# Patient Record
Sex: Female | Born: 1960 | Race: White | Hispanic: No | Smoking: Never smoker
Health system: Southern US, Community
[De-identification: ages and names within clinical notes are randomized; demographics above are authoritative.]

## PROBLEM LIST (undated history)

## (undated) DIAGNOSIS — C50919 Malignant neoplasm of unspecified site of unspecified female breast: Secondary | ICD-10-CM

## (undated) DIAGNOSIS — N200 Calculus of kidney: Secondary | ICD-10-CM

## (undated) DIAGNOSIS — M25561 Pain in right knee: Secondary | ICD-10-CM

## (undated) DIAGNOSIS — K8020 Calculus of gallbladder without cholecystitis without obstruction: Secondary | ICD-10-CM

## (undated) DIAGNOSIS — D6851 Activated protein C resistance: Secondary | ICD-10-CM

## (undated) DIAGNOSIS — M1990 Unspecified osteoarthritis, unspecified site: Secondary | ICD-10-CM

## (undated) DIAGNOSIS — E7800 Pure hypercholesterolemia, unspecified: Secondary | ICD-10-CM

## (undated) DIAGNOSIS — K5900 Constipation, unspecified: Secondary | ICD-10-CM

## (undated) DIAGNOSIS — F99 Mental disorder, not otherwise specified: Secondary | ICD-10-CM

## (undated) DIAGNOSIS — Z8041 Family history of malignant neoplasm of ovary: Secondary | ICD-10-CM

## (undated) DIAGNOSIS — F319 Bipolar disorder, unspecified: Secondary | ICD-10-CM

## (undated) DIAGNOSIS — R011 Cardiac murmur, unspecified: Secondary | ICD-10-CM

## (undated) DIAGNOSIS — L409 Psoriasis, unspecified: Secondary | ICD-10-CM

## (undated) DIAGNOSIS — Z853 Personal history of malignant neoplasm of breast: Secondary | ICD-10-CM

## (undated) DIAGNOSIS — R87619 Unspecified abnormal cytological findings in specimens from cervix uteri: Secondary | ICD-10-CM

## (undated) DIAGNOSIS — M25562 Pain in left knee: Secondary | ICD-10-CM

## (undated) DIAGNOSIS — Z170 Estrogen receptor positive status [ER+]: Secondary | ICD-10-CM

## (undated) DIAGNOSIS — I10 Essential (primary) hypertension: Secondary | ICD-10-CM

## (undated) DIAGNOSIS — Z8742 Personal history of other diseases of the female genital tract: Secondary | ICD-10-CM

## (undated) DIAGNOSIS — IMO0002 Reserved for concepts with insufficient information to code with codable children: Secondary | ICD-10-CM

## (undated) DIAGNOSIS — N649 Disorder of breast, unspecified: Secondary | ICD-10-CM

## (undated) DIAGNOSIS — D219 Benign neoplasm of connective and other soft tissue, unspecified: Secondary | ICD-10-CM

## (undated) DIAGNOSIS — R87629 Unspecified abnormal cytological findings in specimens from vagina: Secondary | ICD-10-CM

## (undated) DIAGNOSIS — K760 Fatty (change of) liver, not elsewhere classified: Principal | ICD-10-CM

## (undated) DIAGNOSIS — K219 Gastro-esophageal reflux disease without esophagitis: Secondary | ICD-10-CM

## (undated) DIAGNOSIS — Z7901 Long term (current) use of anticoagulants: Secondary | ICD-10-CM

## (undated) DIAGNOSIS — I82629 Acute embolism and thrombosis of deep veins of unspecified upper extremity: Secondary | ICD-10-CM

## (undated) DIAGNOSIS — D689 Coagulation defect, unspecified: Principal | ICD-10-CM

## (undated) HISTORY — DX: Cardiac murmur, unspecified: R01.1

## (undated) HISTORY — DX: Acute embolism and thrombosis of deep veins of unspecified upper extremity: I82.629

## (undated) HISTORY — DX: Unspecified abnormal cytological findings in specimens from cervix uteri: R87.619

## (undated) HISTORY — DX: Gastro-esophageal reflux disease without esophagitis: K21.9

## (undated) HISTORY — DX: Mental disorder, not otherwise specified: F99

## (undated) HISTORY — DX: Calculus of kidney: N20.0

## (undated) HISTORY — DX: Personal history of other diseases of the female genital tract: Z87.42

## (undated) HISTORY — DX: Essential (primary) hypertension: I10

## (undated) HISTORY — DX: Malignant neoplasm of unspecified site of unspecified female breast: C50.919

## (undated) HISTORY — DX: Pain in right knee: M25.561

## (undated) HISTORY — DX: Coagulation defect, unspecified: D68.9

## (undated) HISTORY — DX: Calculus of gallbladder without cholecystitis without obstruction: K80.20

## (undated) HISTORY — DX: Psoriasis, unspecified: L40.9

## (undated) HISTORY — PX: MASTECTOMY: SHX3

## (undated) HISTORY — PX: BREAST LUMPECTOMY: SHX2

## (undated) HISTORY — DX: Personal history of malignant neoplasm of breast: Z85.3

## (undated) HISTORY — DX: Long term (current) use of anticoagulants: Z79.01

## (undated) HISTORY — DX: Bipolar disorder, unspecified: F31.9

## (undated) HISTORY — DX: Disorder of breast, unspecified: N64.9

## (undated) HISTORY — DX: Unspecified osteoarthritis, unspecified site: M19.90

## (undated) HISTORY — PX: TONSILLECTOMY AND ADENOIDECTOMY: SHX28

## (undated) HISTORY — DX: Constipation, unspecified: K59.00

## (undated) HISTORY — DX: Benign neoplasm of connective and other soft tissue, unspecified: D21.9

## (undated) HISTORY — DX: Pain in right knee: M25.562

## (undated) HISTORY — PX: LYMPH NODE BIOPSY: SHX201

## (undated) HISTORY — DX: Fatty (change of) liver, not elsewhere classified: K76.0

## (undated) HISTORY — DX: Estrogen receptor positive status (ER+): Z17.0

## (undated) HISTORY — DX: Activated protein C resistance: D68.51

## (undated) HISTORY — PX: OTHER SURGICAL HISTORY: SHX169

## (undated) HISTORY — DX: Pure hypercholesterolemia, unspecified: E78.00

## (undated) HISTORY — DX: Unspecified abnormal cytological findings in specimens from vagina: R87.629

## (undated) HISTORY — DX: Family history of malignant neoplasm of ovary: Z80.41

## (undated) HISTORY — DX: Reserved for concepts with insufficient information to code with codable children: IMO0002

---

## 2001-06-18 ENCOUNTER — Encounter: Payer: Self-pay | Admitting: Orthopaedic Surgery

## 2001-06-18 ENCOUNTER — Ambulatory Visit (HOSPITAL_COMMUNITY): Admission: RE | Admit: 2001-06-18 | Discharge: 2001-06-18 | Payer: Self-pay | Admitting: Orthopaedic Surgery

## 2005-04-14 ENCOUNTER — Ambulatory Visit (HOSPITAL_COMMUNITY): Admission: RE | Admit: 2005-04-14 | Discharge: 2005-04-14 | Payer: Self-pay | Admitting: Obstetrics and Gynecology

## 2006-04-27 ENCOUNTER — Encounter: Admission: RE | Admit: 2006-04-27 | Discharge: 2006-04-27 | Payer: Self-pay | Admitting: Rheumatology

## 2006-07-26 ENCOUNTER — Ambulatory Visit (HOSPITAL_COMMUNITY): Admission: RE | Admit: 2006-07-26 | Discharge: 2006-07-26 | Payer: Self-pay | Admitting: Obstetrics and Gynecology

## 2006-09-19 ENCOUNTER — Ambulatory Visit (HOSPITAL_BASED_OUTPATIENT_CLINIC_OR_DEPARTMENT_OTHER): Admission: RE | Admit: 2006-09-19 | Discharge: 2006-09-19 | Payer: Self-pay | Admitting: *Deleted

## 2006-09-19 ENCOUNTER — Encounter (INDEPENDENT_AMBULATORY_CARE_PROVIDER_SITE_OTHER): Payer: Self-pay | Admitting: Specialist

## 2007-06-25 ENCOUNTER — Encounter: Admission: RE | Admit: 2007-06-25 | Discharge: 2007-06-25 | Payer: Self-pay | Admitting: Obstetrics and Gynecology

## 2007-06-27 ENCOUNTER — Encounter: Admission: RE | Admit: 2007-06-27 | Discharge: 2007-06-27 | Payer: Self-pay | Admitting: Family Medicine

## 2007-06-27 ENCOUNTER — Encounter (INDEPENDENT_AMBULATORY_CARE_PROVIDER_SITE_OTHER): Payer: Self-pay | Admitting: Diagnostic Radiology

## 2007-07-03 ENCOUNTER — Encounter: Admission: RE | Admit: 2007-07-03 | Discharge: 2007-07-03 | Payer: Self-pay | Admitting: Obstetrics and Gynecology

## 2007-07-03 ENCOUNTER — Encounter: Admission: RE | Admit: 2007-07-03 | Discharge: 2007-07-03 | Payer: Self-pay | Admitting: Surgery

## 2007-07-09 ENCOUNTER — Ambulatory Visit (HOSPITAL_COMMUNITY): Payer: Self-pay | Admitting: Oncology

## 2007-07-11 ENCOUNTER — Ambulatory Visit (HOSPITAL_COMMUNITY): Admission: RE | Admit: 2007-07-11 | Discharge: 2007-07-11 | Payer: Self-pay | Admitting: Oncology

## 2007-07-11 ENCOUNTER — Encounter (HOSPITAL_COMMUNITY): Admission: RE | Admit: 2007-07-11 | Discharge: 2007-08-10 | Payer: Self-pay | Admitting: Oncology

## 2007-07-11 ENCOUNTER — Ambulatory Visit: Payer: Self-pay | Admitting: Internal Medicine

## 2007-07-15 ENCOUNTER — Ambulatory Visit (HOSPITAL_BASED_OUTPATIENT_CLINIC_OR_DEPARTMENT_OTHER): Admission: RE | Admit: 2007-07-15 | Discharge: 2007-07-15 | Payer: Self-pay | Admitting: Surgery

## 2007-07-29 ENCOUNTER — Emergency Department (HOSPITAL_COMMUNITY): Admission: EM | Admit: 2007-07-29 | Discharge: 2007-07-30 | Payer: Self-pay | Admitting: Emergency Medicine

## 2007-08-13 ENCOUNTER — Encounter (HOSPITAL_COMMUNITY): Admission: RE | Admit: 2007-08-13 | Discharge: 2007-09-12 | Payer: Self-pay | Admitting: Oncology

## 2007-08-27 ENCOUNTER — Ambulatory Visit: Payer: Self-pay | Admitting: Oncology

## 2007-08-28 ENCOUNTER — Ambulatory Visit (HOSPITAL_COMMUNITY): Payer: Self-pay | Admitting: Oncology

## 2007-08-29 ENCOUNTER — Ambulatory Visit (HOSPITAL_COMMUNITY): Admission: RE | Admit: 2007-08-29 | Discharge: 2007-08-29 | Payer: Self-pay | Admitting: Obstetrics and Gynecology

## 2007-09-13 ENCOUNTER — Encounter (HOSPITAL_COMMUNITY): Admission: RE | Admit: 2007-09-13 | Discharge: 2007-10-09 | Payer: Self-pay | Admitting: Oncology

## 2007-09-25 ENCOUNTER — Ambulatory Visit: Payer: Self-pay | Admitting: Cardiology

## 2007-12-02 ENCOUNTER — Ambulatory Visit (HOSPITAL_COMMUNITY): Admission: RE | Admit: 2007-12-02 | Discharge: 2007-12-02 | Payer: Self-pay | Admitting: Family Medicine

## 2007-12-23 ENCOUNTER — Ambulatory Visit: Admission: RE | Admit: 2007-12-23 | Discharge: 2008-03-22 | Payer: Self-pay | Admitting: Radiation Oncology

## 2008-05-08 ENCOUNTER — Observation Stay (HOSPITAL_COMMUNITY): Admission: EM | Admit: 2008-05-08 | Discharge: 2008-05-10 | Payer: Self-pay | Admitting: Emergency Medicine

## 2008-06-01 ENCOUNTER — Ambulatory Visit (HOSPITAL_COMMUNITY): Admission: RE | Admit: 2008-06-01 | Discharge: 2008-06-01 | Payer: Self-pay | Admitting: Family Medicine

## 2008-06-02 ENCOUNTER — Encounter (HOSPITAL_COMMUNITY): Admission: RE | Admit: 2008-06-02 | Discharge: 2008-07-02 | Payer: Self-pay | Admitting: Family Medicine

## 2008-08-18 IMAGING — RF DG FLUORO RM 1-60 MIN
15 series · 15 of 15 positions shown · non-contrast
Comparison: Chest radiographs 07/31/07 and CT of the chest 07/30/07.

CLINICAL DATA: Breast cancer.  Inability to access port-a-cath.  
 DIAGNOSTIC FLUOROSCOPY AND PORT-A-CATH INJECTION:

[Series 1: run · 1 of 1 slices shown (1 of 15)]
[im 1/1]
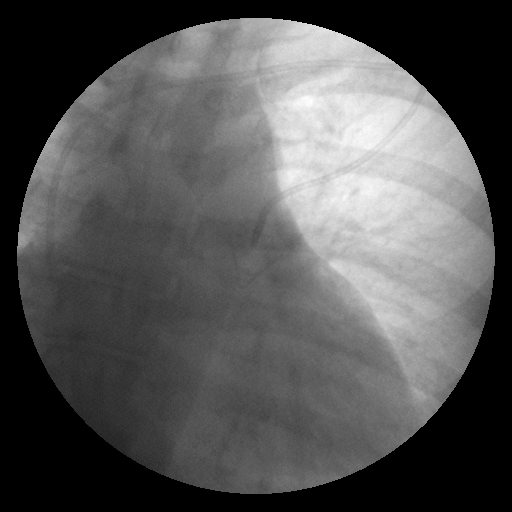

[Series 2: run · 1 of 1 slices shown (2 of 15)]
[im 1/1]
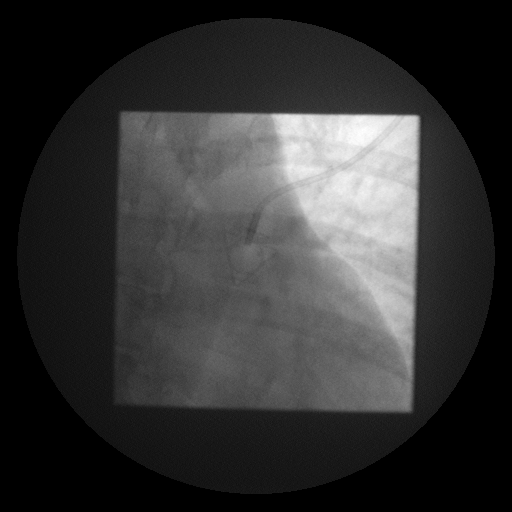

[Series 3: run · 1 of 1 slices shown (3 of 15)]
[im 1/1]
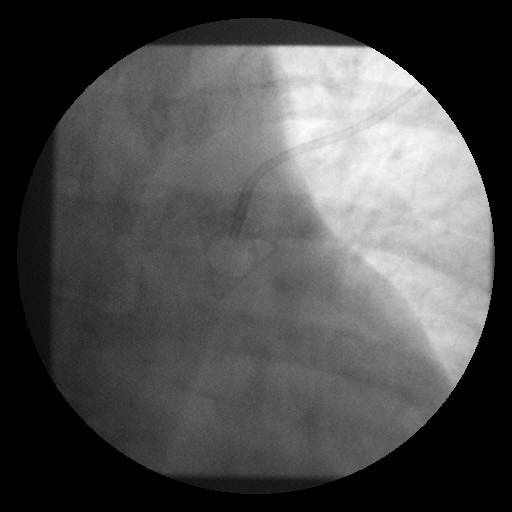

[Series 4: run · 1 of 1 slices shown (4 of 15)]
[im 1/1]
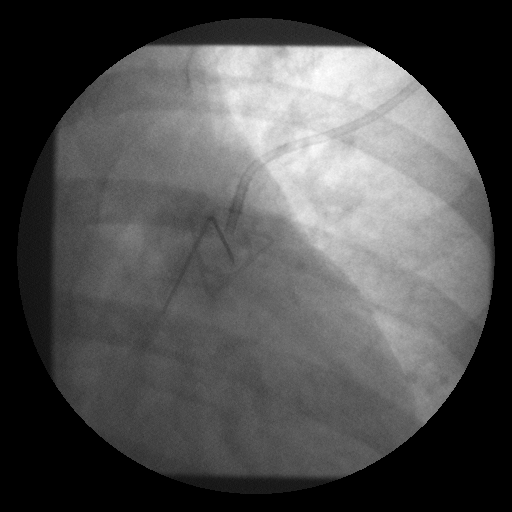

[Series 5: run · 1 of 1 slices shown (5 of 15)]
[im 1/1]
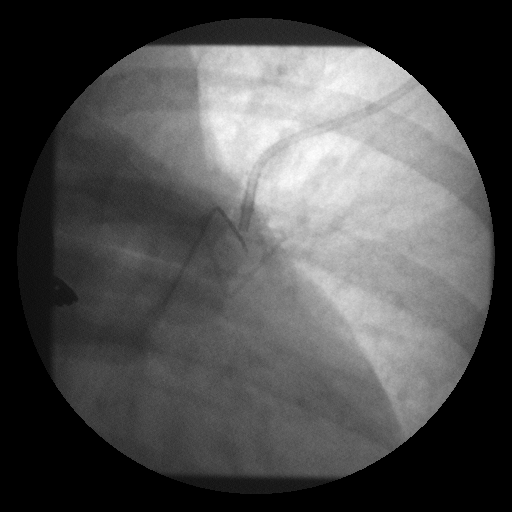

[Series 6: run · 1 of 1 slices shown (6 of 15)]
[im 1/1]
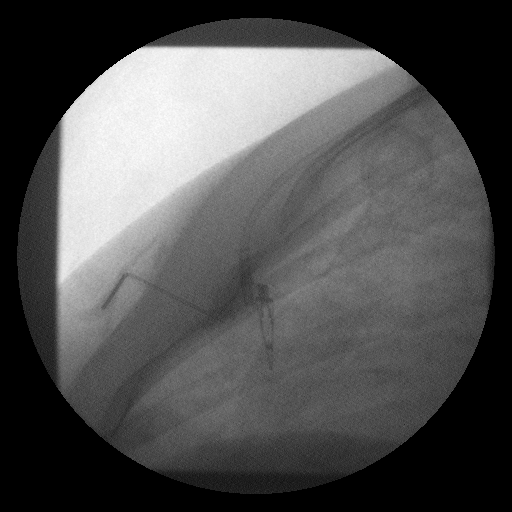

[Series 7: run · 1 of 1 slices shown (7 of 15)]
[im 1/1]
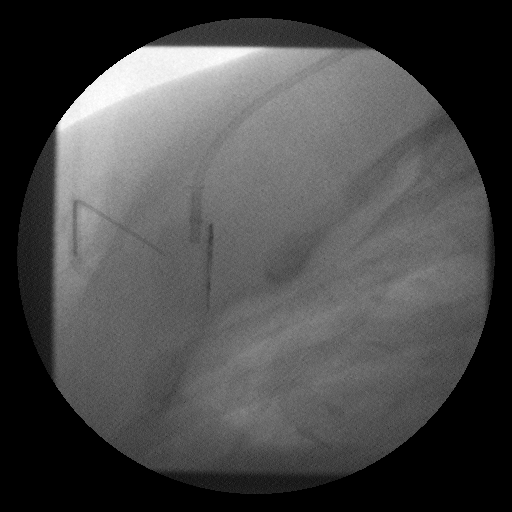

[Series 8: run · 1 of 1 slices shown (8 of 15)]
[im 1/1]
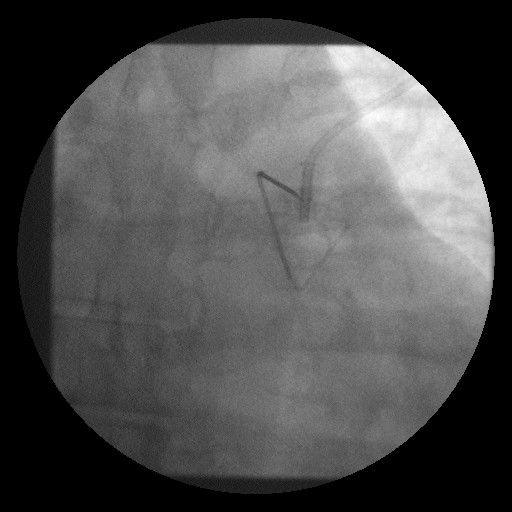

[Series 9: run · 1 of 1 slices shown (9 of 15)]
[im 1/1]
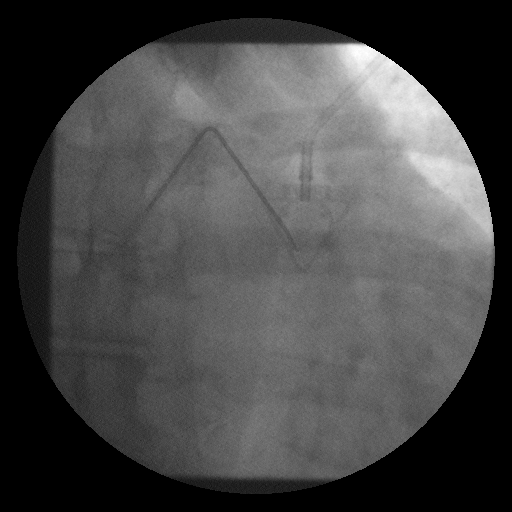

[Series 10: run · 1 of 1 slices shown (10 of 15)]
[im 1/1]
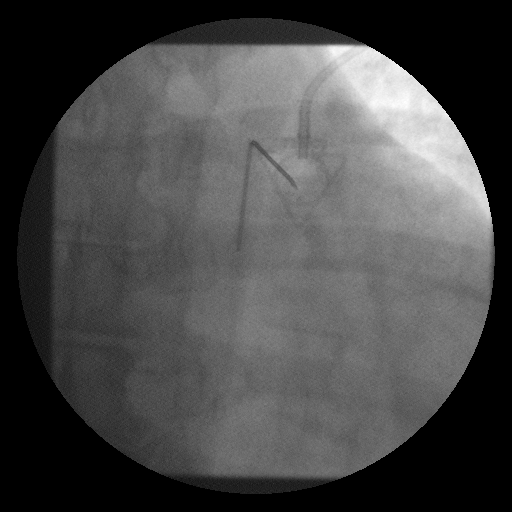

[Series 11: run · 1 of 1 slices shown (11 of 15)]
[im 1/1]
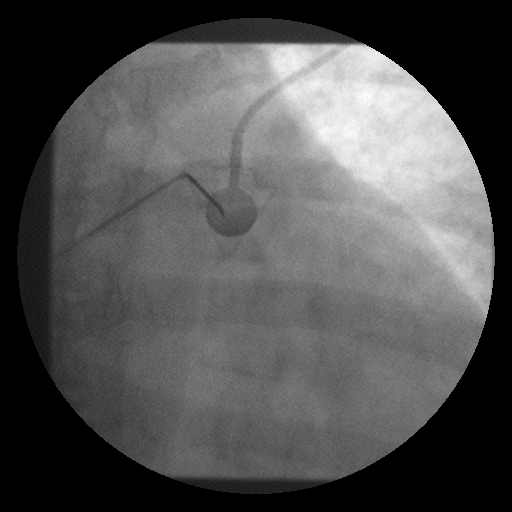

[Series 12: run · 1 of 1 slices shown (12 of 15)]
[im 1/1]
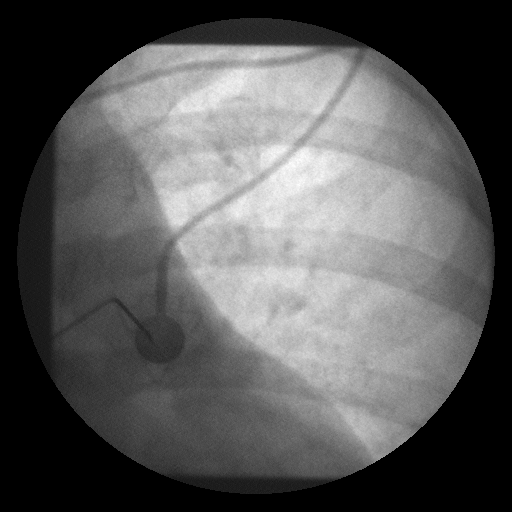

[Series 13: run · 1 of 1 slices shown (13 of 15)]
[im 1/1]
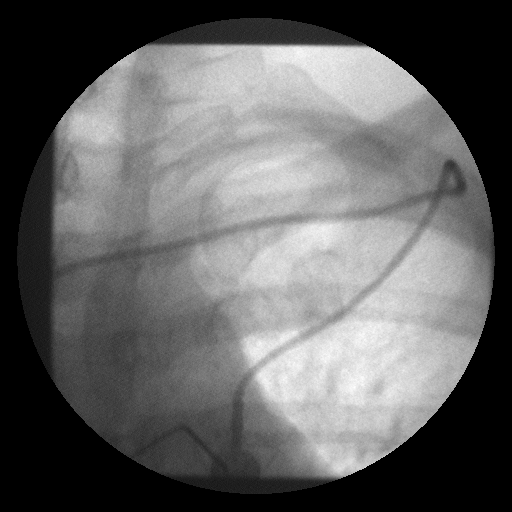

[Series 14: run · 1 of 1 slices shown (14 of 15)]
[im 1/1]
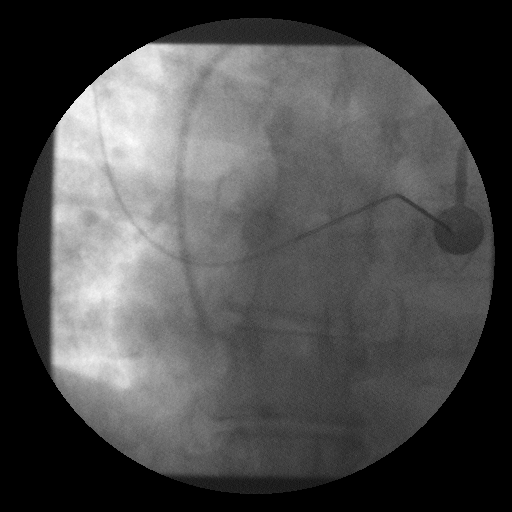

[Series 15: run · 1 of 1 slices shown (15 of 15)]
[im 1/1]
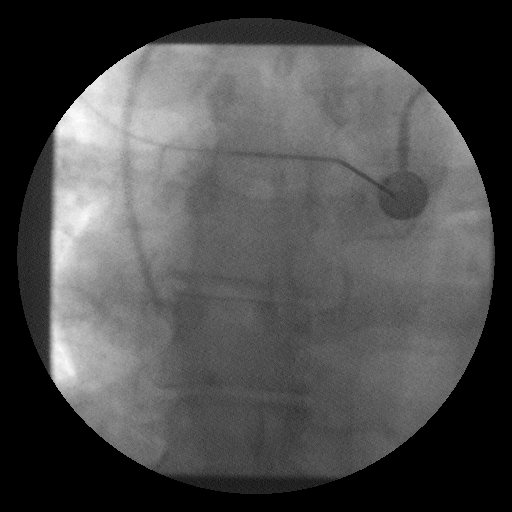

[15 of 15 positions shown; findings below may reference images not displayed]

FINDINGS: Initial fluoroscopic assessment of the port medially in the left breast demonstrates no change in its alignment compared with the recent prior examinations.  The oncology nurses subsequently attempted accessing the port with the standard needle.  Fluoroscopic guidance was provided.  Despite the needle appearing to be in good position in the AP plane, blood could not be aspirated from the port.  The patient was placed in the right lateral decubitus position.  This shows the tip of the needle along the anterior aspect of the port, incompletely penetrating the expected location of the port diaphragm.  At this point, it was determined that the needle length was insufficient to access the port.  The nurses then obtained a 1 and ? inch needle and reaccessed the port sterilely.  Access with this needle, there was blood return.  Approximately 5 cc of Renografin 60 was subsequently instilled through the port, demonstrating no leak at the injection site or from the tubing.  The tip of the catheter is in the superior right atrium with the patient supine.  No fibrin sheath is seen.
IMPRESSION: Successful access of port medially in the left breast using a 1 ? inch access needle.  Subsequent injection demonstrates no leak at the access site or along the port tubing.

## 2008-12-03 ENCOUNTER — Other Ambulatory Visit: Admission: RE | Admit: 2008-12-03 | Discharge: 2008-12-03 | Payer: Self-pay | Admitting: Obstetrics and Gynecology

## 2009-01-29 ENCOUNTER — Ambulatory Visit: Payer: Self-pay | Admitting: Gastroenterology

## 2009-02-01 DIAGNOSIS — K5900 Constipation, unspecified: Secondary | ICD-10-CM | POA: Insufficient documentation

## 2009-02-11 ENCOUNTER — Encounter: Payer: Self-pay | Admitting: Gastroenterology

## 2009-02-11 ENCOUNTER — Ambulatory Visit: Payer: Self-pay | Admitting: Orthopedic Surgery

## 2009-02-11 ENCOUNTER — Ambulatory Visit (HOSPITAL_COMMUNITY): Admission: RE | Admit: 2009-02-11 | Discharge: 2009-02-11 | Payer: Self-pay | Admitting: Orthopedic Surgery

## 2009-02-11 DIAGNOSIS — M25569 Pain in unspecified knee: Secondary | ICD-10-CM | POA: Insufficient documentation

## 2009-02-11 DIAGNOSIS — M7580 Other shoulder lesions, unspecified shoulder: Secondary | ICD-10-CM

## 2009-02-11 DIAGNOSIS — M25519 Pain in unspecified shoulder: Secondary | ICD-10-CM | POA: Insufficient documentation

## 2009-02-12 ENCOUNTER — Ambulatory Visit: Payer: Self-pay | Admitting: Orthopedic Surgery

## 2009-02-17 ENCOUNTER — Encounter: Payer: Self-pay | Admitting: Orthopedic Surgery

## 2009-02-22 ENCOUNTER — Encounter: Payer: Self-pay | Admitting: Orthopedic Surgery

## 2009-03-01 ENCOUNTER — Encounter (HOSPITAL_COMMUNITY): Admission: RE | Admit: 2009-03-01 | Discharge: 2009-03-31 | Payer: Self-pay | Admitting: Orthopedic Surgery

## 2009-03-01 ENCOUNTER — Encounter: Payer: Self-pay | Admitting: Orthopedic Surgery

## 2009-03-02 ENCOUNTER — Encounter: Payer: Self-pay | Admitting: Orthopedic Surgery

## 2009-03-30 ENCOUNTER — Encounter: Payer: Self-pay | Admitting: Orthopedic Surgery

## 2009-04-06 ENCOUNTER — Ambulatory Visit (HOSPITAL_COMMUNITY): Admission: RE | Admit: 2009-04-06 | Discharge: 2009-04-06 | Payer: Self-pay | Admitting: Obstetrics and Gynecology

## 2009-04-09 ENCOUNTER — Encounter: Payer: Self-pay | Admitting: Orthopedic Surgery

## 2009-04-16 ENCOUNTER — Encounter: Payer: Self-pay | Admitting: Gastroenterology

## 2009-05-04 ENCOUNTER — Emergency Department (HOSPITAL_COMMUNITY): Admission: EM | Admit: 2009-05-04 | Discharge: 2009-05-04 | Payer: Self-pay | Admitting: Emergency Medicine

## 2009-10-28 ENCOUNTER — Ambulatory Visit: Payer: Self-pay | Admitting: Orthopedic Surgery

## 2009-10-28 DIAGNOSIS — M79609 Pain in unspecified limb: Secondary | ICD-10-CM | POA: Insufficient documentation

## 2009-10-28 DIAGNOSIS — M1710 Unilateral primary osteoarthritis, unspecified knee: Secondary | ICD-10-CM

## 2009-10-29 ENCOUNTER — Telehealth: Payer: Self-pay | Admitting: Orthopedic Surgery

## 2009-11-02 ENCOUNTER — Ambulatory Visit (HOSPITAL_COMMUNITY): Admission: RE | Admit: 2009-11-02 | Discharge: 2009-11-02 | Payer: Self-pay | Admitting: Orthopedic Surgery

## 2009-11-03 ENCOUNTER — Telehealth: Payer: Self-pay | Admitting: Orthopedic Surgery

## 2009-11-08 ENCOUNTER — Ambulatory Visit: Payer: Self-pay | Admitting: Orthopedic Surgery

## 2009-11-08 DIAGNOSIS — IMO0002 Reserved for concepts with insufficient information to code with codable children: Secondary | ICD-10-CM | POA: Insufficient documentation

## 2009-12-11 ENCOUNTER — Inpatient Hospital Stay (HOSPITAL_COMMUNITY): Admission: EM | Admit: 2009-12-11 | Discharge: 2009-12-14 | Payer: Self-pay | Admitting: Emergency Medicine

## 2009-12-23 ENCOUNTER — Other Ambulatory Visit: Admission: RE | Admit: 2009-12-23 | Discharge: 2009-12-23 | Payer: Self-pay | Admitting: Obstetrics and Gynecology

## 2009-12-29 ENCOUNTER — Ambulatory Visit (HOSPITAL_COMMUNITY): Admission: RE | Admit: 2009-12-29 | Discharge: 2009-12-29 | Payer: Self-pay | Admitting: Obstetrics & Gynecology

## 2009-12-31 ENCOUNTER — Ambulatory Visit (HOSPITAL_COMMUNITY): Admission: RE | Admit: 2009-12-31 | Discharge: 2009-12-31 | Payer: Self-pay | Admitting: Obstetrics & Gynecology

## 2010-02-14 ENCOUNTER — Ambulatory Visit: Payer: Self-pay | Admitting: Orthopedic Surgery

## 2010-02-14 DIAGNOSIS — M654 Radial styloid tenosynovitis [de Quervain]: Secondary | ICD-10-CM | POA: Insufficient documentation

## 2010-08-17 ENCOUNTER — Emergency Department (HOSPITAL_COMMUNITY): Admission: EM | Admit: 2010-08-17 | Discharge: 2010-08-17 | Payer: Self-pay | Admitting: Emergency Medicine

## 2010-09-21 ENCOUNTER — Encounter: Payer: Self-pay | Admitting: Orthopedic Surgery

## 2010-09-22 ENCOUNTER — Ambulatory Visit: Payer: Self-pay | Admitting: Orthopedic Surgery

## 2010-09-22 DIAGNOSIS — M129 Arthropathy, unspecified: Secondary | ICD-10-CM

## 2010-10-04 ENCOUNTER — Ambulatory Visit (HOSPITAL_COMMUNITY): Payer: Self-pay | Admitting: Psychiatry

## 2010-10-07 ENCOUNTER — Ambulatory Visit (HOSPITAL_COMMUNITY): Admit: 2010-10-07 | Payer: Self-pay | Admitting: Psychology

## 2010-10-25 ENCOUNTER — Ambulatory Visit (HOSPITAL_COMMUNITY)
Admission: RE | Admit: 2010-10-25 | Discharge: 2010-10-25 | Payer: Self-pay | Source: Home / Self Care | Attending: Psychiatry | Admitting: Psychiatry

## 2010-10-31 ENCOUNTER — Ambulatory Visit (HOSPITAL_COMMUNITY)
Admission: RE | Admit: 2010-10-31 | Discharge: 2010-10-31 | Payer: Self-pay | Source: Home / Self Care | Attending: Psychiatry | Admitting: Psychiatry

## 2010-11-07 ENCOUNTER — Ambulatory Visit (HOSPITAL_COMMUNITY)
Admission: RE | Admit: 2010-11-07 | Discharge: 2010-11-07 | Payer: Self-pay | Source: Home / Self Care | Attending: Psychiatry | Admitting: Psychiatry

## 2010-11-08 NOTE — Progress Notes (Signed)
Summary: MRI appointment  Phone Note Outgoing Call   Call placed by: Waldon Reining,  October 29, 2009 10:39 AM Call placed to: Patient Action Taken: Appt scheduled Summary of Call: I called to give patient her MRI appointment at [facility] on 11-02-09 at 8:30. Patient is aware. Patient has 100% [employer] discount. Patient will follow up here for results.

## 2010-11-08 NOTE — Assessment & Plan Note (Signed)
Summary: LT KNEE,PAIN DOWNW.TO FOOT/NEEDS XRAY/100%M.C.DISC/CAF   Visit Type:  Follow-up Primary Provider:  Cyril Mourning, NP-C  CC:  left knee pain.  History of Present Illness: This is a 50 year old female who we previously saw for osteoarthritis of the knee presents back with LEFT knee pain.  In the past she has had injections and has had Synvisc injection in the LEFT knee without relief.  She continues complaining of fairly severe and worsening medial LEFT knee pain but without any major mechanical symptoms.  Her pain is unrelieved by Lidoderm patches and ibuprofen.  She notes that the knee is getting worse and she cannot negotiate the steps.  Review of systems reveals that she has pain in both feet but no numbness she has back pain.  Her foot pain increases when she is standing and it is bilateral.    Allergies: 1)  ! Ceftin 2)  ! Augmentin  Past History:  Past Medical History: Anxiety Disorder Hypertension Irritable Bowel Syndrome-c Colon Polyps Breast Cancer double mastectomy Hyperlipidemia Kidney Stones Abnl PAP TCS 2009-Dr. Karilyn Cota  Past Surgical History: Breast-Mastectomy, left 2008 bilateral mastectomy. BSO Cryoablation of the cervix  Family History: Reviewed history from 01/29/2009 and no changes required. Dad had polyps. No FH of Colon Cancer  Social History: Reviewed history from 01/29/2009 and no changes required. Occupation: unemployed, applying for disability Tobacco/Alcohol Use - no  Review of Systems       bottom of your feet  pain when standing (see neurologist)  Physical Exam  Additional Exam:  General appearance normal  Oriented x3  Mood and affect normal  Gait and station abnormal favors the LEFT leg with a limp  LEFT knee inspection medial joint line tenderness, medial bursal tenderness.  Range of motion 120  Stability: ACL PCL normal collateral ligaments normal  Strength: Motor exam LEFT knee thigh muscles 5 over  5  Skin normal.  Cardiovascular pulse and temperature normal.  Lymph nodes negative.  Sensation normal.  Reflexes normal.  Coordination bowels normal.   Impression & Recommendations:  Problem # 1:  KNEE PAIN (ICD-719.46) Assessment Unchanged  x-rays show that she has medial compartment arthritis.  His moderate.  Impression medial compartment arthritis.  I think the patient will need knee replacement surgery. , I think based on her age and symptoms an MRI should be done to rule out a meniscal problem as a source of pain because that can be done arthroscopically and can delay knee replacement surgery   The following medications were removed from the medication list:    Vicodin Es 7.5-750 Mg Tabs (Hydrocodone-acetaminophen) .Marland Kitchen... As needed Her updated medication list for this problem includes:    Ibuprofen 800 Mg Tabs (Ibuprofen) .Marland Kitchen... As needed  Orders: Est. Patient Level IV (04540)  Problem # 2:  KNEE, ARTHRITIS, DEGEN./OSTEO (ICD-715.96) Assessment: Unchanged  The following medications were removed from the medication list:    Vicodin Es 7.5-750 Mg Tabs (Hydrocodone-acetaminophen) .Marland Kitchen... As needed Her updated medication list for this problem includes:    Ibuprofen 800 Mg Tabs (Ibuprofen) .Marland Kitchen... As needed  Orders: Est. Patient Level IV (98119)  Other Orders: Neurology Referral (Neuro)  Patient Instructions: 1)  Consult Neurologist pain feet with standing  2)  MRI left knee  3)  return with MR

## 2010-11-08 NOTE — Assessment & Plan Note (Signed)
Summary: mri results bringing disc/mc discount./bsf   Visit Type:  Follow-up Primary Provider:  Cyril Mourning, NP-C  CC:  left knee pain.  History of Present Illness:   This is a 50 year old female who we previously saw for osteoarthritis of the knee presents back with LEFT knee pain.  In the past she has had injections and has had Synvisc injection in the LEFT knee without relief.  She continues complaining of fairly severe and worsening medial LEFT knee pain but without any major mechanical symptoms.  Her pain is unrelieved by Lidoderm patches and ibuprofen.  She notes that the knee is getting worse and she cannot negotiate the steps.  Review of systems reveals that she has pain in both feet but no numbness she has back pain.  Her foot pain increases when she is standing and it is bilateral.  MRI results.  IMPRESSION:   1.  Tricompartmental degenerative changes as described, most advanced medially. 2.  Multiple bone infarcts. 3.  Prominent medial meniscal extrusion with degenerative free edge tearing of the posterior horn and body.  There is associated MCL degeneration. 4.  No acute ligamentous findings.   Read By:  Gerrianne Scale,  M.D.     Allergies: 1)  ! Ceftin 2)  ! Augmentin   Impression & Recommendations:  Problem # 1:  KNEE, ARTHRITIS, DEGEN./OSTEO (ICD-715.96) Assessment Comment Only  MRI review  She has arthritis 3 compartments torn medial meniscus.  She does not want to have surgery right now she wants to go and have her cancer checkups which is fine  I gave her some patient education materials from the [facility] regarding meniscal tear and surgical arthroscopy.  She will need knee replacement 35 years down the line I think she can get some symptomatic relief from arthroscopic surgery to address the torn meniscus  Her updated medication list for this problem includes:    Ibuprofen 800 Mg Tabs (Ibuprofen) .Marland Kitchen... As needed  Problem # 2:  MEDIAL  MENISCUS TEAR (ICD-836.0) Assessment: Comment Only  Orders: Est. Patient Level II (27782)  Patient Instructions: 1)  followup per her phone call

## 2010-11-08 NOTE — Assessment & Plan Note (Signed)
Summary: left wrist pain needs xray/medicaid/mc discount/medicare.cbt   Vital Signs:  Patient profile:   50 year old female Height:      62 inches Weight:      206 pounds Pulse rate:   78 / minute Resp:     16 per minute  Vitals Entered By: Fuller Canada MD (Feb 14, 2010 8:46 AM)  Visit Type:  new problem Referring Provider:  self Primary Provider:  Cyril Mourning, NP-C  CC:  left wrist pain.  History of Present Illness: I saw Jessica Reilly in the office today for a followup visit.  She is a 50 years old woman with the complaint of:  left wrist pain.  No injury.  Xrays today in our office.  Meds Toprol XL, Nexium, Xanax, Zocor, Ibuprofen, Lidoderm patches, Stool softener, Arimidex.  She complains of pain and swelling over the LEFT thumb extensor tendons and wrist which is become fairly severe, not associated with any trauma, associated with clicking and nodular consistency of the first compartment of the thumb.    Allergies: 1)  ! Ceftin 2)  ! Augmentin  Past History:  Past Medical History: Last updated: 10/28/2009 Anxiety Disorder Hypertension Irritable Bowel Syndrome-c Colon Polyps Breast Cancer double mastectomy Hyperlipidemia Kidney Stones Abnl PAP TCS 2009-Dr. Karilyn Cota  Past Surgical History: Last updated: 10/28/2009 Breast-Mastectomy, left 2008 bilateral mastectomy. BSO Cryoablation of the cervix  Family History: Last updated: 02/14/2010 Dad had polyps. No FH of Colon Cancer FH of Cancer:  Family History Coronary Heart Disease female < 31 Hx, family, chronic respiratory condition Hx, family, asthma  Social History: Last updated: 02/14/2010 Occupation: unemployed, applying for disability divorced Tobacco/Alcohol Use - no no caffeine use  Family History: Dad had polyps. No FH of Colon Cancer FH of Cancer:  Family History Coronary Heart Disease female < 40 Hx, family, chronic respiratory condition Hx, family, asthma  Social  History: Occupation: unemployed, applying for disability divorced Tobacco/Alcohol Use - no no caffeine use  Review of Systems Gastrointestinal:  Complains of nausea and constipation; denies heartburn, vomiting, diarrhea, and blood in your stools. Musculoskeletal:  Complains of joint pain, swelling, instability, stiffness, heat, and muscle pain; denies redness. Psychiatric:  Complains of nervousness, depression, and anxiety; denies hallucinations. HEENT:  Denies blurred or double vision, eye pain, redness, and watering; headaches. Immunology:  Complains of seasonal allergies; denies sinus problems and allergic to bee stings.  The review of systems is negative for Constitutional, Cardiovascular, Respiratory, Genitourinary, Neurologic, Endocrine, Skin, and Hemoatologic.  Physical Exam  Additional Exam:  VS reviewed are stable  GENERAL: Appearance is normal   CDV: normal pulse and temperature   Skin: was normal   Neuro: sensation was normal   MSK LEFT wrist tenderness over the extensor tendons with swelling in the wrist and the extensor tendons of the thumb.  Range of motion is limited by pain especially and ulnar deviation.  Motor exam is normal joint is stable.  Finkelstein's test is positive.   Impression & Recommendations:  Problem # 1:  DEQUERVAIN'S (ICD-727.04) Assessment New  3 views LEFT wrist  There is a little perhaps spur at the waist of the scaphoid but the joint spaces are normal there's no fracture no bony lesions  Impression normal wrist joint  De Quervain's syndrome we will treat her with a normal protocol  Orders: Est. Patient Level IV (16109) Wrist x-ray complete, minimum 3 views (60454)  Patient Instructions: 1)  Tendonitis  2)  wear splint 6 weeks  3)  continue ibuprofen 3 x  aday  4)  apply ice three times a day  5)  return as needed

## 2010-11-08 NOTE — Letter (Signed)
Summary: Handicapped placard permanent  Handicapped placard permanent   Imported By: Jacklynn Ganong 10/28/2009 11:32:55  _____________________________________________________________________  External Attachment:    Type:   Image     Comment:   External Document

## 2010-11-08 NOTE — Progress Notes (Signed)
Summary: Neurology referral.  Phone Note Outgoing Call   Call placed by: Waldon Reining,  November 03, 2009 3:30 PM Call placed to: Specialist Action Taken: Information Sent Summary of Call: I faxed a referral for this patient to Dr. Gerilyn Pilgrim to be seen for foot pain.

## 2010-11-08 NOTE — Letter (Signed)
Summary: History form  History form   Imported By: Jacklynn Ganong 02/16/2010 15:25:48  _____________________________________________________________________  External Attachment:    Type:   Image     Comment:   External Document

## 2010-11-10 ENCOUNTER — Encounter (INDEPENDENT_AMBULATORY_CARE_PROVIDER_SITE_OTHER): Payer: BC Managed Care – PPO | Admitting: Psychiatry

## 2010-11-10 ENCOUNTER — Ambulatory Visit (HOSPITAL_COMMUNITY): Admit: 2010-11-10 | Payer: Self-pay | Admitting: Psychiatry

## 2010-11-10 DIAGNOSIS — F3189 Other bipolar disorder: Secondary | ICD-10-CM

## 2010-11-10 NOTE — Assessment & Plan Note (Signed)
Summary: BI KNEE PAIN NEEDS XR/MCR/BCBS/DISCOUNT/BSF   Visit Type:  Follow-up Referring [place] Toft:  self Primary [place] Alfredo:  Cyril Mourning, NP-C  CC:  bilateral knee pain.  History of Present Illness: followup visit status post treatment for bilateral knee pain status post MRI LEFT knee which shows the following MPRESSION:   1.  Tricompartmental degenerative changes as described, most advanced medially. 2.  Multiple bone infarcts. 3.  Prominent medial meniscal extrusion with degenerative free edge tearing of the posterior horn and body.  There is associated MCL degeneration. 4.  No acute ligamentous findings.  presents back today with severe knee pain she's been on our waiting list for several weeks now hurting [name] to get her seen.  Will have new xrays today.  Meds: Nexium, Toprol, Zocor, Ibuprofen, Lidoderm patch, Armidex.=, had Prednisone in the past for poison ivy and this helped her knee pain.  Complaint of bilateral knee pain medially with stiffness aching loss of motion and difficulty getting out of a chair  She reports her pain as 10 out of 10 and it is constant but worse with walking associated with locking catching and swelling.  She describes sharp dull throbbing stabbing pain.    Allergies: 1)  ! Ceftin 2)  ! Augmentin  Past History:  Past Medical History: Last updated: 10/28/2009 Anxiety Disorder Hypertension Irritable Bowel Syndrome-c Colon Polyps Breast Cancer double mastectomy Hyperlipidemia Kidney Stones Abnl PAP TCS 2009-Dr. Karilyn Cota  Past Surgical History: Last updated: 10/28/2009 Breast-Mastectomy, left 2008 bilateral mastectomy. BSO Cryoablation of the cervix  Family History: Last updated: 02/14/2010 Dad had polyps. No FH of Colon Cancer FH of Cancer:  Family History Coronary Heart Disease female < 44 Hx, family, chronic respiratory condition Hx, family, asthma  Social History: Last updated: 02/14/2010 Occupation: unemployed,  applying for disability divorced Tobacco/Alcohol Use - no no caffeine use  Family history reviewed for relevance to current acute and chronic problems. Social history (including risk factors) reviewed for relevance to current acute and chronic problems.  Family History: Reviewed history from 02/14/2010 and no changes required. Dad had polyps. No FH of Colon Cancer FH of Cancer:  Family History Coronary Heart Disease female < 15 Hx, family, chronic respiratory condition Hx, family, asthma  Social History: Reviewed history from 02/14/2010 and no changes required. Occupation: unemployed, applying for disability divorced Tobacco/Alcohol Use - no no caffeine use  Physical Exam  Additional Exam:  This is is a slightly overweight well-developed well-nourished well groomed female who has normal peripheral pulses no lymphadenopathy normal skin normal sensation.  She is awake alert oriented x3  Bilateral knee examination reveals a altered gait pattern  She has medial lateral joint line tenderness but has maintained a reasonable amount of motion of 120 with full extension.  Strength is normal in both knees are stable.   Impression & Recommendations:  Problem # 1:  KNEE, ARTHRITIS, DEGEN./OSTEO (ICD-715.96) Assessment Deteriorated  bilateral radiographs are done 3 views each knee show severe medial joint disease with severe and significant medial joint space narrowing LEFT greater than RIGHT there is surrounding osteophytes and joint effusion  Impression bilateral osteoarthritis of the knee  Bilateral injections were given  We discussed surgical options vs. medication vs. injection she opted for injection  Bilateral knee injections were done  .[email or web address]  Verbal consent was obtained. The left knee was prepped with alcohol and ethyl chloride. 1 cc of depomedrol 40mg /cc and 4 cc of lidocaine 1% was injected. there were no complications.  Her updated medication list for  this  problem includes:    Ibuprofen 800 Mg Tabs (Ibuprofen) .Marland Kitchen... As needed  Other Orders: Est. Patient Level IV (16109) Knees  x-ray bilateral (UEA-54098) Joint Aspirate / Injection, Large (20610) Depo- Medrol 40mg  (J1030)  Patient Instructions: 1)  You have received an injection of cortisone today. You may experience increased pain at the injection site. Apply ice pack to the area for 20 minutes every 2 hours and take 2 xtra strength tylenol every 8 hours. This increased pain will usually resolve in 24 hours. The injection will take effect in 3-10 days.  2)  repeat injections in 3 mos if needed    Orders Added: 1)  Est. Patient Level IV [11914] 2)  Knees  x-ray bilateral [[id number]] 3)  Joint Aspirate / Injection, Large [20610] 4)  Depo- Medrol 40mg  [J1030]

## 2010-11-10 NOTE — Letter (Signed)
Summary: History form  History form   Imported By: Jacklynn Ganong 09/26/2010 14:59:41  _____________________________________________________________________  External Attachment:    Type:   Image     Comment:   External Document

## 2010-11-17 ENCOUNTER — Encounter (INDEPENDENT_AMBULATORY_CARE_PROVIDER_SITE_OTHER): Payer: BC Managed Care – PPO | Admitting: Psychiatry

## 2010-11-17 DIAGNOSIS — F319 Bipolar disorder, unspecified: Secondary | ICD-10-CM

## 2010-11-22 ENCOUNTER — Encounter (INDEPENDENT_AMBULATORY_CARE_PROVIDER_SITE_OTHER): Payer: BC Managed Care – PPO | Admitting: Psychiatry

## 2010-11-22 DIAGNOSIS — F319 Bipolar disorder, unspecified: Secondary | ICD-10-CM

## 2010-11-30 ENCOUNTER — Encounter (INDEPENDENT_AMBULATORY_CARE_PROVIDER_SITE_OTHER): Payer: BC Managed Care – PPO | Admitting: Psychiatry

## 2010-11-30 DIAGNOSIS — F319 Bipolar disorder, unspecified: Secondary | ICD-10-CM

## 2010-12-01 ENCOUNTER — Encounter (INDEPENDENT_AMBULATORY_CARE_PROVIDER_SITE_OTHER): Payer: BC Managed Care – PPO | Admitting: Psychiatry

## 2010-12-01 DIAGNOSIS — F259 Schizoaffective disorder, unspecified: Secondary | ICD-10-CM

## 2010-12-06 ENCOUNTER — Encounter (INDEPENDENT_AMBULATORY_CARE_PROVIDER_SITE_OTHER): Payer: BC Managed Care – PPO | Admitting: Psychiatry

## 2010-12-06 DIAGNOSIS — F319 Bipolar disorder, unspecified: Secondary | ICD-10-CM

## 2010-12-19 ENCOUNTER — Encounter (INDEPENDENT_AMBULATORY_CARE_PROVIDER_SITE_OTHER): Payer: BC Managed Care – PPO | Admitting: Psychiatry

## 2010-12-19 DIAGNOSIS — F319 Bipolar disorder, unspecified: Secondary | ICD-10-CM

## 2010-12-20 ENCOUNTER — Encounter (INDEPENDENT_AMBULATORY_CARE_PROVIDER_SITE_OTHER): Payer: Medicare Other | Admitting: Psychiatry

## 2010-12-20 DIAGNOSIS — F259 Schizoaffective disorder, unspecified: Secondary | ICD-10-CM

## 2010-12-20 LAB — CBC
MCH: 31.8 pg (ref 26.0–34.0)
Platelets: 177 10*3/uL (ref 150–400)
RBC: 4.2 MIL/uL (ref 3.87–5.11)
WBC: 4.3 10*3/uL (ref 4.0–10.5)

## 2010-12-20 LAB — COMPREHENSIVE METABOLIC PANEL
AST: 21 U/L (ref 0–37)
Albumin: 4 g/dL (ref 3.5–5.2)
Chloride: 106 mEq/L (ref 96–112)
Creatinine, Ser: 0.98 mg/dL (ref 0.4–1.2)
GFR calc Af Amer: >60 mL/min (ref >60)
Total Bilirubin: 0.7 mg/dL (ref 0.3–1.2)

## 2010-12-20 LAB — DIFFERENTIAL
Basophils Absolute: 0 10*3/uL (ref 0.0–0.1)
Eosinophils Relative: 4 % (ref 0–5)
Lymphocytes Relative: 27 % (ref 12–46)
Lymphs Abs: 1.2 10*3/uL (ref 0.7–4.0)
Monocytes Absolute: 0.4 10*3/uL (ref 0.1–1.0)

## 2010-12-28 ENCOUNTER — Encounter (HOSPITAL_COMMUNITY): Payer: BC Managed Care – PPO | Admitting: Psychiatry

## 2011-01-01 LAB — BASIC METABOLIC PANEL
BUN: 6 mg/dL (ref 6–23)
CO2: 20 mEq/L (ref 19–32)
Chloride: 106 mEq/L (ref 96–112)
Creatinine, Ser: 0.99 mg/dL (ref 0.4–1.2)
Glucose, Bld: 74 mg/dL (ref 70–99)

## 2011-01-01 LAB — COMPREHENSIVE METABOLIC PANEL
ALT: 40 U/L — ABNORMAL HIGH (ref 0–35)
AST: 40 U/L — ABNORMAL HIGH (ref 0–37)
Alkaline Phosphatase: 97 U/L (ref 39–117)
CO2: 23 mEq/L (ref 19–32)
Chloride: 98 mEq/L (ref 96–112)
Creatinine, Ser: 1.07 mg/dL (ref 0.4–1.2)
GFR calc Af Amer: >60 mL/min (ref >60)
GFR calc non Af Amer: 54 mL/min — ABNORMAL LOW (ref >60)
Potassium: 3.6 mEq/L (ref 3.5–5.1)
Total Bilirubin: 0.4 mg/dL (ref 0.3–1.2)

## 2011-01-01 LAB — DIFFERENTIAL
Basophils Absolute: 0 10*3/uL (ref 0.0–0.1)
Basophils Absolute: 0 10*3/uL (ref 0.0–0.1)
Basophils Relative: 0 % (ref 0–1)
Basophils Relative: 1 % (ref 0–1)
Basophils Relative: 1 % (ref 0–1)
Eosinophils Absolute: 0 10*3/uL (ref 0.0–0.7)
Eosinophils Absolute: 0.1 10*3/uL (ref 0.0–0.7)
Eosinophils Relative: 1 % (ref 0–5)
Lymphocytes Relative: 13 % (ref 12–46)
Lymphocytes Relative: 31 % (ref 12–46)
Lymphs Abs: 0.6 10*3/uL — ABNORMAL LOW (ref 0.7–4.0)
Lymphs Abs: 1.3 10*3/uL (ref 0.7–4.0)
Monocytes Absolute: 0.2 10*3/uL (ref 0.1–1.0)
Monocytes Absolute: 0.4 10*3/uL (ref 0.1–1.0)
Monocytes Relative: 11 % (ref 3–12)
Monocytes Relative: 19 % — ABNORMAL HIGH (ref 3–12)
Monocytes Relative: 6 % (ref 3–12)
Neutro Abs: 1.1 10*3/uL — ABNORMAL LOW (ref 1.7–7.7)
Neutro Abs: 1.2 10*3/uL — ABNORMAL LOW (ref 1.7–7.7)
Neutro Abs: 1.6 10*3/uL — ABNORMAL LOW (ref 1.7–7.7)
Neutrophils Relative %: 40 % — ABNORMAL LOW (ref 43–77)
Neutrophils Relative %: 40 % — ABNORMAL LOW (ref 43–77)
Neutrophils Relative %: 57 % (ref 43–77)

## 2011-01-01 LAB — CBC
Hemoglobin: 12.7 g/dL (ref 12.0–15.0)
MCHC: 35 g/dL (ref 30.0–36.0)
MCHC: 35.2 g/dL (ref 30.0–36.0)
MCV: 92.5 fL (ref 78.0–100.0)
MCV: 92.6 fL (ref 78.0–100.0)
Platelets: 145 10*3/uL — ABNORMAL LOW (ref 150–400)
Platelets: 82 10*3/uL — ABNORMAL LOW (ref 150–400)
Platelets: 90 10*3/uL — ABNORMAL LOW (ref 150–400)
RBC: 3.89 MIL/uL (ref 3.87–5.11)
RBC: 4.23 MIL/uL (ref 3.87–5.11)
RDW: 13.8 % (ref 11.5–15.5)
WBC: 2.1 10*3/uL — ABNORMAL LOW (ref 4.0–10.5)
WBC: 2.8 10*3/uL — ABNORMAL LOW (ref 4.0–10.5)

## 2011-01-01 LAB — CK TOTAL AND CKMB (NOT AT ARMC)
CK, MB: 0.8 ng/mL (ref 0.3–4.0)
CK, MB: 1 ng/mL (ref 0.3–4.0)
Relative Index: INVALID (ref 0.0–2.5)
Relative Index: INVALID (ref 0.0–2.5)
Total CK: 82 U/L (ref 7–177)

## 2011-01-01 LAB — BASIC METABOLIC PANEL WITH GFR
BUN: 7 mg/dL (ref 6–23)
CO2: 20 meq/L (ref 19–32)
Calcium: 7.8 mg/dL — ABNORMAL LOW (ref 8.4–10.5)
Chloride: 106 meq/L (ref 96–112)
Creatinine, Ser: 1.02 mg/dL (ref 0.4–1.2)
GFR calc non Af Amer: 57 mL/min — ABNORMAL LOW
Glucose, Bld: 83 mg/dL (ref 70–99)
Potassium: 3.9 meq/L (ref 3.5–5.1)
Sodium: 134 meq/L — ABNORMAL LOW (ref 135–145)

## 2011-01-01 LAB — CULTURE, BLOOD (ROUTINE X 2): Culture: NO GROWTH

## 2011-01-01 LAB — TSH: TSH: 0.19 u[IU]/mL — ABNORMAL LOW (ref 0.350–4.500)

## 2011-01-01 LAB — BLOOD GAS, ARTERIAL
Acid-base deficit: 4.4 mmol/L — ABNORMAL HIGH (ref 0.0–2.0)
Bicarbonate: 20.2 mEq/L (ref 20.0–24.0)
O2 Saturation: 96.2 %
Patient temperature: 37
TCO2: 18.8 mmol/L (ref 0–100)
pH, Arterial: 7.352 (ref 7.350–7.400)

## 2011-01-01 LAB — URINALYSIS, ROUTINE W REFLEX MICROSCOPIC
Bilirubin Urine: NEGATIVE
Glucose, UA: NEGATIVE mg/dL
Ketones, ur: NEGATIVE mg/dL
pH: 6 (ref 5.0–8.0)

## 2011-01-01 LAB — T4, FREE: Free T4: 0.84 ng/dL (ref 0.80–1.80)

## 2011-01-01 LAB — POCT I-STAT, CHEM 8
HCT: 36 % (ref 36.0–46.0)
Hemoglobin: 12.2 g/dL (ref 12.0–15.0)
Potassium: 3.9 mEq/L (ref 3.5–5.1)
Sodium: 137 mEq/L (ref 135–145)

## 2011-01-01 LAB — URINE MICROSCOPIC-ADD ON

## 2011-01-03 ENCOUNTER — Encounter (HOSPITAL_COMMUNITY): Payer: BC Managed Care – PPO | Admitting: Psychiatry

## 2011-01-06 ENCOUNTER — Other Ambulatory Visit (HOSPITAL_COMMUNITY)
Admission: RE | Admit: 2011-01-06 | Discharge: 2011-01-06 | Disposition: A | Discharge status: Home / Self Care | Payer: Medicare Other | Source: Ambulatory Visit | Attending: Obstetrics and Gynecology | Admitting: Obstetrics and Gynecology

## 2011-01-06 ENCOUNTER — Other Ambulatory Visit: Payer: Self-pay | Admitting: Obstetrics & Gynecology

## 2011-01-06 ENCOUNTER — Other Ambulatory Visit: Payer: Self-pay | Admitting: Adult Health

## 2011-01-06 ENCOUNTER — Encounter (INDEPENDENT_AMBULATORY_CARE_PROVIDER_SITE_OTHER): Payer: Medicare Other | Admitting: Psychiatry

## 2011-01-06 DIAGNOSIS — F319 Bipolar disorder, unspecified: Secondary | ICD-10-CM

## 2011-01-06 DIAGNOSIS — N6320 Unspecified lump in the left breast, unspecified quadrant: Secondary | ICD-10-CM

## 2011-01-06 DIAGNOSIS — Z124 Encounter for screening for malignant neoplasm of cervix: Secondary | ICD-10-CM | POA: Insufficient documentation

## 2011-01-10 ENCOUNTER — Encounter (INDEPENDENT_AMBULATORY_CARE_PROVIDER_SITE_OTHER): Payer: Medicare Other | Admitting: Psychiatry

## 2011-01-10 DIAGNOSIS — F3189 Other bipolar disorder: Secondary | ICD-10-CM

## 2011-01-11 ENCOUNTER — Ambulatory Visit (HOSPITAL_COMMUNITY)
Admission: RE | Admit: 2011-01-11 | Discharge: 2011-01-11 | Disposition: A | Discharge status: Home / Self Care | Payer: Medicare Other | Source: Ambulatory Visit | Attending: Obstetrics & Gynecology | Admitting: Obstetrics & Gynecology

## 2011-01-11 ENCOUNTER — Ambulatory Visit (HOSPITAL_COMMUNITY): Payer: Medicare Other

## 2011-01-11 DIAGNOSIS — N63 Unspecified lump in unspecified breast: Secondary | ICD-10-CM | POA: Insufficient documentation

## 2011-01-11 DIAGNOSIS — Z853 Personal history of malignant neoplasm of breast: Secondary | ICD-10-CM | POA: Insufficient documentation

## 2011-01-11 DIAGNOSIS — Z9010 Acquired absence of unspecified breast and nipple: Secondary | ICD-10-CM | POA: Insufficient documentation

## 2011-01-11 DIAGNOSIS — N6320 Unspecified lump in the left breast, unspecified quadrant: Secondary | ICD-10-CM

## 2011-01-17 ENCOUNTER — Encounter (INDEPENDENT_AMBULATORY_CARE_PROVIDER_SITE_OTHER): Payer: Medicare Other | Admitting: Psychiatry

## 2011-01-17 DIAGNOSIS — F319 Bipolar disorder, unspecified: Secondary | ICD-10-CM

## 2011-01-25 ENCOUNTER — Ambulatory Visit: Payer: BC Managed Care – PPO | Admitting: Orthopedic Surgery

## 2011-01-26 ENCOUNTER — Encounter (INDEPENDENT_AMBULATORY_CARE_PROVIDER_SITE_OTHER): Payer: Medicare Other | Admitting: Psychiatry

## 2011-01-26 DIAGNOSIS — F3189 Other bipolar disorder: Secondary | ICD-10-CM

## 2011-01-30 ENCOUNTER — Encounter (HOSPITAL_COMMUNITY): Payer: Medicare Other | Admitting: Psychiatry

## 2011-02-21 NOTE — H&P (Signed)
NAME:  Jessica Reilly, MUNOS NO.:  [id number]   MEDICAL RECORD NO.:  [id number]          PATIENT TYPE:  EMS   LOCATION:  ED                            FACILITY:  [facility]   PHYSICIAN:  Osvaldo Shipper, MD     DATE OF BIRTH:  Mar 01, 1961   DATE OF ADMISSION:  05/08/2008  DATE OF DISCHARGE:  LH                              HISTORY & PHYSICAL   PMD:  Corrie Mckusick, M.D.   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   ADMISSION DIAGNOSES:  1. Severe constipation with fecal impaction.  2. Severe rectal pain.  3. History of breast cancer, status post bilateral mastectomy.  4. Hypertension.   CHIEF COMPLAINT:  Constipation and rectal pain.   HISTORY OF PRESENT ILLNESS:  Patient is a 50 year old Caucasian female  who has a history of breast cancer diagnosed last year, who has  undergone bilateral mastectomy at [facility] and underwent  chemotherapy, the last cycle of which was in December, 2008.   She apparently was in her usual state of health until a few weeks ago  when she started noticing decrease in bowel movements.  She said she  does not really recall when she had a proper BM.  It could have been a  couple of weeks ago.  Then about one week ago, she had a very small BM  mixed with blood.  Since then, she has not moved her bowels at all.  She  has extreme and severe pain in her rectal area, which she describes as  cramping pain.  Also located in the lower abdomen.  The pain is  described as 10/10 in intensity.  No radiation of the pain.  Also  complains of nausea with no emesis.  She is on Vicodin at home, although  she mentions that she has not been taking them on a consistent basis.   MEDICATIONS AT HOME:  1. Nexium 40 mg daily.  2. Toprol XL 100 mg daily.  3. Vicodin as needed.  4. Xanax, unknown dose, as needed.   ALLERGIES:  CEFTIN, which caused a rash.   PAST MEDICAL HISTORY:  Positive  for breast cancer, status post bilateral  mastectomy, done at [facility] in September, 2008.  She also had bilateral  oophorectomy because of hormone-receptor analysis with the breast  cancer.  She also has a history of GERD, dyslipidemia, hypertension,  nephrolithiasis.  For her breast cancer, she was on chemo, the last  cycle of which was in 2008.  She does follow up with [facility] for routine  care.   SOCIAL HISTORY:  Lives in [place] with her boyfriend.  No smoking,  alcohol, or illicit drug use.  She is currently unemployed.  No  significant family medical history.   REVIEW OF SYSTEMS:  Patient unable to cooperate with [name] because of  severe pain.   PHYSICAL EXAMINATION:  VITAL SIGNS:  Temperature 97.2, blood pressure  136/83, heart rate in the 80s, respiratory rate 14, saturation 99-100%  on room air.  GENERAL:  An obese white female in no distress but in a considerable  amount of discomfort.  HEENT:  There is no pallor, no icterus.  Oral mucous membranes are  moist.  No old lesions are noted.  NECK:  Soft and supple.  No thyromegaly is appreciated.  LUNGS:  Clear to auscultation bilaterally.  No wheezing, rales, or  rhonchi.  CARDIOVASCULAR:  S1 and S2 is normal.  Regular.  No murmurs appreciated.  ABDOMEN:  Soft, nontender, nondistended.  Bowel sounds are present.  No  mass or organomegaly is appreciated.  RECTAL:  The rectal area appears to be slightly sore.  She appears to be  slightly inflamed, even with a slight touch with my finger, she  experienced pain.  Digital rectal exam was limited; however, stool was  felt immediately, and upon manipulation of that, a small amount of  watery stool did squirt out.  NEUROLOGIC:  She is alert and oriented x3.  No cranial, motor, or  sensory deficits are present.   LABS:  CBC unremarkable.  Her CMET showed a glucose of 148 and an AST of  47, otherwise unremarkable.   She did have an abdominal series done which showed moderate fecal  burden  within the colon and rectum with clinical history of constipation.   ASSESSMENT:  This is a 50 year old Caucasian female who has a history of  breast cancer, who presents with a couple-week history of constipation  and now with severe rectal pain and fecal impaction.   PLAN:  1. Fecal impaction with constipation.  The etiology is possibly opioid      use.  TSH will be checked.  I have discussed the case with Dr.      Lovell Sheehan, who actually is in the ED currently trying to manually      disimpacted under conscious sedation because of her significant      pain.  Patient will have to be observed overnight in the hospital.      He will also recommend a soapsuds enema to further remove all of      the feces from her colon.  The patient will be started on MiraLax.      Patient will need colonoscopy at some point.  2. History of breast cancer, stable.  No further evaluation at this      time.  3. History of hypertension:  Continue with Toprol XL.  4. Will continue with Nexium.  Pain control with Dilaudid, as needed.  5. DVT and GI prophylaxis will be initiated.   Further management decisions will depend on results of further testing  and patient's response to treatment.      Osvaldo Shipper, MD  Electronically Signed     GK/MEDQ  D:  05/08/2008  T:  05/08/2008  Job:  [id number]   cc:   Corrie Mckusick, M.D.  Fax: [phone]

## 2011-02-21 NOTE — Op Note (Signed)
NAME:  Jessica Reilly, Jessica Reilly NO.:  [id number]   MEDICAL RECORD NO.:  [id number]          PATIENT TYPE:  AMB   LOCATION:  DSC                          FACILITY:  [facility]   PHYSICIAN:  Currie Paris, M.D.DATE OF BIRTH:  [birth year]   DATE OF PROCEDURE:  07/15/2007  DATE OF DISCHARGE:  07/11/2007                               OPERATIVE REPORT   PREOPERATIVE DIAGNOSIS:  Carcinoma right breast.   POSTOPERATIVE DIAGNOSIS:  Carcinoma right breast.   OPERATION:  Port-A-Cath placement for chemotherapy.   SURGEON:  Currie Paris, M.D.   ANESTHESIA:  General.   CLINICAL HISTORY:  This is a 46-year lady who is presented with a fairly  advanced right breast cancer who needs preoperative chemotherapy.  Port-  A-Cath placement was suggested and the patient wished to proceed.   DESCRIPTION OF PROCEDURE:  The patient was seen in the holding area and  she had no further questions.  I reviewed Port-A-Cath placement, risks  and complications.  We discussed that we would leave it accessed since  her chemotherapy is in two days.   The patient taken to the operating room and after satisfactory general  anesthesia had been obtained, the upper chest and lower neck were  prepped and draped with sterile field.  She is placed in Trendelenburg  position.  The time-out was done.   I entered the left subclavian vein on initial attempt and the guidewire  was threaded and using fluoro advanced into the right atrial area.   A transverse incision made fairly medially because the patient is quite  obese I want to make sure we had an area that we could get the port  anchored well and not have too much subcutaneous tissue between the skin  and the port.  I fashioned a pocket there and took out some of  subcutaneous tissue to make a pocket a little bit more accessible.   The Port-A-Cath tubing was then pulled from the reservoir site into the  guidewire site.  The guidewire tract was  dilated once and the dilator  guidewire removed and the catheter advanced to 25 cm.  It aspirated  irrigated easily.  Using fluoro I backed it up to 18 cm where it  appeared to be at the junction or just above the junction of the  superior vena cava and right atrium.  It aspirated and irrigated easily.   The reservoir was attached and locking mechanism engaged.  It aspirated  and flushed easily.  It was secured to the deep tissues with 2-0  Prolenes.  Final check was made with fluoro.  It was seen to have good  positioning with the tip in the distal vena cava.  The catheter was  aspirated, flushed with dilute heparin.   Incision was then closed with 3-0 Vicryl, 4-0 Monocryl subcuticular and  Steri-Strips.  The reservoir was then accessed and I got good aspiration  and it was flushed with first dilute and then concentrated heparin and  left accessed.   The patient tolerated procedure well.  There no operative complications.  All counts were correct.  Currie Paris, M.D.  Electronically Signed     CJS/MEDQ  D:  07/15/2007  T:  07/15/2007  Job:  [id number]

## 2011-02-21 NOTE — Consult Note (Signed)
NAME:  Jessica Reilly, Jessica Reilly NO.:  [id number]   MEDICAL RECORD NO.:  [id number]          PATIENT TYPE:  OBV   LOCATION:  A302                          FACILITY:  [facility]   PHYSICIAN:  Dalia Heading, M.D.  DATE OF BIRTH:  1961/03/27   DATE OF CONSULTATION:  05/08/2008  DATE OF DISCHARGE:                                 CONSULTATION   REASON FOR CONSULTATION:  Obstipation.   HISTORY OF PRESENT ILLNESS:  The patient is a 50 year old white female  who presents with a fecal impaction.  She has been unable to move her  bowels for many weeks.  She presented to the emergency room with  significant rectal pain.  She was refusing any attempts at disimpaction  without sedation.  The Surgery consultation was obtained to perform  disimpaction under conscious sedation.  Her medical history is noted on  the chart.   PROCEDURE NOTE:  The patient was placed in left lateral decubitus  position.  Conscious sedation protocol was performed.  Informed consent  and a time-out were performed.  The patient was given Versed IV and 50  mg of Demerol IV.  A significant amount of stool was noted in the rectal  vault.  This was disimpacted without difficulty.  The patient tolerated  the procedure well.  She does have some hemorrhoidal disease.   IMPRESSION:  Obstipation.   PLAN:  The patient was admitted by the hospital service for soapsuds  enemas.  She will need a colonoscopy as an outpatient.  I suspected that  she has had problems with the bowels in the past due to chemotherapy for  breast cancer and Vicodin for pain.      Dalia Heading, M.D.  Electronically Signed     MAJ/MEDQ  D:  05/08/2008  T:  05/09/2008  Job:  [id number]   cc:   Corrie Mckusick, M.D.  Fax: [phone]

## 2011-02-21 NOTE — Consult Note (Signed)
NAMEMarland Kitchen  CHAYLA, SHANDS NO.:  [id number]   MEDICAL RECORD NO.:  [id number]          PATIENT TYPE:  EMS   LOCATION:  MAJO                         FACILITY:  [facility]   PHYSICIAN:  Ollen Gross. Vernell Morgans, M.D. DATE OF BIRTH:  [birth year]   DATE OF CONSULTATION:  07/29/2007  DATE OF DISCHARGE:  07/30/2007                                 CONSULTATION   Ms. Burbage is a 50 year old white female who has a history of breast  cancer, who had a Port-A-Cath placed in her left chest by Dr. Jamey Ripa on  October 6.  She has noticed some soreness at the site.  She went to the  cancer center today and had the nurses push on the [facility] site to try to  see if they could access it, and at the time she felt some tenderness  and then later in the day felt some swelling of her left neck.  She did  not have it injected today.  She presents now for evaluation.   PAST MEDICAL HISTORY:  Significant for:  1. Breast cancer.  2. Gastroesophageal reflux disease.  3. Hypercholesterolemia.  4. Hypertension.  5. Kidney stones.   PAST SURGICAL HISTORY:  Placement of the Port.   MEDICATIONS:  Include:  1. Limbitrol.  2. Nexium.  3. Toprol.  4. Zocor.  5. Ibuprofen.  6. Lidoderm.  7. Vicodin.  8. Xanax.  9. Ativan.  10.Neulasta.   ALLERGIES:  ARE TO CEFTIN.   SOCIAL HISTORY:  She denies use of tobacco products.   FAMILY HISTORY:  Noncontributory.   PHYSICAL EXAMINATION:  VITAL SIGNS:  Her temp is 98.4, blood pressure  141/92, pulse 93, O2 sat 98.  GENERAL:  She is an obese white female in no acute distress.  SKIN:  Warm and dry.  No jaundice.  EYES:  Extraocular muscles intact.  Pupils equal, round and reactive to  light.  Sclerae nonicteric.  LUNGS:  Clear bilaterally with no use of accessory respiratory muscles.  CARDIOVASCULAR:  Heart has a regular rate and rhythm with an impulse in  left chest.  ABDOMEN:  Soft and nontender.  No palpable mass or hepatosplenomegaly.  EXTREMITIES:  No  cyanosis, clubbing or edema with good strength in her  arms and legs.  PSYCHOLOGICALLY:  She is alert and oriented x3 with no evidence today of  anxiety or depression.   ASSESSMENT/PLAN:  This is a 50 year old white female with some  tenderness around her Port site and some new swelling of her left neck.  She is able to finish her sentences without appearing short of breath or  using any accessory respiratory muscles.  She does not have any signs of  bruising or hematoma on her left neck and she has minimal swelling  around the [place] site.  Will plan to CT her neck and chest to further  evaluate this swelling that she is noticing and then proceed  accordingly.      Ollen Gross. Vernell Morgans, M.D.  Electronically Signed     PST/MEDQ  D:  07/30/2007  T:  07/30/2007  Job:  [id number]

## 2011-02-21 NOTE — Group Therapy Note (Signed)
NAME:  KIRK, SAMPLEY NO.:  [id number]   MEDICAL RECORD NO.:  [id number]          PATIENT TYPE:  OBV   LOCATION:  A302                          FACILITY:  [facility]   PHYSICIAN:  Margaretmary Dys, M.D.DATE OF BIRTH:  1961-01-29   DATE OF PROCEDURE:  05/09/2008  DATE OF DISCHARGE:                                 PROGRESS NOTE   SUBJECTIVE:  The patient feels slightly better today.  Actually she  feels less bloated and less distended.  The patient was disimpacted by  Dr. Lovell Sheehan yesterday.  Will await his input for subsequent care.  The  patient is requested for a had follow-up abdominal series which I will  defer to Dr. Lovell Sheehan to make a call on this point.   OBJECTIVE:  GENERAL:  Conscious, alert, comfortable, not in acute  distress.  Well-oriented in time, place, and person.  VITAL SIGNS:  Blood pressure is 135/66, pulse was 60, respirations 20,  temperature 98.2 degrees Fahrenheit, oxygen saturation is 96% on room  air.  HEENT:  Normocephalic, atraumatic.  Oral mucosa was moist.  NECK:  Supple.  No JVD, no lymphadenopathy.  LUNGS:  Decreased air entry bilaterally.  No crackles, no wheezing.  HEART:  S1/S2 regular.  No S3, gallops, or rubs.  ABDOMEN:  Soft, obese.  Not tender, not distended.  Obese, not tender,  not distended.  Multiple scar tissue was noted.  Bowel sounds positive.  EXTREMITIES:  No edema, no calf induration or tenderness  was noted.  CENTRAL NERVOUS SYSTEM:  Grossly intact.   LABORATORY DATA:  TSH level is 0.951.   ASSESSMENT/PLAN:  1. Severe obstipation, status postmanual disimpaction under conscious      sedation by Dr. Lovell Sheehan.  2. Severe rectal pain.  3. History of breast cancer, status post bilateral mastectomy.  4. History of hypertension.   PLAN:  1. Will continue current soap suds enemas as recommended by surgery.      TSH level is unremarkable.  Will continue all her other home      medications including her Toprol.  2. Will  continue with Nexium.  3. Will try to avoid the use of narcotic analgesia as much as      possible.  4. Will defer to Dr. Lovell Sheehan regarding follow-up abdominal x-rays to      assess degree of relief from the fecal load.   DISPOSITION:  Likely in the hospital for the next 24-48 hours.      Margaretmary Dys, M.D.  Electronically Signed     AM/MEDQ  D:  05/09/2008  T:  05/10/2008  Job:  [id number]

## 2011-02-21 NOTE — Procedures (Signed)
NAME:  Jessica Reilly, Jessica Reilly NO.:  [id number]   MEDICAL RECORD NO.:  [id number]          PATIENT TYPE:  REC   LOCATION:  RAD                           FACILITY:  [facility]   PHYSICIAN:  Gerrit Friends. Dietrich Pates, MD, FACCDATE OF BIRTH:  08/22/1961   DATE OF PROCEDURE:  09/25/2007  DATE OF DISCHARGE:                                ECHOCARDIOGRAM   REFERRING:  Dr. Mariel Sleet   CLINICAL DATA:  A 50 year old woman receiving chemotherapy for carcinoma  of the breast.  M-mode aorta 3.0, left atrium 3.7, septum 1.3, posterior  wall 1.1, LV diastole 4.1, LV systole 3.5.   1. Technically suboptimal and somewhat limited echocardiographic      study.  2. Normal left atrium, right atrium and right ventricle.  3. Normal proximal ascending aorta.  4. Normal aortic, mitral, tricuspid and pulmonic valves.  Normal      proximal ascending aorta.  5. Normal Doppler study with physiologic tricuspid regurgitation.  6. Normal left ventricular size; slight hypertrophy of the septum with      normal wall thickness in other myocardial regions; mild distal      septal hypokinesis in one echocardiographic view with preserved      overall LV systolic function.  Estimated ejection fraction is 0.55-      0.60.  7. Comparison with prior study of July 11, 2007:  Small segmental      wall motion abnormality now noted.      Gerrit Friends. Dietrich Pates, MD, [facility]  Electronically Signed     RMR/MEDQ  D:  09/26/2007  T:  09/26/2007  Job:  [id number]

## 2011-02-21 NOTE — Procedures (Signed)
NAME:  ADDY, MCMANNIS NO.:  [id number]   MEDICAL RECORD NO.:  [id number]          PATIENT TYPE:  REC   LOCATION:  RAD                           FACILITY:  [facility]   PHYSICIAN:  Pricilla Riffle, MD, FACCDATE OF BIRTH:  10-21-1960   DATE OF PROCEDURE:  07/11/2007  DATE OF DISCHARGE:                                ECHOCARDIOGRAM   TEST INDICATIONS:  50 year old being evaluated for chemotherapy, test to  evaluate LV function.   2-D echo with echo Doppler.  The left ventricle normal in size with an  end-diastolic dimension of 46 mm.  The interventricular septum is mildly  thickened at 14 mm.  Posterior wall normal at 11 mm.   The left atrium is grossly normal.  The right atrium and right ventricle  are grossly normal in size.   The aortic valve is mildly thickened, not stenotic.  There is no  insufficiency.  Mitral valve is normal with no insufficiency.  Tricuspid  valve is normal with no insufficiency.  Pulmonic valve is normal with no  insufficiency.   Overall LV systolic function is normal with an LVEF of approximately  60%.  RVEF is normal.   No pericardial effusion is seen.      Pricilla Riffle, MD, [facility]  Electronically Signed     PVR/MEDQ  D:  07/11/2007  T:  07/12/2007  Job:  [id number]   cc:   Ladona Horns. Mariel Sleet, MD  Fax: [phone]

## 2011-02-24 NOTE — Op Note (Signed)
NAME:  Jessica Reilly, Jessica Reilly NO.:  [id number]   MEDICAL RECORD NO.:  [id number]          PATIENT TYPE:  AMB   LOCATION:  DSC                          FACILITY:  [facility]   PHYSICIAN:  Tennis Must Meyerdierks, M.D.DATE OF BIRTH:  09-21-1961   DATE OF PROCEDURE:  09/19/2006  DATE OF DISCHARGE:                               OPERATIVE REPORT   PREOPERATIVE DIAGNOSIS:  Mass left thumb.   POSTOPERATIVE DIAGNOSIS:  Mass left thumb.   PROCEDURE:  Excision of mass left thumb.   SURGEON:  Lowell Bouton, M.D.   ANESTHESIA:  0.5% Marcaine local with sedation.   OPERATIVE FINDINGS:  The patient had a round vascular appearing mass  that could be consistent with a hemangioma or a pyogenic granuloma.  It  was covered over and was not bleeding.   PROCEDURE:  Under 0.5% Marcaine local anesthesia with a tourniquet on  the left arm, left hand was prepped and draped in usual fashion.  After  explaining the limb the tourniquet was inflated to 250 mmHg.  An  elliptical incision was made around the mass longitudinally and it was  completely excised.  There did not appear to be significant AV  malformation beneath it.  The wound was irrigated with saline.  The skin  was closed with 4-0 nylon sutures.  Sterile dressings were applied.  The  patient had the tourniquet released with good circulation of the hand.  She went to recovery room awake in stable condition.      Lowell Bouton, M.D.  Electronically Signed     EMM/MEDQ  D:  09/19/2006  T:  09/19/2006  Job:  [id number]   cc:   Corrie Mckusick, M.D.

## 2011-03-29 ENCOUNTER — Encounter (INDEPENDENT_AMBULATORY_CARE_PROVIDER_SITE_OTHER): Payer: No Typology Code available for payment source | Admitting: Psychiatry

## 2011-03-29 DIAGNOSIS — F319 Bipolar disorder, unspecified: Secondary | ICD-10-CM

## 2011-03-30 ENCOUNTER — Encounter (INDEPENDENT_AMBULATORY_CARE_PROVIDER_SITE_OTHER): Payer: No Typology Code available for payment source | Admitting: Psychiatry

## 2011-03-30 DIAGNOSIS — F3189 Other bipolar disorder: Secondary | ICD-10-CM

## 2011-04-06 ENCOUNTER — Encounter: Payer: Self-pay | Admitting: Orthopedic Surgery

## 2011-04-06 ENCOUNTER — Ambulatory Visit: Payer: Self-pay | Admitting: Orthopedic Surgery

## 2011-04-06 ENCOUNTER — Encounter (HOSPITAL_COMMUNITY): Payer: No Typology Code available for payment source | Admitting: Psychiatry

## 2011-04-07 ENCOUNTER — Encounter (INDEPENDENT_AMBULATORY_CARE_PROVIDER_SITE_OTHER): Payer: No Typology Code available for payment source | Admitting: Psychiatry

## 2011-04-07 DIAGNOSIS — F319 Bipolar disorder, unspecified: Secondary | ICD-10-CM

## 2011-04-13 ENCOUNTER — Encounter (HOSPITAL_COMMUNITY): Payer: No Typology Code available for payment source | Admitting: Psychiatry

## 2011-04-28 ENCOUNTER — Encounter (INDEPENDENT_AMBULATORY_CARE_PROVIDER_SITE_OTHER): Payer: No Typology Code available for payment source | Admitting: Psychiatry

## 2011-04-28 DIAGNOSIS — F319 Bipolar disorder, unspecified: Secondary | ICD-10-CM

## 2011-05-02 ENCOUNTER — Encounter (HOSPITAL_COMMUNITY): Payer: No Typology Code available for payment source | Admitting: Psychiatry

## 2011-05-04 ENCOUNTER — Encounter: Payer: Self-pay | Admitting: Orthopedic Surgery

## 2011-05-04 ENCOUNTER — Encounter (INDEPENDENT_AMBULATORY_CARE_PROVIDER_SITE_OTHER): Payer: No Typology Code available for payment source | Admitting: Psychiatry

## 2011-05-04 ENCOUNTER — Ambulatory Visit (INDEPENDENT_AMBULATORY_CARE_PROVIDER_SITE_OTHER): Payer: Medicare Other | Admitting: Orthopedic Surgery

## 2011-05-04 DIAGNOSIS — IMO0002 Reserved for concepts with insufficient information to code with codable children: Secondary | ICD-10-CM

## 2011-05-04 DIAGNOSIS — M1710 Unilateral primary osteoarthritis, unspecified knee: Secondary | ICD-10-CM

## 2011-05-04 DIAGNOSIS — F259 Schizoaffective disorder, unspecified: Secondary | ICD-10-CM

## 2011-05-04 MED ORDER — TRAMADOL-ACETAMINOPHEN 37.5-325 MG PO TABS: 1.0000 | ORAL_TABLET | ORAL | Status: DC | PRN

## 2011-05-04 MED ORDER — METHYLPREDNISOLONE ACETATE 40 MG/ML IJ SUSP: 40.0000 mg | Freq: Once | INTRAMUSCULAR | Status: DC

## 2011-05-04 MED ORDER — TRAMADOL-ACETAMINOPHEN 37.5-325 MG PO TABS: 1.0000 | ORAL_TABLET | ORAL | Status: AC | PRN

## 2011-05-04 NOTE — Progress Notes (Signed)
Chief complaint bilateral knee pain I want shots  50 year old female previously treated with cortisone injections, anti-inflammatories, Synvisc injections presents with bilateral knee pain severe.  Injected in March of this year by Dr. Eulah Pont and Thurston Hole  Review of systems recent lymph node biopsy to followup for breast cancer.  Other medical problems stable at this time  Bilateral anterior and diffuse knee pain  History of DVT.  Previous double mastectomy, previous bilateral for RIGHT to me  Overall general appearance is normal with mild obesity  She is oriented x3 her mood and affect are normal  The gait and station are unremarkable there is no major limp.  RIGHT knee examination: Inspection crepitus and medial joint line tenderness to palpation, Range of motion is 120. Stability tests are normal Strength assessment is normal Provocative tests are negative Skin is normal  Pulse and temperature are normal  Lymph nodes are normal  Sensation is normal  Pathologic reflexes are negative  Balance is normal  LEFT knee examination Inspection reveals crepitus and medial joint line tenderness Range of Motion is normal Strength assessment is normal Provocative tests are negative Skin is normal Pulse and temperature normal Lymph nodes normal Sensation normal Pathologic reflexes are negative  Impression Osteoarthritis of the knee, bilateral  Plan bilateral injections  Verbal consent was obtained   Time out completed   Under sterile conditions the right knee was injected with Depomedrol 40 mg / ml (1 ml) and lidocaine 1% (4 ml)  There were no complications  Injection LEFT knee.  Consent was obtained.  Time out was taken   LEFT knee was injected with Depo-Medrol 40 mg plus lidocaine 1% 4 cc.  Knee was prepped with alcohol and anesthetized with ethyl chloride.  The injection was tolerated without complication.

## 2011-05-04 NOTE — Patient Instructions (Addendum)
You have received a steroid shot. 15% of patients experience increased pain at the injection site with in the next 24 hours. This is best treated with ice and tylenol extra strength 2 tabs every 8 hours. If you are still having pain please call the office.   Call us when you need a repeat injection

## 2011-05-09 ENCOUNTER — Encounter (HOSPITAL_COMMUNITY): Payer: No Typology Code available for payment source | Admitting: Psychiatry

## 2011-05-12 ENCOUNTER — Encounter (HOSPITAL_COMMUNITY): Payer: No Typology Code available for payment source | Admitting: Psychiatry

## 2011-05-18 ENCOUNTER — Encounter (HOSPITAL_COMMUNITY): Payer: No Typology Code available for payment source | Admitting: Psychiatry

## 2011-05-31 ENCOUNTER — Encounter (INDEPENDENT_AMBULATORY_CARE_PROVIDER_SITE_OTHER): Payer: Medicare Other | Admitting: Psychiatry

## 2011-05-31 DIAGNOSIS — F319 Bipolar disorder, unspecified: Secondary | ICD-10-CM

## 2011-06-01 ENCOUNTER — Ambulatory Visit (INDEPENDENT_AMBULATORY_CARE_PROVIDER_SITE_OTHER): Payer: Medicare Other | Admitting: Otolaryngology

## 2011-06-01 DIAGNOSIS — J343 Hypertrophy of nasal turbinates: Secondary | ICD-10-CM

## 2011-06-01 DIAGNOSIS — J320 Chronic maxillary sinusitis: Secondary | ICD-10-CM

## 2011-06-01 DIAGNOSIS — H6120 Impacted cerumen, unspecified ear: Secondary | ICD-10-CM

## 2011-06-01 DIAGNOSIS — J310 Chronic rhinitis: Secondary | ICD-10-CM

## 2011-06-02 ENCOUNTER — Other Ambulatory Visit (INDEPENDENT_AMBULATORY_CARE_PROVIDER_SITE_OTHER): Payer: Self-pay | Admitting: Otolaryngology

## 2011-06-02 DIAGNOSIS — J320 Chronic maxillary sinusitis: Secondary | ICD-10-CM

## 2011-06-07 ENCOUNTER — Ambulatory Visit (HOSPITAL_COMMUNITY)
Admission: RE | Admit: 2011-06-07 | Discharge: 2011-06-07 | Disposition: A | Discharge status: Home / Self Care | Payer: Medicare Other | Source: Ambulatory Visit | Attending: Otolaryngology | Admitting: Otolaryngology

## 2011-06-07 DIAGNOSIS — J329 Chronic sinusitis, unspecified: Secondary | ICD-10-CM | POA: Insufficient documentation

## 2011-06-07 DIAGNOSIS — I10 Essential (primary) hypertension: Secondary | ICD-10-CM | POA: Insufficient documentation

## 2011-06-07 DIAGNOSIS — J320 Chronic maxillary sinusitis: Secondary | ICD-10-CM

## 2011-06-14 ENCOUNTER — Encounter (HOSPITAL_COMMUNITY): Payer: Medicare Other | Admitting: Psychiatry

## 2011-06-22 ENCOUNTER — Encounter (HOSPITAL_COMMUNITY): Payer: Medicare Other | Admitting: Psychiatry

## 2011-06-29 ENCOUNTER — Encounter (INDEPENDENT_AMBULATORY_CARE_PROVIDER_SITE_OTHER): Payer: Medicare Other | Admitting: Psychiatry

## 2011-06-29 ENCOUNTER — Ambulatory Visit (INDEPENDENT_AMBULATORY_CARE_PROVIDER_SITE_OTHER): Payer: Medicare Other | Admitting: Otolaryngology

## 2011-06-29 ENCOUNTER — Encounter (HOSPITAL_COMMUNITY): Payer: Medicare Other | Admitting: Psychiatry

## 2011-06-29 DIAGNOSIS — J310 Chronic rhinitis: Secondary | ICD-10-CM

## 2011-06-29 DIAGNOSIS — F259 Schizoaffective disorder, unspecified: Secondary | ICD-10-CM

## 2011-06-29 DIAGNOSIS — J343 Hypertrophy of nasal turbinates: Secondary | ICD-10-CM

## 2011-07-04 ENCOUNTER — Encounter (INDEPENDENT_AMBULATORY_CARE_PROVIDER_SITE_OTHER): Payer: Medicare Other | Admitting: Psychiatry

## 2011-07-04 DIAGNOSIS — F319 Bipolar disorder, unspecified: Secondary | ICD-10-CM

## 2011-07-07 LAB — COMPREHENSIVE METABOLIC PANEL
ALT: 35
AST: 47 — ABNORMAL HIGH
Alkaline Phosphatase: 66
CO2: 24
Calcium: 9.8
Chloride: 106
GFR calc non Af Amer: >60
Glucose, Bld: 148 — ABNORMAL HIGH
Sodium: 139
Total Bilirubin: 0.6

## 2011-07-07 LAB — DIFFERENTIAL
Basophils Relative: 0
Lymphocytes Relative: 15
Lymphs Abs: 0.6 — ABNORMAL LOW
Lymphs Abs: 0.7
Monocytes Absolute: 0.3
Monocytes Relative: 3
Monocytes Relative: 7
Neutro Abs: 7.7
Neutro Abs: 3.3
Neutrophils Relative %: 90 — ABNORMAL HIGH

## 2011-07-07 LAB — CBC
Hemoglobin: 13.7
Hemoglobin: 11.9 — ABNORMAL LOW
MCHC: 33.8
MCHC: 34.4
RBC: 4.23
RBC: 3.65 — ABNORMAL LOW
WBC: 8.5

## 2011-07-07 LAB — BASIC METABOLIC PANEL
CO2: 26
Calcium: 8.8
GFR calc Af Amer: >60
GFR calc non Af Amer: >60
Sodium: 143

## 2011-07-13 ENCOUNTER — Telehealth: Payer: Self-pay | Admitting: Orthopedic Surgery

## 2011-07-13 NOTE — Telephone Encounter (Signed)
Patient called to ask if Dr. Romeo Apple would order Prednisone for her bilateral knee pain, if Dr. Romeo Apple thinks it would help. She said it had helped her in the past.  She has an appointment scheduled 08/09/11.  I relayed that Rx requests need to be received by noon on Thursdays, otherwise Monday is the next day that requests are done.   She has given her pharmacy as [place] if medication is prescribed. Her cell ph# is [phone].

## 2011-07-14 LAB — DIFFERENTIAL
Basophils Absolute: 0
Eosinophils Absolute: 0 — ABNORMAL LOW
Eosinophils Relative: 0
Monocytes Absolute: 0.6

## 2011-07-14 LAB — CBC
HCT: 33.1 — ABNORMAL LOW
MCV: 95.8
Platelets: 176
RDW: 20.4 — ABNORMAL HIGH

## 2011-07-14 LAB — BASIC METABOLIC PANEL
BUN: 7
CO2: 21
Chloride: 107
Glucose, Bld: 115 — ABNORMAL HIGH
Potassium: 3.4 — ABNORMAL LOW

## 2011-07-17 ENCOUNTER — Encounter (HOSPITAL_COMMUNITY): Payer: Medicare Other | Admitting: Psychiatry

## 2011-07-17 ENCOUNTER — Telehealth: Payer: Self-pay | Admitting: Orthopedic Surgery

## 2011-07-17 DIAGNOSIS — M25569 Pain in unspecified knee: Secondary | ICD-10-CM

## 2011-07-17 LAB — COMPREHENSIVE METABOLIC PANEL
AST: 21
Albumin: 3.8
Chloride: 105
Creatinine, Ser: 0.9
GFR calc Af Amer: >60
Potassium: 3.6
Total Bilirubin: 0.5
Total Protein: 6.3

## 2011-07-17 LAB — CBC
MCV: 92.7
Platelets: 155
RDW: 17 — ABNORMAL HIGH
WBC: 4.4

## 2011-07-17 LAB — DIFFERENTIAL
Basophils Absolute: 0
Eosinophils Relative: 0
Lymphocytes Relative: 16
Monocytes Absolute: 0.8
Monocytes Relative: 18 — ABNORMAL HIGH
Neutro Abs: 2.9

## 2011-07-17 MED ORDER — PREDNISONE (PAK) 10 MG PO TABS: 10.0000 mg | ORAL_TABLET | Freq: Every day | ORAL | Status: AC

## 2011-07-17 NOTE — Telephone Encounter (Signed)
Faxed over dose pak

## 2011-07-17 NOTE — Telephone Encounter (Signed)
Forwarding to nurse.

## 2011-07-17 NOTE — Telephone Encounter (Signed)
Dose pack 10 mg 12 days

## 2011-07-17 NOTE — Telephone Encounter (Signed)
Jessica Reilly 07/17/2011 2:31 PM Signed  A[place] Coupe to nurse Fuller Canada, MD 07/17/2011 11:11 AM Signed  Dose pack 10 mg 12 days  Jessica Reilly 07/13/2011 3:47 PM Signed  Patient called to ask if Dr. Romeo Apple would order Prednisone for her bilateral knee pain, if Dr. Romeo Apple thinks it would help. She said it had helped her in the past. She has an appointment scheduled 08/09/11. I relayed that Rx requests need to be received by noon on Thursdays, otherwise Monday is the next day that requests are done.  She has given her pharmacy as [place] if medication is prescribed. Her cell ph# is [phone].          Routing History        From  To  Priority    07/17/2011 11:11 AM  Fuller Canada, MD  Jessica Reilly  Routine    07/13/2011 3:47 PM  Virgia Land, MD  Routine

## 2011-07-18 LAB — CBC
HCT: 32.2 — ABNORMAL LOW
Hemoglobin: 11.2 — ABNORMAL LOW
MCHC: 34.8
MCHC: 34.4
MCV: 90.8
MCV: 90.5
RBC: 3.55 — ABNORMAL LOW
RBC: 3.67 — ABNORMAL LOW
RDW: 15
RDW: 13.5

## 2011-07-18 LAB — COMPREHENSIVE METABOLIC PANEL
ALT: 27
AST: 23
CO2: 28
Calcium: 9.2
Creatinine, Ser: 1
GFR calc Af Amer: >60
GFR calc non Af Amer: 59 — ABNORMAL LOW
Sodium: 142
Total Protein: 6.4

## 2011-07-18 LAB — DIFFERENTIAL
Basophils Absolute: 0
Basophils Relative: 0
Eosinophils Absolute: 0 — ABNORMAL LOW
Eosinophils Relative: 0
Eosinophils Relative: 0
Lymphocytes Relative: 14
Lymphs Abs: 1.2
Monocytes Absolute: 0.9
Monocytes Relative: 8
Monocytes Relative: 7
Neutrophils Relative %: 79 — ABNORMAL HIGH

## 2011-07-19 LAB — DIFFERENTIAL
Basophils Relative: 0
Eosinophils Absolute: 0.1
Eosinophils Relative: 1
Lymphs Abs: 1.2
Monocytes Relative: 8

## 2011-07-19 LAB — CBC
HCT: 35.1 — ABNORMAL LOW
MCHC: 34.6
MCV: 90
RBC: 3.9
WBC: 6.9

## 2011-07-20 LAB — COMPREHENSIVE METABOLIC PANEL
ALT: 24
AST: 21
Albumin: 4.1
BUN: 8
Calcium: 8.4
Calcium: 9.3
Chloride: 106
Creatinine, Ser: 0.92
GFR calc Af Amer: >60
Sodium: 138
Total Bilirubin: 0.7
Total Protein: 6.4

## 2011-07-20 LAB — DIFFERENTIAL
Basophils Absolute: 0
Eosinophils Absolute: 0.1
Eosinophils Relative: 1
Lymphocytes Relative: 40
Lymphs Abs: 4.1 — ABNORMAL HIGH
Lymphs Abs: 4.6 — ABNORMAL HIGH
Monocytes Absolute: 0.4
Monocytes Relative: 6
Neutro Abs: 5.4

## 2011-07-20 LAB — CBC
HCT: 36.1
HCT: 40.6
Hemoglobin: 12.2
MCHC: 34.3
MCV: 89.7
Platelets: 342
RDW: 13.3
RDW: 13.7
WBC: 10.3
WBC: 9.8

## 2011-07-20 LAB — URINALYSIS, ROUTINE W REFLEX MICROSCOPIC
Bilirubin Urine: NEGATIVE
Glucose, UA: NEGATIVE
Hgb urine dipstick: NEGATIVE
Ketones, ur: NEGATIVE
Protein, ur: NEGATIVE
Urobilinogen, UA: 0.2

## 2011-08-09 ENCOUNTER — Ambulatory Visit: Payer: Medicare Other | Admitting: Orthopedic Surgery

## 2011-08-10 ENCOUNTER — Encounter (HOSPITAL_COMMUNITY): Payer: Medicare Other | Admitting: Psychiatry

## 2011-08-23 ENCOUNTER — Encounter (HOSPITAL_COMMUNITY): Payer: No Typology Code available for payment source | Admitting: Psychiatry

## 2011-08-23 ENCOUNTER — Encounter (HOSPITAL_COMMUNITY): Payer: Self-pay | Admitting: Psychiatry

## 2011-08-23 ENCOUNTER — Ambulatory Visit (INDEPENDENT_AMBULATORY_CARE_PROVIDER_SITE_OTHER): Payer: No Typology Code available for payment source | Admitting: Psychiatry

## 2011-08-23 DIAGNOSIS — F319 Bipolar disorder, unspecified: Secondary | ICD-10-CM

## 2011-08-23 NOTE — Patient Instructions (Signed)
Practice diaphragmatic breathing, practice mindfulness activity using walking, improve self-care regarding nutrition

## 2011-08-23 NOTE — Progress Notes (Addendum)
Patient:  Jessica Reilly   DOB: 10-02-1961  MR Number: [id number]  Location: [facility]:  [address] [place] [place],  [place], [address]  Start: Wednesday 08/23/2011 10:30 AM End: Wednesday 08/23/2011 11:30 AM  Provider/Observer:     Florencia Reasons, MSW, LCSW   Chief Complaint:      Chief Complaint  Patient presents with  . Other    Mood disturbances  . Anxiety    Reason For Service:     The patient was referred for services by psychiatrist Dr. Lolly Mustache to improve coping and social skills. The patient has a history of severe mood swings, anxiety, and difficulty managing anger.  Interventions Strategy:  Supportive therapy cognitive behavioral therapy  Participation Level:   Active  Participation Quality:  Monopolizing      Behavioral Observation:  Well Groomed, rapid speech, anxious   Current Psychosocial Factors: The patient's father who is in prison has a diagnosis of cancer and recently had his leg amputated. He is critically ill and is in the prison hospital. Patient reports recent arguments with her brother and sister and states they are no longer talking to each other. She is experiencing financial stress due to bills.  Content of Session:   Reviewing symptoms, identifying stressors, identifying coping and relaxation techniques  Current Status:   Patient reports increased anxiety, panic attacks, sleep difficulty, increased memory difficulty, paranoia, and self-injurious behaviors. She denies any suicidal and homicidal ideations. She reports sometimes seeing flashes of light. She also reports sometimes feeling disoriented and says that situations sometimes do not seem real.  Patient Progress:   Poor. Patient reports increased stress as her father is critically ill and recently had his leg amputated. He is in the prison hospital at [facility] in [place]. Patient reports spending several days in [place] visiting her father. She expresses anger and frustration that her  brother and sister were not as interested in seeing their father as patient was. She reports becoming angry and frustrated with other family members prior to learning that father was in the hospital. She reports that she had been experiencing increased self-injurious behaviors including slamming her hands in drawers, hitting herself in her chest, and pulling her hair. She reports eventually using a switch blade to cut her arm after carving the word "lost"  in her arm. Patient reports experiencing relief and pleasure when she saw the blood coming from her arm. She also reports taking a picture of the blood. Patient denies having any other self injurious behaviors since that incident.  Patient reports she has not been compliant with taking medication as she was out-of-town. She also reports difficulty remembering to take her medication. Therapist worked with patient to identify ways to improve medication compliance. She also reports she is out of medication. She is scheduled to see Dr. Lolly Mustache  for medication management next week.. Therapist worked with patient to identify and practice relaxation and coping techniques including diaphragmatic breathing and a walking meditation.  Target Goals:   1. Decrease/eliminate self-injurious behaviors and anger outbursts. 2. Decrease panic attacks and excessive worry.   Last Reviewed:   11/22/2010   Goals Addressed Today:    Decrease/eliminate self-injurious behaviors and anger outbursts. Decrease panic attacks excessive worry.  Impression/Diagnosis:   The patient has a history of mood swings, anger, depression, insomnia, anxiety, and panic attacks along with irritability. She reports becoming angry easily and having a short fuse. The patient also experiences paranoia, hallucinations, and disturbing intrusive thoughts. Diagnosis of bipolar  disorder NOS, rule out schizophrenia   Diagnosis:  Axis I:  1. Bipolar disorder             Axis II: Deferred

## 2011-09-02 ENCOUNTER — Other Ambulatory Visit (HOSPITAL_COMMUNITY): Payer: Self-pay | Admitting: Psychiatry

## 2011-09-06 ENCOUNTER — Encounter (HOSPITAL_COMMUNITY): Payer: Self-pay | Admitting: Psychiatry

## 2011-09-06 ENCOUNTER — Ambulatory Visit (INDEPENDENT_AMBULATORY_CARE_PROVIDER_SITE_OTHER): Payer: No Typology Code available for payment source | Admitting: Psychiatry

## 2011-09-06 DIAGNOSIS — F319 Bipolar disorder, unspecified: Secondary | ICD-10-CM

## 2011-09-06 NOTE — Patient Instructions (Signed)
Continue to use relaxation breathing, use mindfulness activity using walking meditation, keep scheduled appointment with Dr. Lolly Mustache

## 2011-09-06 NOTE — Progress Notes (Signed)
Patient:  Jessica Reilly   DOB: 04-28-1961  MR Number: [id number]  Location: [facility]:  [address] [place] [place],  [place], [address]  Start: Wednesday 09/06/2011 10:30 AM End: Wednesday 09/06/2011 11:30 AM  Provider/Observer:     Florencia Reasons, MSW, LCSW   Chief Complaint:      Chief Complaint  Patient presents with  . Other    Mood Disturbances  . Anxiety    Reason For Service:     The patient was referred for services by psychiatrist Dr. Lolly Mustache to improve coping and social skills. The patient has a history of severe mood swings, anxiety, and difficulty managing anger.  Interventions Strategy:  Supportive therapy cognitive behavioral therapy  Participation Level:   Active  Participation Quality:  Monopolizing      Behavioral Observation:  Well Groomed, rapid speech, anxious   Current Psychosocial Factors: The patient's father who is in prison has a diagnosis of cancer and recently had his leg amputated. He is critically ill and is in the prison hospital. Patient reports stress regarding assisting sister with end of lige plans for father. She is experiencing financial stress due to bills.  Content of Session:   Reviewing symptoms, identifying stressors, processing feelings, identifying coping and relaxation techniques  Current Status:   Patient reports continued anxiety, panic attacks, sleep difficulty, increased memory difficulty, and paranoia. She denies any self-injurious behaviors since last session. She denies any suicidal and homicidal ideations but does report intrusive violent thoughts. She reports she has not acted on any of the thoughts.  Patient Progress:   Poor. Patient continues to experience significant stress related to her family as well as her mental illness. She reports she has had increased intrusive thoughts about hurting herself and others. However, she has been able to refrain from acting on the thoughts. Patient reports no plan or intent of  harming herself or anyone else. She is frightened by the intrusive thoughts and fears that she may eventually hurt herself or hurt someone else. She also expresses fear of the fixation she had on seeing blood flow freely from her arm after she had cut it 3 weeks ago. Patient agrees to call this practice, call 911, or have someone take her to the emergency room should the  intrusive thoughts become overwhelming. She also agrees to avoid being around her infant cousin alone as she had an intrusive violent thought about dropping the baby on the couch yesterday. She was mistaken about the date of her last appointment with Dr. Lolly Mustache but  is scheduled to see him for medication management tomorrow. Therapist worked with patient to identify the importance of medication compliance. Therapist also worked with patient to identify and practice relaxation and coping techniques including a mindfulness activity using breath awareness.  Target Goals:   1. Decrease/eliminate self-injurious behaviors and anger outbursts. 2. Decrease panic attacks and excessive worry.   Last Reviewed:   11/22/2010   Goals Addressed Today:    Decrease/eliminate self-injurious behaviors and anger outbursts. Decrease panic attacks excessive worry.  Impression/Diagnosis:   The patient has a history of mood swings, anger, depression, insomnia, anxiety, and panic attacks along with irritability. She reports becoming angry easily and having a short fuse. The patient also experiences paranoia, hallucinations, and disturbing intrusive thoughts. Diagnosis of bipolar disorder NOS, rule out schizophrenia   Diagnosis:  Axis I:  1. Bipolar disorder             Axis II: Deferred

## 2011-09-07 ENCOUNTER — Encounter (HOSPITAL_COMMUNITY): Payer: Medicare Other | Admitting: Psychiatry

## 2011-09-07 ENCOUNTER — Ambulatory Visit (HOSPITAL_COMMUNITY): Payer: Medicare Other | Admitting: Psychiatry

## 2011-09-15 ENCOUNTER — Ambulatory Visit (HOSPITAL_COMMUNITY): Payer: No Typology Code available for payment source | Admitting: Psychiatry

## 2011-11-06 ENCOUNTER — Encounter (HOSPITAL_COMMUNITY): Payer: Self-pay | Admitting: Psychiatry

## 2011-11-06 ENCOUNTER — Ambulatory Visit (INDEPENDENT_AMBULATORY_CARE_PROVIDER_SITE_OTHER): Payer: No Typology Code available for payment source | Admitting: Psychiatry

## 2011-11-06 DIAGNOSIS — F319 Bipolar disorder, unspecified: Secondary | ICD-10-CM

## 2011-11-06 NOTE — Progress Notes (Signed)
Patient:  Jessica Reilly   DOB: 29-Mar-1961  MR Number: [id number]  Location: [facility]:  [address] [place],  [place], [address]  Start: Monday 11/06/2011 8:35 AM End: Monday 11/06/2011 9:25 AM  Provider/Observer:     Florencia Reasons, MSW, LCSW   Chief Complaint:      Chief Complaint  Patient presents with  . Anxiety  . Other    Mood Disturbances    Reason For Service:     The patient was referred for services by psychiatrist Dr. Lolly Mustache to improve coping and social skills. The patient has a history of severe mood swings, anxiety, and difficulty managing anger.  Interventions Strategy:  Supportive therapy cognitive behavioral therapy  Participation Level:   Active  Participation Quality:  Monopolizing      Behavioral Observation:  Well Groomed, rapid speech, anxious, anfry  Current Psychosocial Factors: The patient's father who is in prison has a diagnosis of cancer and recently had his leg amputated. He is critically ill and is in the prison hospital. Patient continues to struggle with her health issues. The patient also reports recent conflict with her relatives..  Content of Session:   Reviewing symptoms, identifying stressors, processing feelings, identifying coping and relaxation techniques, identifying reasonable versus realistic expectations  Current Status:   Patient reports continued anxiety, panic attacks, sleep difficulty, memory difficulty, and paranoia. She denies any suicidal and homicidal ideations as well as any intrusive violent thoughts. The patient also denies any hallucinations.  Patient Progress:   Fair. Patient continues to experience significant stress related to her family as well as her mental illness. She reports increased thoughts and fear that she may have cancer again in. She is scheduled to go to [facility] in February for a checkup. Patient continues to express anger and frustration as well as insecurity and poor self esteem related  to having a double mastectomy as well as experiencing hair loss related to her previous experience with cancer. She also continues to worry about her father who remains incarcerated and has terminal illness. Patient reports she has not been taking any psychotropic medication but reports she is scheduled to see Dr. Lolly Mustache for medication evaluation in February. Target Goals:   1. Decrease/eliminate self-injurious behaviors and anger outbursts. 2. Decrease panic attacks and excessive worry.   Last Reviewed:   11/22/2010   Goals Addressed Today:    Decrease panic attacks excessive worry.  Impression/Diagnosis:   The patient has a history of mood swings, anger, depression, insomnia, anxiety, and panic attacks along with irritability. She reports becoming angry easily and having a short fuse. The patient also experiences paranoia, hallucinations, and disturbing intrusive thoughts. Diagnosis of bipolar disorder NOS, rule out schizophrenia   Diagnosis:  Axis I:  1. Bipolar disorder             Axis II: Deferred

## 2011-11-06 NOTE — Patient Instructions (Signed)
Use relaxation techniques - relaxation breathing, walking meditation

## 2011-11-16 ENCOUNTER — Ambulatory Visit (HOSPITAL_COMMUNITY): Payer: No Typology Code available for payment source | Admitting: Psychiatry

## 2011-12-25 ENCOUNTER — Ambulatory Visit (INDEPENDENT_AMBULATORY_CARE_PROVIDER_SITE_OTHER): Payer: Medicare Other | Admitting: Internal Medicine

## 2011-12-25 ENCOUNTER — Encounter (INDEPENDENT_AMBULATORY_CARE_PROVIDER_SITE_OTHER): Payer: Self-pay | Admitting: Internal Medicine

## 2011-12-25 VITALS — BP 130/82 | HR 78 | Temp 97.6°F | Resp 18 | Ht 62.0 in | Wt 207.3 lb

## 2011-12-25 DIAGNOSIS — R1032 Left lower quadrant pain: Secondary | ICD-10-CM | POA: Insufficient documentation

## 2011-12-25 DIAGNOSIS — K589 Irritable bowel syndrome without diarrhea: Secondary | ICD-10-CM

## 2011-12-25 MED ORDER — HYOSCYAMINE SULFATE 0.125 MG SL SUBL: 0.1250 mg | SUBLINGUAL_TABLET | Freq: Three times a day (TID) | SUBLINGUAL | Status: DC | PRN

## 2011-12-25 NOTE — Progress Notes (Signed)
Presenting complaint;  Intermittent LLQ abdominal pain and constipation. History of colonic polyps. Subjective:  Jessica Reilly is 51 year old Caucasian female who is here for scheduled visit. She was last seen by me in [place] office in August 2009. Following that visit she had colonoscopy on 05/22/2008. She had 3 small polyps ablated via cold biopsy. 2 were tubular adenomas and one was hyperplastic polyp. She also had healing stroke or ulcers felt to be secondary to impaction. She complains of intermittent LLQ abdominal pain. She says since she's been on stool softener her bowels been generally moving with ease every other day. She denies melena or rectal bleeding anorexia or weight loss. She is trying to consume fiber rich foods and drinks plenty of water. Her appetite is fair and her weight has been stable. 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 She is seeing an oncologist at [facility].  Current Medications: Current Outpatient Prescriptions  Medication Sig Dispense Refill  . alprazolam (XANAX) 2 MG tablet Take 2 mg by mouth at bedtime as needed.        Marland Kitchen anastrozole (ARIMIDEX) 1 MG tablet Take 1 mg by mouth daily.        . Cholecalciferol (VITAMIN D) 1000 UNITS capsule Take 1,000 Units by mouth daily.      [name] Calcium (STOOL SOFTENER PO) Take by mouth.        . Enoxaparin Sodium (LOVENOX IJ) Inject 90 mLs as directed 2 (two) times daily.      Marland Kitchen esomeprazole (NEXIUM) 40 MG packet Take 40 mg by mouth daily before breakfast. Patient takes twice a day      . lidocaine (LIDODERM) 5 % Place 1 patch onto the skin daily. Remove & Discard patch within 12 hours or as directed by MD       . metoprolol (TOPROL-XL) 100 MG 24 hr tablet Take 100 mg by mouth daily.        . simvastatin (ZOCOR) 20 MG tablet Take 20 mg by mouth at bedtime.        Marland Kitchen warfarin (COUMADIN) 2 MG tablet Take 2 mg by mouth daily. [name]      . benztropine (COGENTIN) 0.5 MG tablet Take 0.5 mg by mouth 2 (two) times daily.         Current Facility-Administered Medications  Medication Dose Route Frequency Provider Last Rate Last Dose  . methylPREDNISolone acetate (DEPO-MEDROL) injection 40 mg  40 mg Intra-articular Once Vickki Hearing, MD      . methylPREDNISolone acetate (DEPO-MEDROL) injection 40 mg  40 mg Intra-articular Once Vickki Hearing, MD       Past medical history; Depression of several years duration. Right breast carcinoma diagnosed in 2008 treated with chemotherapy followed by a double mastectomy in January 2009. This was then followed by radiation therapy. She had prophylactic laparoscopic oophorectomy as she  was felt to be high risk based on genetic testing. Tonsillectomy as a child. Hyperlipidemia. Chronic GERD. Hypertension. Obesity. Bilateral knee osteoarthrosis. Recent diagnosis of factor V deficiency. History of clotting in both upper extremities. History of kidney stones. Asymptomatic cholelithiasis. Allergies; cefuroxime Axetil, amoxicillin and clavulanic acid. Family history significant for colorectal carcinoma and a father at age 14 and died last month at 89 of unrelated causes.   Objective: Blood pressure 130/82, pulse 78, temperature 97.6 F (36.4 C), temperature source Oral, resp. rate  18, height 5\' 2"  (1.575 m), weight 207 lb 4.8 oz (94.031 kg).  Conjunctiva is pink. Sclera is nonicteric Oropharyngeal mucosa is normal. No neck masses or thyromegaly noted. Cardiac exam with regular rhythm normal S1 and S2. No murmur or gallop noted. Lungs are clear to auscultation. Abdomen is full. Bowel sounds are normal. Palpation reveals soft abdomen without organomegaly or masses. She has few areas with ecchymosis in right lower quadrant where she has mild tenderness.  No LE edema or clubbing noted.    Assessment:  #1. Intermittent LLQ abdominal pain in the setting  of constipation appears to be secondary to IBS. She does not have a lot of symptoms. #2. History of colonic adenomas and family history of colon carcinoma as above. Last colonoscopy was in August 2009.   Plan:  Levsin as at 1 3 times a day when necessary. High-fiber diet. Continue Colace 2 tablets by mouth daily. Use glycerin or Dulcolax suppository per rectum on as-needed basis and if no BM in 2 days. Office visit in 6 months. Next colonoscopy in August 2014.

## 2011-12-25 NOTE — Patient Instructions (Addendum)
Hyoscyamine oh 0.125 mg sublingual up to 3 times a day as needed for left-sided abdominal pain. Continue Colace or stool softener 2 tablets by mouth daily at bedtime. High fiber diet. Can use glycerin or Dulcolax suppository per rectum on an as-needed basis if you do not have a bowel movement every 2 days.

## 2011-12-26 ENCOUNTER — Other Ambulatory Visit: Payer: Self-pay | Admitting: Adult Health

## 2011-12-26 ENCOUNTER — Other Ambulatory Visit (HOSPITAL_COMMUNITY)
Admission: RE | Admit: 2011-12-26 | Discharge: 2011-12-26 | Disposition: A | Discharge status: Home / Self Care | Payer: Medicare Other | Source: Ambulatory Visit | Attending: Obstetrics and Gynecology | Admitting: Obstetrics and Gynecology

## 2011-12-26 DIAGNOSIS — R599 Enlarged lymph nodes, unspecified: Secondary | ICD-10-CM

## 2011-12-26 DIAGNOSIS — Z01419 Encounter for gynecological examination (general) (routine) without abnormal findings: Secondary | ICD-10-CM | POA: Insufficient documentation

## 2011-12-29 ENCOUNTER — Emergency Department (HOSPITAL_COMMUNITY)
Admission: EM | Admit: 2011-12-29 | Discharge: 2011-12-29 | Disposition: A | Discharge status: Home / Self Care | Payer: Medicare Other | Attending: Emergency Medicine | Admitting: Emergency Medicine

## 2011-12-29 ENCOUNTER — Encounter (HOSPITAL_COMMUNITY): Payer: Self-pay | Admitting: Emergency Medicine

## 2011-12-29 DIAGNOSIS — Z853 Personal history of malignant neoplasm of breast: Secondary | ICD-10-CM | POA: Insufficient documentation

## 2011-12-29 DIAGNOSIS — E7800 Pure hypercholesterolemia, unspecified: Secondary | ICD-10-CM | POA: Insufficient documentation

## 2011-12-29 DIAGNOSIS — R109 Unspecified abdominal pain: Secondary | ICD-10-CM | POA: Insufficient documentation

## 2011-12-29 DIAGNOSIS — T148XXA Other injury of unspecified body region, initial encounter: Secondary | ICD-10-CM

## 2011-12-29 DIAGNOSIS — I10 Essential (primary) hypertension: Secondary | ICD-10-CM | POA: Insufficient documentation

## 2011-12-29 DIAGNOSIS — M1990 Unspecified osteoarthritis, unspecified site: Secondary | ICD-10-CM | POA: Insufficient documentation

## 2011-12-29 DIAGNOSIS — K219 Gastro-esophageal reflux disease without esophagitis: Secondary | ICD-10-CM | POA: Insufficient documentation

## 2011-12-29 NOTE — ED Provider Notes (Signed)
History     CSN: [id number]  Arrival date & time 12/29/11  4098   First MD Initiated Contact with Patient 12/29/11 2052      Chief Complaint  Patient presents with  . Abdominal Pain    (Consider location/radiation/quality/duration/timing/severity/associated sxs/prior treatment) Patient is a 51 y.o. female presenting with abdominal pain. The history is provided by the patient.  Abdominal Pain The primary symptoms of the illness include abdominal pain.   patient here with right lower quadrant abdominal pain at the injection site where she was given some Lovenox. Symptoms have been there for a week and she notes increased bruising. No dizziness, lightheadedness, fever. No urinary symptoms. Patient has a history of factor V deficiency as also been started on Coumadin which she had an INR today which was 2.5 which is therapeutic. No therapy done prior to arrival. Symptoms worse with movement better with remaining still  Past Medical History  Diagnosis Date  . HTN (hypertension)   . Acid reflux   . High cholesterol   . HX: breast cancer   . Gallstones   . Kidney stones   . Fibroid   . Heart murmur   . Osteoarthritis     Past Surgical History  Procedure Date  . Mastectomy bilateral  . Breast lumpectomy   . Ovaries removed   . Tonsillectomy and adenoidectomy   . Lymph node biopsy     Family History  Problem Relation Age of Onset  . Alcohol abuse Mother   . COPD Mother   . Alcohol abuse Father   . Rectal cancer Father   . Heart disease Father   . Pneumonia Father   . OCD Daughter   . Bipolar disorder Daughter   . Hypertension Daughter   . Schizophrenia Daughter   . Bipolar disorder Daughter   . OCD Daughter   . Hypertension Daughter   . COPD Daughter   . ADD / ADHD Grandchild   . Anxiety disorder Grandchild   . Depression Grandchild   . Healthy Sister     History  Substance Use Topics  . Smoking status: Never Smoker   . Smokeless tobacco: Never Used  .  Alcohol Use: No    OB History    Grav Para Term Preterm Abortions TAB SAB Ect Mult Living                  Review of Systems  Gastrointestinal: Positive for abdominal pain.  All other systems reviewed and are negative.    Allergies  Cefuroxime axetil and JXB:JYNWGNFAOZH+YQMVHQION+GEXBMWUXLK acid+aspartame  Home Medications   Current Outpatient Rx  Name Route Sig Dispense Refill  . ALPRAZOLAM 2 MG PO TABS Oral Take 2 mg by mouth at bedtime as needed. For anxiety    . ANASTROZOLE 1 MG PO TABS Oral Take 1 mg by mouth daily.      Marland Kitchen VITAMIN D 1000 UNITS PO CAPS Oral Take 1,000 Units by mouth daily.    . STOOL SOFTENER PO Oral Take 1 capsule by mouth.     Marland Kitchen LOVENOX IJ Injection Inject 90 mLs as directed 2 (two) times daily.    Marland Kitchen ESOMEPRAZOLE MAGNESIUM 40 MG PO PACK Oral Take 40 mg by mouth daily before breakfast. Patient takes twice a day    . HYOSCYAMINE SULFATE 0.125 MG SL SUBL Sublingual Place 0.125 mg under the tongue 3 (three) times daily as needed. For cramping    . LIDOCAINE 5 % EX PTCH Transdermal Place 1 patch onto  the skin daily. Remove & Discard patch within 12 hours or as directed by MD     . METOPROLOL SUCCINATE ER 100 MG PO TB24 Oral Take 100 mg by mouth daily.      Marland Kitchen SIMVASTATIN 20 MG PO TABS Oral Take 20 mg by mouth at bedtime.      . WARFARIN SODIUM 2 MG PO TABS Oral Take 2 mg by mouth daily. Patient takes 3 1/2 mg daily      BP 115/83  Pulse 77  Temp(Src) 98.6 F (37 C) (Oral)  Resp 20  SpO2 96%  Physical Exam  Nursing note and vitals reviewed. Constitutional: She is oriented to person, place, and time. She appears well-developed and well-nourished.  Non-toxic appearance. No distress.  HENT:  Head: Normocephalic and atraumatic.  Eyes: Conjunctivae, EOM and lids are normal. Pupils are equal, round, and reactive to light.  Neck: Normal range of motion. Neck supple. No tracheal deviation present. No mass present.  Cardiovascular: Normal rate, regular rhythm  and normal heart sounds.  Exam reveals no gallop.   No murmur heard. Pulmonary/Chest: Effort normal and breath sounds normal. No stridor. No respiratory distress. She has no decreased breath sounds. She has no wheezes. She has no rhonchi. She has no rales.  Abdominal: Soft. Normal appearance and bowel sounds are normal. She exhibits no distension. There is no tenderness. There is no rebound and no CVA tenderness.    Musculoskeletal: Normal range of motion. She exhibits no edema and no tenderness.  Neurological: She is alert and oriented to person, place, and time. She has normal strength. No cranial nerve deficit or sensory deficit. GCS eye subscore is 4. GCS verbal subscore is 5. GCS motor subscore is 6.  Skin: Skin is warm and dry. No abrasion and no rash noted.  Psychiatric: She has a normal mood and affect. Her speech is normal and behavior is normal.    ED Course  Procedures (including critical care time)   Labs Reviewed  CBC  DIFFERENTIAL  BASIC METABOLIC PANEL   No results found.   No diagnosis found.    MDM  Patient had INR today and were not repeated at this time. Symptoms likely from patient's Lovenox injections which she has stopped. Patient given instructions on supportive care and will return if the area increases in size        Toy Baker, MD 12/29/11 2128

## 2011-12-29 NOTE — ED Notes (Signed)
Patient giving self Lovenox injection abdomen however RLQ pain where patient injected ecchymosis light dark purple feels hard Pain currently 8/10 achy dull worsening upon movement and palpation.

## 2011-12-29 NOTE — ED Notes (Signed)
Pt is giving herself lovenox injections to tx Factor V deficiency.  4 x 2 inch hematoma to RLQ.  Pain on palpation and bruising.

## 2011-12-29 NOTE — Discharge Instructions (Signed)
Return here for increased size of the bruising, dizziness, worsening pain, or any other problemsHematoma A hematoma is a pocket of blood that collects under the skin, in an organ, in a body space, in a joint space, or in other tissue. The blood can clot to form a lump that you can see and feel. The lump is often firm, sore, and sometimes even painful and tender. Most hematomas get better in a few days to weeks. However, some hematomas may be serious and require medical care.Hematomas can range in size from very small to very large. CAUSES  A hematoma can be caused by a blunt or penetrating injury. It can also be caused by leakage from a blood vessel under the skin. Spontaneous leakage from a blood vessel is more likely to occur in elderly people, especially those taking blood thinners. Sometimes, a hematoma can develop after certain medical procedures. SYMPTOMS  Unlike a bruise, a hematoma forms a firm lump that you can feel. This lump is the collection of blood. The collection of blood can also cause your skin to turn a blue to dark blue color. If the hematoma is close to the surface of the skin, it often produces a yellowish color in the skin. DIAGNOSIS  Your caregiver can determine whether you have a hematoma based on your history and a physical exam. TREATMENT  Hematomas usually go away on their own over time. Rarely does the blood need to be drained out of the body. HOME CARE INSTRUCTIONS   Put ice on the injured area.   Put ice in a plastic bag.   Place a towel between your skin and the bag.   Leave the ice on for 15 to 20 minutes, 3 to 4 times a day for the first 1 to 2 days.   After the first 2 days, switch to using warm compresses on the hematoma.   Elevate the injured area to help decrease pain and swelling. Wrapping the area with an elastic bandage may also be helpful. Compression helps to reduce swelling and promotes shrinking of the hematoma. Make sure the bandage is not wrapped  too tight.   If your hematoma is on a lower extremity and is painful, crutches may be helpful for a couple days.   Only take over-the-counter or prescription medicines for pain, discomfort, or fever as directed by your caregiver. Most patients can take acetaminophen or ibuprofen for the pain.  SEEK IMMEDIATE MEDICAL CARE IF:   You have increasing pain, or your pain is not controlled with medicine.   You have a fever.   You have worsening swelling or discoloration.   Your skin over the hematoma breaks or starts bleeding.  MAKE SURE YOU:   Understand these instructions.   Will watch your condition.   Will get help right away if you are not doing well or get worse.  Document Released: 05/09/2004 Document Revised: 09/14/2011 Document Reviewed: 05/29/2011 [facility] Patient Information 2012 [place], [place].

## 2012-01-03 ENCOUNTER — Ambulatory Visit (HOSPITAL_COMMUNITY)
Admission: RE | Admit: 2012-01-03 | Discharge: 2012-01-03 | Disposition: A | Discharge status: Home / Self Care | Payer: Medicare Other | Source: Ambulatory Visit | Attending: Adult Health | Admitting: Adult Health

## 2012-01-03 ENCOUNTER — Other Ambulatory Visit: Payer: Self-pay | Admitting: Adult Health

## 2012-01-03 DIAGNOSIS — R599 Enlarged lymph nodes, unspecified: Secondary | ICD-10-CM | POA: Insufficient documentation

## 2012-01-03 DIAGNOSIS — Z9010 Acquired absence of unspecified breast and nipple: Secondary | ICD-10-CM | POA: Insufficient documentation

## 2012-01-10 ENCOUNTER — Emergency Department (HOSPITAL_COMMUNITY)
Admission: EM | Admit: 2012-01-10 | Discharge: 2012-01-10 | Disposition: A | Discharge status: Home / Self Care | Payer: Medicare Other | Attending: Emergency Medicine | Admitting: Emergency Medicine

## 2012-01-10 ENCOUNTER — Encounter (HOSPITAL_COMMUNITY): Payer: Self-pay | Admitting: *Deleted

## 2012-01-10 DIAGNOSIS — J069 Acute upper respiratory infection, unspecified: Secondary | ICD-10-CM | POA: Insufficient documentation

## 2012-01-10 DIAGNOSIS — I10 Essential (primary) hypertension: Secondary | ICD-10-CM | POA: Insufficient documentation

## 2012-01-10 DIAGNOSIS — R51 Headache: Secondary | ICD-10-CM | POA: Insufficient documentation

## 2012-01-10 DIAGNOSIS — K219 Gastro-esophageal reflux disease without esophagitis: Secondary | ICD-10-CM | POA: Insufficient documentation

## 2012-01-10 DIAGNOSIS — Z853 Personal history of malignant neoplasm of breast: Secondary | ICD-10-CM | POA: Insufficient documentation

## 2012-01-10 DIAGNOSIS — R0982 Postnasal drip: Secondary | ICD-10-CM | POA: Insufficient documentation

## 2012-01-10 DIAGNOSIS — R070 Pain in throat: Secondary | ICD-10-CM | POA: Insufficient documentation

## 2012-01-10 DIAGNOSIS — H9209 Otalgia, unspecified ear: Secondary | ICD-10-CM | POA: Insufficient documentation

## 2012-01-10 HISTORY — DX: Activated protein C resistance: D68.51

## 2012-01-10 MED ORDER — OXYCODONE-ACETAMINOPHEN 5-325 MG PO TABS: 1.0000 | ORAL_TABLET | Freq: Four times a day (QID) | ORAL | Status: AC | PRN

## 2012-01-10 MED ORDER — PSEUDOEPHEDRINE HCL 60 MG PO TABS
60.0000 mg | ORAL_TABLET | Freq: Once | ORAL | Status: AC
  Administered 2012-01-10: 60 mg via ORAL
  Filled 2012-01-10: qty 1

## 2012-01-10 MED ORDER — DOXYCYCLINE HYCLATE 100 MG PO TABS
100.0000 mg | ORAL_TABLET | Freq: Once | ORAL | Status: AC
  Administered 2012-01-10: 100 mg via ORAL
  Filled 2012-01-10: qty 1

## 2012-01-10 MED ORDER — OXYCODONE-ACETAMINOPHEN 5-325 MG PO TABS
1.0000 | ORAL_TABLET | Freq: Once | ORAL | Status: AC
  Administered 2012-01-10: 1 via ORAL
  Filled 2012-01-10: qty 1

## 2012-01-10 MED ORDER — AZITHROMYCIN 250 MG PO TABS: ORAL_TABLET | ORAL | Status: DC

## 2012-01-10 MED ORDER — ONDANSETRON 4 MG PO TBDP
4.0000 mg | ORAL_TABLET | Freq: Once | ORAL | Status: AC
  Administered 2012-01-10: 4 mg via ORAL
  Filled 2012-01-10: qty 1

## 2012-01-10 MED ORDER — AZITHROMYCIN 250 MG PO TABS
500.0000 mg | ORAL_TABLET | Freq: Once | ORAL | Status: AC
  Administered 2012-01-10: 500 mg via ORAL
  Filled 2012-01-10: qty 2

## 2012-01-10 NOTE — ED Notes (Signed)
Bil ear pain esp lt ear, sore throat, nasal congestion. headache

## 2012-01-10 NOTE — ED Provider Notes (Signed)
History     CSN: [id number]  Arrival date & time 01/10/12  1858   First MD Initiated Contact with Patient 01/10/12 1930      Chief Complaint  Patient presents with  . Otalgia    (Consider location/radiation/quality/duration/timing/severity/associated sxs/prior treatment) Patient is a 51 y.o. female presenting with ear pain. The history is provided by the patient.  Otalgia This is a new problem. The current episode started more than 2 days ago. There is pain in both ears. The problem occurs daily. The problem has been gradually worsening. There has been no fever. The pain is severe. Associated symptoms include headaches and sore throat. Pertinent negatives include no abdominal pain, no neck pain and no cough. Her past medical history does not include chronic ear infection or tympanostomy tube.    Past Medical History  Diagnosis Date  . HTN (hypertension)   . Acid reflux   . High cholesterol   . HX: breast cancer   . Gallstones   . Kidney stones   . Fibroid   . Heart murmur   . Osteoarthritis   . Factor V Leiden     Past Surgical History  Procedure Date  . Mastectomy bilateral  . Breast lumpectomy   . Ovaries removed   . Tonsillectomy and adenoidectomy   . Lymph node biopsy     Family History  Problem Relation Age of Onset  . Alcohol abuse Mother   . COPD Mother   . Alcohol abuse Father   . Rectal cancer Father   . Heart disease Father   . Pneumonia Father   . OCD Daughter   . Bipolar disorder Daughter   . Hypertension Daughter   . Schizophrenia Daughter   . Bipolar disorder Daughter   . OCD Daughter   . Hypertension Daughter   . COPD Daughter   . ADD / ADHD Grandchild   . Anxiety disorder Grandchild   . Depression Grandchild   . Healthy Sister     History  Substance Use Topics  . Smoking status: Never Smoker   . Smokeless tobacco: Never Used  . Alcohol Use: No    OB History    Grav Para Term Preterm Abortions TAB SAB Ect Mult Living        Review of Systems  Constitutional: Negative for activity change.       All ROS Neg except as noted in HPI  HENT: Positive for ear pain, sore throat and postnasal drip. Negative for nosebleeds and neck pain.   Eyes: Negative for photophobia and discharge.  Respiratory: Negative for cough, shortness of breath and wheezing.   Cardiovascular: Negative for chest pain and palpitations.  Gastrointestinal: Negative for abdominal pain and blood in stool.  Genitourinary: Negative for dysuria, frequency and hematuria.  Musculoskeletal: Negative for back pain and arthralgias.  Skin: Negative.   Neurological: Positive for headaches. Negative for dizziness, seizures and speech difficulty.  Psychiatric/Behavioral: Negative for hallucinations and confusion.    Allergies  Ceftin; Cefuroxime axetil; and WUJ:WJXBJYNWGNF+AOZHYQMVH+QIONGEXBMW acid+aspartame  Home Medications   Current Outpatient Rx  Name Route Sig Dispense Refill  . ALPRAZOLAM 2 MG PO TABS Oral Take 2 mg by mouth at bedtime as needed. For anxiety    . ANASTROZOLE 1 MG PO TABS Oral Take 1 mg by mouth daily.      . AZITHROMYCIN 250 MG PO TABS  1 po daily with food 4 tablet 0  . VITAMIN D 1000 UNITS PO CAPS Oral Take 1,000 Units by mouth  daily.    . STOOL SOFTENER PO Oral Take 1 capsule by mouth.     Marland Kitchen LOVENOX IJ Injection Inject 90 mLs as directed 2 (two) times daily.    Marland Kitchen ESOMEPRAZOLE MAGNESIUM 40 MG PO PACK Oral Take 40 mg by mouth daily before breakfast. Patient takes twice a day    . LIDOCAINE 5 % EX PTCH Transdermal Place 1 patch onto the skin daily. Remove & Discard patch within 12 hours or as directed by MD     . METOPROLOL SUCCINATE ER 100 MG PO TB24 Oral Take 100 mg by mouth daily.      . OXYCODONE-ACETAMINOPHEN 5-325 MG PO TABS Oral Take 1 tablet by mouth every 6 (six) hours as needed for pain. 20 tablet 0  . SIMVASTATIN 20 MG PO TABS Oral Take 20 mg by mouth at bedtime.      . WARFARIN SODIUM 2 MG PO TABS Oral Take 2 mg  by mouth daily. Patient takes 3 1/2 mg daily      BP 119/67  Pulse 98  Temp(Src) 100.3 F (37.9 C) (Oral)  Resp 20  Ht 5\' 2"  (1.575 m)  Wt 202 lb (91.627 kg)  BMI 36.95 kg/m2  SpO2 98%  Physical Exam  Nursing note and vitals reviewed. Constitutional: She is oriented to person, place, and time. She appears well-developed and well-nourished.  Non-toxic appearance.  HENT:  Head: Normocephalic.  Right Ear: Tympanic membrane and external ear normal.  Left Ear: Tympanic membrane and external ear normal.       Wax impaction of the left ear. Pain with movement of the left ear. Nasal congestion noted. No Exudate or ulcers of the tonsils  Eyes: EOM and lids are normal. Pupils are equal, round, and reactive to light.  Neck: Normal range of motion. Neck supple. Carotid bruit is not present.       Palpable nodes of the left neck.  Cardiovascular: Normal rate, regular rhythm, normal heart sounds, intact distal pulses and normal pulses.   Pulmonary/Chest: Breath sounds normal. No respiratory distress.  Abdominal: Soft. Bowel sounds are normal. There is no tenderness. There is no guarding.  Musculoskeletal: Normal range of motion.  Lymphadenopathy:       Head (right side): No submandibular adenopathy present.       Head (left side): No submandibular adenopathy present.    She has no cervical adenopathy.  Neurological: She is alert and oriented to person, place, and time. She has normal strength. No cranial nerve deficit or sensory deficit.  Skin: Skin is warm and dry.  Psychiatric: She has a normal mood and affect. Her speech is normal.    ED Course  Procedures (including critical care time)  Labs Reviewed - No data to display No results found.   1. Otalgia   2. URI (upper respiratory infection)       MDM  *I have reviewed nursing notes, vital signs, and all appropriate lab and imaging results for this patient.** Pt reports improvement of pain from pain meds, but TM ruptured(left)   while in ED. Pt states she mostly feels better. Rx for zithromax and percocet given to the patient.       Kathie Dike, [place] 01/22/12 2142

## 2012-01-10 NOTE — Discharge Instructions (Signed)
Please use of Zithromax 250 mg daily with food starting April 4. Use Tylenol extra strength for mild pain, use Percocet for more severe pain. This medication may cause drowsiness, please use with caution.Otalgia Otalgia is pain in or around the ear. When the pain is from the ear itself it is called primary otalgia. Pain may also be coming from somewhere else, like the head and neck. This is called secondary otalgia.  CAUSES  Causes of primary otalgia include:  Middle ear infection.   It can also be caused by injury to the ear or infection of the ear canal (swimmer's ear). Swimmer's ear causes pain, swelling and often drainage from the ear canal.  Causes of secondary otalgia include:  Sinus infections.   Allergies and colds that cause stuffiness of the nose and tubes that drain the ears (eustachian tubes).   Dental problems like cavities, gum infections or teething.   Sore Throat (tonsillitis and pharyngitis).   Swollen glands in the neck.   Infection of the bone behind the ear (mastoiditis).   TMJ discomfort (problems with the joint between your jaw and your skull).   Other problems such as nerve disorders, circulation problems, heart disease and tumors of the head and neck can also cause symptoms of ear pain. This is rare.  DIAGNOSIS  Evaluation, Diagnosis and Testing:  Examination by your medical caregiver is recommended to evaluate and diagnose the cause of otalgia.   Further testing or referral to a specialist may be indicated if the cause of the ear pain is not found and the symptom persists.  TREATMENT   Your doctor may prescribe antibiotics if an ear infection is diagnosed.   Pain relievers and topical analgesics may be recommended.   It is important to take all medications as prescribed.  HOME CARE INSTRUCTIONS   It may be helpful to sleep with the painful ear in the up position.   A warm compress over the painful ear may provide relief.   A soft diet and avoiding  gum may help while ear pain is present.  SEEK IMMEDIATE MEDICAL CARE IF:  You develop severe pain, a high fever, repeated vomiting or dehydration.   You develop extreme dizziness, headache, confusion, ringing in the ears (tinnitus) or hearing loss.  Document Released: 11/02/2004 Document Revised: 09/14/2011 Document Reviewed: 08/04/2009 [facility] Patient Information 2012 [place], [place].

## 2012-01-10 NOTE — ED Notes (Signed)
EDPa in room with pt. 

## 2012-01-18 ENCOUNTER — Ambulatory Visit (INDEPENDENT_AMBULATORY_CARE_PROVIDER_SITE_OTHER): Payer: Medicare Other | Admitting: Otolaryngology

## 2012-01-18 DIAGNOSIS — H66019 Acute suppurative otitis media with spontaneous rupture of ear drum, unspecified ear: Secondary | ICD-10-CM

## 2012-01-18 DIAGNOSIS — H908 Mixed conductive and sensorineural hearing loss, unspecified: Secondary | ICD-10-CM

## 2012-01-18 DIAGNOSIS — H6980 Other specified disorders of Eustachian tube, unspecified ear: Secondary | ICD-10-CM

## 2012-02-03 NOTE — ED Provider Notes (Signed)
Evaluation and management procedures were performed by the PA/NP/resident physician under my supervision/collaboration.   Jessica Magnan D Woodford Strege, MD 02/03/12 2044 

## 2012-02-08 ENCOUNTER — Ambulatory Visit (INDEPENDENT_AMBULATORY_CARE_PROVIDER_SITE_OTHER): Payer: Medicare Other | Admitting: Otolaryngology

## 2012-02-29 ENCOUNTER — Ambulatory Visit (INDEPENDENT_AMBULATORY_CARE_PROVIDER_SITE_OTHER): Payer: Medicare Other | Admitting: Otolaryngology

## 2012-02-29 DIAGNOSIS — H6980 Other specified disorders of Eustachian tube, unspecified ear: Secondary | ICD-10-CM

## 2012-02-29 DIAGNOSIS — H902 Conductive hearing loss, unspecified: Secondary | ICD-10-CM

## 2012-02-29 DIAGNOSIS — H6520 Chronic serous otitis media, unspecified ear: Secondary | ICD-10-CM

## 2012-03-19 ENCOUNTER — Telehealth: Payer: Self-pay | Admitting: Oncology

## 2012-03-19 ENCOUNTER — Ambulatory Visit (INDEPENDENT_AMBULATORY_CARE_PROVIDER_SITE_OTHER): Payer: Medicare Other | Admitting: Psychiatry

## 2012-03-19 DIAGNOSIS — F319 Bipolar disorder, unspecified: Secondary | ICD-10-CM

## 2012-03-19 NOTE — Telephone Encounter (Signed)
Returned patients call left message for patient to call me back.

## 2012-03-20 NOTE — Patient Instructions (Signed)
Discussed orally 

## 2012-03-20 NOTE — Progress Notes (Addendum)
Patient:  Jessica Reilly   DOB: 1961/01/14  MR Number: [id number]  [facility]:  [address] [place],  [place], [address]  Start: Tuesday 03/19/2012 4:00 PM End: Tuesday 03/19/2012 4:45 PM  Provider/Observer:     Florencia Reasons, MSW, LCSW   Chief Complaint:      Chief Complaint  Patient presents with  . Anxiety  . Other    Mood Disturbances    Reason For Service:     The patient initially was referred for services by psychiatrist Dr. Lolly Mustache to improve coping and social skills. The patient has a history of severe mood swings, anxiety, and difficulty managing anger. She is resuming treatment today after a 5 month absence due to increased anxiety and stress. She reports several stressors including her father dying in 03-03-13patient recently learning that she has a blood clotting disorder and now is taking Coumadin, her former oncologist leaving her practice last month and patient having a new oncologist, and conflict with her daughters. Patient also reports recent conflict with her 45 year old grandson that resulted in patient asking him to leave her home. Patient is experiencing grief and loss issues not only related to the death of her father but also negative relationships with her daughters and grandson. She reports increased anxiety as well as increased desire to cut self but states that she has not done this due  to taking Coumadin.  Interventions Strategy:  Supportive therapy cognitive behavioral therapy  Participation Level:   Active  Participation Quality:  Monopolizing      Behavioral Observation:   rapid  pressured speech, anxious, fidgety, tearful  Current Psychosocial Factors: The patient's father who was in prison died December 10, 2011. Patient recently learned she has a blood clotting disorder. Patient has a different oncologist. Patient's grandson no longer resides with patient. Patient reports ongoing stress related to her relationship with her  daughters.   Content of Session:   Reviewing symptoms, processing feelings, practicing diaphragmatic breathing, exploring grounding techniques, discussing referral to psychiatrist  Current Status:   Patient reports increased agitation, irritability, anxiety, panic attacks, sleep difficulty, memory difficulty, poor concentration, and paranoia. She reports increased desire to cut self but denies engaging in any self injurious behaviors. She would not answer therapist's questioning regarding suicidal ideations but does contract for safety and states wanting help. Patient does not want to consider hospitalization at this time but does agree to see Dr. Lolly Mustache on  03/21/2052  for medication evaluation. Patient agrees to return for an appointment with this clinician next week. She also agrees to call this practice, call 911, or go to the emergency room should symptoms worsen. The patient denies any hallucinations.  Patient Progress:   Poor. Patient reports increased stressors have caused increased desire to cut self. However, patient has refrained from doing this due to to taking Coumadin. She has tried distracting activities including walking, cleaning,  watching TV, along with  diaphragmatic breathing.  However, these provide little to no relief. She reports she has not been taking any psychotropic medication due to her history of cancer and fear medication side effects. Patient reports being very lonely since her 17 year old grandson moved out of her home per her request after an argument in which her grandson refused to comply with one of patient's requests. Patient reports feeling disrespected by her grandson. He has been away from her home for 12 days and has had no contact with patient. She expresses hurt and disappointment as he as  well as his mother have stated they hate her per patient's report. Patient also shares with therapist the recording of a message from her daughter making negative statements about  patient and telling patient that she hated her. Therapist advises patient to avoid listening to the message. Patient reports feeling as though she has lost everyone. She does admit having some support from her sister as well as her boyfriend but defines her relationship with her boyfriend as mainly just having a roommate. Patient reports increased anxiety about leaving her home. She reports staying home most of the time alone as her boyfriend works. She also reports avoiding contact with her daughters due to her anger.  Target Goals:   1. Decrease/eliminate self-injurious behaviors and anger outbursts. 2. Decrease panic attacks and excessive worry.   Last Reviewed:   11/22/2010   Goals Addressed Today:         Decrease/eliminate self-injurious behaviors and anger outburst. Decrease panic attacks excessive worry.  Impression/Diagnosis:   The patient has a history of mood swings, anger, depression, insomnia, anxiety, and panic attacks along with irritability. She reports becoming angry easily and having a short fuse. The patient also experiences paranoia, hallucinations, and disturbing intrusive thoughts. Diagnosis of bipolar disorder NOS, rule out schizophrenia   Diagnosis:  Axis I:  1. Bipolar disorder             Axis II: Deferred

## 2012-03-21 ENCOUNTER — Ambulatory Visit (INDEPENDENT_AMBULATORY_CARE_PROVIDER_SITE_OTHER): Payer: Medicare Other | Admitting: Psychiatry

## 2012-03-21 ENCOUNTER — Encounter (HOSPITAL_COMMUNITY): Payer: Self-pay | Admitting: Psychiatry

## 2012-03-21 VITALS — Wt 209.0 lb

## 2012-03-21 DIAGNOSIS — F3189 Other bipolar disorder: Secondary | ICD-10-CM

## 2012-03-21 DIAGNOSIS — F200 Paranoid schizophrenia: Secondary | ICD-10-CM

## 2012-03-21 MED ORDER — HALOPERIDOL 2 MG PO TABS: 2.0000 mg | ORAL_TABLET | Freq: Every day | ORAL | Status: DC
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
## 2012-03-25 ENCOUNTER — Telehealth: Payer: Self-pay | Admitting: Oncology

## 2012-03-25 NOTE — Telephone Encounter (Signed)
Called and left message for pt to call back.

## 2012-03-26 ENCOUNTER — Telehealth: Payer: Self-pay | Admitting: Oncology

## 2012-03-26 ENCOUNTER — Encounter (HOSPITAL_COMMUNITY): Payer: Self-pay | Admitting: Psychiatry

## 2012-03-26 ENCOUNTER — Ambulatory Visit (INDEPENDENT_AMBULATORY_CARE_PROVIDER_SITE_OTHER): Payer: Medicare Other | Admitting: Psychiatry

## 2012-03-26 VITALS — BP 128/96 | HR 85 | Wt 211.0 lb

## 2012-03-26 DIAGNOSIS — F3189 Other bipolar disorder: Secondary | ICD-10-CM

## 2012-03-26 DIAGNOSIS — F200 Paranoid schizophrenia: Secondary | ICD-10-CM

## 2012-03-26 MED ORDER — HALOPERIDOL 2 MG PO TABS: ORAL_TABLET | ORAL | Status: DC

## 2012-03-26 MED ORDER — BENZTROPINE MESYLATE 0.5 MG PO TABS: 0.5000 mg | ORAL_TABLET | Freq: Every day | ORAL | Status: DC

## 2012-03-26 NOTE — Telephone Encounter (Signed)
Talked to the patient yesterday, she is aware of appt and location of appt, she is coming
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
## 2012-03-28 ENCOUNTER — Telehealth: Payer: Self-pay | Admitting: Oncology

## 2012-03-28 ENCOUNTER — Ambulatory Visit (INDEPENDENT_AMBULATORY_CARE_PROVIDER_SITE_OTHER): Payer: Medicare Other | Admitting: Psychiatry

## 2012-03-28 ENCOUNTER — Encounter (HOSPITAL_COMMUNITY): Payer: Self-pay | Admitting: Psychiatry

## 2012-03-28 DIAGNOSIS — F319 Bipolar disorder, unspecified: Secondary | ICD-10-CM

## 2012-03-28 NOTE — Progress Notes (Signed)
Patient:  Jessica Reilly   DOB: 09-Oct-1961  MR Number: [id number]  Location: [facility] Center:  [address] [place],  [place], [address]  Start: Thursday 03/28/2012 9:00 AM End: Thursday 03/28/2012 9:55 AM  Provider/Observer:     Florencia Reasons, MSW, LCSW   Chief Complaint:      Chief Complaint  Patient presents with  . Anxiety  . Other    Mood Disturbances    Reason For Service:     The patient initially was referred for services by psychiatrist Dr. Lolly Mustache to improve coping and social skills. The patient has a history of severe mood swings, anxiety, and difficulty managing anger. She is resuming treatment today after a 5 month absence due to increased anxiety and stress. She reports several stressors including her father dying in March 27, 2013patient recently learning that she has a blood clotting disorder and now is taking Coumadin, her former oncologist leaving her practice last month and patient having a new oncologist, and conflict with her daughters. Patient also reports recent conflict with her 9 year old grandson that resulted in patient asking him to leave her home. Patient is experiencing grief and loss issues not only related to the death of her father but also negative relationships with her daughters and grandson. She reports increased anxiety as well as increased desire to cut self but states that she has not done this due  to taking Coumadin. Patient is seen today for followup appointment.  Interventions Strategy:  Supportive therapy cognitive behavioral therapy  Participation Level:   Active  Participation Quality:  Monopolizing      Behavioral Observation:   rapid  pressured speech, talkative  Current Psychosocial Factors: The patient's grandson returned to reside with patient 2 days ago. The patient's father who was in prison died 2012/01/03. Patient recently learned she has a blood clotting disorder. Patient reports ongoing stress related to her  relationship with her daughters.   Content of Session:   Reviewing symptoms, processing feelings, reviewing relaxation techniques, identifying ways to set and maintain boundaries, identifying ways to improve indication skills   Current Status:   Patient reports decreased agitation, decreased paranoia, absence of hallucinations, and decreased irritability but continued anxiety, memory difficulty, sleep difficulty, and poor concentration. She denies any self-injurious behaviors, in a suicidal ideations, and any homicidal ideations.  Patient Progress:   Fair. Patient reports feeling better since last session. She has seen Dr. Lolly Mustache and has resumed taking psychotropic medication. She also is pleased that her grandson returned to reside in her home 2 days ago. She reports having a positive conversation with her grandson and being able to set and maintain boundaries in their relationship regarding her expectations. Patient also expresses increased acceptance of others boundaries and now is focusing more on areas within her control. Patient also has increased efforts to become involved in distracting activities to manage anxiety. Patient also reports decreased isolative behaviors and increased effort to stay outside of her room and go outside. Patient continues to have concerns regarding her health especially the use of Coumadin. However, she scheduled an appointment for 04/10/2012 to go to the Coumadin clinic and see a hematologist. She is hopeful about the appointment.   Target Goals:   1. Decrease/eliminate self-injurious behaviors and anger outbursts. 2. Decrease panic attacks and excessive worry.   Last Reviewed:   11/22/2010   Goals Addressed Today:         Decrease/eliminate self-injurious behaviors and anger outburst. Decrease panic attacks excessive worry.  Impression/Diagnosis:   The patient has a history of mood swings, anger, depression, insomnia, anxiety, and panic attacks along with  irritability. She reports becoming angry easily and having a short fuse. The patient also experiences paranoia, hallucinations, and disturbing intrusive thoughts. Diagnosis of bipolar disorder NOS, rule out schizophrenia   Diagnosis:  Axis I:  1. Bipolar disorder             Axis II: Deferred

## 2012-03-28 NOTE — Telephone Encounter (Signed)
Dx- Coag Factor V.

## 2012-03-28 NOTE — Patient Instructions (Signed)
Discussed orally 

## 2012-04-10 ENCOUNTER — Other Ambulatory Visit: Payer: Self-pay | Admitting: Pharmacist

## 2012-04-10 ENCOUNTER — Ambulatory Visit: Payer: Medicare Other

## 2012-04-10 ENCOUNTER — Telehealth: Payer: Self-pay | Admitting: Oncology

## 2012-04-10 ENCOUNTER — Other Ambulatory Visit (HOSPITAL_BASED_OUTPATIENT_CLINIC_OR_DEPARTMENT_OTHER): Payer: Medicare Other | Admitting: Lab

## 2012-04-10 ENCOUNTER — Encounter: Payer: Self-pay | Admitting: Oncology

## 2012-04-10 ENCOUNTER — Ambulatory Visit (HOSPITAL_BASED_OUTPATIENT_CLINIC_OR_DEPARTMENT_OTHER): Payer: Medicare Other | Admitting: Oncology

## 2012-04-10 VITALS — BP 136/94 | HR 75 | Temp 97.6°F | Ht 62.0 in | Wt 216.1 lb

## 2012-04-10 DIAGNOSIS — D689 Coagulation defect, unspecified: Secondary | ICD-10-CM

## 2012-04-10 DIAGNOSIS — C50919 Malignant neoplasm of unspecified site of unspecified female breast: Secondary | ICD-10-CM

## 2012-04-10 DIAGNOSIS — C50411 Malignant neoplasm of upper-outer quadrant of right female breast: Secondary | ICD-10-CM | POA: Insufficient documentation

## 2012-04-10 DIAGNOSIS — I82629 Acute embolism and thrombosis of deep veins of unspecified upper extremity: Secondary | ICD-10-CM

## 2012-04-10 DIAGNOSIS — D6859 Other primary thrombophilia: Secondary | ICD-10-CM

## 2012-04-10 DIAGNOSIS — D682 Hereditary deficiency of other clotting factors: Secondary | ICD-10-CM

## 2012-04-10 DIAGNOSIS — D6851 Activated protein C resistance: Secondary | ICD-10-CM

## 2012-04-10 DIAGNOSIS — Z170 Estrogen receptor positive status [ER+]: Secondary | ICD-10-CM

## 2012-04-10 DIAGNOSIS — Z86718 Personal history of other venous thrombosis and embolism: Secondary | ICD-10-CM

## 2012-04-10 DIAGNOSIS — I82A22 Chronic embolism and thrombosis of left axillary vein: Secondary | ICD-10-CM

## 2012-04-10 HISTORY — DX: Malignant neoplasm of unspecified site of unspecified female breast: C50.919

## 2012-04-10 HISTORY — DX: Coagulation defect, unspecified: D68.9

## 2012-04-10 HISTORY — DX: Acute embolism and thrombosis of deep veins of unspecified upper extremity: I82.629

## 2012-04-10 HISTORY — DX: Activated protein C resistance: D68.51

## 2012-04-10 LAB — CBC WITH DIFFERENTIAL/PLATELET
Eosinophils Absolute: 0.2 10*3/uL (ref 0.0–0.5)
HCT: 38.6 % (ref 34.8–46.6)
LYMPH%: 29.1 % (ref 14.0–49.7)
MCHC: 33.6 g/dL (ref 31.5–36.0)
MCV: 90.2 fL (ref 79.5–101.0)
MONO%: 4.6 % (ref 0.0–14.0)
NEUT#: 4.5 10*3/uL (ref 1.5–6.5)
NEUT%: 63.3 % (ref 38.4–76.8)
Platelets: 260 10*3/uL (ref 145–400)
RBC: 4.28 10*6/uL (ref 3.70–5.45)

## 2012-04-10 LAB — PROTIME-INR: Protime: 27.6 Seconds — ABNORMAL HIGH (ref 10.6–13.4)

## 2012-04-10 NOTE — Telephone Encounter (Signed)
gve the pt her nov 2013 appt calendar. Pt is aware that she will be contacted from the coumadin clinic

## 2012-04-10 NOTE — Progress Notes (Signed)
New Patient Hematology-Oncology Evaluation   Jessica Reilly [id number] Sep 10, 1961 51 y.o. 04/10/2012  CC: Dr. Rosezella Rumpf, Oncology, [facility] [place], [place]                   Reason for referral: The patient requests a second opinion about anticoagulation management in view of known heterozygous factor V Leiden gene mutation status   HPI:  Rather complicated history for this 51 year old woman who felt a lump in her right breast in September 2008. This measured 4.1 cm in maximum dimension on ultrasound. 4.8 cm on MRI of the breast. 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 During the course of the evaluation, likely on the ultrasound, she was told that there was evidence for previous right upper extremity blood clot. She was treated with neoadjuvant chemotherapy with 5 cycles of Cytoxan epirubicin and 5-fluorouracil by Dr. Glenford Peers in [place]. She had a stormy course with refractory nausea and vomiting from the chemotherapy. She wound up in the hospital at [facility] with dehydration and severe mucositis. She required a temporary feeding tube for nutritional support. When she was otherwise stable, she declined further chemotherapy and was started on Arimidex hormonal therapy which she has been on now for about 4 years. She had a right modified radical mastectomy and a simple left mastectomy and subsequent bilateral oophorectomy although she tells me she tested negative for the BRCA gene. At some time after the mastectomies in January 2009, she developed postoperative complications and was hospitalized at [facility]. It appears that during that admission she developed pain in her left proximal arm and was told that she had a deep venous thrombosis in the left basilic vein. She was put on Coumadin for about 6 months. She also received radiation to the right chest wall,  axilla, and supraclavicular area as part of her breast cancer treatment.  She elected to continue her oncology care at [facility] in [place] initially by Dr. Dollene Cleveland and currently by Dr. Neil Crouch. About 6 months ago she was considering elective knee surgery. Dr. Allie Dimmer checked her factor V Leiden status at that point and found that she was a heterozygote. She was started on Lovenox and Coumadin. She remains on Coumadin at this time. She has heard different opinions from different physicians about how serious the factor V Leiden gene mutation is and what the optimal duration of anticoagulation should be. She wanted to see a hematologist for further advice.   PMH: Past Medical History  Diagnosis Date  . HTN (hypertension)   . Acid reflux   . High cholesterol   . HX: breast cancer   . Gallstones   . Kidney stones   . Fibroid   . Heart murmur   . Osteoarthritis   . Factor V Leiden   . Coagulopathy 04/10/2012  . Factor V Leiden mutation 04/10/2012  . DVT of upper extremity (deep vein thrombosis) 04/10/2012  . Lobular carcinoma of breast, estrogen receptor positive, stage 2 Depression. Bell's palsy. Atypical chest pain Peptic ulcer disease Gastric polyps-gastroenterologist is Dr. Dionicia Abler injuries to  04/10/2012    Past Surgical History  Procedure Date  . Mastectomy bilateral January 2009   . Breast lumpectomy   . Ovaries removed  03/19/2008   . Tonsillectomy and adenoidectomy   . Lymph node biopsy Biopsy of a soft tissue nodule anterior left axilla 02/03/2011 negative for malignancy  Allergies: Allergies  Allergen Reactions  . Amoxicillin-Pot Clavulanate Other (See Comments)    unknown  . Cefuroxime Axetil Other (See Comments)    unknown  . Ceftin Rash    Medications: Arimidex 1 mg daily, Xanax 2 mg at bedtime when necessary, Nexium 40 mg twice a day, Toprol-XL 100 mg daily for atypical chest pain, Zocor 20 mg at bedtime, Coumadin 5 mg on Mondays and 6 mg  on the other days of the week.   Social History:    she has never smoked. she does not drink alcohol or use illicit drugs.  Family History: A 46 year old brother has some form of leukemia details not known to the patient. A 44 year old brother currently being evaluated for what sounds like ITP with a low platelet count. A 25 year old sisters alive and well. Father died of possible stroke at age 48. Mother died of complications of obstructive airway disease at age 24. A maternal uncle had lymphoma, another maternal uncle with another kind of cancer unknown to the patient, and maternal grandmother had ovarian cancer which resulted in her demise. Family History  Problem Relation Age of Onset  . Alcohol abuse Mother   . COPD Mother   . Alcohol abuse Father   . Rectal cancer Father   . Heart disease Father   . Pneumonia Father   . OCD Daughter   . Bipolar disorder Daughter   . Hypertension Daughter   . Schizophrenia Daughter   . Bipolar disorder Daughter   . OCD Daughter   . Hypertension Daughter   . COPD Daughter   . ADD / ADHD Grandchild   . Anxiety disorder Grandchild   . Depression Grandchild   . Healthy Sister   . Schizophrenia Cousin     Review of Systems: Constitutional symptoms: No constitutional symptoms HEENT: No sore throat Respiratory: No cough or dyspnea Cardiovascular:  Currently no ischemic type chest pain or palpitations Gastrointestinal ROS: No change in bowel habit Genito-Urinary ROS: No vaginal bleeding Hematological and Lymphatic: Musculoskeletal: No bone pain Neurologic: No headache or change in vision Dermatologic: No rash or ecchymoses Remaining ROS negative.  Physical Exam: Blood pressure 136/94, pulse 75, temperature 97.6 F (36.4 C), temperature source Oral, height 5\' 2"  (1.575 m), weight 216 lb 1.6 oz (98.022 kg). Wt Readings from Last 3 Encounters:  04/10/12 216 lb 1.6 oz (98.022 kg)  03/26/12 211 lb (95.709 kg)  03/21/12 209 lb (94.802 kg)     General appearance: Pleasant, talkative, well-nourished Caucasian woman Head: Normal Neck: Full range of motion Lymph nodes: Small 1 cm nodule left anterior axillary wall with previous negative biopsy Breasts: Bilateral mastectomies no chest wall lesions Lungs: Clear to auscultation resonant to percussion Heart: Regular rhythm Abdominal: Soft nontender no mass no organomegaly GU: Not examined Extremities: No calf tenderness, no edema Neurologic: Mental status intact, cranial nerves intact, pupils equal round reactive to light, optic disc sharp, motor strength 5 over 5, reflexes 1+ symmetric, coordination normal. Carotids 1+ no bruits. Skin: No rash or ecchymosis    Lab Results: Lab Results  Component Value Date   WBC 7.1 04/10/2012   HGB 13.0 04/10/2012   HCT 38.6 04/10/2012   MCV 90.2 04/10/2012   PLT 260 04/10/2012     Chemistry      Component Value Date/Time   NA 139 08/17/2010 1153   K 3.8 08/17/2010 1153   CL 106 08/17/2010 1153   CO2 22 08/17/2010 1153   BUN 12 08/17/2010 1153   CREATININE 0.98  08/17/2010 1153      Component Value Date/Time   CALCIUM 9.3 08/17/2010 1153   ALKPHOS 93 08/17/2010 1153   AST 21 08/17/2010 1153   ALT 24 08/17/2010 1153   BILITOT 0.7 08/17/2010 1153        Impression and Plan: #1. Factor. V Leiden heterozygote status Subclinical right axillary vein? thrombosis incidentally noted at time of evaluation of a large primary tumor in her right breast. Subsequent left basilic vein thrombosis following bilateral mastectomies. Ongoing hormonal therapy with Arimidex.  I reviewed with the patient the fact that factor V Leiden heterozygote status by itself is a minor risk factor for thrombosis which usually only expresses itself with other coexisting risk factors such as surgery, infection, or concomitant use of estrogen-like drugs. There was an approximate 1% incidence of venous thrombosis in the NSABP-P1 trial with tamoxifen versus placebo. This was  primarily stroke and pulmonary emboli in women over 50. There was an approximate 0.05% incidence of thrombosis in the ATAC trial which looked at Arimidex versus tamoxifen versus the combination. While we know that the risk of thrombosis on an aromatase inhibitor is less than the risk of tamoxifen, we really don't have any data about the combined the use of an aromatase inhibitor in a cohort of patients who also test positive for the factor V Leiden gene mutation. The potentiation of thrombosis, up to 30 fold, with concomitant estrogen-like drugs and factor V Leiden may be related to the fact that tamoxifen can still partially stimulate the estrogen receptor.  Recommendation: I think it would be reasonable to continue Coumadin until she completes her final year of planned Arimidex in September of 2014. There is absolutely no reason to keep her on chronic full dose anticoagulation after that. She is advised that she should have thromboprophylaxis around any orthopedic surgery for the standard amount of time which for knee surgery would be about 4 weeks. She would like to have her Coumadin monitored through my office Coumadin clinic and we are happy to accommodate her in this regard.  #2. "Stage I" ER, PR, positive, HER-2 negative, grade 3 invasive ductal carcinoma right breast treated as outlined above. Although this has been documented as a stage I tumor based on residual invasive cancer 1.7 cm following neoadjuvant chemotherapy, this was clearly a T2 lesion and almost a T3 lesion with maximum dimension up to 4.8 cm on pretreatment breast MRI. Whether or not to continue Arimidex past 5 years is the subject of an ongoing clinical trial. Once she completes the fifth year of her Arimidex, this data may be available to guide future treatment decisions.      Levert Feinstein, MD 04/10/2012, 6:34 PM

## 2012-04-10 NOTE — Telephone Encounter (Signed)
gve the pt her coumadin clinic appt. °

## 2012-04-12 LAB — CARDIOLIPIN ANTIBODIES, IGG, IGM, IGA
Anticardiolipin IgA: 1 APL U/mL (ref <22)
Anticardiolipin IgG: 3 GPL U/mL (ref <23)
Anticardiolipin IgM: 1 MPL U/mL (ref <11)

## 2012-04-12 LAB — LUPUS ANTICOAGULANT PANEL
Lupus Anticoagulant: NOT DETECTED
PTTLA Confirmation: 1.9 secs (ref <8.0)

## 2012-04-12 LAB — BETA-2 GLYCOPROTEIN ANTIBODIES
Beta-2-Glycoprotein I IgA: 4 A Units (ref <20)
Beta-2-Glycoprotein I IgM: 2 M Units (ref <20)

## 2012-04-16 ENCOUNTER — Ambulatory Visit (HOSPITAL_COMMUNITY): Payer: Self-pay | Admitting: Psychiatry

## 2012-04-17 ENCOUNTER — Telehealth: Payer: Self-pay | Admitting: *Deleted

## 2012-04-17 NOTE — Telephone Encounter (Signed)
Copy of Beta 2 Glycoproteins, antibodies, & lupus Anticoagulant results faxed to Dr. Nancie Neas, referring MD per pt at Dr. Patsy Lager request @ [phone].

## 2012-04-18 ENCOUNTER — Other Ambulatory Visit (HOSPITAL_BASED_OUTPATIENT_CLINIC_OR_DEPARTMENT_OTHER): Payer: Medicare Other | Admitting: Lab

## 2012-04-18 ENCOUNTER — Ambulatory Visit (HOSPITAL_BASED_OUTPATIENT_CLINIC_OR_DEPARTMENT_OTHER): Payer: Medicare Other | Admitting: Pharmacist

## 2012-04-18 ENCOUNTER — Telehealth: Payer: Self-pay | Admitting: Pharmacist

## 2012-04-18 DIAGNOSIS — D6859 Other primary thrombophilia: Secondary | ICD-10-CM

## 2012-04-18 DIAGNOSIS — D682 Hereditary deficiency of other clotting factors: Secondary | ICD-10-CM

## 2012-04-18 LAB — PROTIME-INR: Protime: 28.8 Seconds — ABNORMAL HIGH (ref 10.6–13.4)

## 2012-04-18 NOTE — Progress Notes (Signed)
Pt with a history of bilateral UE DVTs and Factor V Leiden deficiency. seen for first visit to the Coumadin Clinic accompanied with her friend.  Dr. Cyndie Chime plans to continue anticoagulation for as long as she is on Arimidex for her h/o breast cancer.  A full discussion of the nature of anticoagulants has been carried out.  A benefit risk analysis has been presented to the patient, so that they understand the justification for choosing anticoagulation at this time. The need for frequent and regular monitoring, precise dosage adjustment and compliance is stressed.  Side effects of potential bleeding are discussed.  The patient should avoid any OTC items containing aspirin or ibuprofen, and should avoid great swings in general diet.  Avoid alcohol consumption - which patient states she does not drink or smoke.  Call if any signs of abnormal bleeding.  Pt and her friend had numerous questions about Vitamin K containing foods, issues with drug-drug interactions, and other anticoagulation concerns surrounding intraarticular injections and colonoscopy procedures.  Pt shows a high degree of anxiety regarding future clots, and is very compliant with her Coumadin even if compliance with her antipsychotic medications is questionable.  She communicated understanding of all that was discussed.  Several handouts were provided today including a table describing foods with low, medium, and high Vitamin K content and Coumadin drug information.  INR therapeutic today (2.4) on previous dose of 6mg  daily except 5mg  on Mondays.  No complaints of bleeding or bruising.  Will continue current dose, and recheck INR in two weeks.

## 2012-04-22 ENCOUNTER — Ambulatory Visit (HOSPITAL_COMMUNITY): Payer: Self-pay | Admitting: Psychiatry

## 2012-05-02 ENCOUNTER — Ambulatory Visit (HOSPITAL_BASED_OUTPATIENT_CLINIC_OR_DEPARTMENT_OTHER): Payer: Medicare Other | Admitting: Pharmacist

## 2012-05-02 ENCOUNTER — Other Ambulatory Visit (HOSPITAL_BASED_OUTPATIENT_CLINIC_OR_DEPARTMENT_OTHER): Payer: Medicare Other | Admitting: Lab

## 2012-05-02 DIAGNOSIS — D682 Hereditary deficiency of other clotting factors: Secondary | ICD-10-CM

## 2012-05-02 LAB — PROTIME-INR
INR: 2.5 (ref 2.00–3.50)
Protime: 30 Seconds — ABNORMAL HIGH (ref 10.6–13.4)

## 2012-05-02 LAB — POCT INR: INR: 2.5

## 2012-05-02 NOTE — Progress Notes (Signed)
INR = 2.5 on 6 mg/day; 5 mg Mon C/O upper R arm pain & swelling in both hands.  She had LN removed from her R axilla. No missed doses of Coumadin. INR at goal.  No change to dose. Return in 1 month or sooner if pain in R arm worsens. Marily Lente, Pharm.D.

## 2012-05-30 ENCOUNTER — Other Ambulatory Visit (HOSPITAL_BASED_OUTPATIENT_CLINIC_OR_DEPARTMENT_OTHER): Payer: Medicare Other | Admitting: Lab

## 2012-05-30 ENCOUNTER — Ambulatory Visit (HOSPITAL_BASED_OUTPATIENT_CLINIC_OR_DEPARTMENT_OTHER): Payer: Medicare Other | Admitting: Pharmacist

## 2012-05-30 DIAGNOSIS — D682 Hereditary deficiency of other clotting factors: Secondary | ICD-10-CM

## 2012-05-30 LAB — PROTIME-INR

## 2012-05-30 LAB — POCT INR: INR: 2

## 2012-05-30 NOTE — Patient Instructions (Signed)
Pt is seen today with no complaints or changes.  She is taking 6mg  daily with 5 mg on Mondays.  She did have a fall down about 4 steps when cats were caught up in her legs.  No complaints other than being sore. She has 2 refills on tablets

## 2012-05-30 NOTE — Progress Notes (Signed)
Pt is seen today with no complaints or changes.  She is taking 6mg  daily with 5 mg on Mondays.  She did have a fall down about 4 steps when cats were caught up in her legs.  No complaints other than being sore. She has 2 refills on tablets.  She will RTC on 07/01/12 at 11:30.

## 2012-06-08 ENCOUNTER — Telehealth: Payer: Self-pay | Admitting: Oncology

## 2012-06-08 NOTE — Telephone Encounter (Signed)
Called pt and left message regarding appt r/s from 11/8 to 11/15 MD visit only, per MD PAL

## 2012-06-25 ENCOUNTER — Encounter (INDEPENDENT_AMBULATORY_CARE_PROVIDER_SITE_OTHER): Payer: Self-pay | Admitting: Internal Medicine

## 2012-06-25 ENCOUNTER — Ambulatory Visit (INDEPENDENT_AMBULATORY_CARE_PROVIDER_SITE_OTHER): Payer: Medicare Other | Admitting: Internal Medicine

## 2012-06-25 VITALS — BP 120/80 | HR 78 | Temp 97.4°F | Resp 18 | Ht 62.0 in | Wt 213.4 lb

## 2012-06-25 DIAGNOSIS — K589 Irritable bowel syndrome without diarrhea: Secondary | ICD-10-CM

## 2012-06-25 DIAGNOSIS — Z86010 Personal history of colonic polyps: Secondary | ICD-10-CM

## 2012-06-25 MED ORDER — DOCUSATE SODIUM 100 MG PO CAPS: 200.0000 mg | ORAL_CAPSULE | Freq: Every day | ORAL | Status: AC

## 2012-06-25 NOTE — Patient Instructions (Signed)
Colonoscopy to be scheduled in January 2014

## 2012-06-25 NOTE — Progress Notes (Signed)
Presenting complaint;  Followup for IBS and constipation. History of colonic adenomas.  Subjective:  Jessica Reilly is 51 year old Caucasian female who is here for scheduled visit accompanied by her daughter. She was last seen in March 2013 for abdominal pain and constipation and felt to have IBS. She was advised to be on a high fiber diet and stool softener. She was also given prescription for hyoscyamine sublingual but she did not fill this prescription. She feels much better. Her bowels generally move every other day. She still does not have sense of complete evacuation. She denies melena rectal bleeding nausea are vomiting. She has occasional pain at [facility] which is mild. She sees Dr. Cyndie Chime and her INR has been steady. She she is quite anxious to have her colonoscopy because of history of polyps and family history of various cancers but wants to wait until after the Christmas holidays before her next colonoscopy. She states she does not have a good appetite but doesn't understand why she keeps gaining weight. She remains with bilateral knee pain. She has osteoarthrosis.  Current Medications: Current Outpatient Prescriptions  Medication Sig Dispense Refill  . alprazolam (XANAX) 2 MG tablet Take 2 mg by mouth at bedtime as needed. For anxiety      . anastrozole (ARIMIDEX) 1 MG tablet Take 1 mg by mouth daily.        . Cholecalciferol (VITAMIN D) 1000 UNITS capsule Take 1,000 Units by mouth daily.      [name] Calcium (STOOL SOFTENER PO) Take 1 capsule by mouth at bedtime.       Marland Kitchen esomeprazole (NEXIUM) 40 MG packet Take 40 mg by mouth 2 (two) times daily. Patient takes twice a day      . fluticasone (FLONASE) 50 MCG/ACT nasal spray Place 2 sprays into the nose daily. Takes 2 sprays into each nostril at bedtime      . levocetirizine (XYZAL) 5 MG tablet Take 5 mg by mouth every evening.      . lidocaine (LIDODERM) 5 % Place 1 patch onto the skin daily. Remove & Discard patch within 12 hours or as  directed by MD (only uses prn)      . metoprolol (TOPROL-XL) 100 MG 24 hr tablet Take 100 mg by mouth daily.        . simvastatin (ZOCOR) 20 MG tablet Take 20 mg by mouth at bedtime.        Marland Kitchen warfarin (COUMADIN) 2 MG tablet Take 8 mg by mouth daily. Patient takes 5 mg on mondays & 6 mg other days      . zolpidem (AMBIEN) 10 MG tablet Take 10 mg by mouth as needed.       . benztropine (COGENTIN) 0.5 MG tablet Take 1 tablet (0.5 mg total) by mouth daily.  30 tablet  0  . haloperidol (HALDOL) 2 MG tablet Take 2 tab at bed time  60 tablet  0     Objective: Blood pressure 120/80, pulse 78, temperature 97.4 F (36.3 C), temperature source Oral, resp. rate 18, height 5\' 2"  (1.575 m), weight 213 lb 6.4 oz (96.798 kg). Patient is alert and appears to be comfortable and able to move from chair to examination table without any difficulty. Conjunctiva is pink. Sclera is nonicteric Oropharyngeal mucosa is normal. No neck masses or thyromegaly noted. Cardiac exam with regular rhythm normal S1 and S2. No murmur or gallop noted. Lungs are clear to auscultation. Abdomen is full. Bowel sounds are normal. Soft abdomen with minimal tenderness  at [facility] on deep palpation. No organomegaly or masses. No LE edema or clubbing noted.   Assessment:  #1. Irritable bowel syndrome with constipation. She appears to be doing much better still having for evacuation. She needs to increase intake of fiber rich foods and could double up on her Colace. #2. History of colonic adenomas. Last colonoscopy was in August 2009.  Plan:  Increase Colace to 2 tablets or capsules daily. Increase intake of fiber rich foods. Surveillance colonoscopy in January 2014. Patient will need to be off warfarin for 4-5 days; will get clearance from Dr. Cyndie Chime before warfarin stopped temporarily.  Office visit in one year.

## 2012-06-28 ENCOUNTER — Encounter: Payer: Self-pay | Admitting: Oncology

## 2012-06-28 NOTE — Progress Notes (Signed)
Pt called stating she received bill from [facility] when she has 100% discount.  They requested copy of approval letter.  I faxed on cover sheet that pt does have 100% discount effective 01/21/11 - 01/22/13.

## 2012-07-01 ENCOUNTER — Other Ambulatory Visit (HOSPITAL_BASED_OUTPATIENT_CLINIC_OR_DEPARTMENT_OTHER): Payer: Medicare Other | Admitting: Lab

## 2012-07-01 ENCOUNTER — Ambulatory Visit (HOSPITAL_BASED_OUTPATIENT_CLINIC_OR_DEPARTMENT_OTHER): Payer: Medicare Other | Admitting: Pharmacist

## 2012-07-01 DIAGNOSIS — D682 Hereditary deficiency of other clotting factors: Secondary | ICD-10-CM

## 2012-07-01 LAB — PROTIME-INR: INR: 2.6 (ref 2.00–3.50)

## 2012-07-01 LAB — POCT INR: INR: 2.6

## 2012-07-01 NOTE — Progress Notes (Signed)
Continue taking 6mg  daily except 5mg  on Monday.  Recheck INR 07/31/12 at 9:00 am for lab; 9:15 am for Coumadin clinic. Colonoscopy scheduled for January 2014.

## 2012-07-01 NOTE — Patient Instructions (Addendum)
Continue taking 6mg  daily except 5mg  on Monday.  Recheck INR 07/31/12 at 9:00 am for lab; 9:15 am for Coumadin clinic.

## 2012-07-05 ENCOUNTER — Other Ambulatory Visit: Payer: Self-pay | Admitting: Lab

## 2012-07-05 ENCOUNTER — Ambulatory Visit: Payer: Self-pay

## 2012-07-15 ENCOUNTER — Other Ambulatory Visit: Payer: Self-pay | Admitting: *Deleted

## 2012-07-15 DIAGNOSIS — I8290 Acute embolism and thrombosis of unspecified vein: Secondary | ICD-10-CM

## 2012-07-15 MED ORDER — WARFARIN SODIUM 5 MG PO TABS: ORAL_TABLET | ORAL | Status: DC

## 2012-07-16 ENCOUNTER — Other Ambulatory Visit: Payer: Self-pay | Admitting: Pharmacist

## 2012-07-16 NOTE — Telephone Encounter (Signed)
The following Rx was called in to her pharmacy:  Coumadin 1mg  tablets 1 PO daily as directed #30 3RF, MD: [place].

## 2012-07-22 ENCOUNTER — Ambulatory Visit (INDEPENDENT_AMBULATORY_CARE_PROVIDER_SITE_OTHER): Payer: Medicare Other | Admitting: Psychiatry

## 2012-07-22 DIAGNOSIS — F319 Bipolar disorder, unspecified: Secondary | ICD-10-CM

## 2012-07-22 NOTE — Patient Instructions (Signed)
Discussed orally 

## 2012-07-22 NOTE — Progress Notes (Signed)
Patient:  Jessica Reilly   DOB: [birth year]  MR Number: [id number]  Location: [facility]:  [address] [place],  [place], [address]  Start: Monday 07/22/2012 9:10 AM End: Monday 07/22/2012 9:55 AM  Provider/Observer:     Florencia Reasons, MSW, LCSW   Chief Complaint:      Chief Complaint  Patient presents with  . Depression  . Anxiety    Reason For Service:     The patient initially was referred for services by psychiatrist Dr. Lolly Mustache to improve coping and social skills. The patient has a history of severe mood swings, anxiety, and difficulty managing anger. She is resuming treatment today after a 4 month absence due to increased anxiety and stress. She reports several stressors including having a blood clotting disorder necessitating patient taking Coumadin,  conflict with her boyfriend, ongoing negative relationships with her daughters, and discord with her grandson. She reports increased anxiety as well as increased desire to cut self but states that she has not done this due  to taking Coumadin.   Interventions Strategy:  Supportive therapy cognitive behavioral therapy  Participation Level:   Active  Participation Quality:  Monopolizing      Behavioral Observation:   rapid  pressured speech, talkative  Current Psychosocial Factors: The patient reports having an argument with her boyfriend this past weekend.  Content of Session:   Reviewing symptoms, processing feelings, discussing importance of medication compliance, exploring coping techniques   Current Status:   Patient reports increased agitation, hallucinations ( static, voices, no command hallucinations), increased irritability along with continued anxiety, memory difficulty, sleep difficulty, and poor concentration. She denies any self-injurious behaviors. She admits having fleeting suicidal and homicidal ideations this past weekend during an argument with her boyfriend. Patient denies any current suicidal and  homicidal ideations.  Patient Progress:   Poor. Patient reports increased stress, irritability, and anxiety. She states having more difficulty regarding interaction with her family and others. Patient states just wanting to stay home. She expresses frustration with her family as she says no one listens to her and reports feeling taken for granted. Patient admits she has not been taking her psychotropic medication as her family said that she seemed to be mean when she took the medication. However, patient has made an appointment to see Dr. Lolly Mustache for medication management. Therapist works with patient to discuss the importance of medication compliance. Patient has been trying to set and maintain boundaries with her family. Therapist works with patient to reinforce her efforts. Therapist also works with patient to explore coping techniques. Patient expresses frustration about her physical and mental illness. She states feeling as though she does not have control over anything in her life. Therapist works with patient to identify areas where she does have control.  Target Goals:   1. Decrease/eliminate self-injurious behaviors and anger outbursts. 2. Decrease panic attacks and excessive worry.   Last Reviewed:   11/22/2010   Goals Addressed Today:         Decrease/eliminate self-injurious behaviors and anger outburst. Decrease panic attacks excessive worry.  Impression/Diagnosis:   The patient has a history of mood swings, anger, depression, insomnia, anxiety, and panic attacks along with irritability. She reports becoming angry easily and having a short fuse. The patient also experiences paranoia, hallucinations, and disturbing intrusive thoughts. Diagnosis of bipolar disorder NOS, rule out schizophrenia   Diagnosis:  Axis I:  No diagnosis found.          Axis II: Deferred

## 2012-07-23 ENCOUNTER — Other Ambulatory Visit: Payer: Self-pay | Admitting: Adult Health

## 2012-07-23 ENCOUNTER — Ambulatory Visit (HOSPITAL_COMMUNITY)
Admission: RE | Admit: 2012-07-23 | Discharge: 2012-07-23 | Disposition: A | Discharge status: Home / Self Care | Payer: Medicare Other | Source: Ambulatory Visit | Attending: Adult Health | Admitting: Adult Health

## 2012-07-23 ENCOUNTER — Encounter (HOSPITAL_COMMUNITY): Payer: Self-pay

## 2012-07-23 DIAGNOSIS — Q619 Cystic kidney disease, unspecified: Secondary | ICD-10-CM | POA: Insufficient documentation

## 2012-07-23 DIAGNOSIS — R109 Unspecified abdominal pain: Secondary | ICD-10-CM

## 2012-07-23 DIAGNOSIS — R1032 Left lower quadrant pain: Secondary | ICD-10-CM | POA: Insufficient documentation

## 2012-07-23 DIAGNOSIS — N200 Calculus of kidney: Secondary | ICD-10-CM | POA: Insufficient documentation

## 2012-07-23 DIAGNOSIS — K8020 Calculus of gallbladder without cholecystitis without obstruction: Secondary | ICD-10-CM | POA: Insufficient documentation

## 2012-07-23 DIAGNOSIS — K7689 Other specified diseases of liver: Secondary | ICD-10-CM | POA: Insufficient documentation

## 2012-07-30 ENCOUNTER — Ambulatory Visit (INDEPENDENT_AMBULATORY_CARE_PROVIDER_SITE_OTHER): Payer: Medicare Other | Admitting: Psychiatry

## 2012-07-30 ENCOUNTER — Encounter (HOSPITAL_COMMUNITY): Payer: Self-pay | Admitting: Psychiatry

## 2012-07-30 DIAGNOSIS — F319 Bipolar disorder, unspecified: Secondary | ICD-10-CM

## 2012-07-30 DIAGNOSIS — F200 Paranoid schizophrenia: Secondary | ICD-10-CM

## 2012-07-30 MED ORDER — BENZTROPINE MESYLATE 0.5 MG PO TABS: 0.5000 mg | ORAL_TABLET | Freq: Every day | ORAL | Status: DC

## 2012-07-30 MED ORDER — HALOPERIDOL 2 MG PO TABS: ORAL_TABLET | ORAL | Status: DC
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
## 2012-07-31 ENCOUNTER — Ambulatory Visit (HOSPITAL_BASED_OUTPATIENT_CLINIC_OR_DEPARTMENT_OTHER): Payer: Medicare Other | Admitting: Pharmacist

## 2012-07-31 ENCOUNTER — Other Ambulatory Visit (HOSPITAL_BASED_OUTPATIENT_CLINIC_OR_DEPARTMENT_OTHER): Payer: Medicare Other

## 2012-07-31 DIAGNOSIS — D682 Hereditary deficiency of other clotting factors: Secondary | ICD-10-CM

## 2012-07-31 DIAGNOSIS — D6859 Other primary thrombophilia: Secondary | ICD-10-CM

## 2012-07-31 LAB — PROTIME-INR: Protime: 25.2 Seconds — ABNORMAL HIGH (ref 10.6–13.4)

## 2012-07-31 NOTE — Progress Notes (Signed)
INR = 2.1 on 6 mg/day; 5 mg Mon. Doing well except had L flank/back pain recently.  Thinks may have kidney stone.  Had CT scan. No missed doses of Coumadin. No change to Coumadin dose since INR is fine. Return w/ next MD appt (3 weeks). Marily Lente, Pharm.D.

## 2012-08-12 ENCOUNTER — Ambulatory Visit (INDEPENDENT_AMBULATORY_CARE_PROVIDER_SITE_OTHER): Payer: Medicare Other | Admitting: Psychiatry

## 2012-08-12 DIAGNOSIS — F319 Bipolar disorder, unspecified: Secondary | ICD-10-CM

## 2012-08-12 NOTE — Patient Instructions (Signed)
Discussed orally 

## 2012-08-12 NOTE — Progress Notes (Signed)
Patient:  Jessica Reilly   DOB: Mar 13, 1961  MR Number: [id number]  Location: [facility] Center:  [address] [place],  [place], [address]  Start: Monday 08/12/2012 9:10 AM End: Monday 08/12/2012 10:05 AM  Provider/Observer:     Florencia Reasons, MSW, LCSW   Chief Complaint:      Chief Complaint  Patient presents with  . Depression  . Anxiety    Reason For Service:     The patient initially was referred for services by psychiatrist Dr. Lolly Mustache to improve coping and social skills. The patient has a history of severe mood swings, anxiety, and difficulty managing anger along with psychotic symptoms. Patient is seen today for a follow up appointment.  Interventions Strategy:  Supportive therapy cognitive behavioral therapy  Participation Level:   Active  Participation Quality:  Monopolizing      Behavioral Observation:   rapid  pressured speech, talkative  Current Psychosocial Factors: The patient reports continued health issues and increased difficulty walking.  Content of Session:   Reviewing symptoms, processing feelings, discussing importance of medication compliance, identifying relaxation techniques   Current Status:   Patient reports minimal change in symptoms and continues to experience, anxiety, memory difficulty, sleep difficulty, and poor concentration. However, she reports absence of at auditory hallucinations but reports seeing shadows and lights out of the corner of her eye. She denies any cutting but says she sometimes bangs her hand in a drawer.. She denies having any suicidal and homicidal ideations since last session.  Patient Progress:   Fair. Patient reports seeing Dr. Lolly Mustache and beginning to take the medication. However, she admits poor compliance as she does not take medication some days due to to fear of being too sedated. Therapist  encourages patient to take medication as prescribed and discusses the importance of medication compliance. Patient is experiencing  increased grief and loss issues as the upcoming holidays approach. This is the first year she will faced the holidays without her father who died earlier this year. Patient also is experiencing loss related to her reduced physical functioning. She is experiencing increased leg and knee pain as she needs knee replacements. However, patient fears she will die if she has surgery. Patient also expresses anger regarding the various negative situations she has faced in life. Therapist works with patient to process her feelings as well as discuss patient's survival efforts and skills.   Target Goals:   1. Decrease/eliminate self-injurious behaviors and anger outbursts. 2. Decrease panic attacks and excessive worry.   Last Reviewed:   11/22/2010   Goals Addressed Today:         Goal 2  Impression/Diagnosis:   The patient has a history of mood swings, anger, depression, insomnia, anxiety, and panic attacks along with irritability. She reports becoming angry easily and having a short fuse. The patient also experiences paranoia, hallucinations, and disturbing intrusive thoughts. Diagnosis of bipolar disorder NOS, rule out schizophrenia   Diagnosis:  Axis I:  1. Bipolar disorder             Axis II: Deferred

## 2012-08-16 ENCOUNTER — Ambulatory Visit: Payer: Self-pay | Admitting: Oncology

## 2012-08-23 ENCOUNTER — Ambulatory Visit (HOSPITAL_BASED_OUTPATIENT_CLINIC_OR_DEPARTMENT_OTHER): Payer: Medicare Other | Admitting: Oncology

## 2012-08-23 ENCOUNTER — Ambulatory Visit: Payer: Medicare Other | Admitting: Pharmacist

## 2012-08-23 ENCOUNTER — Telehealth: Payer: Self-pay | Admitting: Oncology

## 2012-08-23 ENCOUNTER — Other Ambulatory Visit (HOSPITAL_BASED_OUTPATIENT_CLINIC_OR_DEPARTMENT_OTHER): Payer: Medicare Other | Admitting: Lab

## 2012-08-23 VITALS — BP 114/81 | HR 65 | Temp 98.1°F | Resp 20 | Ht 62.0 in | Wt 213.6 lb

## 2012-08-23 DIAGNOSIS — C50919 Malignant neoplasm of unspecified site of unspecified female breast: Secondary | ICD-10-CM

## 2012-08-23 DIAGNOSIS — D6859 Other primary thrombophilia: Secondary | ICD-10-CM

## 2012-08-23 DIAGNOSIS — Z170 Estrogen receptor positive status [ER+]: Secondary | ICD-10-CM

## 2012-08-23 DIAGNOSIS — D682 Hereditary deficiency of other clotting factors: Secondary | ICD-10-CM

## 2012-08-23 DIAGNOSIS — I82409 Acute embolism and thrombosis of unspecified deep veins of unspecified lower extremity: Secondary | ICD-10-CM

## 2012-08-23 LAB — PROTIME-INR: Protime: 20.4 Seconds — ABNORMAL HIGH (ref 10.6–13.4)

## 2012-08-23 NOTE — Telephone Encounter (Signed)
Gave patient appt for December 2013 and January 2014

## 2012-08-23 NOTE — Progress Notes (Signed)
INR = 1.7 on 6 mg/day; 5 mg Mon. Stressed at unexpected passing of her brother this week. No missed doses of Coumadin. She is inquiring if Biotin will interact w/ her Coumadin.  She is thinking of taking Biotin for hair & nail growth.  After [employer] database search, there is no effect on INR but pts may be at risk for bleeding.  Pt is afraid to start Biotin as a result. She is now on Fish Oil for cholesterol control. Pt is afraid to come off Coumadin after her Arimidex is complete.  She is meeting w/ Dr. Cyndie Chime today & will discuss. Today only pt will take 7 mg Coumadin then back to usual dose that has kept her INR at goal. INR repeat in 1 month. Marily Lente, Pharm.D.

## 2012-08-23 NOTE — Patient Instructions (Signed)
Repeat coumadin level on 12/2 Dr visit and lab 6 months on 02/19/13 @ 4:30

## 2012-08-25 NOTE — Progress Notes (Signed)
Hematology and Oncology Follow Up Visit  Jessica Reilly [id number] 1961/03/10 51 y.o. 08/25/2012 9:42 AM   Principle Diagnosis: Encounter Diagnoses  Name Primary?  . Lobular carcinoma of breast, estrogen receptor positive, stage 2 Yes  . Factor V Leiden mutation   . DVT of upper extremity (deep vein thrombosis)   . Coagulopathy      Interim History:   Followup visit for this 51 year old woman I saw  for initial office consultation back in July of this year for advice on long-term anticoagulation. Please see my office note for full details. Briefly, she has a history of a stage II, T2 N0 ER/PR positive HER-2 negative cancer of the right breast status post right modified radical mastectomy and elective simple left mastectomy with subsequent bilateral oophorectomy. She received postoperative radiation to the right chest wall, axilla, and supraclavicular area. She sustained a left upper extremity DVT following left breast surgery. She tested negative for the BRCA1 and BRCA 2 genes by her history. She received CEF chemotherapy for 5 cycles. She had very poor tolerance to the treatments. She was then put on hormonal therapy with Arimidex. Along the way, during an evaluation at [facility], she was found to be a heterozygote for the factor V Leiden gene mutation. She has been kept on therapeutic Coumadin. Although the risk of thrombosis on an aromatase inhibitor is less then on tamoxifen, given her history, I felt it was reasonable to continue the Coumadin until she completes a planned 5 year course of Arimidex in September 2014. She has elected to have her Coumadin monitored through our office.  She's had no interim medical problems. She denies any headache, change in vision, bone pain, vaginal bleeding, change in bowel habit.    Medications: reviewed  Allergies:  Allergies  Allergen Reactions  . Amoxicillin-Pot Clavulanate Other (See Comments)    unknown  . Cefuroxime Axetil  Other (See Comments)    unknown  . Ceftin Rash    Review of Systems: Constitutional:   No constitutional symptoms Respiratory: No cough or dyspnea. Cardiovascular:  No chest pain or palpitations Gastrointestinal: See above Genito-Urinary: See above Musculoskeletal: See above Neurologic: See above Skin: No rash or ecchymosis Remaining ROS negative.  Physical Exam: Blood pressure 114/81, pulse 65, temperature 98.1 F (36.7 C), temperature source Oral, resp. rate 20, height 5\' 2"  (1.575 m), weight 213 lb 9.6 oz (96.888 kg). Wt Readings from Last 3 Encounters:  08/23/12 213 lb 9.6 oz (96.888 kg)  06/25/12 213 lb 6.4 oz (96.798 kg)  04/10/12 216 lb 1.6 oz (98.022 kg)     General appearance: Well-nourished Caucasian woman HENNT: Pharynx no erythema or exudate Lymph nodes: No cervical, supraclavicular, or axillary adenopathy Breasts: Bilateral mastectomies, no chest wall lesions Lungs: Clear to auscultation, resonant to percussion Heart: Regular rhythm, no murmur, no gallop Abdomen: Soft, obese, nontender, no mass, no organomegaly Extremities: No edema, no calf tenderness Vascular: No cyanosis Neurologic: No focal deficit Skin: No rash or ecchymosis  Lab Results: Lab Results: INR today 1.7 on coumadin 6 mg daily except 5 mg on Mondays  Component Value Date   WBC 7.1 04/10/2012   HGB 13.0 04/10/2012   HCT 38.6 04/10/2012   MCV 90.2 04/10/2012   PLT 260 04/10/2012     Chemistry      Component Value Date/Time   NA 139 08/17/2010 1153   K 3.8 08/17/2010 1153   CL 106 08/17/2010 1153   CO2 22 08/17/2010 1153   BUN 12  08/17/2010 1153   CREATININE 0.98 08/17/2010 1153      Component Value Date/Time   CALCIUM 9.3 08/17/2010 1153   ALKPHOS 93 08/17/2010 1153   AST 21 08/17/2010 1153   ALT 24 08/17/2010 1153   BILITOT 0.7 08/17/2010 1153       Impression and Plan:  #1. Status post left upper extremity venous thrombosis related to elective left mastectomy with subsequent finding of  heterozygote status for the factor V Leiden gene mutation.  Due to ongoing need for hormonal therapy to prevent recurrent breast cancer, she will remain on Coumadin anticoagulation for one more year. She is subtherapeutic today. We will repeat a pro time in 2 weeks. If still subtherapeutic then make a dose adjustment  #2. T2, 4.8 cm, ER/PR positive, HER-2 negative, grade 3, invasive ductal carcinoma right breast treated as outlined above. No evidence for new disease at this time. She is now status post bilateral mastectomies and bilateral oophorectomies. She will continue Arimidex for one more year.   Levert Feinstein, MD 11/17/20139:42 AM

## 2012-08-26 ENCOUNTER — Ambulatory Visit (INDEPENDENT_AMBULATORY_CARE_PROVIDER_SITE_OTHER): Payer: Medicare Other | Admitting: Psychiatry

## 2012-08-26 DIAGNOSIS — F319 Bipolar disorder, unspecified: Secondary | ICD-10-CM

## 2012-08-26 NOTE — Progress Notes (Signed)
Patient:  Jessica Reilly   DOB: 1960-11-16  MR Number: [id number]  Location: [facility]:  [address] [place],  [place], [address]  Start: Monday 08/26/2012 9:00 AM End: Monday 08/26/2012 9:55 AM  Provider/Observer:     Florencia Reasons, MSW, LCSW   Chief Complaint:      Chief Complaint  Patient presents with  . Depression  . Anxiety    Reason For Service:     The patient initially was referred for services by psychiatrist Dr. Lolly Mustache to improve coping and social skills. The patient has a history of severe mood swings, anxiety, and difficulty managing anger along with psychotic symptoms. Patient is seen today for a follow up appointment.  Interventions Strategy:  Supportive therapy cognitive behavioral therapy  Participation Level:   Active  Participation Quality:  Monopolizing      Behavioral Observation:   rapid  pressured speech, talkative  Current Psychosocial Factors: The patient's brother was found dead lying in the road on 09-05-12.  Content of Session:   Reviewing symptoms, processing grief and loss issues, identifying coping techniques   Current Status:   Patient reports sadness, depression, irritability, anger, anxiety, memory difficulty, sleep difficulty, and poor concentration. However, she reports continued absence of  auditory hallucinations but reports seeing shadows and lights out of the corner of her eye. She admits thoughts of wanting others to hurt as she hurts but denies any plan and any intent.  She denies any suicidal and homicidal ideations.  Patient Progress:      Fair. Patient reports her brother was found dead lying in the road on 09/05/2012. Patient reports she and her brother were very close and now experiencing guilt as she had not talked with him since February 2013 as she was angry with her brother. She also expresses anger with her other brother who did not inform patient personally but told her aunt who informed patient. She also  expresses anger and frustration with her deceased brother's widow who excluded patient and her sister from planning her brother's service. Patient also is sad and angry that her brother was cremated. She states feeling as though she had no closure and now does not have a grave where she can go visit her brother. Therapist works with patient to process grief and loss issues as well as discussed strategies patient continues to honor and remember her brother. Patient also expresses anger with God as he took her bother and patient has been dealing with one negative situation after another her entire life per patient's report.  She also expresses fear that God will punish her since she is verbalizing anger with God.  She expresses frustration and resentment regarding other family members as she stated they seem to be going on with their lives and expect her to "get passed it". Patient admits she has not been taking her medications as she no longer has any medication per patient's report. She is scheduled to see Dr. Dan Humphreys on Friday 08/30/2012. Patient agrees to schedule an earlier appointment. Patient has been experiencing increased knee pain. She is scheduled for injections on 09/27/2012.  Target Goals:   1. Decrease/eliminate self-injurious behaviors and anger outbursts. 2. Decrease panic attacks and excessive worry.   Last Reviewed:   11/22/2010   Goals Addressed Today:         Goals 1 andl 2  Impression/Diagnosis:   The patient has a history of mood swings, anger, depression, insomnia, anxiety, and panic attacks along with irritability. She  reports becoming angry easily and having a short fuse. The patient also experiences paranoia, hallucinations, and disturbing intrusive thoughts. Diagnosis of bipolar disorder NOS, rule out schizophrenia   Diagnosis:  Axis I:  1. Bipolar disorder             Axis II: Deferred

## 2012-08-26 NOTE — Patient Instructions (Signed)
Discussed orally 

## 2012-08-28 ENCOUNTER — Encounter (HOSPITAL_COMMUNITY): Payer: Self-pay | Admitting: Psychiatry

## 2012-08-28 ENCOUNTER — Ambulatory Visit (INDEPENDENT_AMBULATORY_CARE_PROVIDER_SITE_OTHER): Payer: Medicare Other | Admitting: Psychiatry

## 2012-08-28 VITALS — BP 120/90 | HR 72 | Ht 62.0 in | Wt 216.8 lb

## 2012-08-28 DIAGNOSIS — F319 Bipolar disorder, unspecified: Secondary | ICD-10-CM

## 2012-08-28 DIAGNOSIS — F603 Borderline personality disorder: Secondary | ICD-10-CM

## 2012-08-28 DIAGNOSIS — F419 Anxiety disorder, unspecified: Secondary | ICD-10-CM

## 2012-08-28 DIAGNOSIS — F200 Paranoid schizophrenia: Secondary | ICD-10-CM

## 2012-08-28 MED ORDER — RISPERIDONE 0.25 MG PO TABS: 0.1250 mg | ORAL_TABLET | Freq: Two times a day (BID) | ORAL | Status: DC

## 2012-08-28 MED ORDER — PROPRANOLOL HCL 10 MG PO TABS: 10.0000 mg | ORAL_TABLET | Freq: Three times a day (TID) | ORAL | Status: DC

## 2012-08-28 MED ORDER — RISPERIDONE 1 MG PO TABS: 1.0000 mg | ORAL_TABLET | Freq: Every day | ORAL | Status: DC

## 2012-08-28 NOTE — Patient Instructions (Signed)
Meditation could be very helpful.  Chakra Healing Chants Sophia  From [email or web address] could be helpful.
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
## 2012-08-30 ENCOUNTER — Ambulatory Visit (HOSPITAL_COMMUNITY): Payer: Self-pay | Admitting: Psychiatry

## 2012-09-03 ENCOUNTER — Ambulatory Visit (HOSPITAL_COMMUNITY): Payer: Self-pay | Admitting: Psychiatry

## 2012-09-09 ENCOUNTER — Ambulatory Visit (HOSPITAL_BASED_OUTPATIENT_CLINIC_OR_DEPARTMENT_OTHER): Payer: Medicare Other | Admitting: Pharmacist

## 2012-09-09 ENCOUNTER — Other Ambulatory Visit (HOSPITAL_BASED_OUTPATIENT_CLINIC_OR_DEPARTMENT_OTHER): Payer: Medicare Other | Admitting: Lab

## 2012-09-09 DIAGNOSIS — C50919 Malignant neoplasm of unspecified site of unspecified female breast: Secondary | ICD-10-CM

## 2012-09-09 DIAGNOSIS — D6851 Activated protein C resistance: Secondary | ICD-10-CM

## 2012-09-09 DIAGNOSIS — I82629 Acute embolism and thrombosis of deep veins of unspecified upper extremity: Secondary | ICD-10-CM

## 2012-09-09 DIAGNOSIS — D6859 Other primary thrombophilia: Secondary | ICD-10-CM

## 2012-09-09 DIAGNOSIS — D682 Hereditary deficiency of other clotting factors: Secondary | ICD-10-CM

## 2012-09-09 DIAGNOSIS — D689 Coagulation defect, unspecified: Secondary | ICD-10-CM

## 2012-09-09 LAB — PROTIME-INR
INR: 1.7 — ABNORMAL LOW (ref 2.00–3.50)
Protime: 20.4 Seconds — ABNORMAL HIGH (ref 10.6–13.4)

## 2012-09-09 NOTE — Progress Notes (Signed)
Patient's INR is still slightly subtherapeutic at 1.7. She notes that she recently had steroid shots in both of her knees (on 08/27/12). Due to this and her subtherapeutic INR, will increase her dose to 6 mg daily except for 7 mg on Mondays and Fridays.  Will recheck INR in 2 weeks on 09/20/12.

## 2012-09-09 NOTE — Patient Instructions (Signed)
Change coumadin dose to take 6 mg daily except for 7 mg on Mondays and Fridays.  Recheck INR 09/20/12 at 11:30 am for lab; 11:45 am for Coumadin clinic.

## 2012-09-20 ENCOUNTER — Ambulatory Visit (HOSPITAL_BASED_OUTPATIENT_CLINIC_OR_DEPARTMENT_OTHER): Payer: Medicare Other | Admitting: Pharmacist

## 2012-09-20 ENCOUNTER — Other Ambulatory Visit (HOSPITAL_BASED_OUTPATIENT_CLINIC_OR_DEPARTMENT_OTHER): Payer: Medicare Other | Admitting: Lab

## 2012-09-20 DIAGNOSIS — D6859 Other primary thrombophilia: Secondary | ICD-10-CM

## 2012-09-20 DIAGNOSIS — D682 Hereditary deficiency of other clotting factors: Secondary | ICD-10-CM

## 2012-09-20 DIAGNOSIS — I82409 Acute embolism and thrombosis of unspecified deep veins of unspecified lower extremity: Secondary | ICD-10-CM

## 2012-09-20 DIAGNOSIS — Z7901 Long term (current) use of anticoagulants: Secondary | ICD-10-CM

## 2012-09-20 LAB — PROTIME-INR: Protime: 24 Seconds — ABNORMAL HIGH (ref 10.6–13.4)

## 2012-09-20 NOTE — Progress Notes (Signed)
INR slightly increased to 2.00 from 1.7.  Will change coumadin dose to take 7 mg daily except for 6 mg on T/H/Sa. Recheck INR 10/15/12 at 11:30 am for lab; 11:45 am for Coumadin clinic.  No other changes to report.  Will call [facility] [phone] with new instructions.

## 2012-09-20 NOTE — Patient Instructions (Signed)
Change coumadin dose to take 7 mg daily except for 6 mg on T/H/Sa. Recheck INR 10/15/12 at 11:30 am for lab; 11:45 am for Coumadin clinic.

## 2012-10-10 ENCOUNTER — Ambulatory Visit (INDEPENDENT_AMBULATORY_CARE_PROVIDER_SITE_OTHER): Payer: Medicare Other | Admitting: Otolaryngology

## 2012-10-10 DIAGNOSIS — H9209 Otalgia, unspecified ear: Secondary | ICD-10-CM

## 2012-10-10 DIAGNOSIS — H6980 Other specified disorders of Eustachian tube, unspecified ear: Secondary | ICD-10-CM

## 2012-10-10 DIAGNOSIS — H6120 Impacted cerumen, unspecified ear: Secondary | ICD-10-CM

## 2012-10-15 ENCOUNTER — Other Ambulatory Visit: Payer: Self-pay | Admitting: Lab

## 2012-10-15 ENCOUNTER — Ambulatory Visit: Payer: Self-pay

## 2012-10-15 ENCOUNTER — Telehealth: Payer: Self-pay | Admitting: Pharmacist

## 2012-10-17 ENCOUNTER — Ambulatory Visit (HOSPITAL_BASED_OUTPATIENT_CLINIC_OR_DEPARTMENT_OTHER): Payer: Medicare Other | Admitting: Pharmacist

## 2012-10-17 ENCOUNTER — Other Ambulatory Visit (HOSPITAL_BASED_OUTPATIENT_CLINIC_OR_DEPARTMENT_OTHER): Payer: Medicare Other | Admitting: Lab

## 2012-10-17 DIAGNOSIS — D682 Hereditary deficiency of other clotting factors: Secondary | ICD-10-CM

## 2012-10-17 DIAGNOSIS — D6859 Other primary thrombophilia: Secondary | ICD-10-CM

## 2012-10-17 DIAGNOSIS — I82409 Acute embolism and thrombosis of unspecified deep veins of unspecified lower extremity: Secondary | ICD-10-CM

## 2012-10-17 LAB — POCT INR: INR: 2.3

## 2012-10-17 LAB — PROTIME-INR
INR: 2.3 (ref 2.00–3.50)
Protime: 27.6 Seconds — ABNORMAL HIGH (ref 10.6–13.4)

## 2012-10-17 NOTE — Progress Notes (Signed)
Pt is therapeutic today at 2.3.  She is dosing her coumadin in a daily medicine dispenser. Continue coumadin dose to take 7 mg daily except for 6 mg on T/H/Sa. Recheck INR 11/14/12 at 11:30 am for lab; 11:45 am for Coumadin clinic.

## 2012-10-17 NOTE — Patient Instructions (Signed)
Continue coumadin dose to take 7 mg daily except for 6 mg on T/H/Sa. Recheck INR 11/14/12 at 11:30 am for lab; 11:45 am for Coumadin clinic.

## 2012-11-11 ENCOUNTER — Other Ambulatory Visit: Payer: Self-pay | Admitting: Pharmacist

## 2012-11-11 DIAGNOSIS — I82629 Acute embolism and thrombosis of deep veins of unspecified upper extremity: Secondary | ICD-10-CM

## 2012-11-11 DIAGNOSIS — C50919 Malignant neoplasm of unspecified site of unspecified female breast: Secondary | ICD-10-CM

## 2012-11-11 DIAGNOSIS — D6851 Activated protein C resistance: Secondary | ICD-10-CM

## 2012-11-11 DIAGNOSIS — D689 Coagulation defect, unspecified: Secondary | ICD-10-CM

## 2012-11-11 MED ORDER — WARFARIN SODIUM 5 MG PO TABS: 5.0000 mg | ORAL_TABLET | Freq: Every day | ORAL | Status: DC

## 2012-11-13 ENCOUNTER — Other Ambulatory Visit (HOSPITAL_BASED_OUTPATIENT_CLINIC_OR_DEPARTMENT_OTHER): Payer: Medicare Other | Admitting: Lab

## 2012-11-13 ENCOUNTER — Ambulatory Visit (HOSPITAL_BASED_OUTPATIENT_CLINIC_OR_DEPARTMENT_OTHER): Payer: Medicare Other | Admitting: Pharmacist

## 2012-11-13 DIAGNOSIS — D6859 Other primary thrombophilia: Secondary | ICD-10-CM

## 2012-11-13 DIAGNOSIS — D682 Hereditary deficiency of other clotting factors: Secondary | ICD-10-CM

## 2012-11-13 LAB — PROTIME-INR
INR: 2.4 (ref 2.00–3.50)
Protime: 28.8 Seconds — ABNORMAL HIGH (ref 10.6–13.4)

## 2012-11-13 NOTE — Progress Notes (Signed)
INR therapeutic (2.4) on 7mg  daily except 6mg  on TuThSa. No changes.  No complaints.  Continue current dose.  Recheck INR in 6 weeks.

## 2012-11-14 ENCOUNTER — Ambulatory Visit: Payer: Self-pay

## 2012-11-14 ENCOUNTER — Other Ambulatory Visit: Payer: Self-pay | Admitting: Lab

## 2012-11-15 ENCOUNTER — Ambulatory Visit (HOSPITAL_COMMUNITY): Payer: Self-pay | Admitting: Psychiatry

## 2012-11-20 ENCOUNTER — Ambulatory Visit (HOSPITAL_COMMUNITY): Payer: Self-pay | Admitting: Psychiatry

## 2012-11-26 ENCOUNTER — Ambulatory Visit (INDEPENDENT_AMBULATORY_CARE_PROVIDER_SITE_OTHER): Payer: Medicare Other | Admitting: Psychiatry

## 2012-11-26 DIAGNOSIS — F411 Generalized anxiety disorder: Secondary | ICD-10-CM

## 2012-11-26 DIAGNOSIS — F319 Bipolar disorder, unspecified: Secondary | ICD-10-CM

## 2012-11-26 DIAGNOSIS — F419 Anxiety disorder, unspecified: Secondary | ICD-10-CM
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
## 2012-11-26 NOTE — Patient Instructions (Signed)
Discussed orally 

## 2012-12-02 NOTE — Telephone Encounter (Signed)
Refills called in 

## 2012-12-05 ENCOUNTER — Telehealth: Payer: Self-pay | Admitting: Oncology

## 2012-12-05 NOTE — Telephone Encounter (Signed)
Spoke with pt gv appt d/t...td

## 2012-12-10 ENCOUNTER — Ambulatory Visit (HOSPITAL_COMMUNITY): Payer: Self-pay | Admitting: Psychiatry

## 2012-12-17 ENCOUNTER — Ambulatory Visit (INDEPENDENT_AMBULATORY_CARE_PROVIDER_SITE_OTHER): Payer: Medicare Other | Admitting: Psychiatry

## 2012-12-17 ENCOUNTER — Encounter (HOSPITAL_COMMUNITY): Payer: Self-pay | Admitting: Psychiatry

## 2012-12-17 VITALS — Wt 214.8 lb

## 2012-12-17 DIAGNOSIS — F419 Anxiety disorder, unspecified: Secondary | ICD-10-CM

## 2012-12-17 DIAGNOSIS — F603 Borderline personality disorder: Secondary | ICD-10-CM

## 2012-12-17 DIAGNOSIS — F411 Generalized anxiety disorder: Secondary | ICD-10-CM | POA: Insufficient documentation

## 2012-12-17 DIAGNOSIS — F319 Bipolar disorder, unspecified: Secondary | ICD-10-CM

## 2012-12-17 MED ORDER — ESCITALOPRAM OXALATE 10 MG PO TABS: 10.0000 mg | ORAL_TABLET | Freq: Every day | ORAL | Status: DC

## 2012-12-17 MED ORDER — PROPRANOLOL HCL 10 MG PO TABS: 10.0000 mg | ORAL_TABLET | Freq: Three times a day (TID) | ORAL | Status: DC

## 2012-12-17 NOTE — Patient Instructions (Addendum)
Try the Inderal and the Lexapro for your anxiety  Native Wisdom for White Minds by Wyn Quaker Schafe may be an interesting resource for you.  Adult children of alcoholics has some amazing literature available on line.  The work book is awesome  Ezzie Dural book "Man's search for meaning" is also awesome.  Call if problems or concerns.
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
## 2012-12-18 ENCOUNTER — Ambulatory Visit (INDEPENDENT_AMBULATORY_CARE_PROVIDER_SITE_OTHER): Payer: Medicare Other | Admitting: Psychiatry

## 2012-12-18 DIAGNOSIS — F411 Generalized anxiety disorder: Secondary | ICD-10-CM

## 2012-12-18 DIAGNOSIS — F319 Bipolar disorder, unspecified: Secondary | ICD-10-CM

## 2012-12-18 NOTE — Patient Instructions (Addendum)
Complete Y-BOCS Screener  Contact support group  Women's Cancer Support Group  [facility] [address] [place], [place] [address] Fourth Thursday of the month at 10:30 am Contacts: Howard Pouch [phone], Tyrone Apple [phone]

## 2012-12-18 NOTE — Progress Notes (Signed)
Patient:  Jessica Reilly   DOB: 02-Mar-1961  MR Number: [id number]  Location: [facility]al Health Center:  [address] [place] [place],  [place], [address]  Start: Wednesday 12/18/2012 9:00 AM End: Wednesday 3/12 2014 10:00 AM  Provider/Observer:     Florencia Reasons, MSW, LCSW   Chief Complaint:      Chief Complaint  Patient presents with  . Depression  . Anxiety    Reason For Service:     The patient initially was referred for services by psychiatrist Dr. Lolly Mustache to improve coping and social skills. The patient has a history of severe mood swings, anxiety, and difficulty managing anger along with psychotic symptoms. Patient is seen today for a follow up appointment.  Interventions Strategy:  Supportive therapy, cognitive behavioral therapy  Participation Level:   Active  Participation Quality:  Monopolizing      Behavioral Observation:    Talkative, tearful  Current Psychosocial Factors:   Content of Session:   Reviewing symptoms, processing grief and loss issues related to the effects of patient's cancer diagnosis and treatment, identifying triggers of anxiety, exploring community resources   Current Status:   Patient reports continue, sadness, depression, irritability, anger, anxiety, memory difficulty, sleep difficulty, and poor concentration. She also reports a poor self image and obsessions as well as compulsions.  Patient Progress:      Fair. Patient reports continued anxiety and irritability and shares becoming very angry easily when things are out of routine and order. She shares incidents of becoming angry when her cable channels were changed and there was a change regarding the procedure her to have her prescriptions filled. Patient also shares with therapist that everything  has to be organized in her home and that items such as books have to be even on the shelf. She reports having to buy items in sets of 2. Therapist and patient discuss the possibility of patient having symptoms of  OCD. Patient agrees to complete the Y-BOCS for next session. Patient reports increased thoughts and sadness regarding her self image and physical appearance.. Patient states disliking the way he she looks and reports severe discomfort with her appearance after having a double mastectomy. She states not really feeling like a woman and avoiding intimacy with her boyfriend. She also expresses frustration regarding thinning hair. Therapist works with patient to process her feelings, to encourage patient to attend a cancer support group, and provide patient with contact information.  Patient reports meeting with Dr. Dan Humphreys yesterday and planning to comply with medication treatment.   Target Goals:   1. Decrease/eliminate self-injurious behaviors and anger outbursts. 2. Decrease panic attacks and excessive worry.   Last Reviewed:   11/22/2010   Goals Addressed Today:         Goals 1 and 2  Impression/Diagnosis:   The patient has a history of mood swings, anger, depression, insomnia, anxiety, and panic attacks along with irritability. She reports becoming angry easily and having a short fuse. The patient also experiences paranoia, hallucinations, and disturbing intrusive thoughts. Diagnosis of bipolar disorder NOS, generalized anxiety disorder, rule out OCD ,rule out schizophrenia   Diagnosis:  Axis I:  Bipolar disorder  Generalized anxiety disorder          Axis II: Deferred

## 2012-12-25 ENCOUNTER — Other Ambulatory Visit (HOSPITAL_BASED_OUTPATIENT_CLINIC_OR_DEPARTMENT_OTHER): Payer: Medicare Other | Admitting: Lab

## 2012-12-25 ENCOUNTER — Ambulatory Visit (HOSPITAL_BASED_OUTPATIENT_CLINIC_OR_DEPARTMENT_OTHER): Payer: Medicare Other | Admitting: Pharmacist

## 2012-12-25 DIAGNOSIS — D682 Hereditary deficiency of other clotting factors: Secondary | ICD-10-CM

## 2012-12-25 LAB — PROTIME-INR
INR: 2 (ref 2.00–3.50)
Protime: 24 Seconds — ABNORMAL HIGH (ref 10.6–13.4)

## 2012-12-25 NOTE — Progress Notes (Signed)
INR = 2 on Coumadin 7 mg/day; 6 mg Tu/Th/Sat Pt has new RX for Lexapro & Propranolol but she does not want to take these.  She is aware that both of these drugs have potential to increase her INR.  If she does decide to start taking either/both, she will call us so we can check her INR. INR is stable & we will leave her dose as-is & see her in 6 weeks. Ebony Hail, Pharm.D., CPP 12/25/2012@11 :18 AM

## 2012-12-30 ENCOUNTER — Encounter: Payer: Self-pay | Admitting: Adult Health

## 2012-12-31 ENCOUNTER — Other Ambulatory Visit: Payer: Self-pay | Admitting: Adult Health

## 2012-12-31 ENCOUNTER — Other Ambulatory Visit (HOSPITAL_COMMUNITY)
Admission: RE | Admit: 2012-12-31 | Discharge: 2012-12-31 | Disposition: A | Discharge status: Home / Self Care | Payer: Medicare Other | Source: Ambulatory Visit | Attending: Obstetrics and Gynecology | Admitting: Obstetrics and Gynecology

## 2012-12-31 ENCOUNTER — Encounter: Payer: Self-pay | Admitting: Adult Health

## 2012-12-31 ENCOUNTER — Ambulatory Visit (INDEPENDENT_AMBULATORY_CARE_PROVIDER_SITE_OTHER): Payer: Medicare Other | Admitting: Adult Health

## 2012-12-31 VITALS — BP 142/96 | HR 78 | Ht 62.0 in | Wt 220.0 lb

## 2012-12-31 DIAGNOSIS — Z0000 Encounter for general adult medical examination without abnormal findings: Secondary | ICD-10-CM

## 2012-12-31 DIAGNOSIS — Z1151 Encounter for screening for human papillomavirus (HPV): Secondary | ICD-10-CM | POA: Insufficient documentation

## 2012-12-31 DIAGNOSIS — Z853 Personal history of malignant neoplasm of breast: Secondary | ICD-10-CM

## 2012-12-31 DIAGNOSIS — I10 Essential (primary) hypertension: Secondary | ICD-10-CM

## 2012-12-31 DIAGNOSIS — Z01419 Encounter for gynecological examination (general) (routine) without abnormal findings: Secondary | ICD-10-CM | POA: Insufficient documentation

## 2012-12-31 DIAGNOSIS — F411 Generalized anxiety disorder: Secondary | ICD-10-CM

## 2012-12-31 DIAGNOSIS — Z1212 Encounter for screening for malignant neoplasm of rectum: Secondary | ICD-10-CM

## 2012-12-31 DIAGNOSIS — E669 Obesity, unspecified: Secondary | ICD-10-CM

## 2012-12-31 DIAGNOSIS — E7800 Pure hypercholesterolemia, unspecified: Secondary | ICD-10-CM

## 2012-12-31 HISTORY — DX: Essential (primary) hypertension: I10

## 2012-12-31 LAB — HEMOCCULT GUIAC POC 1CARD (OFFICE)

## 2012-12-31 NOTE — Patient Instructions (Addendum)
Continue meds as prescribed, follow up 1 year for physical. Sign up for my chart

## 2012-12-31 NOTE — Progress Notes (Signed)
Subjective:     Patient ID: Jessica Reilly, female   DOB: November 30, 1960, 52 y.o.   MRN: [id number]  HPI Zaelyn is a 52 year old white female married, in today for Pap and physical. Rox has a history of breast cancer with a bilateral mastectomy and bilateral ovary removal.   Review of Systems Patient denies any headaches, blurred vision, shortness of breath, chest pain, abdominal pain, problems with bowel movements, urination, and she is not having sex.She does say she does not sleep well and that her knees hurt all the time(she sees Dr. Eulah Pont).She is stressed over family issues, but sees a therapist at [facility] and has been prescribed medication by Dr. Dan Humphreys( I don't think she is taking it). She has not gotten her colonoscopy with Dr. Karilyn Cota yet.She most recently has  Some swelling below her left ear and has an appointment with an ENT 01/02/2013.Says hair not growing well. Reviewed past medical, surgical, social and family history. Reviewed allergy to medications.     Objective:   Physical ExamVital signs: Blood pressure 142/96 weight, 220lbs.,height 52 inches, pulse 78.Skin: Warm and dry WUJ:WJXBJY nasal nares, no hearing loss, there is swelling below the left ear with a small nodule present,nontender and non mobile. Neck: Midline trachea. Thyroid normal Lungs: Clear to auscultation bilaterally. Breasts: No dominant palpable mass on chest wall, breasts surgically absent. Cardiovascular: Regular rate and rhythm. Abdomen: Soft and non-tender, no hepatosplenomegaly. Pelvic: External genitalia is normal and appearance. The vagina is normal in appearance. Cervix is bulbous with negative cervical motion tenderness. Uterus felt to be normal size shape and contour. No adnexal masses or tenderness noted.Rectal exam: Good sphincter tone, no polyps, no hemorrhoids. Hemoccult negative. Extremities: no swelling or varicosities.Psych: anxious     Assessment:     Yearly Exam, history breast cancer,  GAD, sleep disturbance   Plan:    Try melatonin OTC for sleep  Check CBC, CMP,TSH, LIPIDS Follow up 1 year Physical, prn problems

## 2013-01-01 ENCOUNTER — Ambulatory Visit (INDEPENDENT_AMBULATORY_CARE_PROVIDER_SITE_OTHER): Payer: Medicare Other | Admitting: Psychiatry

## 2013-01-01 DIAGNOSIS — F319 Bipolar disorder, unspecified: Secondary | ICD-10-CM

## 2013-01-01 LAB — LIPID PANEL
HDL: 40 mg/dL (ref >39)
LDL Cholesterol: 92 mg/dL (ref 0–99)
Total CHOL/HDL Ratio: 4 Ratio

## 2013-01-01 LAB — CBC
MCH: 28.9 pg (ref 26.0–34.0)
MCV: 83.9 fL (ref 78.0–100.0)
Platelets: 280 10*3/uL (ref 150–400)
RDW: 14.7 % (ref 11.5–15.5)
WBC: 6.7 10*3/uL (ref 4.0–10.5)

## 2013-01-01 LAB — COMPREHENSIVE METABOLIC PANEL
ALT: 29 U/L (ref 0–35)
AST: 27 U/L (ref 0–37)
Creat: 1.05 mg/dL (ref 0.50–1.10)
Total Bilirubin: 0.3 mg/dL (ref 0.3–1.2)

## 2013-01-02 ENCOUNTER — Telehealth: Payer: Self-pay | Admitting: Adult Health

## 2013-01-02 ENCOUNTER — Ambulatory Visit (INDEPENDENT_AMBULATORY_CARE_PROVIDER_SITE_OTHER): Payer: Medicare Other | Admitting: Otolaryngology

## 2013-01-02 DIAGNOSIS — D37039 Neoplasm of uncertain behavior of the major salivary glands, unspecified: Secondary | ICD-10-CM

## 2013-01-02 NOTE — Telephone Encounter (Signed)
Called pt. With results of labs, will mail her a copy.
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
## 2013-01-02 NOTE — Patient Instructions (Signed)
Discussed orally 

## 2013-01-03 ENCOUNTER — Emergency Department (HOSPITAL_COMMUNITY): Payer: Medicare Other

## 2013-01-03 ENCOUNTER — Encounter (HOSPITAL_COMMUNITY): Payer: Self-pay | Admitting: Emergency Medicine

## 2013-01-03 ENCOUNTER — Emergency Department (HOSPITAL_COMMUNITY)
Admission: EM | Admit: 2013-01-03 | Discharge: 2013-01-04 | Disposition: A | Discharge status: Home / Self Care | Payer: Medicare Other | Attending: Emergency Medicine | Admitting: Emergency Medicine

## 2013-01-03 DIAGNOSIS — Z7901 Long term (current) use of anticoagulants: Secondary | ICD-10-CM | POA: Insufficient documentation

## 2013-01-03 DIAGNOSIS — K219 Gastro-esophageal reflux disease without esophagitis: Secondary | ICD-10-CM | POA: Insufficient documentation

## 2013-01-03 DIAGNOSIS — I10 Essential (primary) hypertension: Secondary | ICD-10-CM | POA: Insufficient documentation

## 2013-01-03 DIAGNOSIS — Z8739 Personal history of other diseases of the musculoskeletal system and connective tissue: Secondary | ICD-10-CM | POA: Insufficient documentation

## 2013-01-03 DIAGNOSIS — E7800 Pure hypercholesterolemia, unspecified: Secondary | ICD-10-CM | POA: Insufficient documentation

## 2013-01-03 DIAGNOSIS — F410 Panic disorder [episodic paroxysmal anxiety] without agoraphobia: Secondary | ICD-10-CM | POA: Insufficient documentation

## 2013-01-03 DIAGNOSIS — Z86718 Personal history of other venous thrombosis and embolism: Secondary | ICD-10-CM | POA: Insufficient documentation

## 2013-01-03 DIAGNOSIS — S0101XA Laceration without foreign body of scalp, initial encounter: Secondary | ICD-10-CM

## 2013-01-03 DIAGNOSIS — Z8719 Personal history of other diseases of the digestive system: Secondary | ICD-10-CM | POA: Insufficient documentation

## 2013-01-03 DIAGNOSIS — S0100XA Unspecified open wound of scalp, initial encounter: Secondary | ICD-10-CM | POA: Insufficient documentation

## 2013-01-03 DIAGNOSIS — IMO0002 Reserved for concepts with insufficient information to code with codable children: Secondary | ICD-10-CM | POA: Insufficient documentation

## 2013-01-03 DIAGNOSIS — Z853 Personal history of malignant neoplasm of breast: Secondary | ICD-10-CM | POA: Insufficient documentation

## 2013-01-03 DIAGNOSIS — R011 Cardiac murmur, unspecified: Secondary | ICD-10-CM | POA: Insufficient documentation

## 2013-01-03 DIAGNOSIS — Z87442 Personal history of urinary calculi: Secondary | ICD-10-CM | POA: Insufficient documentation

## 2013-01-03 DIAGNOSIS — Z8742 Personal history of other diseases of the female genital tract: Secondary | ICD-10-CM | POA: Insufficient documentation

## 2013-01-03 DIAGNOSIS — Z79899 Other long term (current) drug therapy: Secondary | ICD-10-CM | POA: Insufficient documentation

## 2013-01-03 DIAGNOSIS — S300XXA Contusion of lower back and pelvis, initial encounter: Secondary | ICD-10-CM

## 2013-01-03 DIAGNOSIS — Z3202 Encounter for pregnancy test, result negative: Secondary | ICD-10-CM | POA: Insufficient documentation

## 2013-01-03 DIAGNOSIS — S0993XA Unspecified injury of face, initial encounter: Secondary | ICD-10-CM | POA: Insufficient documentation

## 2013-01-03 DIAGNOSIS — Z862 Personal history of diseases of the blood and blood-forming organs and certain disorders involving the immune mechanism: Secondary | ICD-10-CM

## 2013-01-03 LAB — COMPREHENSIVE METABOLIC PANEL
ALT: 31 U/L (ref 0–35)
Alkaline Phosphatase: 93 U/L (ref 39–117)
BUN: 15 mg/dL (ref 6–23)
CO2: 24 mEq/L (ref 19–32)
Chloride: 102 mEq/L (ref 96–112)
GFR calc Af Amer: 67 mL/min — ABNORMAL LOW (ref >90)
GFR calc non Af Amer: 58 mL/min — ABNORMAL LOW (ref >90)
Glucose, Bld: 115 mg/dL — ABNORMAL HIGH (ref 70–99)
Potassium: 4 mEq/L (ref 3.5–5.1)
Sodium: 139 mEq/L (ref 135–145)
Total Bilirubin: 0.3 mg/dL (ref 0.3–1.2)

## 2013-01-03 LAB — CBC WITH DIFFERENTIAL/PLATELET
Hemoglobin: 12.9 g/dL (ref 12.0–15.0)
Lymphocytes Relative: 27 % (ref 12–46)
Lymphs Abs: 2.6 10*3/uL (ref 0.7–4.0)
Monocytes Relative: 5 % (ref 3–12)
Neutrophils Relative %: 66 % (ref 43–77)
Platelets: 224 10*3/uL (ref 150–400)
RBC: 4.28 MIL/uL (ref 3.87–5.11)
WBC: 9.5 10*3/uL (ref 4.0–10.5)

## 2013-01-03 LAB — URINE MICROSCOPIC-ADD ON

## 2013-01-03 LAB — URINALYSIS, ROUTINE W REFLEX MICROSCOPIC
Bilirubin Urine: NEGATIVE
Ketones, ur: NEGATIVE mg/dL
Nitrite: NEGATIVE
Protein, ur: NEGATIVE mg/dL
Urobilinogen, UA: 0.2 mg/dL (ref 0.0–1.0)
pH: 6 (ref 5.0–8.0)

## 2013-01-03 LAB — APTT: aPTT: 32 seconds (ref 24–37)

## 2013-01-03 MED ORDER — HYDROMORPHONE HCL PF 1 MG/ML IJ SOLN
1.0000 mg | Freq: Once | INTRAMUSCULAR | Status: AC
  Administered 2013-01-03: 1 mg via INTRAVENOUS
  Filled 2013-01-03: qty 1

## 2013-01-03 MED ORDER — PROMETHAZINE HCL 25 MG/ML IJ SOLN
25.0000 mg | Freq: Once | INTRAMUSCULAR | Status: AC
  Administered 2013-01-03: 25 mg via INTRAVENOUS
  Filled 2013-01-03: qty 1

## 2013-01-03 MED ORDER — SODIUM CHLORIDE 0.9 % IV SOLN
INTRAVENOUS | Status: DC
  Administered 2013-01-03: 23:00:00 via INTRAVENOUS
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
## 2013-01-04 ENCOUNTER — Emergency Department (HOSPITAL_COMMUNITY): Payer: Medicare Other

## 2013-01-04 MED ORDER — ONDANSETRON 4 MG PO TBDP: 4.0000 mg | ORAL_TABLET | Freq: Three times a day (TID) | ORAL | Status: DC | PRN

## 2013-01-04 MED ORDER — IOHEXOL 300 MG/ML  SOLN
100.0000 mL | Freq: Once | INTRAMUSCULAR | Status: AC | PRN
  Administered 2013-01-04: 100 mL via INTRAVENOUS

## 2013-01-04 MED ORDER — HYDROCODONE-ACETAMINOPHEN 5-325 MG PO TABS: 1.0000 | ORAL_TABLET | ORAL | Status: DC | PRN

## 2013-01-04 NOTE — ED Provider Notes (Signed)
Medical screening examination/treatment/procedure(s) were performed by non-physician practitioner and as supervising physician I was immediately available for consultation/collaboration.   Carleene Cooper III, MD 01/04/13 [phone]

## 2013-01-04 NOTE — ED Provider Notes (Signed)
LACERATION REPAIR Performed by: Collen Hostler Authorized by: Eline Geng Consent: Verbal consent obtained. Risks and benefits: risks, benefits and alternatives were discussed Consent given by: patient Patient identity confirmedn: provided demographic data  Wound explored  Laceration Location: scalp  Laceration Length: 3 cm  No Foreign Bodies seen or palpated  Irrigation method: NSS and sterile 4x4 gauze  Amount of cleaning: standard  Skin closure: staples  Number of staples: 3  Patient tolerance: Patient tolerated the procedure well with no immediate complications.   [facility] Orlene Och, NP 01/04/13 0111

## 2013-01-04 NOTE — ED Provider Notes (Deleted)
[id number] Have been advised by Sunny Schlein, ACT that patient has been accepted by Dr. Dub Mikes.   Nicoletta Dress. Colon Branch, MD 01/04/13 [phone]
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
## 2013-01-05 LAB — URINE CULTURE

## 2013-01-06 ENCOUNTER — Telehealth: Payer: Self-pay | Admitting: Pharmacist

## 2013-01-06 ENCOUNTER — Other Ambulatory Visit (INDEPENDENT_AMBULATORY_CARE_PROVIDER_SITE_OTHER): Payer: Self-pay | Admitting: Otolaryngology

## 2013-01-06 DIAGNOSIS — R221 Localized swelling, mass and lump, neck: Secondary | ICD-10-CM
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
## 2013-01-07 ENCOUNTER — Telehealth: Payer: Self-pay | Admitting: Pharmacist

## 2013-01-07 NOTE — Telephone Encounter (Signed)
Pt called today and said that she didn't take as much coumadin over the weekend as she previously thought. She took Coumadin 7mg  on Sat (3/29) and Sun (3/30) - She originally thought she took 8mg  on Sat (3/29) and 9mg  on Sun (3/30). Pt is not able to come to her lab appt at 11am on 01/09/13. She needs to be able to come for lab at 9am on 01/09/13 and she is not able to stay for the coumadin clinic visit that day due to transportation reasons. Sent note to scheduler to move lab time to 9am on 01/09/13 and cancel coumadin clinic appt for 01/09/13. She would like [name] to call her at [phone] to review INR results, make any coumadin dose adjustments, review any concerns and finalize her next appt. She has a return appointment for 02/05/13 at 11am for lab and 11:15am for coumadin clinic.

## 2013-01-08 ENCOUNTER — Ambulatory Visit (HOSPITAL_COMMUNITY): Payer: Self-pay | Admitting: Psychiatry

## 2013-01-08 ENCOUNTER — Ambulatory Visit (HOSPITAL_COMMUNITY): Payer: Medicare Other

## 2013-01-09 ENCOUNTER — Ambulatory Visit: Payer: Self-pay | Admitting: Pharmacist

## 2013-01-09 ENCOUNTER — Ambulatory Visit: Payer: Self-pay

## 2013-01-09 ENCOUNTER — Other Ambulatory Visit (HOSPITAL_BASED_OUTPATIENT_CLINIC_OR_DEPARTMENT_OTHER): Payer: Medicare Other | Admitting: Lab

## 2013-01-09 DIAGNOSIS — D682 Hereditary deficiency of other clotting factors: Secondary | ICD-10-CM

## 2013-01-09 LAB — PROTIME-INR
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
## 2013-01-13 ENCOUNTER — Encounter (HOSPITAL_COMMUNITY): Payer: Self-pay | Admitting: *Deleted

## 2013-01-13 ENCOUNTER — Emergency Department (HOSPITAL_COMMUNITY)
Admission: EM | Admit: 2013-01-13 | Discharge: 2013-01-13 | Disposition: A | Discharge status: Home / Self Care | Payer: Medicare Other | Attending: Emergency Medicine | Admitting: Emergency Medicine

## 2013-01-13 DIAGNOSIS — Z4802 Encounter for removal of sutures: Secondary | ICD-10-CM | POA: Insufficient documentation

## 2013-01-13 DIAGNOSIS — T07XXXA Unspecified multiple injuries, initial encounter: Secondary | ICD-10-CM

## 2013-01-13 DIAGNOSIS — S161XXD Strain of muscle, fascia and tendon at neck level, subsequent encounter: Secondary | ICD-10-CM
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
## 2013-01-13 NOTE — ED Notes (Addendum)
Here for recheck and staple removal from scalp,  Says she has a large contusion to lt buttock and neck pain.
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
## 2013-01-13 NOTE — ED Notes (Signed)
3 staples removed from scalp

## 2013-01-13 NOTE — ED Provider Notes (Signed)
Medical screening examination/treatment/procedure(s) were performed by non-physician practitioner and as supervising physician I was immediately available for consultation/collaboration.   Shelda Jakes, MD 01/13/13 [phone]

## 2013-01-15 ENCOUNTER — Ambulatory Visit (HOSPITAL_COMMUNITY): Payer: Self-pay | Admitting: Psychiatry

## 2013-01-28 ENCOUNTER — Ambulatory Visit (HOSPITAL_COMMUNITY): Payer: Self-pay | Admitting: Psychiatry

## 2013-01-30 ENCOUNTER — Ambulatory Visit (INDEPENDENT_AMBULATORY_CARE_PROVIDER_SITE_OTHER): Payer: Medicare Other | Admitting: Psychiatry

## 2013-01-30 DIAGNOSIS — F430 Acute stress reaction: Secondary | ICD-10-CM

## 2013-01-30 DIAGNOSIS — F438 Other reactions to severe stress: Secondary | ICD-10-CM

## 2013-01-30 NOTE — Patient Instructions (Signed)
Discussed orally 
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
## 2013-02-03 ENCOUNTER — Other Ambulatory Visit (HOSPITAL_BASED_OUTPATIENT_CLINIC_OR_DEPARTMENT_OTHER): Payer: Medicare Other | Admitting: Lab

## 2013-02-03 ENCOUNTER — Ambulatory Visit (HOSPITAL_BASED_OUTPATIENT_CLINIC_OR_DEPARTMENT_OTHER): Payer: Medicare Other | Admitting: Pharmacist

## 2013-02-03 DIAGNOSIS — D682 Hereditary deficiency of other clotting factors: Secondary | ICD-10-CM

## 2013-02-03 DIAGNOSIS — I82629 Acute embolism and thrombosis of deep veins of unspecified upper extremity: Secondary | ICD-10-CM

## 2013-02-03 LAB — PROTIME-INR: INR: 1.7 — ABNORMAL LOW (ref 2.00–3.50)

## 2013-02-03 LAB — POCT INR: INR: 1.7

## 2013-02-03 NOTE — Progress Notes (Signed)
INR below goal of 2-3.  No changes in diet or medications.  INR has been at goal of current coumadin dose of 6mg  Tues/Thur/Sat and 7mg  other days since 09/20/12.  I have instructed Ms Reich to take Coumadin 10mg  today, then resume current coumadin dose.  Will check PT/INR in 2 weeks.  If INR still out of goal range, would consider changing Coumadin dose.

## 2013-02-05 ENCOUNTER — Other Ambulatory Visit: Payer: Self-pay | Admitting: Lab

## 2013-02-05 ENCOUNTER — Ambulatory Visit: Payer: Self-pay

## 2013-02-13 ENCOUNTER — Ambulatory Visit (INDEPENDENT_AMBULATORY_CARE_PROVIDER_SITE_OTHER): Payer: Medicare Other | Admitting: Otolaryngology

## 2013-02-13 DIAGNOSIS — D487 Neoplasm of uncertain behavior of other specified sites: Secondary | ICD-10-CM

## 2013-02-13 DIAGNOSIS — H6120 Impacted cerumen, unspecified ear: Secondary | ICD-10-CM

## 2013-02-13 DIAGNOSIS — R599 Enlarged lymph nodes, unspecified: Secondary | ICD-10-CM

## 2013-02-17 ENCOUNTER — Ambulatory Visit: Payer: Self-pay

## 2013-02-17 ENCOUNTER — Other Ambulatory Visit: Payer: Self-pay

## 2013-02-20 ENCOUNTER — Ambulatory Visit (HOSPITAL_BASED_OUTPATIENT_CLINIC_OR_DEPARTMENT_OTHER): Payer: Medicare Other | Admitting: Lab

## 2013-02-20 ENCOUNTER — Ambulatory Visit: Payer: Medicare Other | Admitting: Pharmacist

## 2013-02-20 ENCOUNTER — Ambulatory Visit (HOSPITAL_COMMUNITY): Payer: Self-pay | Admitting: Psychiatry

## 2013-02-20 ENCOUNTER — Telehealth: Payer: Self-pay | Admitting: Pharmacist

## 2013-02-20 DIAGNOSIS — D682 Hereditary deficiency of other clotting factors: Secondary | ICD-10-CM

## 2013-02-20 DIAGNOSIS — D689 Coagulation defect, unspecified: Secondary | ICD-10-CM

## 2013-02-20 DIAGNOSIS — D6851 Activated protein C resistance: Secondary | ICD-10-CM

## 2013-02-20 DIAGNOSIS — D6859 Other primary thrombophilia: Secondary | ICD-10-CM

## 2013-02-20 DIAGNOSIS — Z170 Estrogen receptor positive status [ER+]: Secondary | ICD-10-CM

## 2013-02-20 DIAGNOSIS — I82629 Acute embolism and thrombosis of deep veins of unspecified upper extremity: Secondary | ICD-10-CM

## 2013-02-20 DIAGNOSIS — C50919 Malignant neoplasm of unspecified site of unspecified female breast: Secondary | ICD-10-CM

## 2013-02-20 LAB — CBC WITH DIFFERENTIAL/PLATELET
Basophils Absolute: 0 10*3/uL (ref 0.0–0.1)
Eosinophils Absolute: 0.2 10*3/uL (ref 0.0–0.5)
HGB: 12.9 g/dL (ref 11.6–15.9)
MCV: 88.6 fL (ref 79.5–101.0)
MONO%: 4.2 % (ref 0.0–14.0)
NEUT#: 4 10*3/uL (ref 1.5–6.5)
RBC: 4.38 10*6/uL (ref 3.70–5.45)
RDW: 13.9 % (ref 11.2–14.5)
WBC: 7.4 10*3/uL (ref 3.9–10.3)
lymph#: 2.9 10*3/uL (ref 0.9–3.3)

## 2013-02-20 LAB — PROTIME-INR
INR: 1.8 — ABNORMAL LOW (ref 2.00–3.50)
Protime: 21.6 Seconds — ABNORMAL HIGH (ref 10.6–13.4)

## 2013-02-20 LAB — COMPREHENSIVE METABOLIC PANEL (CC13)
AST: 29 U/L (ref 5–34)
Alkaline Phosphatase: 100 U/L (ref 40–150)
BUN: 10 mg/dL (ref 7.0–26.0)
Glucose: 107 mg/dl — ABNORMAL HIGH (ref 70–99)
Sodium: 142 mEq/L (ref 136–145)
Total Bilirubin: 0.37 mg/dL (ref 0.20–1.20)
Total Protein: 7.6 g/dL (ref 6.4–8.3)

## 2013-02-20 LAB — POCT INR: INR: 1.8

## 2013-02-20 NOTE — Progress Notes (Signed)
INR = 1.8 on 7 mg/day; 6 mg Tu/Th/Sat CBC wnl No med changes.  No diet changes. Pt c/o L ear pain.  She has swollen lymph node on that side of her neck.  She has not called her PCP about this. Scattered minor bruises. INR slightly subtherapeutic.  She has been low now a couple of times so I will increase her dose a little up to 7 mg/day; 6 mg Tu/Sat. Repeat protime in ~2 weeks. Her court date is 03/05/13.  She is also planning a trip to the beach.  If she needs to move her appt for 03/04/13 w/ [name], she will call ahead of time. Ebony Hail, Pharm.D., CPP 02/20/2013@2 :22 PM

## 2013-02-21 ENCOUNTER — Ambulatory Visit: Payer: Self-pay | Admitting: Oncology

## 2013-02-21 ENCOUNTER — Other Ambulatory Visit: Payer: Self-pay | Admitting: Lab

## 2013-03-04 ENCOUNTER — Ambulatory Visit: Payer: Medicare Other | Admitting: Pharmacist

## 2013-03-04 ENCOUNTER — Other Ambulatory Visit (HOSPITAL_BASED_OUTPATIENT_CLINIC_OR_DEPARTMENT_OTHER): Payer: Medicare Other | Admitting: Lab

## 2013-03-04 DIAGNOSIS — D682 Hereditary deficiency of other clotting factors: Secondary | ICD-10-CM

## 2013-03-04 LAB — PROTIME-INR
INR: 2.1 (ref 2.00–3.50)
Protime: 25.2 Seconds — ABNORMAL HIGH (ref 10.6–13.4)

## 2013-03-04 NOTE — Progress Notes (Signed)
INR at goal today. Patient reports no bleeding or bruising. No missed doses. Plan is to Continue your dose of 7 mg daily except for 6 mg on Tues & Sat. Return in 3 weeks Recheck INR on 03/25/13 at 1100 for lab, 1115 for CC.

## 2013-03-04 NOTE — Patient Instructions (Addendum)
INR at goal  No changes  Continue your dose of 7 mg daily except for 6 mg on Tues & Sat. Recheck INR on 03/25/13 at 1100 for lab, 1115 for CC.

## 2013-03-06 ENCOUNTER — Ambulatory Visit (HOSPITAL_COMMUNITY): Payer: Self-pay | Admitting: Psychiatry

## 2013-03-10 ENCOUNTER — Other Ambulatory Visit: Payer: Self-pay | Admitting: *Deleted

## 2013-03-10 DIAGNOSIS — D682 Hereditary deficiency of other clotting factors: Secondary | ICD-10-CM

## 2013-03-10 MED ORDER — WARFARIN SODIUM 1 MG PO TABS: ORAL_TABLET | ORAL | Status: DC

## 2013-03-21 ENCOUNTER — Telehealth: Payer: Self-pay | Admitting: Oncology

## 2013-03-21 ENCOUNTER — Ambulatory Visit (HOSPITAL_BASED_OUTPATIENT_CLINIC_OR_DEPARTMENT_OTHER): Payer: Medicare Other | Admitting: Oncology

## 2013-03-21 ENCOUNTER — Ambulatory Visit: Payer: Medicare Other | Admitting: Pharmacist

## 2013-03-21 ENCOUNTER — Other Ambulatory Visit (HOSPITAL_BASED_OUTPATIENT_CLINIC_OR_DEPARTMENT_OTHER): Payer: Medicare Other | Admitting: Lab

## 2013-03-21 VITALS — BP 142/91 | HR 74 | Temp 97.0°F | Resp 18 | Ht 62.0 in | Wt 211.2 lb

## 2013-03-21 DIAGNOSIS — D6851 Activated protein C resistance: Secondary | ICD-10-CM

## 2013-03-21 DIAGNOSIS — I82629 Acute embolism and thrombosis of deep veins of unspecified upper extremity: Secondary | ICD-10-CM

## 2013-03-21 DIAGNOSIS — F319 Bipolar disorder, unspecified: Secondary | ICD-10-CM

## 2013-03-21 DIAGNOSIS — D682 Hereditary deficiency of other clotting factors: Secondary | ICD-10-CM

## 2013-03-21 DIAGNOSIS — D6859 Other primary thrombophilia: Secondary | ICD-10-CM

## 2013-03-21 DIAGNOSIS — I82622 Acute embolism and thrombosis of deep veins of left upper extremity: Secondary | ICD-10-CM

## 2013-03-21 DIAGNOSIS — Z7901 Long term (current) use of anticoagulants: Secondary | ICD-10-CM

## 2013-03-21 LAB — POCT INR: INR: 2.2

## 2013-03-21 NOTE — Telephone Encounter (Signed)
gv and printed appt swched and avs for pt

## 2013-03-21 NOTE — Progress Notes (Addendum)
INR at goal on coumadin 6mg  tu/sat and 7mg  other days.  No changes in medications or any reports of bleeding/bruising.  Will continue current coumadin dose and check PT/INr in 1 month.  Jessica Reilly has appt with Dr. Cyndie Chime today.  Coumadin therapy will be complete when Jessica Reilly completes Arimidex.  [facility] oncologist that tx her breast cancer is at [facility].  Jessica Reilly states that she will be continuing on Arimidex for > 5 yrs and possibly for 2-3 more years.

## 2013-03-22 ENCOUNTER — Encounter: Payer: Self-pay | Admitting: Oncology

## 2013-03-22 DIAGNOSIS — F319 Bipolar disorder, unspecified: Secondary | ICD-10-CM

## 2013-03-22 DIAGNOSIS — Z7901 Long term (current) use of anticoagulants: Secondary | ICD-10-CM

## 2013-03-22 HISTORY — DX: Bipolar disorder, unspecified: F31.9

## 2013-03-22 HISTORY — DX: Long term (current) use of anticoagulants: Z79.01

## 2013-03-22 NOTE — Progress Notes (Signed)
Hematology and Oncology Follow Up Visit  Jessica Reilly [id number] 07-Jan-1961 52 y.o. 03/22/2013 6:16 PM   Principle Diagnosis: Encounter Diagnoses  Name Primary?  . Factor V Leiden mutation Yes  . 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 She received postoperative radiation to the right chest wall, axilla, and supraclavicular area. She sustained a left upper extremity DVT following left breast surgery. She tested negative for the BRCA1 and BRCA 2 genes by her history.  She received neoadjuvant CEF chemotherapy for 5 cycles. Initial tumor measured 4.1 x 3.1 x 2.7 cm on mammogram done 06/25/2007. She had her surgery at [facility] in [place]. I was able to obtain records since her last visit. Final tumor size was 1.7 cm. Grade 3. 0 of 3 sentinel lymph nodes tested negative. She had very poor tolerance to the treatments. She was then put on hormonal therapy with Arimidex.   Due to the perioperative left upper extremity DVT, lab testing was done and she was found to be a heterozygote for the factor V Leiden gene mutation. She was anticoagulated and she has been kept on therapeutic Coumadin.  Although the risk of thrombosis on an aromatase inhibitor is less then on tamoxifen, given her history, I felt it was reasonable to continue the Coumadin until she completes a planned 5 year course of Arimidex in September 2014. She has elected to have her Coumadin monitored through our office.   She had a recent followup with her oncologist at [facility]. Dr. Nancie Neas. It was her clinical judgment that the Arimidex hormonal therapy should be continued past 5 years. I discussed using alternative  anticoagulants with the patient. There is some recent data suggesting excellent thromboprophylaxis with low dose apixiban. The patient felt uncomfortable making this change and prefers to stay on Coumadin. Current dose 7 mg 5 days of the week and 6 mg 2 days of the week.   Medications: reviewed  Allergies:  Allergies  Allergen Reactions  . Cefuroxime Axetil Other (See Comments)    unknown  . Ceftin Rash  . Risperidone And Related Other (See Comments)    Has medical contraindications    Review of Systems: Constitutional:   No constitutional symptoms Respiratory: No cough or dyspnea Cardiovascular:  No chest pain or palpitations Gastrointestinal: No change in bowel habit Genito-Urinary: No vaginal bleeding Musculoskeletal: No joint pain. No bone pain. Neurologic: No headache or change in vision Skin: No rash or ecchymosis Remaining ROS negative.  Physical Exam: Blood pressure 142/91, pulse 74, temperature 97 F (36.1 C), temperature source Oral, resp. rate 18, height 5\' 2"  (1.575 m), weight 211 lb 3.2 oz (95.8 kg). Wt Readings from Last 3 Encounters:  03/21/13 211 lb 3.2 oz (95.8 kg)  01/13/13 220 lb (99.791 kg)  01/03/13 212 lb (96.163 kg)     General appearance: Well-nourished Caucasian woman HENNT: Pharynx no erythema or exudate Lymph nodes: No cervical supraclavicular or axillary adenopathy Breasts: Lungs: Clear to auscultation resonant to percussion Heart: Regular rhythm no murmur Abdomen: Soft, obese, nontender, no mass, no organomegaly Extremities: No edema, no calf tenderness Musculoskeletal: No joint deformities GU: Vascular: No cyanosis Neurologic: Motor strength 5 over 5, reflexes 1+ symmetric Skin: No  rash or ecchymosis  Lab Results: Lab Results  Component Value Date   WBC 7.4 02/20/2013   HGB 12.9 02/20/2013   HCT 38.8 02/20/2013   MCV 88.6 02/20/2013   PLT 275 02/20/2013     Chemistry      Component Value Date/Time   NA 142 02/20/2013 1342   NA 139  01/03/2013 2217   K 4.4 02/20/2013 1342   K 4.0 01/03/2013 2217   CL 105 02/20/2013 1342   CL 102 01/03/2013 2217   CO2 26 02/20/2013 1342   CO2 24 01/03/2013 2217   BUN 10.0 02/20/2013 1342   BUN 15 01/03/2013 2217   CREATININE 1.2* 02/20/2013 1342   CREATININE 1.07 01/03/2013 2217   CREATININE 1.05 12/31/2012 1044      Component Value Date/Time   CALCIUM 9.5 02/20/2013 1342   CALCIUM 9.8 01/03/2013 2217   ALKPHOS 100 02/20/2013 1342   ALKPHOS 93 01/03/2013 2217   AST 29 02/20/2013 1342   AST 36 01/03/2013 2217   ALT 33 02/20/2013 1342   ALT 31 01/03/2013 2217   BILITOT 0.37 02/20/2013 1342   BILITOT 0.3 01/03/2013 2217     .  Impression:  Plan: #1. 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 Status post isolated episode of upper extremity thrombosis following elective, left, simple mastectomy. Ongoing hormonal therapy with Arimidex which has a low thrombogenic potential. I tried to review with the patient again today but heterozygote status for the 5 Leiden gene mutation is a minor risk factor for clotting. However, as noted above, she would prefer to stay on Coumadin and not use alternative anticoagulants or aspirin for ongoing thromboprophylaxis. We will continue to monitor her Coumadin locally.  #2. T2, N0, M0 stage II, ER also did, HER-2 negative, grade 3, cancer of the right breast. Treated as outlined above. She remains free of any obvious new disease now out almost 6 years from initial diagnosis. She recent Pap smear through primary care on 12/31/2012.  #3. Bipolar disorder Recommendation of psychiatrist who recently evaluated her was to go on Lexapro but I do not see this listed on her active medication list. She is also been on Haldol, respiratory, Xanax, Depakote, and Seroquel in the past.   CC:. Dr. Nancie Neas; Dr. Orson Aloe; Cyril Mourning, nurse practitioner   Levert Feinstein, MD 6/14/20146:16 PM

## 2013-03-25 ENCOUNTER — Other Ambulatory Visit: Payer: Self-pay | Admitting: Lab

## 2013-03-25 ENCOUNTER — Ambulatory Visit: Payer: Self-pay

## 2013-04-16 ENCOUNTER — Other Ambulatory Visit (HOSPITAL_BASED_OUTPATIENT_CLINIC_OR_DEPARTMENT_OTHER): Payer: Medicare Other | Admitting: Lab

## 2013-04-16 ENCOUNTER — Ambulatory Visit (HOSPITAL_BASED_OUTPATIENT_CLINIC_OR_DEPARTMENT_OTHER): Payer: Medicare Other | Admitting: Pharmacist

## 2013-04-16 ENCOUNTER — Ambulatory Visit: Payer: Medicare Other | Admitting: Lab

## 2013-04-16 DIAGNOSIS — D682 Hereditary deficiency of other clotting factors: Secondary | ICD-10-CM

## 2013-04-16 LAB — PROTIME-INR
INR: 2.4 (ref 2.00–3.50)
Protime: 28.8 Seconds — ABNORMAL HIGH (ref 10.6–13.4)

## 2013-04-16 LAB — POCT INR: INR: 2.4

## 2013-04-16 NOTE — Progress Notes (Signed)
INR remains at goal on 7mg  daily 6mg  2 times/week.  No changes in medications.  Jessica Jessica Reilly has no other changes to report.  Plan was to continue anticoagulation while on arimidex.  Jessica Reilly will be continuing Arimidex for another 2-3 yrs per MD at [facility].

## 2013-04-29 ENCOUNTER — Ambulatory Visit (INDEPENDENT_AMBULATORY_CARE_PROVIDER_SITE_OTHER): Payer: Medicare Other | Admitting: Psychiatry

## 2013-04-29 DIAGNOSIS — F319 Bipolar disorder, unspecified: Secondary | ICD-10-CM

## 2013-04-29 NOTE — Patient Instructions (Signed)
Discussed orally 
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
## 2013-05-05 ENCOUNTER — Telehealth: Payer: Self-pay | Admitting: Adult Health

## 2013-05-05 NOTE — Telephone Encounter (Signed)
She had questions about her brother who has hept c and if she could get it from him, her questions were answered.

## 2013-05-14 ENCOUNTER — Encounter (INDEPENDENT_AMBULATORY_CARE_PROVIDER_SITE_OTHER): Payer: Self-pay | Admitting: *Deleted

## 2013-05-14 ENCOUNTER — Ambulatory Visit (INDEPENDENT_AMBULATORY_CARE_PROVIDER_SITE_OTHER): Payer: Medicare Other | Admitting: Psychiatry

## 2013-05-14 DIAGNOSIS — F411 Generalized anxiety disorder: Secondary | ICD-10-CM

## 2013-05-14 DIAGNOSIS — F319 Bipolar disorder, unspecified: Secondary | ICD-10-CM

## 2013-05-14 NOTE — Patient Instructions (Signed)
Discussed orally 
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
## 2013-05-15 ENCOUNTER — Other Ambulatory Visit (HOSPITAL_BASED_OUTPATIENT_CLINIC_OR_DEPARTMENT_OTHER): Payer: Medicare Other | Admitting: Lab

## 2013-05-15 ENCOUNTER — Ambulatory Visit (HOSPITAL_BASED_OUTPATIENT_CLINIC_OR_DEPARTMENT_OTHER): Payer: Medicare Other | Admitting: Pharmacist

## 2013-05-15 DIAGNOSIS — D6859 Other primary thrombophilia: Secondary | ICD-10-CM

## 2013-05-15 DIAGNOSIS — D6851 Activated protein C resistance: Secondary | ICD-10-CM

## 2013-05-15 DIAGNOSIS — I82629 Acute embolism and thrombosis of deep veins of unspecified upper extremity: Secondary | ICD-10-CM

## 2013-05-15 DIAGNOSIS — D682 Hereditary deficiency of other clotting factors: Secondary | ICD-10-CM

## 2013-05-15 DIAGNOSIS — I82622 Acute embolism and thrombosis of deep veins of left upper extremity: Secondary | ICD-10-CM

## 2013-05-15 LAB — PROTIME-INR: Protime: 30 Seconds — ABNORMAL HIGH (ref 10.6–13.4)

## 2013-05-15 MED ORDER — WARFARIN SODIUM 1 MG PO TABS: 1.0000 mg | ORAL_TABLET | Freq: Every day | ORAL | Status: DC

## 2013-05-15 MED ORDER — WARFARIN SODIUM 5 MG PO TABS: 5.0000 mg | ORAL_TABLET | Freq: Every day | ORAL | Status: DC

## 2013-05-15 NOTE — Progress Notes (Signed)
Pt seen today in clinic. INR=2.5 on 7mg  daily with 6mg  on Tue and Sat. Although patient states she missed two doses since last visit, she remains therapeutic. No other changes to report. Continue your dose of 7 mg daily except for 6 mg on Tues & Sat. Recheck INR on 06/18/13 at 1100 for lab, 1115 for CC.

## 2013-05-15 NOTE — Patient Instructions (Addendum)
Continue your dose of 7 mg daily except for 6 mg on Tues & Sat. Recheck INR on 06/18/13 at 1100 for lab, 1115 for CC.

## 2013-05-29 ENCOUNTER — Ambulatory Visit (INDEPENDENT_AMBULATORY_CARE_PROVIDER_SITE_OTHER): Payer: Medicare Other | Admitting: Psychiatry

## 2013-05-29 DIAGNOSIS — F411 Generalized anxiety disorder: Secondary | ICD-10-CM

## 2013-05-29 DIAGNOSIS — F319 Bipolar disorder, unspecified: Secondary | ICD-10-CM
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
## 2013-05-29 NOTE — Patient Instructions (Signed)
Discussed orally 

## 2013-06-03 ENCOUNTER — Ambulatory Visit (HOSPITAL_COMMUNITY): Payer: Self-pay | Admitting: Psychiatry

## 2013-06-06 ENCOUNTER — Encounter (INDEPENDENT_AMBULATORY_CARE_PROVIDER_SITE_OTHER): Payer: Self-pay | Admitting: *Deleted

## 2013-06-11 ENCOUNTER — Ambulatory Visit (HOSPITAL_COMMUNITY): Payer: Self-pay | Admitting: Psychiatry

## 2013-06-16 ENCOUNTER — Other Ambulatory Visit: Payer: Self-pay | Admitting: Pharmacist

## 2013-06-16 DIAGNOSIS — I82622 Acute embolism and thrombosis of deep veins of left upper extremity: Secondary | ICD-10-CM

## 2013-06-16 DIAGNOSIS — D6851 Activated protein C resistance: Secondary | ICD-10-CM

## 2013-06-16 MED ORDER — WARFARIN SODIUM 5 MG PO TABS: 5.0000 mg | ORAL_TABLET | Freq: Every day | ORAL | Status: DC

## 2013-06-16 MED ORDER — WARFARIN SODIUM 1 MG PO TABS: 1.0000 mg | ORAL_TABLET | Freq: Every day | ORAL | Status: DC

## 2013-06-18 ENCOUNTER — Other Ambulatory Visit: Payer: Self-pay | Admitting: Lab

## 2013-06-18 ENCOUNTER — Ambulatory Visit: Payer: Self-pay

## 2013-06-23 ENCOUNTER — Ambulatory Visit (INDEPENDENT_AMBULATORY_CARE_PROVIDER_SITE_OTHER): Payer: Medicare Other | Admitting: Psychiatry

## 2013-06-23 DIAGNOSIS — F319 Bipolar disorder, unspecified: Secondary | ICD-10-CM

## 2013-06-23 DIAGNOSIS — F411 Generalized anxiety disorder: Secondary | ICD-10-CM
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
## 2013-06-23 NOTE — Patient Instructions (Signed)
Discussed orally 

## 2013-06-25 ENCOUNTER — Ambulatory Visit (INDEPENDENT_AMBULATORY_CARE_PROVIDER_SITE_OTHER): Payer: Medicare Other | Admitting: Psychiatry

## 2013-06-25 ENCOUNTER — Encounter (INDEPENDENT_AMBULATORY_CARE_PROVIDER_SITE_OTHER): Payer: Self-pay | Admitting: Internal Medicine

## 2013-06-25 ENCOUNTER — Telehealth (INDEPENDENT_AMBULATORY_CARE_PROVIDER_SITE_OTHER): Payer: Self-pay | Admitting: *Deleted

## 2013-06-25 ENCOUNTER — Encounter (HOSPITAL_COMMUNITY): Payer: Self-pay | Admitting: Psychiatry

## 2013-06-25 ENCOUNTER — Other Ambulatory Visit (INDEPENDENT_AMBULATORY_CARE_PROVIDER_SITE_OTHER): Payer: Self-pay | Admitting: *Deleted

## 2013-06-25 ENCOUNTER — Ambulatory Visit (INDEPENDENT_AMBULATORY_CARE_PROVIDER_SITE_OTHER): Payer: Medicare Other | Admitting: Internal Medicine

## 2013-06-25 VITALS — BP 150/100 | Ht 62.0 in | Wt 211.0 lb

## 2013-06-25 VITALS — BP 136/84 | HR 64 | Temp 98.0°F | Ht 62.0 in | Wt 210.8 lb

## 2013-06-25 DIAGNOSIS — Z860100 Personal history of colon polyps, unspecified: Secondary | ICD-10-CM | POA: Insufficient documentation

## 2013-06-25 DIAGNOSIS — K219 Gastro-esophageal reflux disease without esophagitis: Secondary | ICD-10-CM

## 2013-06-25 DIAGNOSIS — K317 Polyp of stomach and duodenum: Secondary | ICD-10-CM | POA: Insufficient documentation

## 2013-06-25 DIAGNOSIS — Z1211 Encounter for screening for malignant neoplasm of colon: Secondary | ICD-10-CM

## 2013-06-25 DIAGNOSIS — F429 Obsessive-compulsive disorder, unspecified: Secondary | ICD-10-CM

## 2013-06-25 DIAGNOSIS — F319 Bipolar disorder, unspecified: Secondary | ICD-10-CM

## 2013-06-25 DIAGNOSIS — D131 Benign neoplasm of stomach: Secondary | ICD-10-CM

## 2013-06-25 MED ORDER — BENZTROPINE MESYLATE 1 MG PO TABS: 1.0000 mg | ORAL_TABLET | Freq: Every day | ORAL | Status: DC

## 2013-06-25 MED ORDER — PEG-KCL-NACL-NASULF-NA ASC-C 100 G PO SOLR: 1.0000 | Freq: Once | ORAL | Status: DC

## 2013-06-25 MED ORDER — ESCITALOPRAM OXALATE 10 MG PO TABS: 10.0000 mg | ORAL_TABLET | Freq: Every day | ORAL | Status: DC

## 2013-06-25 MED ORDER — HALOPERIDOL 2 MG PO TABS: 2.0000 mg | ORAL_TABLET | Freq: Every day | ORAL | Status: DC
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
## 2013-06-25 NOTE — Patient Instructions (Addendum)
EGD/Colonoscopy

## 2013-06-25 NOTE — Progress Notes (Signed)
Subjective:     Patient ID: Jessica Reilly, female   DOB: 08-12-1961, 52 y.o.   MRN: [id number]  HPI 52 yr old female here today for a scheduled visit. She tells me she is doing okay. Her BMs move about every other day. If she goes 3 days without a BM,she will use a stool softner. She denies any rectal bleeding.  There is no abdominal pain. She c/o frequent acid reflux. She tells me sometimes it bubbles out her mouth and nose. She also has chest pain.  It occurs frequently. She is requesting an EGD today. EGD at [facility] in 2009 for nausea: Desquamated esophageal mucosa. Gastric polyps biopsied. Successful placement of feeding tube past the pylorus. Appetite is good. There has been no weight loss. She has hx of colon polyps. She is due for a colonoscipy.  Hx of Breast cancer which has been almost 5 yrs. She is followed at [facility]. Dr. Cyndie Chime follow her INR Her last colonoscopy was in 2009.  Normal terminal ileum. Three small polyps. Healing sterocoral ulcers. Anal papilla. Biopsy Tubular adenoma. Sigmoid: Colonic epithelium with focal hyperplastic change.    Marland KitchenHx of DVT: last one in 2013. Presently taking Coumadin  Review of Systems Current Outpatient Prescriptions  Medication Sig Dispense Refill  . alprazolam (XANAX) 2 MG tablet Take 2 mg by mouth at bedtime as needed. For anxiety      . anastrozole (ARIMIDEX) 1 MG tablet Take 1 mg by mouth daily.        . benztropine (COGENTIN) 1 MG tablet Take 1 tablet (1 mg total) by mouth at bedtime.  30 tablet  2  . Cholecalciferol (VITAMIN D) 1000 UNITS capsule Take 1,000 Units by mouth daily.      Marland Kitchen docusate sodium (STOOL SOFTENER) 100 MG capsule Take 2 capsules (200 mg total) by mouth at bedtime.  10 capsule  0  . escitalopram (LEXAPRO) 10 MG tablet Take 1 tablet (10 mg total) by mouth daily.  30 tablet  2  . esomeprazole (NEXIUM) 40 MG packet Take 40 mg by mouth 2 (two) times daily. Patient takes twice a day      . fish oil-omega-3 fatty acids  1000 MG capsule Take 1 g by mouth 2 (two) times daily.      . fluticasone (FLONASE) 50 MCG/ACT nasal spray Place 2 sprays into the nose daily. Takes 2 sprays into each nostril at bedtime      . haloperidol (HALDOL) 2 MG tablet Take 1 tablet (2 mg total) by mouth at bedtime.  30 tablet  2  . HYDROcodone-acetaminophen (NORCO/VICODIN) 5-325 MG per tablet Take 1 tablet by mouth every 4 (four) hours as needed for pain.  15 tablet  0  . levocetirizine (XYZAL) 5 MG tablet       . lidocaine (LIDODERM) 5 % Place 1 patch onto the skin daily. Remove & Discard patch within 12 hours or as directed by MD (only uses prn)      . metoprolol (TOPROL-XL) 100 MG 24 hr tablet Take 100 mg by mouth daily.        . promethazine (PHENERGAN) 25 MG tablet Take 25 mg by mouth every 6 (six) hours as needed.      . simvastatin (ZOCOR) 20 MG tablet Take 20 mg by mouth daily.       Marland Kitchen warfarin (COUMADIN) 1 MG tablet Take 1 tablet (1 mg total) by mouth daily. Take as directed-7mg  daily except 6mg  on Tues&Sat.  60 tablet  3  . warfarin (COUMADIN) 5 MG tablet Take 1 tablet (5 mg total) by mouth daily. Take as directed: 7mg  daily except 6 mg on Tues&Sat.  30 tablet  3   No current facility-administered medications for this visit.   Past Medical History  Diagnosis Date  . HTN (hypertension)   . Acid reflux   . High cholesterol   . HX: breast cancer   . Gallstones   . Kidney stones   . Fibroid   . Heart murmur   . Osteoarthritis   . Factor V Leiden   . Coagulopathy 04/10/2012  . Factor V Leiden mutation 04/10/2012  . DVT of upper extremity (deep vein thrombosis) 04/10/2012  . Lobular carcinoma of breast, estrogen receptor positive, 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  . Abnormal Pap smear   . Mental disorder     panic attacks  . Constipation   . Pain in both knees   . Hypertension 12/31/2012  . Chronic anticoagulation 03/22/2013  . Bipolar disorder 03/22/2013   Past Surgical History  Procedure Laterality Date  . Mastectomy  bilateral  .  Breast lumpectomy    . Ovaries removed    . Tonsillectomy and adenoidectomy    . Lymph node biopsy     Allergies  Allergen Reactions  . Cefuroxime Axetil Other (See Comments)    unknown  . Ceftin Rash  . Risperidone And Related Other (See Comments)    Has medical contraindications       Objective:   Physical Exam  Filed Vitals:   06/25/13 1025  BP: 136/84  Pulse: 64  Temp: 98 F (36.7 C)  Height: 5\' 2"  (1.575 m)  Weight: 210 lb 12.8 oz (95.618 kg)   Alert and oriented. Skin warm and dry. Oral mucosa is moist.   . Sclera anicteric, conjunctivae is pink. Thyroid not enlarged. No cervical lymphadenopathy. Lungs clear. Heart regular rate and rhythm.  Abdomen is soft. Bowel sounds are positive. No hepatomegaly. No abdominal masses felt. No tenderness.  No edema to lower extremities.      Assessment:    Hx of colonic polyps. Overdue for surveillance colonoscopy.  GERD, Hx of gastric polyps. Patient is requesting an EGD.    Plan:    Surveillance colonoscopy for hx of tubular adenoma.    EGD

## 2013-06-25 NOTE — Telephone Encounter (Signed)
Patient needs movi prep 

## 2013-06-26 ENCOUNTER — Ambulatory Visit (INDEPENDENT_AMBULATORY_CARE_PROVIDER_SITE_OTHER): Payer: Medicare Other | Admitting: Psychiatry

## 2013-06-26 DIAGNOSIS — F411 Generalized anxiety disorder: Secondary | ICD-10-CM

## 2013-06-26 DIAGNOSIS — F319 Bipolar disorder, unspecified: Secondary | ICD-10-CM

## 2013-06-27 ENCOUNTER — Ambulatory Visit (HOSPITAL_COMMUNITY): Payer: Self-pay | Admitting: Psychiatry

## 2013-06-27 NOTE — Patient Instructions (Signed)
Discussed orally 
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
## 2013-06-30 ENCOUNTER — Ambulatory Visit (HOSPITAL_BASED_OUTPATIENT_CLINIC_OR_DEPARTMENT_OTHER): Payer: Medicare Other | Admitting: Pharmacist

## 2013-06-30 ENCOUNTER — Other Ambulatory Visit (HOSPITAL_BASED_OUTPATIENT_CLINIC_OR_DEPARTMENT_OTHER): Payer: Medicare Other | Admitting: Lab

## 2013-06-30 DIAGNOSIS — I824Z9 Acute embolism and thrombosis of unspecified deep veins of unspecified distal lower extremity: Secondary | ICD-10-CM

## 2013-06-30 DIAGNOSIS — D6859 Other primary thrombophilia: Secondary | ICD-10-CM

## 2013-06-30 DIAGNOSIS — I82629 Acute embolism and thrombosis of deep veins of unspecified upper extremity: Secondary | ICD-10-CM

## 2013-06-30 DIAGNOSIS — D682 Hereditary deficiency of other clotting factors: Secondary | ICD-10-CM

## 2013-06-30 LAB — PROTIME-INR
INR: 2.4 (ref 2.00–3.50)
Protime: 28.8 Seconds — ABNORMAL HIGH (ref 10.6–13.4)

## 2013-06-30 NOTE — Progress Notes (Signed)
INR at goal. Pt reports no missed doses or extra doses. No other changes to report. No issues with bleeding or bruising. Pt does have a procedure scheduled next month on 07/23/13 for Colonscopy with esophagogastroduodenoscopy (EGD). Dr. Patty Sermons office called requesting to hold coumadin 5 days prior to procedure. Per discussion with Dr. Cyndie Chime, he would prefer only holding coumadin 3 days prior to procedure. Patient can then restart coumadin the evening of the procedure (07/23/13) as long as patient has no complications. We will then see her back the week after in the coumadin clinic to assess.  Plan: Continue dose of 7 mg daily except for 6 mg on Tues & Sat. Hold coumadin for procedure starting Sunday 07/20/13 for procedure on 10/15 (3 days off therapy). Restart coumadin the same evening of procedure.  Recheck INR on 07/30/13 at 1100 for lab, 1115 for CC.   Will contact Dr. Patty Sermons office with updated plan

## 2013-06-30 NOTE — Patient Instructions (Addendum)
INR at goal Continue your dose of 7 mg daily except for 6 mg on Tues & Sat. Hold coumadin for procedure starting Sunday 07/20/13 for procedure on 10/15. Restart coumadin the same evening of procedure.  Recheck INR on 07/30/13 at 1100 for lab, 1115 for CC.

## 2013-07-03 ENCOUNTER — Other Ambulatory Visit (HOSPITAL_COMMUNITY): Payer: Self-pay | Admitting: Family Medicine

## 2013-07-03 ENCOUNTER — Ambulatory Visit (HOSPITAL_COMMUNITY)
Admission: RE | Admit: 2013-07-03 | Discharge: 2013-07-03 | Disposition: A | Discharge status: Home / Self Care | Payer: Medicare Other | Source: Ambulatory Visit | Attending: Family Medicine | Admitting: Family Medicine

## 2013-07-03 DIAGNOSIS — N200 Calculus of kidney: Secondary | ICD-10-CM | POA: Insufficient documentation

## 2013-07-03 DIAGNOSIS — M545 Low back pain, unspecified: Secondary | ICD-10-CM | POA: Insufficient documentation

## 2013-07-03 DIAGNOSIS — IMO0002 Reserved for concepts with insufficient information to code with codable children: Secondary | ICD-10-CM | POA: Insufficient documentation

## 2013-07-04 ENCOUNTER — Ambulatory Visit (HOSPITAL_COMMUNITY): Payer: Self-pay | Admitting: Psychiatry

## 2013-07-04 ENCOUNTER — Ambulatory Visit (INDEPENDENT_AMBULATORY_CARE_PROVIDER_SITE_OTHER): Payer: Medicare Other | Admitting: Psychiatry

## 2013-07-04 DIAGNOSIS — F319 Bipolar disorder, unspecified: Secondary | ICD-10-CM

## 2013-07-04 DIAGNOSIS — F411 Generalized anxiety disorder: Secondary | ICD-10-CM

## 2013-07-04 NOTE — Patient Instructions (Signed)
Discussed orally 
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
## 2013-07-07 ENCOUNTER — Ambulatory Visit (HOSPITAL_COMMUNITY): Payer: Self-pay | Admitting: Psychiatry

## 2013-07-08 ENCOUNTER — Encounter (HOSPITAL_COMMUNITY): Payer: Self-pay | Admitting: Psychiatry

## 2013-07-08 ENCOUNTER — Ambulatory Visit (INDEPENDENT_AMBULATORY_CARE_PROVIDER_SITE_OTHER): Payer: No Typology Code available for payment source | Admitting: Psychiatry

## 2013-07-08 VITALS — BP 130/90 | Ht 62.0 in | Wt 208.0 lb

## 2013-07-08 DIAGNOSIS — F319 Bipolar disorder, unspecified: Secondary | ICD-10-CM

## 2013-07-08 DIAGNOSIS — F429 Obsessive-compulsive disorder, unspecified: Secondary | ICD-10-CM

## 2013-07-08 MED ORDER — ESCITALOPRAM OXALATE 20 MG PO TABS: 20.0000 mg | ORAL_TABLET | Freq: Every day | ORAL | Status: DC
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
## 2013-07-10 ENCOUNTER — Ambulatory Visit (INDEPENDENT_AMBULATORY_CARE_PROVIDER_SITE_OTHER): Payer: Medicare Other | Admitting: Psychiatry

## 2013-07-10 ENCOUNTER — Encounter (HOSPITAL_COMMUNITY): Payer: Self-pay | Admitting: Pharmacy Technician

## 2013-07-10 DIAGNOSIS — F319 Bipolar disorder, unspecified: Secondary | ICD-10-CM
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
## 2013-07-18 ENCOUNTER — Ambulatory Visit (HOSPITAL_COMMUNITY): Payer: Self-pay | Admitting: Psychiatry

## 2013-07-23 ENCOUNTER — Ambulatory Visit (HOSPITAL_COMMUNITY)
Admission: RE | Admit: 2013-07-23 | Discharge: 2013-07-23 | Disposition: A | Discharge status: Home / Self Care | Payer: Medicare Other | Source: Ambulatory Visit | Attending: Internal Medicine | Admitting: Internal Medicine

## 2013-07-23 ENCOUNTER — Ambulatory Visit (HOSPITAL_COMMUNITY): Payer: Self-pay | Admitting: Psychiatry

## 2013-07-23 ENCOUNTER — Encounter (HOSPITAL_COMMUNITY)
Admission: RE | Disposition: A | Discharge status: Home / Self Care | Payer: Self-pay | Source: Ambulatory Visit | Attending: Internal Medicine

## 2013-07-23 ENCOUNTER — Encounter (HOSPITAL_COMMUNITY): Payer: Self-pay

## 2013-07-23 DIAGNOSIS — Z860100 Personal history of colon polyps, unspecified: Secondary | ICD-10-CM

## 2013-07-23 DIAGNOSIS — R110 Nausea: Secondary | ICD-10-CM | POA: Insufficient documentation

## 2013-07-23 DIAGNOSIS — D131 Benign neoplasm of stomach: Secondary | ICD-10-CM

## 2013-07-23 DIAGNOSIS — K219 Gastro-esophageal reflux disease without esophagitis: Secondary | ICD-10-CM

## 2013-07-23 DIAGNOSIS — K644 Residual hemorrhoidal skin tags: Secondary | ICD-10-CM | POA: Insufficient documentation

## 2013-07-23 DIAGNOSIS — K6389 Other specified diseases of intestine: Secondary | ICD-10-CM

## 2013-07-23 DIAGNOSIS — K317 Polyp of stomach and duodenum: Secondary | ICD-10-CM

## 2013-07-23 DIAGNOSIS — I10 Essential (primary) hypertension: Secondary | ICD-10-CM | POA: Insufficient documentation

## 2013-07-23 HISTORY — PX: COLONOSCOPY WITH ESOPHAGOGASTRODUODENOSCOPY (EGD): SHX5779

## 2013-07-23 SURGERY — COLONOSCOPY WITH ESOPHAGOGASTRODUODENOSCOPY (EGD)
Anesthesia: Moderate Sedation

## 2013-07-23 MED ORDER — SODIUM CHLORIDE 0.9 % IJ SOLN
INTRAMUSCULAR | Status: AC
  Filled 2013-07-23: qty 10

## 2013-07-23 MED ORDER — MEPERIDINE HCL 50 MG/ML IJ SOLN
INTRAMUSCULAR | Status: DC | PRN
  Administered 2013-07-23 (×2): 25 mg via INTRAVENOUS

## 2013-07-23 MED ORDER — AZITHROMYCIN 250 MG PO TABS: ORAL_TABLET | ORAL | Status: DC

## 2013-07-23 MED ORDER — STERILE WATER FOR IRRIGATION IR SOLN
Status: DC | PRN
  Administered 2013-07-23: 14:00:00

## 2013-07-23 MED ORDER — PROMETHAZINE HCL 25 MG/ML IJ SOLN
INTRAMUSCULAR | Status: AC
  Filled 2013-07-23: qty 1

## 2013-07-23 MED ORDER — MIDAZOLAM HCL 5 MG/5ML IJ SOLN
INTRAMUSCULAR | Status: AC
  Filled 2013-07-23: qty 10

## 2013-07-23 MED ORDER — PROMETHAZINE HCL 25 MG/ML IJ SOLN
25.0000 mg | Freq: Four times a day (QID) | INTRAMUSCULAR | Status: DC | PRN
  Administered 2013-07-23: 25 mg via INTRAVENOUS

## 2013-07-23 MED ORDER — PROMETHAZINE HCL 25 MG/ML IJ SOLN
INTRAMUSCULAR | Status: AC
Start: 2013-07-23 — End: 2013-07-23
  Filled 2013-07-23: qty 1

## 2013-07-23 MED ORDER — MIDAZOLAM HCL 5 MG/5ML IJ SOLN
INTRAMUSCULAR | Status: DC | PRN
  Administered 2013-07-23 (×2): 3 mg via INTRAVENOUS
  Administered 2013-07-23: 2 mg via INTRAVENOUS
  Administered 2013-07-23: 3 mg via INTRAVENOUS
  Administered 2013-07-23: 2 mg via INTRAVENOUS
  Administered 2013-07-23: 3 mg via INTRAVENOUS
  Administered 2013-07-23: 4 mg via INTRAVENOUS

## 2013-07-23 MED ORDER — MEPERIDINE HCL 50 MG/ML IJ SOLN
INTRAMUSCULAR | Status: AC
  Filled 2013-07-23: qty 1

## 2013-07-23 MED ORDER — SODIUM CHLORIDE 0.9 % IV SOLN
INTRAVENOUS | Status: DC
  Administered 2013-07-23: 13:00:00 via INTRAVENOUS

## 2013-07-23 MED ORDER — PROMETHAZINE HCL 25 MG/ML IJ SOLN
INTRAMUSCULAR | Status: DC | PRN
  Administered 2013-07-23 (×2): 12.5 mg via INTRAVENOUS

## 2013-07-23 NOTE — Op Note (Signed)
EGD PROCEDURE REPORT  PATIENT:  Jessica Reilly  MR#:  [id number] Birthdate:  1961-07-23, 52 y.o., female Endoscopist:  Dr. Malissa Hippo, MD Referred By:  Dr. Colette Ribas, MD Procedure Date: 07/23/2013  Procedure:   EGD & Colonoscopy  Indications:  Patient is 52 year old Caucasian female who was chronic GERD and complains of recurrent nausea. She also has history of gastric polyps found on EGD at [facility] 5 years ago. She is undergoing diagnostic EGD followed by surveillance colonoscopy because of history of colonic adenomas.            Informed Consent:  The risks, benefits, alternatives & imponderables which include, but are not limited to, bleeding, infection, perforation, drug reaction and potential missed lesion have been reviewed.  The potential for biopsy, lesion removal, esophageal dilation, etc. have also been discussed.  Questions have been answered.  All parties agreeable.  Please see history & physical in medical record for more information.  Medications:  Demerol 50 mg IV Versed 20 mg IV Promethazine 50 mg IV and diluted form; half of this dose was given and preop area for nausea. Cetacaine spray topically for oropharyngeal anesthesia  EGD  Description of procedure:  The endoscope was introduced through the mouth and advanced to the second portion of the duodenum without difficulty or limitations. The mucosal surfaces were surveyed very carefully during advancement of the scope and upon withdrawal.  Findings:  Esophagus:  Normal mucosa of the esophagus. GE junction was unremarkable. GEJ:  38 cm Stomach:  Stomach was empty and distended very well with insufflation. Multiple polyps were noted at gastric body and fundus ranging in size from 4-12 mm. Some of these polyps had erythematous discoloration. None of these polyps were ulcerated or bleeding. Biopsy was taken from 4 of these polyps and submitted together. Antral mucosa was unremarkable. Pyloric channel was patent.  Angularis and cardia were unremarkable. Duodenum:  Normal bulbar and post bulbar mucosa.  Therapeutic/Diagnostic Maneuvers Performed:  See above.  COLONOSCOPY Description of procedure:  After a digital rectal exam was performed, that colonoscope was advanced from the anus through the rectum and colon to the area of the cecum, ileocecal valve and appendiceal orifice. The cecum was deeply intubated. These structures were well-seen and photographed for the record. From the level of the cecum and ileocecal valve, the scope was slowly and cautiously withdrawn. The mucosal surfaces were carefully surveyed utilizing scope tip to flexion to facilitate fold flattening as needed. The scope was pulled down into the rectum where a thorough exam including retroflexion was performed.  Findings:   Prep satisfactory. Normal mucosa of the colon and rectum. Small hemorrhoids below the dentate line and two anal papillae noted.  Therapeutic/Diagnostic Maneuvers Performed:  None  Complications:  None  Cecal Withdrawal Time:  10 minutes  Impression:  EGD findings; No evidence of erosive esophagitis or peptic ulcer disease. Multiple polyps at gastric body and fundus suspicious for hyperplastic polyps. Four of these were biopsied for histology. Colonoscopy findings; Normal colonoscopy except small external hemorrhoids and two anal papillae.  Recommendations:  Standard instructions given. Patient was given prescription for Z-Pak for sinusitis. Patient will resume warfarin but at a reduced dose while on Z-Pak. She will need to get INR checked in 10-14 days. I will be contacting patient with biopsy results and further recommendations.  Vayda Dungee U  07/23/2013 2:35 PM  CC: Dr. Phillips Odor, Chancy Hurter, MD & Dr. Bonnetta Barry ref. provider found

## 2013-07-23 NOTE — H&P (Signed)
Jessica Reilly is an 52 y.o. female.   Chief Complaint: Patient's here for EGD and colonoscopy. HPI: Patient is 52 year old Caucasian female who presents with recurrent nausea. She has chronic GERD and her heartburn is well controlled with double dose Nexium. Diffuse misses a dose she gets severe chest pain and regurgitation. She denies vomiting or dysphagia. She has history of colonic adenomas and undergoing civilians EGD. Her last colonoscopy was in 2009. She has history of breast CA and remains in remission. 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 Warfarin was discontinued 3 days ago after consultation with Dr. Cyndie Chime, patient's oncologist.  Past Medical History  Diagnosis Date  . HTN (hypertension)   . Acid reflux   . High cholesterol   . HX: breast cancer   . Gallstones   . Kidney stones   . Fibroid   . Heart murmur   . Osteoarthritis   . Factor V Leiden   . Coagulopathy 04/10/2012  . Factor V Leiden mutation 04/10/2012  . DVT of upper extremity (deep vein thrombosis) 04/10/2012  . Lobular carcinoma of breast, estrogen receptor positive, 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  . Abnormal Pap smear   . Mental disorder     panic attacks  . Constipation   . Pain in both knees   . Hypertension 12/31/2012  . Chronic anticoagulation 03/22/2013  . Bipolar disorder 03/22/2013    Past Surgical History  Procedure Laterality Date  . Mastectomy  bilateral  . Breast lumpectomy    . Ovaries removed    . Tonsillectomy and adenoidectomy    . Lymph node biopsy      Family History  Problem Relation Age of Onset  . Alcohol abuse Mother   . COPD Mother   . Alcohol abuse Father   . Rectal cancer Father   . Heart disease Father   . Pneumonia Father   . OCD Daughter   . Bipolar disorder Daughter   . Hypertension Daughter   . Schizophrenia Daughter   . Schizophrenia Daughter   . Bipolar disorder Daughter   . OCD Daughter   . Hypertension Daughter   .  COPD Daughter   . ADD / ADHD Grandchild   . Anxiety disorder Grandchild   . Depression Grandchild   . Healthy Sister   . Cancer Brother     leukemia   Social History:  reports that she has never smoked. She has never used smokeless tobacco. She reports that she does not drink alcohol or use illicit drugs.  Allergies:  Allergies  Allergen Reactions  . Ceftin Rash  . Cefuroxime Axetil Rash  . Risperidone And Related Other (See Comments)    Has medical contraindications    Medications Prior to Admission  Medication Sig Dispense Refill  . alprazolam (XANAX) 2 MG tablet Take 1-2 mg by mouth 2 (two) times daily as needed (May take a 1 mg during the day but always takes 2 mg at bedtime.). For anxiety      . anastrozole (ARIMIDEX) 1 MG tablet Take 1 mg by mouth daily.        Marland Kitchen docusate sodium (STOOL SOFTENER) 100 MG capsule Take 2 capsules (200 mg total) by mouth at bedtime.  10 capsule  0  . fish oil-omega-3 fatty acids 1000 MG capsule Take 1 g by mouth 2 (two) times daily.      . fluticasone (FLONASE) 50 MCG/ACT nasal spray Place 2 sprays into the nose  daily.       . levocetirizine (XYZAL) 5 MG tablet Take 5 mg by mouth every evening.       . lidocaine (LIDODERM) 5 % Place 1 patch onto the skin daily. Remove & Discard patch within 12 hours or as directed by MD (only uses prn)      . metoprolol (TOPROL-XL) 100 MG 24 hr tablet Take 100 mg by mouth daily.        . peg 3350 powder (MOVIPREP) 100 G SOLR Take 1 kit (200 g total) by mouth once.  1 kit  0  . simvastatin (ZOCOR) 20 MG tablet Take 20 mg by mouth daily.       Marland Kitchen HYDROcodone-acetaminophen (NORCO/VICODIN) 5-325 MG per tablet Take 1 tablet by mouth every 4 (four) hours as needed for pain.  15 tablet  0  . promethazine (PHENERGAN) 25 MG tablet Take 25 mg by mouth every 6 (six) hours as needed for nausea.       Marland Kitchen warfarin (COUMADIN) 1 MG tablet Take 1-2 mg by mouth daily. Takes 2 mg on along with 5 mg to make 7 mg on all days except Tues  and Sat when patient takes 1 mg along with a 5 mg to make 6 mg.      . warfarin (COUMADIN) 5 MG tablet Take 5 mg by mouth daily. Takes 2 mg on along with 5 mg to make 7 mg on all days except Tues and Sat when patient takes 1 mg along with a 5 mg to make 6 mg.        No results found for this or any previous visit (from the past 48 hour(s)). No results found.  ROS  Blood pressure 135/90, pulse 100, temperature 97.9 F (36.6 C), temperature source Oral, resp. rate 19, height 5\' 2"  (1.575 m), weight 210 lb (95.255 kg), SpO2 98.00%. Physical Exam  Constitutional: She appears well-developed and well-nourished.  HENT:  Mouth/Throat: Oropharynx is clear and moist.  Eyes: Conjunctivae are normal. No scleral icterus.  Neck: No thyromegaly present.  Cardiovascular: Normal rate, regular rhythm and normal heart sounds.   No murmur heard. Respiratory: Effort normal and breath sounds normal.  GI: Soft. She exhibits no distension. Tenderness: mild tenderness at RLQ.  Musculoskeletal: She exhibits no edema.  Lymphadenopathy:    She has no cervical adenopathy.  Neurological: She is alert.  Skin: Skin is warm and dry.     Assessment/Plan Recurrent nausea. Chronic GERD. History of colonic adenomas. Agnostic EGD and surveillance colonoscopy.  REHMAN,NAJEEB U 07/23/2013, 1:39 PM
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
## 2013-07-24 ENCOUNTER — Encounter (HOSPITAL_COMMUNITY): Payer: Self-pay | Admitting: Psychiatry

## 2013-07-24 ENCOUNTER — Ambulatory Visit (HOSPITAL_COMMUNITY): Payer: Self-pay | Admitting: Psychiatry

## 2013-07-29 ENCOUNTER — Encounter (HOSPITAL_COMMUNITY): Payer: Self-pay | Admitting: Internal Medicine

## 2013-07-30 ENCOUNTER — Ambulatory Visit (HOSPITAL_BASED_OUTPATIENT_CLINIC_OR_DEPARTMENT_OTHER): Payer: Medicare Other | Admitting: Pharmacist

## 2013-07-30 ENCOUNTER — Other Ambulatory Visit (HOSPITAL_BASED_OUTPATIENT_CLINIC_OR_DEPARTMENT_OTHER): Payer: Medicare Other | Admitting: Lab

## 2013-07-30 DIAGNOSIS — I82629 Acute embolism and thrombosis of deep veins of unspecified upper extremity: Secondary | ICD-10-CM

## 2013-07-30 DIAGNOSIS — D6859 Other primary thrombophilia: Secondary | ICD-10-CM

## 2013-07-30 DIAGNOSIS — D682 Hereditary deficiency of other clotting factors: Secondary | ICD-10-CM

## 2013-07-30 LAB — PROTIME-INR: Protime: 27.6 Seconds — ABNORMAL HIGH (ref 10.6–13.4)

## 2013-07-30 LAB — POCT INR: INR: 2.3

## 2013-07-30 NOTE — Progress Notes (Signed)
INR = 2.3 on 7 mg/day; 6 mg Tu/Sat. Pt had EGD w/ colonoscopy on 07/23/13 & her Coumadin was held x 3 days Pt c/o insomnia & inquired about RX for this.  I advised her to try Melatonin 5-10 mg QHS PRN. She has RX for Z-pack for sinus infection that she will use if she needs it.  She will call us if she starts this. Pt will remain on Arimidex x another 2 years so her preference is to stay on Coumadin while on AI. INR at goal.  No change to dose. Return in 1 month.   Ebony Hail, Pharm.D., CPP 07/30/2013@2 :47 PM

## 2013-08-01 ENCOUNTER — Encounter (HOSPITAL_COMMUNITY): Payer: Self-pay | Admitting: Psychiatry

## 2013-08-01 ENCOUNTER — Ambulatory Visit (INDEPENDENT_AMBULATORY_CARE_PROVIDER_SITE_OTHER): Payer: Medicare Other | Admitting: Psychiatry

## 2013-08-01 VITALS — BP 140/100 | Ht 62.0 in | Wt 211.0 lb

## 2013-08-01 DIAGNOSIS — F319 Bipolar disorder, unspecified: Secondary | ICD-10-CM

## 2013-08-01 DIAGNOSIS — F429 Obsessive-compulsive disorder, unspecified: Secondary | ICD-10-CM

## 2013-08-01 MED ORDER — TRAZODONE HCL 50 MG PO TABS: 50.0000 mg | ORAL_TABLET | Freq: Every day | ORAL | Status: DC

## 2013-08-01 MED ORDER — ESCITALOPRAM OXALATE 20 MG PO TABS: 20.0000 mg | ORAL_TABLET | Freq: Every day | ORAL | Status: DC
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
## 2013-08-05 ENCOUNTER — Encounter (INDEPENDENT_AMBULATORY_CARE_PROVIDER_SITE_OTHER): Payer: Self-pay | Admitting: *Deleted

## 2013-08-07 ENCOUNTER — Other Ambulatory Visit: Payer: Self-pay | Admitting: Adult Health

## 2013-08-11 DIAGNOSIS — Z029 Encounter for administrative examinations, unspecified: Secondary | ICD-10-CM

## 2013-08-14 ENCOUNTER — Other Ambulatory Visit: Payer: Self-pay

## 2013-08-27 ENCOUNTER — Encounter: Payer: Self-pay | Admitting: Pharmacist

## 2013-08-27 ENCOUNTER — Ambulatory Visit: Payer: Self-pay

## 2013-08-27 ENCOUNTER — Other Ambulatory Visit: Payer: Self-pay | Admitting: Lab

## 2013-08-27 NOTE — Progress Notes (Signed)
FTKA in coumadin clinic today.  Left VM to reschedule. 

## 2013-09-16 ENCOUNTER — Telehealth: Payer: Self-pay | Admitting: Oncology

## 2013-09-16 NOTE — Telephone Encounter (Signed)
pt called and r/s appt for lab and MD on 12/16 to 11/04/12, nurse notified

## 2013-09-23 ENCOUNTER — Ambulatory Visit: Payer: Self-pay | Admitting: Oncology

## 2013-09-23 ENCOUNTER — Other Ambulatory Visit: Payer: Self-pay

## 2013-09-29 ENCOUNTER — Telehealth: Payer: Self-pay | Admitting: Pharmacist
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
## 2013-10-10 ENCOUNTER — Ambulatory Visit (HOSPITAL_COMMUNITY): Payer: Self-pay | Admitting: Psychiatry

## 2013-10-13 ENCOUNTER — Ambulatory Visit (HOSPITAL_COMMUNITY): Payer: Self-pay | Admitting: Psychiatry

## 2013-11-04 ENCOUNTER — Ambulatory Visit: Payer: Self-pay

## 2013-11-04 ENCOUNTER — Other Ambulatory Visit: Payer: Self-pay

## 2013-11-04 ENCOUNTER — Ambulatory Visit: Payer: Medicare Other | Admitting: Oncology

## 2013-11-04 ENCOUNTER — Encounter: Payer: Self-pay | Admitting: Pharmacist
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
## 2013-11-17 ENCOUNTER — Ambulatory Visit: Payer: Medicare Other | Admitting: Pharmacist

## 2013-11-17 LAB — POCT INR: INR: 2

## 2013-11-17 NOTE — Progress Notes (Signed)
INR at goal INR results received via fax from [facility] (her PCP is Dr. Hilma Favors). Pt has had issues with insurance Tried calling patient this afternoon to discuss results Unable to reach patient by phone today (left message with instructions to call back to discuss INR results and to schedule next appointment either here or with [facility]) As far as anticoagulation plan: Ms. Slingerland can continue her dose of 7 mg daily except for 6 mg on Tues & Sat.  Recheck INR in approximately 1 month on 12/15/13

## 2013-11-17 NOTE — Patient Instructions (Addendum)
INR at goal Continue your dose of 7 mg daily except for 6 mg on Tues & Sat.  Recheck INR on 12/15/13

## 2013-12-06 ENCOUNTER — Encounter: Payer: Self-pay | Admitting: Oncology

## 2013-12-12 ENCOUNTER — Telehealth: Payer: Self-pay | Admitting: Pharmacist

## 2013-12-12 NOTE — Telephone Encounter (Signed)
Pt is trying to set up an appointment with Dr. [name] before he leaves. She is trying to combine her Lab/CC visit on the same day as her appt with Dr. [name]. These appointments will hopefully be set up next week. Pt will call us when her Lab/MD appt has been scheduled so we can add her to the Coumadin clinic schedule for that day. Pt has been moved to 3/13 in Dose Response to remind [name] to f/u on appts.

## 2013-12-18 ENCOUNTER — Telehealth: Payer: Self-pay | Admitting: Pharmacist

## 2013-12-18 NOTE — Telephone Encounter (Addendum)
Pt unable to get office visit with Dr. [name] before he leaves. Dr. [name] will still manage anticoagulation. Pt will have labs drawn at [facility] near her and have the results reviewed by pharmacists (since she has concerns with transportation to and from [place]).   Faxed lab orders to [place] (Fax: [phone]) - Received fax confirmation: Please collect CBC Q 43months and PT/INR monthly by fingerstick (Code: 17793). Please fax results to [phone] or cal [phone]. Pt is due January 23, 2014. Dx codes: 289.81 and 453.4

## 2014-01-19 MED ORDER — WARFARIN SODIUM 5 MG PO TABS: ORAL_TABLET | ORAL | Status: DC

## 2014-01-19 MED ORDER — WARFARIN SODIUM 1 MG PO TABS: ORAL_TABLET | ORAL | Status: DC

## 2014-01-20 NOTE — Progress Notes (Signed)
Patient ID: Jessica Reilly, female   DOB: 05-16-1961, 53 y.o.   MRN: [id number] The patient did not report for this visit

## 2014-01-21 ENCOUNTER — Telehealth: Payer: Self-pay | Admitting: Pharmacist

## 2014-01-21 LAB — POCT INR: INR: 2.4

## 2014-01-21 NOTE — Telephone Encounter (Signed)
PT/INR results faxed today from [facility] labs. INR = 2.4.   Left VM asking patient to call and discuss results as well as letting us know what date to expect next labs (about 1 month). Plan to continue same dose Coumadin 7 mg daily except 6 mg Tues and Sat.

## 2014-01-22 ENCOUNTER — Ambulatory Visit: Payer: Medicare Other | Admitting: Pharmacist

## 2014-01-22 ENCOUNTER — Telehealth: Payer: Self-pay | Admitting: Pharmacist

## 2014-01-22 NOTE — Progress Notes (Addendum)
INR = 2.4 on 01/21/14; drawn at [facility] lab. I s/w pt over phone today w/ results & she is doing well w/ current Coumadin dosed at 7 mg/day; 6 mg on Tu/Sat. Pt reports she had a CBC done but I do not have those results; they may have been faxed to Dr. [name] (?).  I do not have a phone number to call the [facility] lab for the CBC result.  This is done every 3 mos. She has had increased back pain & has seen MD to consider injections.  She has an X-ray appt soon & after that she will have consult for injections. I explained to pt that the MD doing the procedure will need to communicate w/ Dr. [name] if she needs to go off anticoagulants for the injections.  Pt is aware & will discuss w/ the provider she is seeing for back pain. INR at goal.  She'll stay on same dose & return to [facility] for INR check again 02/20/14 unless changes occur before then for procedure. NO CHARGE- phone encounter. Kennith Center, Pharm.D., CPP 01/22/2014@1 :50 PM  I was able to get CBC results faxed from [facility] (ph# [phone] for lab but for results, call customer svc at [phone] opt 2) Hgb = 12.9 g/dL; Hct = 37.8; Plct = 261. Everything else on CBC is normal. I called pt w/ results.  She'll have another CBC in 3 mos. Kennith Center, Pharm.D., CPP 01/22/2014@2 :56 PM

## 2014-01-22 NOTE — Telephone Encounter (Signed)
Received request to update standing orders from [facility]. I completed the dx, frequency and physician information sections and faxed form back to them. Fax confirmation received. Zoe Lan Fax: [phone], Phone:  [phone] Copy of lab orders are in the coumadin clinic binder. A copy of completed standing order form will be scanned into patient chart. INR monthly with dx codes: 289.81 and 453.4 CBC q 3 months with dx codes: 289.81 and 453.4 The standing order renewal is good for a maximum of 6 months: 02/20/14 - 08/23/14

## 2014-01-23 ENCOUNTER — Other Ambulatory Visit (HOSPITAL_COMMUNITY): Payer: Self-pay | Admitting: Physical Medicine and Rehabilitation

## 2014-01-23 ENCOUNTER — Other Ambulatory Visit: Payer: Self-pay | Admitting: Oncology

## 2014-01-23 ENCOUNTER — Ambulatory Visit (HOSPITAL_COMMUNITY)
Admission: RE | Admit: 2014-01-23 | Discharge: 2014-01-23 | Disposition: A | Discharge status: Home / Self Care | Payer: Medicare Other | Source: Ambulatory Visit | Attending: Physical Medicine and Rehabilitation | Admitting: Physical Medicine and Rehabilitation

## 2014-01-23 DIAGNOSIS — D6859 Other primary thrombophilia: Secondary | ICD-10-CM

## 2014-01-23 DIAGNOSIS — M545 Low back pain, unspecified: Secondary | ICD-10-CM

## 2014-01-23 DIAGNOSIS — M5137 Other intervertebral disc degeneration, lumbosacral region: Secondary | ICD-10-CM | POA: Insufficient documentation

## 2014-01-23 DIAGNOSIS — M4120 Other idiopathic scoliosis, site unspecified: Secondary | ICD-10-CM | POA: Insufficient documentation

## 2014-01-23 DIAGNOSIS — I82629 Acute embolism and thrombosis of deep veins of unspecified upper extremity: Secondary | ICD-10-CM

## 2014-01-23 DIAGNOSIS — M47817 Spondylosis without myelopathy or radiculopathy, lumbosacral region: Secondary | ICD-10-CM | POA: Insufficient documentation

## 2014-01-23 DIAGNOSIS — M51379 Other intervertebral disc degeneration, lumbosacral region without mention of lumbar back pain or lower extremity pain: Secondary | ICD-10-CM | POA: Insufficient documentation

## 2014-01-26 ENCOUNTER — Telehealth: Payer: Self-pay | Admitting: *Deleted

## 2014-01-26 NOTE — Telephone Encounter (Signed)
Call from Dr Anselmo Rod about medical clearance per Dr [name] d/t pt being on Coumadin; form was faxed to her office this morning per front office. Form to be scanned into pt's chart.

## 2014-02-02 ENCOUNTER — Telehealth: Payer: Self-pay | Admitting: Pharmacist

## 2014-02-02 NOTE — Telephone Encounter (Signed)
Pt called to let us know she was having an injection procedure performed this week on 02/03/14. Her physician performing the procedure has requested she be off coumadin x 5 days prior (Ms. Goshorn has been holding her coumadin since Wednesday 01/28/14). Discussed with Dr. [name] and he is ok with this plan with no lovenox bridge necessary. Pt will have her INR checked on 02/02/14 prior to procedure via [facility] labs and results will be faxed into [name]. Pt will restart coumadin evening of procedure on 02/03/14 as long as patient has no complications. Plan to recheck INR at scheduled monthly lab with [facility] on 02/20/14.  Thank you, Montel Clock, PharmD

## 2014-02-17 ENCOUNTER — Encounter: Payer: Self-pay | Admitting: Oncology

## 2014-02-20 ENCOUNTER — Telehealth: Payer: Self-pay | Admitting: Pharmacist

## 2014-02-20 NOTE — Telephone Encounter (Signed)
Ms. Javed was scheduled to have her INR checked with [facility] labs today. We have not received results yet. I called and left VM with patient to call back with results or to have [place] labs fax results to [name].  Thank you,  Montel Clock, PharmD

## 2014-02-23 ENCOUNTER — Ambulatory Visit (INDEPENDENT_AMBULATORY_CARE_PROVIDER_SITE_OTHER): Payer: Medicare Other | Admitting: Pharmacist

## 2014-02-23 LAB — POCT INR: INR: 1.3

## 2014-02-23 NOTE — Progress Notes (Signed)
PHONE [place] called pharmacy to inform us she has been sick w/ upper resp tract infection.  She went to urgent care today & was RX'd: Levaquin & phenergan.  While there, she was given a "steroid shot, breathing treatment & Zofran." Her INR at [facility] lab today (external) = 1.3  She has not missed any doses of Coumadin.  Her current dose is 7 mg daily except for 6 mg on Tues & Sat. Given new RX of Levaqin & steroid inj x 1 (both have potential to incr INR), I advised pt to take Coumadin 7 mg today (low INR) and then back down on her Coumadin dose while on abx to 5 mg daily. She'll go back to [place] on Mon 5/25 for next protime check.  If she develops any bleeding/bruising before then, she knows to call us. Kennith Center, Pharm.D., CPP 02/23/2014@12 :49 PM

## 2014-03-03 ENCOUNTER — Ambulatory Visit: Payer: Medicare Other | Admitting: Pharmacist

## 2014-03-03 LAB — POCT INR: INR: 1.5

## 2014-03-03 NOTE — Progress Notes (Signed)
*  Telephone Encounter - No Charge* INR below goal today Pt is doing well with no complaints Pt reports no missed or extra doses No unusual bleeding or bruising and no signs/symptoms of clotting Pt finished course of levaquin on 03/01/14 and was taking 5 mg daily Will increase her back to regular regimen now that antibiotics are complete No other medication or diet changes Plan: Take Coumadin 7 mg today then return to your regular dose of 7 mg daily except for 6 mg on Tues/Saturdays. Recheck INR on 03/23/14 at [facility].  We will call when we have results.

## 2014-03-03 NOTE — Patient Instructions (Addendum)
INR below goal Take Coumadin 7 mg today then return to your regular dose of 7 mg daily except for 6 mg on Tues/Saturdays. Recheck INR on 03/23/14 at [facility].  We will call you when we have results.

## 2014-03-18 ENCOUNTER — Other Ambulatory Visit: Payer: Self-pay | Admitting: Adult Health

## 2014-03-23 ENCOUNTER — Ambulatory Visit: Payer: Medicare Other | Admitting: Pharmacist

## 2014-03-23 LAB — POCT INR: INR: 2.3

## 2014-03-23 NOTE — Patient Instructions (Signed)
Continue your regular dose of 7 mg daily except for 6 mg on Tues/Saturdays. Recheck INR on 04/22/14 at [facility].  We will call you when we have results.

## 2014-03-23 NOTE — Progress Notes (Signed)
**  Telephone Encounter - No Charge** INR at goal Pt is doing well Pt had INR checked at [facility] labs today and we were faxed the results INR appears to now be stabilizing. No unusual bleeding or bruising No missed or extra doses No medication or diet changes reported Plan: Continue your regular dose of 7 mg daily except for 6 mg on Tues/Saturdays. Recheck INR on 04/22/14 at [facility].  We will call pt when we have results.

## 2014-04-22 ENCOUNTER — Telehealth: Payer: Self-pay | Admitting: Pharmacist

## 2014-04-24 ENCOUNTER — Telehealth: Payer: Self-pay | Admitting: Pharmacist

## 2014-04-24 LAB — POCT INR: INR: 2.6

## 2014-04-24 NOTE — Telephone Encounter (Signed)
Called patient to see if she reported to [facility] to have her labs drawn. No answer and VM was unavailable. I called [facility] - pt had not been in on 04/22/14. She may report on 04/23/14 per [facility]. [facility] Phone: [phone]

## 2014-04-29 ENCOUNTER — Ambulatory Visit (INDEPENDENT_AMBULATORY_CARE_PROVIDER_SITE_OTHER): Payer: Medicare Other | Admitting: Pharmacist

## 2014-04-29 DIAGNOSIS — D6859 Other primary thrombophilia: Secondary | ICD-10-CM

## 2014-04-29 NOTE — Patient Instructions (Signed)
INR at goal No changes Continue your regular dose of 7 mg daily except for 6 mg on Tues/Saturdays. Recheck INR on 05/20/14 at [facility].  We will call you when we have results.

## 2014-04-29 NOTE — Progress Notes (Signed)
*  Telephone Encounter No Charge* INR at goal Received fax today from [facility] labs for INR from 7/17 CBC wnl Pt is doing well with no complaints No unusual bleeding or bruising No missed or extra doses  No medication or diet changes Plan:  Continue your regular dose of 7 mg daily except for 6 mg on Tues/Saturdays. Recheck INR on 05/22/14 at [facility].  We will call you when we have results.

## 2014-05-18 NOTE — Telephone Encounter (Signed)
Phone call - encounter closed. 

## 2014-05-22 ENCOUNTER — Telehealth: Payer: Self-pay | Admitting: Pharmacist

## 2014-05-22 LAB — POCT INR: INR: 2.7

## 2014-05-22 NOTE — Telephone Encounter (Signed)
Left vm to check to see if pt went to have her lab drawn today at [facility], Montel Clock, PharmD, BCOP

## 2014-05-27 ENCOUNTER — Ambulatory Visit: Payer: Medicare Other | Admitting: Pharmacist

## 2014-05-27 DIAGNOSIS — D6859 Other primary thrombophilia: Secondary | ICD-10-CM

## 2014-05-27 NOTE — Patient Instructions (Signed)
INR at goal No changes Continue your regular dose of 7 mg daily except for 6 mg on Tues/Saturdays. Recheck INR on 06/22/14 at Dr. [name] office visit.  We will call you when we have results

## 2014-05-27 NOTE — Progress Notes (Signed)
INR at goal *telephone encounter no charge* Pt had INR checked at [facility] on Friday 05/22/14 but we did not received the results last week Results received today and I contacted Jessica Reilly via telephone INR 2.7 Pt is doing well with no complaints No missed or extra doses No medication or diet changes No unusual bleeding or bruising Plan: Continue your regular dose of 7 mg daily except for 6 mg on Tues/Saturdays. Recheck INR on 06/22/14 at Dr. [name] office visit.  We will call you when we have results

## 2014-06-04 NOTE — Telephone Encounter (Signed)
Encounter was telephone call. 

## 2014-06-09 ENCOUNTER — Telehealth: Payer: Self-pay | Admitting: Pharmacist

## 2014-06-09 NOTE — Telephone Encounter (Signed)
Pt called and stated that she had an INR done today at an outside office. INR = 1.3. Her coumadin is on hold (x 3 days) for procedure tomorrow. She will resume coumadin tomorrow evening after the procedure. Dr. [name] is aware. She has an appt with Dr. [name] on 06/22/14.

## 2014-06-16 ENCOUNTER — Ambulatory Visit (HOSPITAL_COMMUNITY)
Admission: RE | Admit: 2014-06-16 | Discharge: 2014-06-16 | Disposition: A | Discharge status: Home / Self Care | Payer: Medicare Other | Source: Ambulatory Visit | Attending: Family Medicine | Admitting: Family Medicine

## 2014-06-16 ENCOUNTER — Other Ambulatory Visit (HOSPITAL_COMMUNITY): Payer: Self-pay | Admitting: Family Medicine

## 2014-06-16 DIAGNOSIS — M25519 Pain in unspecified shoulder: Secondary | ICD-10-CM | POA: Insufficient documentation

## 2014-06-16 DIAGNOSIS — M25512 Pain in left shoulder: Secondary | ICD-10-CM

## 2014-06-22 ENCOUNTER — Encounter: Payer: Self-pay | Admitting: Oncology

## 2014-06-22 ENCOUNTER — Ambulatory Visit (INDEPENDENT_AMBULATORY_CARE_PROVIDER_SITE_OTHER): Payer: Medicare Other | Admitting: Oncology

## 2014-06-22 VITALS — BP 128/87 | HR 63 | Temp 97.5°F | Ht 62.0 in | Wt 220.1 lb

## 2014-06-22 DIAGNOSIS — D6851 Activated protein C resistance: Secondary | ICD-10-CM

## 2014-06-22 DIAGNOSIS — D689 Coagulation defect, unspecified: Secondary | ICD-10-CM

## 2014-06-22 DIAGNOSIS — C50919 Malignant neoplasm of unspecified site of unspecified female breast: Secondary | ICD-10-CM

## 2014-06-22 DIAGNOSIS — C50911 Malignant neoplasm of unspecified site of right female breast: Secondary | ICD-10-CM

## 2014-06-22 DIAGNOSIS — I82629 Acute embolism and thrombosis of deep veins of unspecified upper extremity: Secondary | ICD-10-CM

## 2014-06-22 DIAGNOSIS — I82622 Acute embolism and thrombosis of deep veins of left upper extremity: Secondary | ICD-10-CM

## 2014-06-22 DIAGNOSIS — D6859 Other primary thrombophilia: Secondary | ICD-10-CM

## 2014-06-22 DIAGNOSIS — Z170 Estrogen receptor positive status [ER+]: Secondary | ICD-10-CM

## 2014-06-22 LAB — COMPREHENSIVE METABOLIC PANEL
ALBUMIN: 3.5 g/dL (ref 3.5–5.2)
ALT: 20 U/L (ref 0–35)
AST: 16 U/L (ref 0–37)
Alkaline Phosphatase: 90 U/L (ref 39–117)
BUN: 17 mg/dL (ref 6–23)
CHLORIDE: 101 meq/L (ref 96–112)
CO2: 26 mEq/L (ref 19–32)
Calcium: 9.2 mg/dL (ref 8.4–10.5)
Creat: 1.06 mg/dL (ref 0.50–1.10)
GLUCOSE: 90 mg/dL (ref 70–99)
POTASSIUM: 4.3 meq/L (ref 3.5–5.3)
Sodium: 141 mEq/L (ref 135–145)
TOTAL PROTEIN: 7 g/dL (ref 6.0–8.3)
Total Bilirubin: 0.3 mg/dL (ref 0.2–1.2)

## 2014-06-22 LAB — CBC WITH DIFFERENTIAL/PLATELET
Basophils Absolute: 0 10*3/uL (ref 0.0–0.1)
Basophils Relative: 0 % (ref 0–1)
EOS ABS: 0.1 10*3/uL (ref 0.0–0.7)
Eosinophils Relative: 1 % (ref 0–5)
HCT: 40.1 % (ref 36.0–46.0)
Hemoglobin: 13.2 g/dL (ref 12.0–15.0)
LYMPHS PCT: 31 % (ref 12–46)
Lymphs Abs: 2.7 10*3/uL (ref 0.7–4.0)
MCH: 29.5 pg (ref 26.0–34.0)
MCHC: 32.9 g/dL (ref 30.0–36.0)
MCV: 89.7 fL (ref 78.0–100.0)
Monocytes Absolute: 0.4 10*3/uL (ref 0.1–1.0)
Monocytes Relative: 5 % (ref 3–12)
NEUTROS PCT: 63 % (ref 43–77)
Neutro Abs: 5.4 10*3/uL (ref 1.7–7.7)
Platelets: 273 10*3/uL (ref 150–400)
RBC: 4.47 MIL/uL (ref 3.87–5.11)
RDW: 14.5 % (ref 11.5–15.5)
WBC: 8.6 10*3/uL (ref 4.0–10.5)

## 2014-06-22 LAB — PROTIME-INR
INR: 1.78 — ABNORMAL HIGH (ref <1.50)
PROTHROMBIN TIME: 20.7 s — AB (ref 11.6–15.2)

## 2014-06-22 LAB — POCT INR: INR: 1.78

## 2014-06-22 NOTE — Patient Instructions (Addendum)
Lab today Return visit with Dr Darnell Level in  1 year  Lab 1 week before visit Continue to monitor coumadin levels at [place] office and send reports to Dr Darnell Level

## 2014-06-23 ENCOUNTER — Encounter: Payer: Self-pay | Admitting: Pharmacist

## 2014-06-23 ENCOUNTER — Telehealth: Payer: Self-pay | Admitting: *Deleted

## 2014-06-23 NOTE — Telephone Encounter (Signed)
Message copied by Ebbie Latus on Tue Jun 23, 2014  4:00 PM ------      Message from: Annia Belt      Created: Mon Jun 22, 2014  7:08 PM       Call pt lab results all normal      (mild elevation of PT normal on Xarelto) ------

## 2014-06-23 NOTE — Progress Notes (Signed)
INR=1.7 yesterday at Dr. [name] office.  Left VM for Ms Bowmer to call and disucss INR results.

## 2014-06-23 NOTE — Telephone Encounter (Signed)
Called pt about her lab results. Told pt about mild elevation of PT which is normal while being on Xarelto per Dr [name]; pt stated she is not on Xarelto. Pt is on Coumadin - 6mg  on Tues/Sat and 7mg  on the other days. Stated she did received a joint injection rcently. Also I see that Melissa G. Pharmacist has seen pt's PT/INR result (see note from today).

## 2014-06-24 NOTE — Progress Notes (Signed)
Patient ID: Jessica Reilly, female   DOB: 11/16/1960, 53 y.o.   MRN: [id number] Hematology and Oncology Follow Up Visit  MEKENNA FINAU [id number] September 12, 1961 53 y.o. 06/24/2014 11:46 AM   Principle Diagnosis: Encounter Diagnoses  Name Primary?  . Coagulopathy Yes  . Factor V Leiden mutation   . DVT of upper extremity (deep vein thrombosis), left   . Lobular carcinoma of breast, stage 2, estrogen receptor positive, right   . Primary hypercoagulable state      Interim History:   Clinical summary copy and pasted from progress note 03/22/2013 with modifications to update age:  53 year old woman with a history of a stage II, T2 N0 ER/PR positive HER-2 negative cancer of the right breast status post right modified radical mastectomy and elective simple left mastectomy with subsequent bilateral oophorectomy. She received postoperative radiation to the right chest wall, axilla, and supraclavicular area. She sustained a left upper extremity DVT following left breast surgery. She tested negative for the BRCA1 and BRCA 2 genes by her history.  She received neoadjuvant CEF chemotherapy for 5 cycles. Initial tumor measured 4.1 x 3.1 x 2.7 cm on mammogram done 06/25/2007. She had her surgery at [facility] in [place]. I was able to obtain records since her last visit. Final tumor size was 1.7 cm. Grade 3. 0 of 3 sentinel lymph nodes tested negative. She had very poor tolerance to the treatments. She was then put on hormonal therapy with Arimidex.  Due to the perioperative left upper extremity DVT, lab testing was done and she was found to be a heterozygote for the factor V Leiden gene mutation. She was anticoagulated and she has been kept on therapeutic Coumadin.  Although the risk of thrombosis on an aromatase inhibitor is less then on tamoxifen, given her history, I felt it was reasonable to continue the Coumadin until she completes a planned 5 year course of Arimidex in September 2014.  She has elected to have her Coumadin monitored through our office.  She had a recent followup with her oncologist at [facility] Dr. Mercie Eon. It was her clinical judgment that the Arimidex hormonal therapy should be continued past 5 years.   Overall she is doing quite well. She's had no interim medical problems. She denies any headache or change in vision, no focal bone pain, no vaginal bleeding or discharge. She continues on Arimidex and full dose Coumadin anticoagulation.  Medications: reviewed  Allergies:  Allergies  Allergen Reactions  . Ceftin Rash  . Cefuroxime Axetil Rash  . Risperidone And Related Other (See Comments)    Has medical contraindications    Review of Systems: Hematology:  No bleeding or bruising ENT ROS: No sore throat Breast ROS: Bilateral mastectomies Respiratory ROS: No cough or dyspnea Cardiovascular ROS:  No chest pain or palpitations Gastrointestinal ROS:  No abdominal pain or change in bowel habit  Genito-Urinary ROS: See above Musculoskeletal ROS: See above Neurological ROS: See above Dermatological ROS: No rash Remaining ROS negative:   Physical Exam: Blood pressure 128/87, pulse 63, temperature 97.5 F (36.4 C), temperature source Oral, height _0  (1.575 m), weight 220 lb 1.6 oz (99.837 kg), SpO2 97.00%. Wt Readings from Last 3 Encounters:  06/22/14 220 lb 1.6 oz (99.837 kg)  08/01/13 211 lb (95.709 kg)  07/23/13 210 lb (95.255 kg)     General appearance: Well-nourished Caucasian woman HENNT: Pharynx no erythema, exudate, mass, or ulcer. No thyromegaly or thyroid nodules Lymph nodes: No cervical, supraclavicular, or axillary lymphadenopathy  Breasts:  Bilateral mastectomies. No chest wall lesions. Lungs: Clear to auscultation, resonant to percussion throughout Heart: Regular rhythm, no murmur, no gallop, no rub, no click, no edema Abdomen: Soft, nontender, normal bowel sounds, no mass, no organomegaly Extremities: No edema, no calf  tenderness Musculoskeletal: no joint deformities. She continues to have polyarthralgia related to degenerative arthritis. No improvement in her symptoms on Ultram. Tylenol was not effective in controlling her pain. GU:  Vascular: Carotid pulses 2+, no bruits, distal pulses: Dorsalis pedis 1+ symmetric Neurologic: Alert, oriented, PERRLA, cranial nerves grossly normal, motor strength 5 over 5, reflexes 1+ symmetric, upper body coordination normal, gait normal, Skin: No rash or ecchymosis  Lab Results: CBC W/Diff    Component Value Date/Time   WBC 8.6 06/22/2014 1043   WBC 7.4 02/20/2013 1342   RBC 4.47 06/22/2014 1043   RBC 4.38 02/20/2013 1342   HGB 13.2 06/22/2014 1043   HGB 12.9 02/20/2013 1342   HCT 40.1 06/22/2014 1043   HCT 38.8 02/20/2013 1342   PLT 273 06/22/2014 1043   PLT 275 02/20/2013 1342   MCV 89.7 06/22/2014 1043   MCV 88.6 02/20/2013 1342   MCH 29.5 06/22/2014 1043   MCH 29.5 02/20/2013 1342   MCHC 32.9 06/22/2014 1043   MCHC 33.2 02/20/2013 1342   RDW 14.5 06/22/2014 1043   RDW 13.9 02/20/2013 1342   LYMPHSABS 2.7 06/22/2014 1043   LYMPHSABS 2.9 02/20/2013 1342   MONOABS 0.4 06/22/2014 1043   MONOABS 0.3 02/20/2013 1342   EOSABS 0.1 06/22/2014 1043   EOSABS 0.2 02/20/2013 1342   BASOSABS 0.0 06/22/2014 1043   BASOSABS 0.0 02/20/2013 1342     Chemistry      Component Value Date/Time   NA 141 06/22/2014 1043   NA 142 02/20/2013 1342   K 4.3 06/22/2014 1043   K 4.4 02/20/2013 1342   CL 101 06/22/2014 1043   CL 105 02/20/2013 1342   CO2 26 06/22/2014 1043   CO2 26 02/20/2013 1342   BUN 17 06/22/2014 1043   BUN 10.0 02/20/2013 1342   CREATININE 1.06 06/22/2014 1043   CREATININE 1.2* 02/20/2013 1342   CREATININE 1.07 01/03/2013 2217      Component Value Date/Time   CALCIUM 9.2 06/22/2014 1043   CALCIUM 9.5 02/20/2013 1342   ALKPHOS 90 06/22/2014 1043   ALKPHOS 100 02/20/2013 1342   AST 16 06/22/2014 1043   AST 29 02/20/2013 1342   ALT 20 06/22/2014 1043   ALT 33 02/20/2013 1342   BILITOT  0.3 06/22/2014 1043   BILITOT 0.37 02/20/2013 1342       Radiological Studies: Dg Shoulder Left  06/16/2014   CLINICAL DATA:  Left shoulder pain.  No known injury.  EXAM: LEFT SHOULDER - 2+ VIEW  COMPARISON:  02/11/2009  FINDINGS: The left shoulder is located. Negative for a fracture. The left AC joint is intact. Mild irregularity along the undersurface of the acromion is unchanged. Visualized left ribs are intact.  IMPRESSION: No acute bone abnormality to the left shoulder.   Electronically Signed   By: Markus Daft M.D.   On: 06/16/2014 13:56    Impression:  #1. 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  Status post isolated episode of upper extremity thrombosis following elective, left, simple mastectomy.  Ongoing hormonal therapy with Arimidex which has a low thrombogenic potential.  We had an identical discussion that we had when I saw her last year. I  reviewed with the patient again today  that heterozygote status for the 5 Leiden gene mutation is a minor risk factor for clotting. However, she would prefer to stay on Coumadin and not use alternative anticoagulants or aspirin for ongoing thromboprophylaxis. We will continue to monitor her Coumadin locally.   #2. T2, N0, M0 stage II, ER also did, HER-2 negative, grade 3, cancer of the right breast. Treated as outlined above.  She remains free of any obvious new disease now out almost 7 years from initial diagnosis.  I again reviewed with her the fact that national guidelines currently don't recommend extending aromatase inhibitor therapy past 5 years in the majority of individuals. She is not experiencing any side effects from the drug. Her former oncologist advised her to stay on it for 10 years. As long as she is on it, she is obligated to stay on anticoagulation. I cannot seem to dissuade her to the contrary.  #3. Bipolar disorder  Recommendation of psychiatrist who evaluated her was to go on Lexapro      CC: Patient Care Team: Sharilyn Sites, MD as PCP - General (Family Medicine)   Annia Belt, MD 9/16/201511:46 AM

## 2014-06-26 ENCOUNTER — Ambulatory Visit (INDEPENDENT_AMBULATORY_CARE_PROVIDER_SITE_OTHER): Payer: Self-pay | Admitting: Pharmacist

## 2014-06-26 DIAGNOSIS — D6859 Other primary thrombophilia: Secondary | ICD-10-CM

## 2014-06-26 MED ORDER — WARFARIN SODIUM 1 MG PO TABS: ORAL_TABLET | ORAL | Status: DC

## 2014-06-26 MED ORDER — WARFARIN SODIUM 5 MG PO TABS: ORAL_TABLET | ORAL | Status: DC

## 2014-06-26 NOTE — Progress Notes (Signed)
INR approaching goal after holding for procedure earlier this month (INR 1.78 on 06/22/14 at Dr. [name] office visit) *Telephone encounter no charge* Pt called back to discuss INR results from 9/14 at Dr. [name] office (messages left for pt on 06/23/14) No issues to report No medication or diet changes No missed or extra doses since restarting coumadin on 9/2 Pt held coumadin x 3 days prior to procedure and restarted coumadin on 06/10/14 INR from 06/22/14 likely below goal due to this (slowly trending towards goal). INR likely to trend up towards goal this week. Will make no changes  Plan: No changes Continue your regular dose of 7 mg daily except for 6 mg on Tues/Saturdays. Recheck INR on 07/23/14 at [facility]  We will call you when we have results

## 2014-06-26 NOTE — Patient Instructions (Signed)
INR approaching goal No changes Continue your regular dose of 7 mg daily except for 6 mg on Tues/Saturdays. Recheck INR on 07/23/14 at [facility]  We will call you when we have results

## 2014-07-22 ENCOUNTER — Telehealth: Payer: Self-pay | Admitting: Pharmacist

## 2014-07-22 NOTE — Telephone Encounter (Signed)
Patient called stating that [facility] did not have refills on her Coumadin 5 mg or Coumadin 1 mg. She said that she had requested for [name] to call in refills for her a month ago. I called and spoke with [employer]. Both RXs were filled yesterday and are waiting for pickup. Refills x 3 were called in last month. I added refills for a total of 6 months for each RX.

## 2014-07-24 ENCOUNTER — Ambulatory Visit: Payer: Medicare Other | Admitting: Pharmacist

## 2014-07-24 LAB — POCT INR: INR: 2.8

## 2014-07-24 NOTE — Progress Notes (Signed)
**  No charge Telephone encounter** **Dr [name] pt**  INR = 2.8 drawn at [facility] in [place].  I spoke with Jessica Vandall on the phone.  She has not had any bleeding or bruising.  No medication changes.  Will continue Coumadin 6mg  Tues/Sat and 7mg  other days.  Jessica Reilly with return to [facility] for next INR check in 1 month.

## 2014-08-01 ENCOUNTER — Other Ambulatory Visit: Payer: Self-pay | Admitting: Adult Health

## 2014-08-25 ENCOUNTER — Telehealth: Payer: Self-pay | Admitting: Pharmacist

## 2014-08-25 NOTE — Telephone Encounter (Signed)
Pt called this afternoon stated that [facility] labs needed new orders for lab draws for Ms. Jessica Reilly. Faxed new orders for monthly INR and CBC Q3 Months to [facility] labs (Fax: [phone]).  [name] stated she will go back [facility] labs later this week to have INR checked.  Thank you,  Montel Clock, Pharm.D., BCOP

## 2014-08-26 ENCOUNTER — Other Ambulatory Visit: Payer: Self-pay | Admitting: Oncology

## 2014-08-26 LAB — POCT INR: INR: 3.11

## 2014-08-27 ENCOUNTER — Ambulatory Visit: Payer: Medicare Other | Admitting: Pharmacist

## 2014-08-27 LAB — CBC WITH DIFFERENTIAL/PLATELET
BASOS PCT: 1 % (ref 0–1)
Basophils Absolute: 0.1 10*3/uL (ref 0.0–0.1)
EOS ABS: 0.2 10*3/uL (ref 0.0–0.7)
EOS PCT: 2 % (ref 0–5)
HEMATOCRIT: 38.2 % (ref 36.0–46.0)
Hemoglobin: 12.9 g/dL (ref 12.0–15.0)
Lymphocytes Relative: 34 % (ref 12–46)
Lymphs Abs: 2.8 10*3/uL (ref 0.7–4.0)
MCH: 29.7 pg (ref 26.0–34.0)
MCHC: 33.8 g/dL (ref 30.0–36.0)
MCV: 87.8 fL (ref 78.0–100.0)
MONO ABS: 0.4 10*3/uL (ref 0.1–1.0)
MPV: 10.7 fL (ref 9.4–12.4)
Monocytes Relative: 5 % (ref 3–12)
NEUTROS PCT: 58 % (ref 43–77)
Neutro Abs: 4.8 10*3/uL (ref 1.7–7.7)
Platelets: 265 10*3/uL (ref 150–400)
RBC: 4.35 MIL/uL (ref 3.87–5.11)
RDW: 15.3 % (ref 11.5–15.5)
WBC: 8.2 10*3/uL (ref 4.0–10.5)

## 2014-08-27 LAB — PROTIME-INR
INR: 3.11 — ABNORMAL HIGH (ref <1.50)
PROTHROMBIN TIME: 32 s — AB (ref 11.6–15.2)

## 2014-08-27 NOTE — Progress Notes (Signed)
**  Telephone encounter-no charge** Dr. [name] pt.  INR=3.11 H/H=12.9/38.2.  INR at goal.  No medication changes and no bleeding/bruising.  Jessica Reilly has been getting labs checked at [facility], but her insurance may be requiring her to switch to a different lab.  She will call if this happens and to have PT/INR orders sent to new lab provider.  Will continue coumadin 6mg  Tues/Sat at 7mg  other days.  Will check PT/INR in 59month.

## 2014-09-25 ENCOUNTER — Telehealth: Payer: Self-pay | Admitting: Pharmacist

## 2014-09-25 NOTE — Telephone Encounter (Signed)
Called patient and LVM for patient to have INR checked at [facility] labs at earliest convenience.    Thank you  Montel Clock, PharmD, BCOP

## 2014-10-05 ENCOUNTER — Telehealth: Payer: Self-pay | Admitting: Pharmacist

## 2014-10-05 ENCOUNTER — Telehealth: Payer: Self-pay

## 2014-10-05 NOTE — Telephone Encounter (Signed)
Patient called to request INR orders to be faxed to [facility] at [phone]. Orders faxed today. Attempted to call patient (per her request) to verify that orders were faxed.   No answer at the number given [phone].

## 2014-10-05 NOTE — Telephone Encounter (Signed)
Jessica Reilly called today to request an INR order. She has not yet had her INR checked this month as the usual lab she uses has closed and she has been trying to find another one covered by her insurance. She asked if we could fax an INR order to [facility] at [phone], which we did today. We also requested that [facility] call Ms. Alber at home to confirm they received it. We also specified that she prefers finger-stick draws per patient request.

## 2014-10-06 ENCOUNTER — Encounter: Payer: Self-pay | Admitting: Pharmacist

## 2014-10-06 NOTE — Progress Notes (Signed)
I spoke to Ms Gene on phone.  She is aware that PT/INR orders have been faxed to [facility].  Ms Lewan stated that she will get INR checked tomorrow 12/30 or Thursday.12/31.

## 2014-10-12 ENCOUNTER — Telehealth: Payer: Self-pay | Admitting: Pharmacist

## 2014-10-12 NOTE — Telephone Encounter (Signed)
Pt has not been to [facility] b/c she was sick w/ cold/sore throat.  She will go this week - maybe Wednesday. She is aware we will call her w/ results once we receive them. Kennith Center, Pharm.D., CPP 10/12/2014@3 :44 PM

## 2014-10-14 ENCOUNTER — Telehealth: Payer: Self-pay | Admitting: Pharmacist

## 2014-10-14 DIAGNOSIS — D6851 Activated protein C resistance: Secondary | ICD-10-CM | POA: Diagnosis not present

## 2014-10-14 DIAGNOSIS — I82622 Acute embolism and thrombosis of deep veins of left upper extremity: Secondary | ICD-10-CM | POA: Diagnosis not present

## 2014-10-14 NOTE — Telephone Encounter (Signed)
Patient called stating she had her INR drawn at [facility] but they needed to speak with [name] before faxing results. Called Kayla at [place] [phone]  Ext [phone]). She needed diagnosis prior to running/billing INR. Diagnosis given, verified that they have fax number to send [name] results. They will fax [name] results when they have them. We will call Mrs. Jessica Reilly with results.

## 2014-10-15 ENCOUNTER — Ambulatory Visit: Payer: Self-pay | Admitting: Pharmacist

## 2014-10-15 LAB — POCT INR: INR: 2.5

## 2014-10-15 NOTE — Patient Instructions (Signed)
INR at goal No changes Continue your regular dose of 7 mg daily except for 6 mg on Tues/Saturdays. Recheck INR in 1 month at [facility]  We will call you when we have results.

## 2014-10-15 NOTE — Progress Notes (Signed)
INR at goal *Telephone encounter - No charge* Received fax results from [facility] INR within range at 2.5 No unusual bleeding or bruising No medication or diet changes No missed or extra doses Plans: No changes Continue your regular dose of 7 mg daily except for 6 mg on Tues/Saturdays. Recheck INR in 1 month at [facility]  We will call you when we have results.

## 2014-11-11 DIAGNOSIS — F1920 Other psychoactive substance dependence, uncomplicated: Secondary | ICD-10-CM | POA: Diagnosis not present

## 2014-11-12 DIAGNOSIS — M47817 Spondylosis without myelopathy or radiculopathy, lumbosacral region: Secondary | ICD-10-CM | POA: Diagnosis not present

## 2014-11-12 DIAGNOSIS — M79651 Pain in right thigh: Secondary | ICD-10-CM | POA: Diagnosis not present

## 2014-11-12 DIAGNOSIS — M545 Low back pain: Secondary | ICD-10-CM | POA: Diagnosis not present

## 2014-11-12 DIAGNOSIS — G894 Chronic pain syndrome: Secondary | ICD-10-CM | POA: Diagnosis not present

## 2014-11-13 ENCOUNTER — Telehealth: Payer: Self-pay | Admitting: Pharmacist

## 2014-11-13 NOTE — Telephone Encounter (Signed)
Called pt & left voicemail to remind her to go have her protime checked at [place], Pharm.D., CPP 11/13/2014@3 :56 PM

## 2014-11-16 DIAGNOSIS — Z7901 Long term (current) use of anticoagulants: Secondary | ICD-10-CM | POA: Diagnosis not present

## 2014-11-16 DIAGNOSIS — C50919 Malignant neoplasm of unspecified site of unspecified female breast: Secondary | ICD-10-CM | POA: Diagnosis not present

## 2014-11-16 DIAGNOSIS — D6851 Activated protein C resistance: Secondary | ICD-10-CM | POA: Diagnosis not present

## 2014-12-09 DIAGNOSIS — I82622 Acute embolism and thrombosis of deep veins of left upper extremity: Secondary | ICD-10-CM | POA: Diagnosis not present

## 2014-12-09 DIAGNOSIS — D6851 Activated protein C resistance: Secondary | ICD-10-CM | POA: Diagnosis not present

## 2014-12-09 LAB — POCT INR: INR: 2.8

## 2014-12-10 ENCOUNTER — Ambulatory Visit: Payer: Self-pay | Admitting: Pharmacist

## 2014-12-10 LAB — POCT INR: INR: 2.8

## 2014-12-10 NOTE — Progress Notes (Signed)
**  Telephone encounter - no charge** Dr [name] pt.  INR=2.8 drawn at [facility].  No changes in meds.  No bleeding or bruising.  Ms Faller stated that she had an injection in her back and had stopped her coumadin 3 days prior, then resumed after injection.  No other medical changes to report.  Will continue current coumadin dose of 6mg  Tue/Sat and 7mg  other days.  Will check PT/INR around April 15th at [place].

## 2014-12-21 DIAGNOSIS — J701 Chronic and other pulmonary manifestations due to radiation: Secondary | ICD-10-CM | POA: Diagnosis not present

## 2014-12-21 DIAGNOSIS — K219 Gastro-esophageal reflux disease without esophagitis: Secondary | ICD-10-CM | POA: Diagnosis not present

## 2014-12-21 DIAGNOSIS — R918 Other nonspecific abnormal finding of lung field: Secondary | ICD-10-CM | POA: Diagnosis not present

## 2014-12-21 DIAGNOSIS — M4804 Spinal stenosis, thoracic region: Secondary | ICD-10-CM | POA: Diagnosis not present

## 2014-12-21 DIAGNOSIS — I517 Cardiomegaly: Secondary | ICD-10-CM | POA: Diagnosis not present

## 2014-12-21 DIAGNOSIS — E669 Obesity, unspecified: Secondary | ICD-10-CM | POA: Diagnosis not present

## 2014-12-21 DIAGNOSIS — Z7901 Long term (current) use of anticoagulants: Secondary | ICD-10-CM | POA: Diagnosis not present

## 2014-12-21 DIAGNOSIS — C50911 Malignant neoplasm of unspecified site of right female breast: Secondary | ICD-10-CM | POA: Diagnosis not present

## 2014-12-21 DIAGNOSIS — D6851 Activated protein C resistance: Secondary | ICD-10-CM | POA: Diagnosis not present

## 2014-12-21 DIAGNOSIS — F319 Bipolar disorder, unspecified: Secondary | ICD-10-CM | POA: Diagnosis not present

## 2014-12-21 DIAGNOSIS — I10 Essential (primary) hypertension: Secondary | ICD-10-CM | POA: Diagnosis not present

## 2014-12-21 DIAGNOSIS — M25511 Pain in right shoulder: Secondary | ICD-10-CM | POA: Diagnosis not present

## 2014-12-21 DIAGNOSIS — Z9013 Acquired absence of bilateral breasts and nipples: Secondary | ICD-10-CM | POA: Diagnosis not present

## 2014-12-21 DIAGNOSIS — Z923 Personal history of irradiation: Secondary | ICD-10-CM | POA: Diagnosis not present

## 2014-12-21 DIAGNOSIS — C50919 Malignant neoplasm of unspecified site of unspecified female breast: Secondary | ICD-10-CM | POA: Diagnosis not present

## 2014-12-21 DIAGNOSIS — Z0389 Encounter for observation for other suspected diseases and conditions ruled out: Secondary | ICD-10-CM | POA: Diagnosis not present

## 2014-12-21 DIAGNOSIS — M25512 Pain in left shoulder: Secondary | ICD-10-CM | POA: Diagnosis not present

## 2015-01-05 ENCOUNTER — Other Ambulatory Visit: Payer: Self-pay | Admitting: *Deleted

## 2015-01-05 ENCOUNTER — Encounter: Payer: Self-pay | Admitting: Pharmacist

## 2015-01-05 DIAGNOSIS — I82629 Acute embolism and thrombosis of deep veins of unspecified upper extremity: Secondary | ICD-10-CM

## 2015-01-05 DIAGNOSIS — D6859 Other primary thrombophilia: Secondary | ICD-10-CM

## 2015-01-05 MED ORDER — WARFARIN SODIUM 5 MG PO TABS: ORAL_TABLET | ORAL | Status: DC

## 2015-01-05 NOTE — Progress Notes (Signed)
[place] for refill authorization Coumadin 5mg  #30 UAD per CC 3 refills Coumain 1mg  #40 UAD per CC 3 refills  Spoke with Jenny Reichmann

## 2015-01-06 ENCOUNTER — Encounter: Payer: Self-pay | Admitting: Adult Health

## 2015-01-06 ENCOUNTER — Ambulatory Visit (INDEPENDENT_AMBULATORY_CARE_PROVIDER_SITE_OTHER): Payer: Medicare Other | Admitting: Adult Health

## 2015-01-06 ENCOUNTER — Other Ambulatory Visit (HOSPITAL_COMMUNITY)
Admission: RE | Admit: 2015-01-06 | Discharge: 2015-01-06 | Disposition: A | Discharge status: Home / Self Care | Payer: Medicare Other | Source: Ambulatory Visit | Attending: Adult Health | Admitting: Adult Health

## 2015-01-06 VITALS — BP 124/90 | HR 84 | Ht 62.0 in | Wt 219.0 lb

## 2015-01-06 DIAGNOSIS — Z1151 Encounter for screening for human papillomavirus (HPV): Secondary | ICD-10-CM | POA: Diagnosis not present

## 2015-01-06 DIAGNOSIS — Z8742 Personal history of other diseases of the female genital tract: Secondary | ICD-10-CM

## 2015-01-06 DIAGNOSIS — Z1212 Encounter for screening for malignant neoplasm of rectum: Secondary | ICD-10-CM | POA: Diagnosis not present

## 2015-01-06 DIAGNOSIS — Z01419 Encounter for gynecological examination (general) (routine) without abnormal findings: Secondary | ICD-10-CM | POA: Insufficient documentation

## 2015-01-06 DIAGNOSIS — Z853 Personal history of malignant neoplasm of breast: Secondary | ICD-10-CM

## 2015-01-06 HISTORY — DX: Personal history of other diseases of the female genital tract: Z87.42

## 2015-01-06 HISTORY — DX: Personal history of malignant neoplasm of breast: Z85.3

## 2015-01-06 LAB — HEMOCCULT GUIAC POC 1CARD (OFFICE): Fecal Occult Blood, POC: NEGATIVE

## 2015-01-06 NOTE — Patient Instructions (Signed)
Physical in 1 year 

## 2015-01-06 NOTE — Progress Notes (Signed)
Patient ID: Jessica Reilly, female   DOB: [birth year], 54 y.o.   MRN: [id number] History of Present Illness:  Jessica Reilly is a 54 year old white female in for well woman gyn exam and pap.  Current Medications, Allergies, Past Medical History, Past Surgical History, Family History and Social History were reviewed in [employer] record.     Review of Systems: Patient denies any headaches, hearing loss, fatigue, blurred vision, shortness of breath,  abdominal pain, problems with bowel movements, urination, or intercourse(not having sex). No joint pain or mood swings.Has chest pain at times and is seeing cardiologists next week.Has pain in back and shoulders and sees Dr Ace Gins.Has nodes in neck and sees ENT. Has had ovaries removed.She says brother in transplant list for liver and she is concerned about sister.   Physical Exam:BP 124/90 mmHg  Pulse 84  Ht 5\' 2"  (1.575 m)  Wt 219 lb (99.338 kg)  BMI 40.05 kg/m2  LMP  General:  Well developed, well nourished, no acute distress Skin:  Warm and dry Neck:  Midline trachea, normal thyroid, good ROM,has swollen cervical node on left. Lungs; Clear to auscultation bilaterally Breast:  Absent, sp mastectomy, well healed scars Cardiovascular: Regular rate and rhythm Abdomen:  Soft, non tender, no hepatosplenomegaly Pelvic:  External genitalia is normal in appearance, no lesions.  The vagina is pale with loss of moisture and rugae. Urethra has no lesions or masses. The cervix is smooth, pap with HPV performed.  Uterus is felt to be normal size, shape, and contour.  No adnexal masses or tenderness noted.Bladder is non tender, no masses felt. Rectal: Good sphincter tone, no polyps, or hemorrhoids felt.  Hemoccult negative. Extremities/musculoskeletal:  No swelling or varicosities noted, no clubbing or cyanosis Psych:  No mood changes, alert and cooperative,seems happy   Impression: Well woman gyn exam with pap History of breast  cancer History of abnormal pap   Plan: Physical in 1 year Labs with PCP Colonoscopy per GI

## 2015-01-07 LAB — CYTOLOGY - PAP

## 2015-01-14 DIAGNOSIS — R011 Cardiac murmur, unspecified: Secondary | ICD-10-CM | POA: Diagnosis not present

## 2015-01-14 DIAGNOSIS — R079 Chest pain, unspecified: Secondary | ICD-10-CM | POA: Diagnosis not present

## 2015-01-14 DIAGNOSIS — D6851 Activated protein C resistance: Secondary | ICD-10-CM | POA: Diagnosis not present

## 2015-01-14 DIAGNOSIS — I340 Nonrheumatic mitral (valve) insufficiency: Secondary | ICD-10-CM | POA: Diagnosis not present

## 2015-01-20 DIAGNOSIS — D6851 Activated protein C resistance: Secondary | ICD-10-CM | POA: Diagnosis not present

## 2015-01-20 DIAGNOSIS — I82622 Acute embolism and thrombosis of deep veins of left upper extremity: Secondary | ICD-10-CM | POA: Diagnosis not present

## 2015-01-21 ENCOUNTER — Ambulatory Visit: Payer: Medicare Other | Admitting: Pharmacist

## 2015-01-21 LAB — POCT INR: INR: 3

## 2015-01-21 NOTE — Progress Notes (Signed)
*  Telephone encounter - No charge* - Dr. [name] pt INR at goal today at 3 (goal 2-3) INR received from [facility] No issues to report No diet or medication changes No unusual bleeding or bruising No missed or extra doses Will keep current dose the same Plan: Continue Coumadin 7 mg daily except for 6 mg on Tues/Saturdays. Recheck INR in 1 month at [facility] around May 15  We will call you when we have results.

## 2015-01-21 NOTE — Patient Instructions (Signed)
INR at goal No changes Continue your regular dose of 7 mg daily except for 6 mg on Tues/Saturdays. Recheck INR in 1 month at [facility] around May 15  We will call you when we have results.

## 2015-02-19 ENCOUNTER — Telehealth: Payer: Self-pay | Admitting: Pharmacist

## 2015-02-19 NOTE — Telephone Encounter (Signed)
I called pt to remind her to have her INR checked at [facility].  She said she thought about it today but hasn't been. She started Prednisone taper on Wed 02/10/15 for leg swelling.  She said this is all that is able to help, esp w/ pain associated w/ a lot of walking when at beach recently.  She took 20 mg today and tomorrow will stop w/ 10 mg. She confirmed Coumadin dose still 7 mg daily except for 6 mg on Tues/Saturdays. No bleeding. She has a healing bruise on her leg.  She fell of her lawn mower. She will go have her INR drawn on Mon 02/22/15.  We'll call her w/ results when we receive them. Kennith Center, Pharm.D., CPP 02/19/2015@2 :27 PM

## 2015-02-22 ENCOUNTER — Ambulatory Visit: Payer: Medicare Other | Admitting: Pharmacist

## 2015-02-22 DIAGNOSIS — I82622 Acute embolism and thrombosis of deep veins of left upper extremity: Secondary | ICD-10-CM | POA: Diagnosis not present

## 2015-02-22 DIAGNOSIS — D6851 Activated protein C resistance: Secondary | ICD-10-CM | POA: Diagnosis not present

## 2015-02-22 LAB — POCT INR: INR: 1.1

## 2015-02-22 NOTE — Progress Notes (Signed)
**  Telephone encounter-do not charge** **Dr. [name] pt**  INR=1.1 today at [facility].  Ms Fedele completed a prednisone taper this weekend.  Known drug interaction with prednisone and coumadin to increase INR.  This is not the case today.  Ms Cantrelle states that she has not missed a dose of coumadin.  Her INR has been stable on current coumadin dose of 6mg  Tues/Sat 7mg  other days  for 1 yr.  This could be a lab error.  Ms Stenerson is at a low risk of clotting.  I have instructed Ms Overholt to increase coumadin slightly to 7mg  daily and go back to [facility] on Friday 5/20 to have INR checked.  Will call her with results.

## 2015-02-26 ENCOUNTER — Ambulatory Visit: Payer: Medicare Other | Admitting: Pharmacist

## 2015-02-26 DIAGNOSIS — I82622 Acute embolism and thrombosis of deep veins of left upper extremity: Secondary | ICD-10-CM | POA: Diagnosis not present

## 2015-02-26 LAB — POCT INR: INR: 1.9

## 2015-02-26 NOTE — Patient Instructions (Signed)
INR at goal no changes Return to taking 6mg  Tues/Sat and 7mg  other days Recheck INR around 03/24/15 at [facility].  We will call you when we have results.

## 2015-02-26 NOTE — Progress Notes (Signed)
*  Telephone Encounter - No Charge* - Dr. [name] Patient INR right at goal today at 1.9 (Goal 2-3) INR was 1.1 earlier this week (possible lab error?) Jessica Reilly took slightly more coumadin this week of 7 mg daily (usual dose is 6 mg on Tues/Sat and 7 mg all other days) No unusual bleeding or bruising No missed or extra doses No diet or medication changes Plans: Return to taking 6mg  Tues/Sat and 7mg  other days Recheck INR around 03/24/15 at [facility].  We will call you when we have results.

## 2015-03-02 DIAGNOSIS — Z1389 Encounter for screening for other disorder: Secondary | ICD-10-CM | POA: Diagnosis not present

## 2015-03-02 DIAGNOSIS — E782 Mixed hyperlipidemia: Secondary | ICD-10-CM | POA: Diagnosis not present

## 2015-03-02 DIAGNOSIS — Z6841 Body Mass Index (BMI) 40.0 and over, adult: Secondary | ICD-10-CM | POA: Diagnosis not present

## 2015-03-02 DIAGNOSIS — Z0000 Encounter for general adult medical examination without abnormal findings: Secondary | ICD-10-CM | POA: Diagnosis not present

## 2015-03-03 ENCOUNTER — Encounter: Payer: Self-pay | Admitting: Genetic Counselor

## 2015-03-03 ENCOUNTER — Ambulatory Visit (HOSPITAL_BASED_OUTPATIENT_CLINIC_OR_DEPARTMENT_OTHER): Payer: Medicare Other | Admitting: Genetic Counselor

## 2015-03-03 ENCOUNTER — Other Ambulatory Visit: Payer: Medicare Other

## 2015-03-03 DIAGNOSIS — Z315 Encounter for genetic counseling: Secondary | ICD-10-CM | POA: Diagnosis not present

## 2015-03-03 DIAGNOSIS — C50919 Malignant neoplasm of unspecified site of unspecified female breast: Secondary | ICD-10-CM

## 2015-03-03 DIAGNOSIS — Z800 Family history of malignant neoplasm of digestive organs: Secondary | ICD-10-CM | POA: Diagnosis not present

## 2015-03-03 DIAGNOSIS — Z8041 Family history of malignant neoplasm of ovary: Secondary | ICD-10-CM

## 2015-03-03 DIAGNOSIS — Z803 Family history of malignant neoplasm of breast: Secondary | ICD-10-CM | POA: Diagnosis not present

## 2015-03-03 NOTE — Progress Notes (Signed)
Patient Name: Jessica Reilly Patient Age: 54 y.o. Encounter Date: 03/03/2015  Referring Physician: Mardelle Matte, MD  Primary Care Provider: Purvis Kilts, MD   Ms. ROBINN Reilly, a 54 y.o. female, is being seen at the [facility] due to a personal and family history of cancer. She presents to clinic today with her cousin who is also being seen to discuss the possibility of a hereditary predisposition to cancer and discuss whether genetic testing is warranted.  HISTORY OF PRESENT ILLNESS: Ms. Jessica Reilly was diagnosed with breast cancer at the age of 45. She is s/p bilateral mastectomies as well as BSO.  The breast tumor was ER positive.  She reportedly had negative BRCA1/BRCA2 testing, but we do not have the results to review today.  [name] reports that she has had a total of 4 colonoscopies and that polyps were removed each time. She does not know the # or type of polyps she had.  Past Medical History  Diagnosis Date  . HTN (hypertension)   . Acid reflux   . High cholesterol   . HX: breast cancer   . Gallstones   . Kidney stones   . Fibroid   . Heart murmur   . Osteoarthritis   . Factor V Leiden   . Coagulopathy 04/10/2012  . Factor V Leiden mutation 04/10/2012  . DVT of upper extremity (deep vein thrombosis) 04/10/2012  . Lobular carcinoma of breast, estrogen receptor positive, 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  . Abnormal Pap smear   . Mental disorder     panic attacks  . Constipation   . Pain in both knees   . Hypertension 12/31/2012  . Chronic anticoagulation 03/22/2013  . Bipolar disorder 03/22/2013  . Vaginal Pap smear, abnormal   . Breast disorder     cancer  . History of breast cancer 01/06/2015  . History of abnormal cervical Pap smear 01/06/2015  . Breast cancer   . Family history of ovarian cancer     Past Surgical History  Procedure Laterality Date  . Mastectomy  bilateral  . Breast lumpectomy    . Ovaries removed    . Tonsillectomy and  adenoidectomy    . Lymph node biopsy    . Colonoscopy with esophagogastroduodenoscopy (egd) N/A 07/23/2013    Procedure: COLONOSCOPY WITH ESOPHAGOGASTRODUODENOSCOPY (EGD);  Surgeon: Rogene Houston, MD;  Location: [facility];  Service: Endoscopy;  Laterality: N/A;  125    History   Social History  . Marital Status: Divorced    Spouse Name: N/A  . Number of Children: N/A  . Years of Education: N/A   Social History Main Topics  . Smoking status: Never Smoker   . Smokeless tobacco: Never Used  . Alcohol Use: No  . Drug Use: No  . Sexual Activity: No   Other Topics Concern  . Not on file   Social History Narrative     FAMILY HISTORY:   During the visit, a 4-generation pedigree was obtained. Family tree will be sent for scanning and will be in EPIC under the Media tab.  Significant diagnoses include the following:  Family History  Problem Relation Age of Onset  . Alcohol abuse Mother   . COPD Mother   . Alcohol abuse Father   . Heart disease Father   . Pneumonia Father   . Diabetes Father   . Colon cancer Father     Dx 70s; deceased 64s  . OCD Daughter   . Bipolar disorder Daughter   .  Hypertension Daughter   . Schizophrenia Daughter   . Schizophrenia Daughter   . Bipolar disorder Daughter   . OCD Daughter   . Hypertension Daughter   . COPD Daughter   . Other Daughter     stomach don't empty out  . ADD / ADHD Grandchild   . Anxiety disorder Grandchild   . Depression Grandchild   . Healthy Sister   . Cancer Brother     leukemia 14s; deceased 66  . Heart disease Brother   . Cancer Brother     liver; currently 15  . Cirrhosis Brother   . Other Brother     waiting for liver transplant  . Alzheimer's disease Paternal Grandmother   . Ovarian cancer Maternal Grandmother 69    deceased 30  . Cancer Maternal Uncle     3 of 6 with various cancers: skin, NHL, Hodkin's Dx  . Breast cancer Cousin 72    mat first cousin related through unaffected aunt     Additionally, Ms. Jessica Reilly has two daughters. She has one sister (age 90). Her mother died at 101 due to COPD. Her mother had 3 sisters and 6 brothers. Her father had 2 sisters and a brother and very little is known about this side of the family.  Ms. Grosso ancestry is Caucasian - NOS. There is no known Jewish ancestry and no consanguinity.  ASSESSMENT AND PLAN: [name] is a 55 y.o. female with a personal history of breast cancer at age 72 and colon polyps as well as a family history of breast, ovarian and colon cancers as noted above. Given that her mother is the link to the ovarian cancer in her maternal grandmother and her mother had no cancer, she is not at very high risk to harbor a mutation. It is unclear how many polyps she's had, however, and her father had colon cancers. For this reason and due to her own breast cancer, genetic testing is recommended using an expanded panel. We reviewed the characteristics, features and inheritance patterns of hereditary cancer syndromes. We also discussed the process of testing, insurance coverage and implications of results. A negative result will be overall reassuring.  Ms. Tancredi wished to pursue genetic testing and a blood sample will be sent to [facility] for analysis of the 32 genes on the CancerNext gene panel (APC, ATM, BARD1, BRCA1, BRCA2, BRIP1, BMPR1A, CDH1, CDK4, CDKN2A, CHEK2, EPCAM, GREM1, MLH1, MRE11A, MSH2, MSH6, MUTYH, NBN, NF1, PALB2, PMS2, POLD1, POLE, PTEN, RAD50, RAD51C, RAD51D, SMAD4, SMARCA4, STK11, and TP53). This panel was chosen to combine the hereditary breast/ovarian genes with the colorectal genes. We discussed the implications of a positive, negative and/ or Variant of Uncertain Significance (VUS) result. Results should be available in approximately 4 weeks, at which point we will contact her and address implications for her as well as address genetic testing for at-risk family members, if needed.    We encouraged Ms.  Pucillo to remain in contact with Cancer Genetics annually so that we can update the family history and inform her of any changes in cancer genetics and testing that may be of benefit for this family. Ms.  Scherer questions were answered to her satisfaction today.   Thank you for the referral and allowing [name] to share in the care of your patient.   The patient was seen for a total of 30 minutes, greater than 50% of which was spent face-to-face counseling. This patient was discussed with the overseeing provider who agrees with  the above.   Steele Berg, MS, [place] Certified Genetic Counselor phone: [phone] [email or web address]

## 2015-03-20 DIAGNOSIS — F419 Anxiety disorder, unspecified: Secondary | ICD-10-CM | POA: Diagnosis not present

## 2015-03-20 DIAGNOSIS — F23 Brief psychotic disorder: Secondary | ICD-10-CM | POA: Diagnosis not present

## 2015-03-25 ENCOUNTER — Telehealth: Payer: Self-pay | Admitting: Pharmacist

## 2015-03-25 ENCOUNTER — Encounter: Payer: Self-pay | Admitting: Genetic Counselor

## 2015-03-25 DIAGNOSIS — Z1379 Encounter for other screening for genetic and chromosomal anomalies: Secondary | ICD-10-CM | POA: Insufficient documentation

## 2015-03-25 NOTE — Telephone Encounter (Signed)
Have not received a fax of Jessica Reilly INR results yet this week. Left VM asking her to call and let us know if labs were drawn or when she planned to have them drawn.

## 2015-03-25 NOTE — Progress Notes (Signed)
GENETIC TEST RESULTS  Patient Name: Jessica Reilly Patient Age: 54 y.o. Encounter Date: 03/25/2015  Referring Physician: Sharilyn Sites, MD   [name] was called today to discuss genetic test results. Please see the Genetics note from her visit on 03/03/15 for a detailed discussion of her personal and family history.  GENETIC TESTING: At the time of [name] visit, we recommended she pursue genetic testing of multiple genes on the CancerNext gene panel. This test, which included sequencing and deletion/duplication analysis of 32 genes, was performed at [facility]. Testing was normal and did not reveal a mutation in these genes. The genes tested were APC, ATM, BARD1, BRCA1, BRCA2, BRIP1, BMPR1A, CDH1, CDK4, CDKN2A, CHEK2, EPCAM, GREM1, MLH1, MRE11A, MSH2, MSH6, MUTYH, NBN, NF1, PALB2, PMS2, POLD1, POLE, PTEN, RAD50, RAD51C, RAD51D, SMAD4, SMARCA4, STK11, and TP53.  We discussed with [name] that since the current test is not perfect, it is possible there may be a gene mutation that current testing cannot detect, but that chance is small. We also discussed that it is possible that a different genetic factor, which was not part of this testing or has not yet been discovered, is responsible for the cancer diagnoses in the family. Should [name] wish to discuss or pursue this additional testing, we are happy to coordinate this at any time, but do not feel that she is at significant risk of harboring a mutation in a different gene.     CANCER SCREENING: This result suggests that [name]'s cancer was most likely not due to an inherited predisposition. Most cancers happen by chance and this negative test, along with details of her family history, suggests that her cancer falls into this category. We, therefore, recommended she continue to follow the cancer screening guidelines provided by her physician.   FAMILY MEMBERS: Women in the family are at some increased risk of developing  breast cancer, over the general population risk, simply due to the family history. We recommended they have a yearly mammogram beginning at age 70, which is 10 years earlier than the youngest breast cancer diagnosis, a yearly clinical breast exam, and perform monthly breast self-exams. A gynecologic exam is recommended yearly. Colon cancer screening is recommended to begin by age 35.  Lastly, we discussed with [name] that cancer genetics is a rapidly advancing field and it is possible that new genetic tests will be appropriate for her in the future. We encouraged her to remain in contact with [name] on an annual basis so we can update her personal and family histories, and let her know of advances in cancer genetics that may benefit the family. Our contact number was provided. [name] questions were answered to her satisfaction today, and she knows she is welcome to call anytime with additional questions.    Steele Berg, MS, [place] Certified Genetic Counselor phone: [phone] [email or web address]

## 2015-04-01 DIAGNOSIS — D689 Coagulation defect, unspecified: Secondary | ICD-10-CM | POA: Diagnosis not present

## 2015-04-01 DIAGNOSIS — I82629 Acute embolism and thrombosis of deep veins of unspecified upper extremity: Secondary | ICD-10-CM | POA: Diagnosis not present

## 2015-04-02 ENCOUNTER — Ambulatory Visit: Payer: Medicare Other | Admitting: Pharmacist

## 2015-04-02 LAB — POCT INR: INR: 2.9

## 2015-04-02 NOTE — Progress Notes (Signed)
INR at goal today at 2.9 Goal 2-3) *No charge - Telephone encounter - Jessica Reilly patient* Pt is doing well with no complaints Received fax results from [place] No missed or extra doses No diet or medication changes No unusual bleeding or bruising Pt states she may schedule another back injection in the near future but will let us know. Last time she stopped her coumadin for 3 days prior to procedure (low risk of clotting) Plan for now No changes Continue taking 6mg  Tues/Sat and 7mg  other days Recheck INR around 04/30/15 at [facility].  We will call you when we have results.

## 2015-04-02 NOTE — Patient Instructions (Signed)
INR at goal No changes Continue taking 6mg  Tues/Sat and 7mg  other days Recheck INR around 04/30/15 at [facility].  We will call you when we have results.

## 2015-04-28 ENCOUNTER — Ambulatory Visit: Payer: Medicare Other | Admitting: Pharmacist

## 2015-04-28 DIAGNOSIS — D689 Coagulation defect, unspecified: Secondary | ICD-10-CM | POA: Diagnosis not present

## 2015-04-28 DIAGNOSIS — I82629 Acute embolism and thrombosis of deep veins of unspecified upper extremity: Secondary | ICD-10-CM | POA: Diagnosis not present

## 2015-04-28 LAB — POCT INR: INR: 2.5

## 2015-04-28 NOTE — Progress Notes (Signed)
INR at goal today at 2.5 (goal 2-3) *Telephone Note - No charge* - Dr. [name] Pt is doing well with no complaints No missed or extra doses No bleeding or bruising noted No medication or diet changes Pt is scheduled to see Dr. [name] at the end of September No changes Continue taking 6mg  Tues/Sat and 7mg  other days Recheck INR around 05/26/15 at [facility].  We will call you when we have results.

## 2015-04-28 NOTE — Patient Instructions (Signed)
INR at goal No changes Continue taking 6mg  Tues/Sat and 7mg  other days Recheck INR around 05/26/15 at [facility].  We will call you when we have results.

## 2015-04-29 ENCOUNTER — Telehealth: Payer: Self-pay | Admitting: Genetic Counselor

## 2015-04-29 NOTE — Telephone Encounter (Signed)
Patient had genetic testing through [facility].  She asked that we fax her results to Dr. Cecilio Asper at [facility] for her appointment on July 25. Fax number is [phone]

## 2015-05-03 DIAGNOSIS — Z9013 Acquired absence of bilateral breasts and nipples: Secondary | ICD-10-CM | POA: Diagnosis not present

## 2015-05-03 DIAGNOSIS — K1120 Sialoadenitis, unspecified: Secondary | ICD-10-CM | POA: Diagnosis not present

## 2015-05-03 DIAGNOSIS — C50912 Malignant neoplasm of unspecified site of left female breast: Secondary | ICD-10-CM | POA: Diagnosis not present

## 2015-05-03 DIAGNOSIS — R599 Enlarged lymph nodes, unspecified: Secondary | ICD-10-CM | POA: Diagnosis not present

## 2015-05-24 ENCOUNTER — Ambulatory Visit: Payer: Medicare Other | Admitting: Pharmacist

## 2015-05-24 DIAGNOSIS — I82629 Acute embolism and thrombosis of deep veins of unspecified upper extremity: Secondary | ICD-10-CM | POA: Diagnosis not present

## 2015-05-24 DIAGNOSIS — D689 Coagulation defect, unspecified: Secondary | ICD-10-CM | POA: Diagnosis not present

## 2015-05-24 LAB — POCT INR: INR: 1.8

## 2015-05-24 NOTE — Progress Notes (Signed)
*  No charge - telephone encounter* Dr. [name] pt INR just below goal today at 1.8 (goal 2-3) Pt is doing well with no complaints INR drawn at [facility] to patient over the phone No missed or extra doses No diet or medication changes No unusual bleeding or bruising Pt has been stable on current dose will make no changes Plan: Continue taking 6mg  Tues/Sat and 7mg  other days Recheck INR around 07/05/15 at [facility].  We will call you when we have results. After 9/26 INR check patient will transition care over to Dr. [name]. Pt will be seen by Dr. [name] on 9/27 and plans can be discussed at this office visit

## 2015-05-24 NOTE — Patient Instructions (Signed)
INR just below goal No Changes Continue taking 6mg  Tues/Sat and 7mg  other days Recheck INR around 07/05/15 at [facility].  We will call you when we have results.

## 2015-05-28 DIAGNOSIS — K219 Gastro-esophageal reflux disease without esophagitis: Secondary | ICD-10-CM | POA: Diagnosis not present

## 2015-05-28 DIAGNOSIS — Z853 Personal history of malignant neoplasm of breast: Secondary | ICD-10-CM | POA: Diagnosis not present

## 2015-05-28 DIAGNOSIS — C50919 Malignant neoplasm of unspecified site of unspecified female breast: Secondary | ICD-10-CM | POA: Diagnosis not present

## 2015-05-28 DIAGNOSIS — Z7901 Long term (current) use of anticoagulants: Secondary | ICD-10-CM | POA: Diagnosis not present

## 2015-05-28 DIAGNOSIS — M8580 Other specified disorders of bone density and structure, unspecified site: Secondary | ICD-10-CM | POA: Diagnosis not present

## 2015-05-28 DIAGNOSIS — I10 Essential (primary) hypertension: Secondary | ICD-10-CM | POA: Diagnosis not present

## 2015-05-28 DIAGNOSIS — F319 Bipolar disorder, unspecified: Secondary | ICD-10-CM | POA: Diagnosis not present

## 2015-05-28 DIAGNOSIS — Z9013 Acquired absence of bilateral breasts and nipples: Secondary | ICD-10-CM | POA: Diagnosis not present

## 2015-05-28 DIAGNOSIS — E669 Obesity, unspecified: Secondary | ICD-10-CM | POA: Diagnosis not present

## 2015-05-28 DIAGNOSIS — Z9221 Personal history of antineoplastic chemotherapy: Secondary | ICD-10-CM | POA: Diagnosis not present

## 2015-05-28 DIAGNOSIS — Z780 Asymptomatic menopausal state: Secondary | ICD-10-CM | POA: Diagnosis not present

## 2015-05-28 DIAGNOSIS — Z90722 Acquired absence of ovaries, bilateral: Secondary | ICD-10-CM | POA: Diagnosis not present

## 2015-05-28 DIAGNOSIS — Z08 Encounter for follow-up examination after completed treatment for malignant neoplasm: Secondary | ICD-10-CM | POA: Diagnosis not present

## 2015-05-28 DIAGNOSIS — M810 Age-related osteoporosis without current pathological fracture: Secondary | ICD-10-CM | POA: Diagnosis not present

## 2015-05-28 DIAGNOSIS — Z923 Personal history of irradiation: Secondary | ICD-10-CM | POA: Diagnosis not present

## 2015-05-28 DIAGNOSIS — M899 Disorder of bone, unspecified: Secondary | ICD-10-CM | POA: Diagnosis not present

## 2015-05-28 DIAGNOSIS — Z8781 Personal history of (healed) traumatic fracture: Secondary | ICD-10-CM | POA: Diagnosis not present

## 2015-05-28 DIAGNOSIS — Z79899 Other long term (current) drug therapy: Secondary | ICD-10-CM | POA: Diagnosis not present

## 2015-05-28 DIAGNOSIS — Z79811 Long term (current) use of aromatase inhibitors: Secondary | ICD-10-CM | POA: Diagnosis not present

## 2015-06-07 ENCOUNTER — Encounter: Payer: Self-pay | Admitting: Pharmacist

## 2015-06-07 NOTE — Progress Notes (Signed)
Jessica Reilly at [facility] to refill meds Coumadin 1mg  #60 with 3 refills  Coumadin 5mg  #30 with 3 refills  Both as directed by coumadin clinic

## 2015-06-21 ENCOUNTER — Other Ambulatory Visit: Payer: Self-pay

## 2015-07-05 ENCOUNTER — Ambulatory Visit (INDEPENDENT_AMBULATORY_CARE_PROVIDER_SITE_OTHER): Payer: Self-pay | Admitting: Pharmacist

## 2015-07-05 DIAGNOSIS — D6852 Prothrombin gene mutation: Secondary | ICD-10-CM

## 2015-07-05 DIAGNOSIS — I82629 Acute embolism and thrombosis of deep veins of unspecified upper extremity: Secondary | ICD-10-CM | POA: Diagnosis not present

## 2015-07-05 DIAGNOSIS — D689 Coagulation defect, unspecified: Secondary | ICD-10-CM | POA: Diagnosis not present

## 2015-07-05 LAB — POCT INR: INR: 2.9

## 2015-07-05 NOTE — Progress Notes (Signed)
INR = 2.9; fax received (drawn at [facility] in [place] on [address] location; contact Kayla ph# [phone]; fax# [phone]) NO CHARGE - Phone Encounter Current Coumadin dose: 7 mg/day; 6 mg Tues/Sat. No complaints re: anticoag.  No missed / extra doses. No recent med changes. She sees Dr. [name] tomorrow (9/27) at [facility].  I informed staff at [facility] that they will need to fax today and future INR results to Dr. [name] at [facility] clinic (gave fax [phone]) INR at goal.  I instructed pt to continue same dose of Coumadin w/o change. Follow up from this point w/ Dr. [name] at frequency he feels is necessary. FINAL [facility] COUMADIN CLINIC ENCOUNTER.

## 2015-07-06 ENCOUNTER — Ambulatory Visit (INDEPENDENT_AMBULATORY_CARE_PROVIDER_SITE_OTHER): Payer: Medicare Other | Admitting: Oncology

## 2015-07-06 ENCOUNTER — Encounter: Payer: Self-pay | Admitting: Oncology

## 2015-07-06 VITALS — BP 137/83 | HR 74 | Temp 97.7°F | Ht 62.0 in | Wt 216.7 lb

## 2015-07-06 DIAGNOSIS — Z7901 Long term (current) use of anticoagulants: Secondary | ICD-10-CM | POA: Diagnosis not present

## 2015-07-06 DIAGNOSIS — F319 Bipolar disorder, unspecified: Secondary | ICD-10-CM | POA: Diagnosis not present

## 2015-07-06 DIAGNOSIS — C50912 Malignant neoplasm of unspecified site of left female breast: Secondary | ICD-10-CM

## 2015-07-06 DIAGNOSIS — Z170 Estrogen receptor positive status [ER+]: Secondary | ICD-10-CM

## 2015-07-06 DIAGNOSIS — Z853 Personal history of malignant neoplasm of breast: Secondary | ICD-10-CM

## 2015-07-06 DIAGNOSIS — D6851 Activated protein C resistance: Secondary | ICD-10-CM | POA: Diagnosis not present

## 2015-07-06 DIAGNOSIS — I82623 Acute embolism and thrombosis of deep veins of upper extremity, bilateral: Secondary | ICD-10-CM

## 2015-07-06 NOTE — Patient Instructions (Signed)
Make new patient appointment for Internal Medicine Clinic Coumadin Clinic Change frequency of lab work to every 3 months: continue to have blood drawn at [facility] on [address] in [place] We will send new orders and number where they can fax the results: care of Dr Jorene Guest fax [phone] Return visit with Dr Darnell Level in 1 year

## 2015-07-07 NOTE — Progress Notes (Signed)
Patient ID: Jessica Reilly, female   DOB: November 19, 1960, 54 y.o.   MRN: [id number] Hematology and Oncology Follow Up Visit  Jessica Reilly [id number] 04-15-1961 54 y.o. 07/07/2015 4:17 PM   Principle Diagnosis: Encounter Diagnoses  Name Primary?  . Factor V Leiden mutation Yes  . DVT of upper extremity (deep vein thrombosis), bilateral   . Lobular carcinoma of breast, stage 2, estrogen receptor positive, left   . Chronic anticoagulation   Clinical Summary: 54 year old woman with a history of a stage II, T2 N0, ER/PR positive, HER-2 negative cancer of the right breast status post right modified radical mastectomy and elective simple left mastectomy with subsequent bilateral oophorectomy. She received postoperative radiation to the right chest wall, axilla, and supraclavicular area. She sustained a left upper extremity DVT following left breast surgery. She tested negative for the BRCA1 and BRCA 2 genes by her history.  She received neoadjuvant CEF chemotherapy for 5 cycles. Initial tumor measured 4.1 x 3.1 x 2.7 cm on mammogram done 06/25/2007. She had her surgery at [facility] in [place].  Final tumor size was 1.7 cm. Grade 3. 0 of 3 sentinel lymph nodes tested negative. She had very poor tolerance to the treatments. She was then put on hormonal therapy with Arimidex.  Due to the perioperative left upper extremity DVT (as noted surgery on the right,) lab testing was done and she was found to be a heterozygote for the factor V Leiden gene mutation. She was anticoagulated and she has been kept on therapeutic Coumadin.  Although the risk of thrombosis on an aromatase inhibitor is less then on tamoxifen, given her history, I felt it was reasonable to continue the Coumadin until she completes a planned 5 year course of Arimidex in September 2014. She has elected to have her Coumadin monitored through our office.   Her oncologist at [facility] Dr. Mercie Eon felt that the Arimidex  hormonal therapy should be continued past 5 years.    Interim History:   She is doing well. She has had no interim medical problems. Routine aches and pains but no new or progressive bone pain. No headache or change in vision. No cough or dyspnea. No new swelling of her left arm. No vaginal bleeding. No change in bowel habit.  Medications: reviewed  Allergies:  Allergies  Allergen Reactions  . Amoxicillin-Pot Clavulanate Rash  . Ceftin Rash  . Cefuroxime Rash  . Cefuroxime Axetil Rash  . Risperidone And Related Other (See Comments)    Has medical contraindications    Review of Systems: See history of present illness  Remaining ROS negative:   Physical Exam: Blood pressure 137/83, pulse 74, temperature 97.7 F (36.5 C), temperature source Oral, height _0  (1.575 m), weight 216 lb 11.2 oz (98.294 kg), SpO2 98 %. Wt Readings from Last 3 Encounters:  07/06/15 216 lb 11.2 oz (98.294 kg)  01/06/15 219 lb (99.338 kg)  06/22/14 220 lb 1.6 oz (99.837 kg)     General appearance: Well-nourished Caucasian woman. HENNT: Pharynx no erythema, exudate, mass, or ulcer. No thyromegaly or thyroid nodules Lymph nodes: No cervical, supraclavicular, or axillary lymphadenopathy Breasts: Bilateral mastectomies. Lungs: Clear to auscultation, resonant to percussion throughout Heart: Regular rhythm, no murmur, no gallop, no rub, no click, no edema Abdomen: Soft, nontender, normal bowel sounds, no mass, no organomegaly Extremities: No edema, no calf tenderness. No swelling of the left or right upper extremities. Musculoskeletal: no joint deformities GU:  Vascular: Carotid pulses 2+, no bruits, distal  pulses: Dorsalis pedis 1+ symmetric Neurologic: Alert, oriented, PERRLA,  cranial nerves grossly normal, motor strength 5 over 5, reflexes 1+ symmetric, upper body coordination normal, gait normal, Skin: No rash or ecchymosis  Lab Results: CBC W/Diff    Component Value Date/Time   WBC 8.2  08/26/2014 1559   WBC 7.4 02/20/2013 1342   RBC 4.35 08/26/2014 1559   RBC 4.38 02/20/2013 1342   HGB 12.9 08/26/2014 1559   HGB 12.9 02/20/2013 1342   HCT 38.2 08/26/2014 1559   HCT 38.8 02/20/2013 1342   PLT 265 08/26/2014 1559   PLT 275 02/20/2013 1342   MCV 87.8 08/26/2014 1559   MCV 88.6 02/20/2013 1342   MCH 29.7 08/26/2014 1559   MCH 29.5 02/20/2013 1342   MCHC 33.8 08/26/2014 1559   MCHC 33.2 02/20/2013 1342   RDW 15.3 08/26/2014 1559   RDW 13.9 02/20/2013 1342   LYMPHSABS 2.8 08/26/2014 1559   LYMPHSABS 2.9 02/20/2013 1342   MONOABS 0.4 08/26/2014 1559   MONOABS 0.3 02/20/2013 1342   EOSABS 0.2 08/26/2014 1559   EOSABS 0.2 02/20/2013 1342   BASOSABS 0.1 08/26/2014 1559   BASOSABS 0.0 02/20/2013 1342     Chemistry      Component Value Date/Time   NA 141 06/22/2014 1043   NA 142 02/20/2013 1342   K 4.3 06/22/2014 1043   K 4.4 02/20/2013 1342   CL 101 06/22/2014 1043   CL 105 02/20/2013 1342   CO2 26 06/22/2014 1043   CO2 26 02/20/2013 1342   BUN 17 06/22/2014 1043   BUN 10.0 02/20/2013 1342   CREATININE 1.06 06/22/2014 1043   CREATININE 1.2* 02/20/2013 1342   CREATININE 1.07 01/03/2013 2217      Component Value Date/Time   CALCIUM 9.2 06/22/2014 1043   CALCIUM 9.5 02/20/2013 1342   ALKPHOS 90 06/22/2014 1043   ALKPHOS 100 02/20/2013 1342   AST 16 06/22/2014 1043   AST 29 02/20/2013 1342   ALT 20 06/22/2014 1043   ALT 33 02/20/2013 1342   BILITOT 0.3 06/22/2014 1043   BILITOT 0.37 02/20/2013 1342    INR 2.9 on Coumadin 7 mg daily except 6 mg on Saturdays done 07/05/2015.   Radiological Studies: No results found.  Impression:  #1. 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  Status post isolated episode of left  upper extremity thrombosis following elective, left, simple mastectomy.  Ongoing hormonal therapy with Arimidex which has a low thrombogenic potential.  We had an identical discussion that we had when  I saw her for the last 2  years. I reviewed with the patient again today that heterozygote status for the 5 Leiden gene mutation is a minor risk factor for clotting. However, she would prefer to stay on Coumadin and not use alternative anticoagulants or aspirin for ongoing thromboprophylaxis. We will continue to monitor her Coumadin locally.   #2. T2, N0, M0 stage II, ER also did, HER-2 negative, grade 3, cancer of the right breast. Treated as outlined above.  She remains free of any obvious new disease now out  8 years from initial diagnosis.  We now have the 10 year data on extension of aromatase inhibitors. This was presented at our May 2016 national oncology meeting. There is a very modest approximate 2% improvement in relapse free survival by extending aromatase inhibitors an additional 5 years. Given her very low risk of recurrence I told her she could stop her Arimidex at this time. She told me that  she wants every advantage and will continue for another 2 years to complete 10 years of adjuvant therapy.  #3. Bipolar disorder  Recommendation of psychiatrist who evaluated her was to go on Lexapro but she has not done this. She is using when necessary Xanax.   CC: Patient Care Team: Sharilyn Sites, MD as PCP - General (Family Medicine)   Annia Belt, MD 9/28/20164:17 PM

## 2015-07-12 DIAGNOSIS — Z9221 Personal history of antineoplastic chemotherapy: Secondary | ICD-10-CM | POA: Diagnosis not present

## 2015-07-12 DIAGNOSIS — C50511 Malignant neoplasm of lower-outer quadrant of right female breast: Secondary | ICD-10-CM | POA: Diagnosis not present

## 2015-07-12 DIAGNOSIS — F3181 Bipolar II disorder: Secondary | ICD-10-CM | POA: Diagnosis not present

## 2015-07-12 DIAGNOSIS — M2550 Pain in unspecified joint: Secondary | ICD-10-CM | POA: Diagnosis not present

## 2015-07-12 DIAGNOSIS — M170 Bilateral primary osteoarthritis of knee: Secondary | ICD-10-CM | POA: Diagnosis not present

## 2015-07-12 DIAGNOSIS — G8929 Other chronic pain: Secondary | ICD-10-CM | POA: Diagnosis not present

## 2015-07-12 DIAGNOSIS — M545 Low back pain: Secondary | ICD-10-CM | POA: Diagnosis not present

## 2015-07-12 DIAGNOSIS — M8580 Other specified disorders of bone density and structure, unspecified site: Secondary | ICD-10-CM | POA: Diagnosis not present

## 2015-07-12 DIAGNOSIS — Z79899 Other long term (current) drug therapy: Secondary | ICD-10-CM | POA: Diagnosis not present

## 2015-07-12 DIAGNOSIS — M25511 Pain in right shoulder: Secondary | ICD-10-CM | POA: Diagnosis not present

## 2015-07-12 DIAGNOSIS — M810 Age-related osteoporosis without current pathological fracture: Secondary | ICD-10-CM | POA: Diagnosis not present

## 2015-07-12 DIAGNOSIS — F319 Bipolar disorder, unspecified: Secondary | ICD-10-CM | POA: Diagnosis not present

## 2015-07-12 DIAGNOSIS — Z923 Personal history of irradiation: Secondary | ICD-10-CM | POA: Diagnosis not present

## 2015-07-12 DIAGNOSIS — D6851 Activated protein C resistance: Secondary | ICD-10-CM | POA: Diagnosis not present

## 2015-07-19 ENCOUNTER — Ambulatory Visit: Payer: Self-pay

## 2015-07-22 DIAGNOSIS — G8929 Other chronic pain: Secondary | ICD-10-CM | POA: Diagnosis not present

## 2015-07-22 DIAGNOSIS — M25511 Pain in right shoulder: Secondary | ICD-10-CM | POA: Diagnosis not present

## 2015-07-22 DIAGNOSIS — M8580 Other specified disorders of bone density and structure, unspecified site: Secondary | ICD-10-CM | POA: Diagnosis not present

## 2015-08-09 DIAGNOSIS — Z9013 Acquired absence of bilateral breasts and nipples: Secondary | ICD-10-CM | POA: Diagnosis not present

## 2015-08-09 DIAGNOSIS — M75101 Unspecified rotator cuff tear or rupture of right shoulder, not specified as traumatic: Secondary | ICD-10-CM | POA: Diagnosis not present

## 2015-08-09 DIAGNOSIS — R413 Other amnesia: Secondary | ICD-10-CM | POA: Diagnosis not present

## 2015-08-09 DIAGNOSIS — M19011 Primary osteoarthritis, right shoulder: Secondary | ICD-10-CM | POA: Diagnosis not present

## 2015-08-09 DIAGNOSIS — M87021 Idiopathic aseptic necrosis of right humerus: Secondary | ICD-10-CM | POA: Diagnosis not present

## 2015-08-09 DIAGNOSIS — D6851 Activated protein C resistance: Secondary | ICD-10-CM | POA: Diagnosis not present

## 2015-08-09 DIAGNOSIS — Z79899 Other long term (current) drug therapy: Secondary | ICD-10-CM | POA: Diagnosis not present

## 2015-08-09 DIAGNOSIS — Z23 Encounter for immunization: Secondary | ICD-10-CM | POA: Diagnosis not present

## 2015-08-09 DIAGNOSIS — M25511 Pain in right shoulder: Secondary | ICD-10-CM | POA: Diagnosis not present

## 2015-08-09 DIAGNOSIS — M659 Synovitis and tenosynovitis, unspecified: Secondary | ICD-10-CM | POA: Diagnosis not present

## 2015-08-09 DIAGNOSIS — Z7901 Long term (current) use of anticoagulants: Secondary | ICD-10-CM | POA: Diagnosis not present

## 2015-08-09 DIAGNOSIS — C50111 Malignant neoplasm of central portion of right female breast: Secondary | ICD-10-CM | POA: Diagnosis not present

## 2015-08-09 DIAGNOSIS — G8929 Other chronic pain: Secondary | ICD-10-CM | POA: Diagnosis not present

## 2015-08-10 DIAGNOSIS — Z853 Personal history of malignant neoplasm of breast: Secondary | ICD-10-CM | POA: Diagnosis not present

## 2015-08-10 DIAGNOSIS — M25511 Pain in right shoulder: Secondary | ICD-10-CM | POA: Diagnosis not present

## 2015-08-10 DIAGNOSIS — Z923 Personal history of irradiation: Secondary | ICD-10-CM | POA: Diagnosis not present

## 2015-08-10 DIAGNOSIS — M87021 Idiopathic aseptic necrosis of right humerus: Secondary | ICD-10-CM | POA: Diagnosis not present

## 2015-08-10 DIAGNOSIS — G8929 Other chronic pain: Secondary | ICD-10-CM | POA: Diagnosis not present

## 2015-08-10 DIAGNOSIS — M75121 Complete rotator cuff tear or rupture of right shoulder, not specified as traumatic: Secondary | ICD-10-CM | POA: Diagnosis not present

## 2015-08-10 DIAGNOSIS — Z9221 Personal history of antineoplastic chemotherapy: Secondary | ICD-10-CM | POA: Diagnosis not present

## 2015-08-10 DIAGNOSIS — M19012 Primary osteoarthritis, left shoulder: Secondary | ICD-10-CM | POA: Diagnosis not present

## 2015-08-10 DIAGNOSIS — Z86718 Personal history of other venous thrombosis and embolism: Secondary | ICD-10-CM | POA: Diagnosis not present

## 2015-08-10 DIAGNOSIS — Z7901 Long term (current) use of anticoagulants: Secondary | ICD-10-CM | POA: Diagnosis not present

## 2015-08-10 DIAGNOSIS — M859 Disorder of bone density and structure, unspecified: Secondary | ICD-10-CM | POA: Diagnosis not present

## 2015-08-10 DIAGNOSIS — M25512 Pain in left shoulder: Secondary | ICD-10-CM | POA: Diagnosis not present

## 2015-08-13 DIAGNOSIS — Z6839 Body mass index (BMI) 39.0-39.9, adult: Secondary | ICD-10-CM | POA: Diagnosis not present

## 2015-08-13 DIAGNOSIS — Z1389 Encounter for screening for other disorder: Secondary | ICD-10-CM | POA: Diagnosis not present

## 2015-08-13 DIAGNOSIS — J069 Acute upper respiratory infection, unspecified: Secondary | ICD-10-CM | POA: Diagnosis not present

## 2015-08-13 DIAGNOSIS — J302 Other seasonal allergic rhinitis: Secondary | ICD-10-CM | POA: Diagnosis not present

## 2015-08-19 ENCOUNTER — Other Ambulatory Visit: Payer: Self-pay | Admitting: Pharmacist

## 2015-08-19 DIAGNOSIS — D6851 Activated protein C resistance: Secondary | ICD-10-CM

## 2015-08-19 DIAGNOSIS — I82723 Chronic embolism and thrombosis of deep veins of upper extremity, bilateral: Secondary | ICD-10-CM

## 2015-08-19 NOTE — Progress Notes (Signed)
Coordinating care for patient: will have INR done by [facility] at [address] [place], [place] Fax [phone]

## 2015-08-23 ENCOUNTER — Telehealth: Payer: Self-pay | Admitting: Pharmacist

## 2015-08-23 DIAGNOSIS — I82629 Acute embolism and thrombosis of deep veins of unspecified upper extremity: Secondary | ICD-10-CM | POA: Diagnosis not present

## 2015-08-23 DIAGNOSIS — D689 Coagulation defect, unspecified: Secondary | ICD-10-CM | POA: Diagnosis not present

## 2015-08-23 NOTE — Telephone Encounter (Addendum)
Anticoagulation Management Jessica Reilly is a 54 y.o. female who was contacted for monitoring of warfarin treatment.  Patient reports to [facility] in [place], [place] for INR testing.  Indication: DVT and factor V leiden mutation Duration: indefinite  Anticoagulation Clinic Visit History: ASSESSMENT Recent Results: Recent results are below, the most recent result is correlated with a dose of 47 mg per week: Lab Results  Component Value Date   INR 1.5 08/23/2015   INR 2.9 07/05/2015   INR 1.8 05/24/2015   INR 2.5 04/28/2015   PROTIME 27.6* 07/30/2013   INR today: Subtherapeutic  Anticoagulation Dosing: INR as of 07/05/2015 and Previous Dosing Information    INR Dt INR Goal Wkly Tot Sun Mon Tue Wed Thu Fri Sat   07/05/2015 2.9 2.0-3.0 47 mg 7 mg 7 mg 6 mg 7 mg 7 mg 7 mg 6 mg    Previous description        Continue taking 6mg  Tues/Sat and 7mg  other days Recheck INR around 07/05/15 at [facility].  We will call you when we have results.    Anticoagulation Dose Instructions as of 07/05/2015      Total Sun Mon Tue Wed Thu Fri Sat   New Dose 47 mg 7 mg 7 mg 6 mg 7 mg 7 mg 7 mg 6 mg     (2 mg x 1)  (2 mg x 1)  (2 mg x 0.5)  (2 mg x 1)  (2 mg x 1)  (2 mg x 1)  (2 mg x 0.5)      (5 mg x 1)  (5 mg x 1)  (5 mg x 1)  (5 mg x 1)  (5 mg x 1)  (5 mg x 1)  (5 mg x 1)                         Description        Continue taking 6mg  Tues/Sat and 7mg  other days Recheck INR at interval determined by Dr. [name] at visit 07/06/15      PLAN Did not change patient's dose today since patient has been maintained on current regimen > 1 year and has had some recent changes (patient stated she had been evaluated for possible shoulder surgery and also had sinusitis).  Follow-up 1 week  Nazanin Kinner J

## 2015-08-25 DIAGNOSIS — Z6838 Body mass index (BMI) 38.0-38.9, adult: Secondary | ICD-10-CM | POA: Diagnosis not present

## 2015-08-25 DIAGNOSIS — H6990 Unspecified Eustachian tube disorder, unspecified ear: Secondary | ICD-10-CM | POA: Diagnosis not present

## 2015-08-25 DIAGNOSIS — J019 Acute sinusitis, unspecified: Secondary | ICD-10-CM | POA: Diagnosis not present

## 2015-08-30 DIAGNOSIS — I82629 Acute embolism and thrombosis of deep veins of unspecified upper extremity: Secondary | ICD-10-CM | POA: Diagnosis not present

## 2015-08-30 DIAGNOSIS — D689 Coagulation defect, unspecified: Secondary | ICD-10-CM | POA: Diagnosis not present

## 2015-08-31 ENCOUNTER — Other Ambulatory Visit: Payer: Self-pay | Admitting: Pharmacist

## 2015-08-31 DIAGNOSIS — D6851 Activated protein C resistance: Secondary | ICD-10-CM

## 2015-08-31 DIAGNOSIS — D689 Coagulation defect, unspecified: Secondary | ICD-10-CM

## 2015-08-31 DIAGNOSIS — I82629 Acute embolism and thrombosis of deep veins of unspecified upper extremity: Secondary | ICD-10-CM

## 2015-08-31 DIAGNOSIS — D6859 Other primary thrombophilia: Secondary | ICD-10-CM

## 2015-08-31 NOTE — Telephone Encounter (Signed)
Anticoagulation Management Jessica Reilly is a 54 y.o. female who reports to the clinic for monitoring of warfarin treatment.    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  Anticoagulation Clinic Visit History ASSESSMENT Recent Results: Recent results are below, the most recent result is correlated with a dose of 47 mg per week: Lab Results  Component Value Date   INR 1.5 08/23/2015   INR 1.4 08/31/2015   INR 2.9 07/05/2015   INR 1.8 05/24/2015   INR 2.5 04/28/2015   PROTIME 27.6* 07/30/2013   INR today: Subtherapeutic  Anticoagulation Dosing: INR as of 07/05/2015 and Previous Dosing Information    INR Dt INR Goal Wkly Tot Sun Mon Tue Wed Thu Fri Sat   07/05/2015 2.9 2.0-3.0 52.5 mg 7.5 mg 7.5 mg 7.5 mg 7.5 mg 7.5 mg 7.5 mg 7.5 mg  Previous description        Continue taking 6mg  Tues/Sat and 7mg  other days Recheck INR around 07/05/15 at [facility].  We will call you when we have results.    Anticoagulation Dose Instructions as of 07/05/2015      Total Sun Mon Tue Wed Thu Fri Sat   New Dose 47 mg 7 mg 7 mg 6 mg 7 mg 7 mg 7 mg 6 mg     (2 mg x 1)  (2 mg x 1)  (2 mg x 0.5)  (2 mg x 1)  (2 mg x 1)  (2 mg x 1)  (2 mg x 0.5)      (5 mg x 1)  (5 mg x 1)  (5 mg x 1)  (5 mg x 1)  (5 mg x 1)  (5 mg x 1)  (5 mg x 1)                         Description        Continue taking 6mg  Tues/Sat and 7mg  other days Recheck INR at interval determined by Dr. [name] at visit 07/06/15      PLAN Weekly dose was increased by 12% to 52.5 mg per week  Patient was educated about dose change as well as change in tablet strength. Patient verbalized understanding by repeating back information.  Follow-up 1 week  Reynolds Kittel J

## 2015-09-01 MED ORDER — WARFARIN SODIUM 5 MG PO TABS: ORAL_TABLET | ORAL | Status: DC

## 2015-09-08 DIAGNOSIS — D689 Coagulation defect, unspecified: Secondary | ICD-10-CM | POA: Diagnosis not present

## 2015-09-08 DIAGNOSIS — I82629 Acute embolism and thrombosis of deep veins of unspecified upper extremity: Secondary | ICD-10-CM | POA: Diagnosis not present

## 2015-09-09 ENCOUNTER — Ambulatory Visit (INDEPENDENT_AMBULATORY_CARE_PROVIDER_SITE_OTHER): Payer: Medicare Other | Admitting: Pharmacist

## 2015-09-09 DIAGNOSIS — I82629 Acute embolism and thrombosis of deep veins of unspecified upper extremity: Secondary | ICD-10-CM

## 2015-09-09 LAB — POCT INR: INR: 2.1

## 2015-09-10 NOTE — Progress Notes (Signed)
Anticoagulation Management Sharia DELISHIA MCGAUGH is a 54 y.o. female who reports to the clinic for monitoring of warfarin treatment.    Indication: DVT Duration: indefinite  Anticoagulation Clinic Visit History: Anticoagulation Episode Summary    Current INR goal 2.0-3.0   Next INR check 09/15/2015   INR from last check 2.1 (09/09/2015)   Weekly max dose    Target end date 04/11/2015   INR check location    Preferred lab    Send INR reminders to [place]      Comments       Anticoagulation Care Providers    Provider Role Specialty Phone number   Annia Belt, MD Referring Oncology [phone]     ASSESSMENT Recent Results: Recent results are below, the most recent result is correlated with a dose of 52.5 mg per week: Lab Results  Component Value Date   INR 2.1 09/09/2015   INR 2.9 07/05/2015   INR 1.8 05/24/2015   PROTIME 27.6* 07/30/2013    INR today: Therapeutic  Anticoagulation Dosing: INR as of 09/09/2015 and Previous Dosing Information    INR Dt INR Goal Wkly Tot Sun Mon Tue Wed Thu Fri Sat   09/09/2015 2.1 2.0-3.0 52.5 mg 7.5 mg 7.5 mg 7.5 mg 7.5 mg 7.5 mg 7.5 mg 7.5 mg   Patient deviated from recommended dosing.       Previous description        Continue taking 6mg  Tues/Sat and 7mg  other days Recheck INR at interval determined by Dr. [name] at visit 07/06/15    Anticoagulation Dose Instructions as of 09/09/2015      Total Sun Mon Tue Wed Thu Fri Sat   New Dose 52.5 mg 7.5 mg 7.5 mg 7.5 mg 7.5 mg 7.5 mg 7.5 mg 7.5 mg     -  -  -  -  -  -  -      (5 mg x 1.5)  (5 mg x 1.5)  (5 mg x 1.5)  (5 mg x 1.5)  (5 mg x 1.5)  (5 mg x 1.5)  (5 mg x 1.5)                         Description        Dr. [name]      PLAN Weekly dose was unchanged  Patient Instructions  Patient educated about medication as defined in this encounter and verbalized understanding by repeating back instructions provided.    Follow-up 09/15/15  Kim,Jennifer J

## 2015-09-10 NOTE — Patient Instructions (Signed)
Patient educated about medication as defined in this encounter and verbalized understanding by repeating back instructions provided.   

## 2015-09-11 NOTE — Progress Notes (Signed)
Reviewed & agree Thank Karsten Fells

## 2015-09-14 LAB — PROTIME-INR

## 2015-09-15 DIAGNOSIS — D689 Coagulation defect, unspecified: Secondary | ICD-10-CM | POA: Diagnosis not present

## 2015-09-15 DIAGNOSIS — I82629 Acute embolism and thrombosis of deep veins of unspecified upper extremity: Secondary | ICD-10-CM | POA: Diagnosis not present

## 2015-09-17 ENCOUNTER — Telehealth: Payer: Self-pay | Admitting: Student-PharmD

## 2015-09-23 LAB — PROTIME-INR
INR: 1.4 — AB (ref 0.9–1.1)
INR: 1.5 — AB (ref 0.9–1.1)
INR: 2.1 — AB (ref 0.9–1.1)
INR: 2.3 — AB (ref 0.9–1.1)

## 2015-09-23 NOTE — Progress Notes (Signed)
I have reviewed Dr. Julianne Rice note.  Patient is on anticoagulation for a hypercoagulable state per chart review.   INR at goal.

## 2015-10-07 ENCOUNTER — Telehealth: Payer: Self-pay | Admitting: Pharmacist

## 2015-10-07 NOTE — Telephone Encounter (Signed)
Patient was called to advise her that her originally scheduled appointment would fall upon a day/date that the [facility] is to be closed for holiday. She was instructed to come to [facility] anticoagulation managment clinic on TUESDAY 3-JAN-17 at 1100h.

## 2015-10-13 DIAGNOSIS — I82629 Acute embolism and thrombosis of deep veins of unspecified upper extremity: Secondary | ICD-10-CM | POA: Diagnosis not present

## 2015-10-13 DIAGNOSIS — D689 Coagulation defect, unspecified: Secondary | ICD-10-CM | POA: Diagnosis not present

## 2015-10-14 ENCOUNTER — Ambulatory Visit (INDEPENDENT_AMBULATORY_CARE_PROVIDER_SITE_OTHER): Payer: Medicare Other | Admitting: Pharmacist

## 2015-10-14 DIAGNOSIS — Z7901 Long term (current) use of anticoagulants: Secondary | ICD-10-CM | POA: Diagnosis not present

## 2015-10-14 DIAGNOSIS — D6851 Activated protein C resistance: Secondary | ICD-10-CM

## 2015-10-14 DIAGNOSIS — I82621 Acute embolism and thrombosis of deep veins of right upper extremity: Secondary | ICD-10-CM | POA: Diagnosis not present

## 2015-10-14 LAB — POCT INR: INR: 2.8

## 2015-10-14 NOTE — Patient Instructions (Signed)
Patient educated about medication as defined in this encounter and verbalized understanding by repeating back instructions provided.   

## 2015-10-14 NOTE — Progress Notes (Signed)
Anticoagulation Management Jessica Reilly is a 55 y.o. female who reports to the clinic for monitoring of warfarin treatment.    Indication: DVT and factor V Leiden deficiency Duration: indefinite  Anticoagulation Clinic Visit History: Patient does not report signs/symptoms of bleeding or thromboembolism  Other recent changes: none  Anticoagulation Episode Summary    Current INR goal 2.0-3.0   Next INR check 11/08/2015   INR from last check 2.8 (10/14/2015)   Weekly max dose    Target end date 04/11/2015   INR check location    Preferred lab    Send INR reminders to [place]      Comments       Anticoagulation Care Providers    Provider Role Specialty Phone number   Annia Belt, MD Referring Oncology [phone]     ASSESSMENT Recent Results: Recent results are below, the most recent result is correlated with a dose of 52.5 mg per week: Lab Results  Component Value Date   INR 2.8 10/14/2015   INR 2.3* 09/15/2015   INR 2.1 09/09/2015   PROTIME 27.6* 07/30/2013   INR today: Therapeutic  Anticoagulation Dosing: INR as of 10/14/2015 and Previous Dosing Information    INR Dt INR Goal [name] Sun Mon Tue Wed Thu Fri Sat   10/14/2015 2.8 2.0-3.0 52.5 mg 7.5 mg 7.5 mg 7.5 mg 7.5 mg 7.5 mg 7.5 mg 7.5 mg    Previous description        Dr. [name]    Anticoagulation Dose Instructions as of 10/14/2015      Total Sun Mon Tue Wed Thu Fri Sat   New Dose 52.5 mg 7.5 mg 7.5 mg 7.5 mg 7.5 mg 7.5 mg 7.5 mg 7.5 mg     -  -  -  -  -  -  -      (5 mg x 1.5)  (5 mg x 1.5)  (5 mg x 1.5)  (5 mg x 1.5)  (5 mg x 1.5)  (5 mg x 1.5)  (5 mg x 1.5)                         Description        Dr. [name]      PLAN Weekly dose was unchanged   Patient Instructions  Patient educated about medication as defined in this encounter and verbalized understanding by repeating back instructions provided.   Follow-up Return in about 4 weeks (around  11/11/2015).  Jashad Depaula J

## 2015-10-15 NOTE — Progress Notes (Signed)
Reviewed Thanks DrG 

## 2015-11-15 DIAGNOSIS — M8580 Other specified disorders of bone density and structure, unspecified site: Secondary | ICD-10-CM | POA: Diagnosis not present

## 2015-11-15 DIAGNOSIS — Z853 Personal history of malignant neoplasm of breast: Secondary | ICD-10-CM | POA: Diagnosis not present

## 2015-11-15 DIAGNOSIS — Z923 Personal history of irradiation: Secondary | ICD-10-CM | POA: Diagnosis not present

## 2015-11-15 DIAGNOSIS — D6851 Activated protein C resistance: Secondary | ICD-10-CM | POA: Diagnosis not present

## 2015-11-15 DIAGNOSIS — M85862 Other specified disorders of bone density and structure, left lower leg: Secondary | ICD-10-CM | POA: Diagnosis not present

## 2015-11-15 DIAGNOSIS — Z9013 Acquired absence of bilateral breasts and nipples: Secondary | ICD-10-CM | POA: Diagnosis not present

## 2015-11-15 DIAGNOSIS — M7501 Adhesive capsulitis of right shoulder: Secondary | ICD-10-CM | POA: Diagnosis not present

## 2015-11-15 DIAGNOSIS — M170 Bilateral primary osteoarthritis of knee: Secondary | ICD-10-CM | POA: Diagnosis not present

## 2015-11-15 DIAGNOSIS — M85861 Other specified disorders of bone density and structure, right lower leg: Secondary | ICD-10-CM | POA: Diagnosis not present

## 2015-11-15 DIAGNOSIS — I10 Essential (primary) hypertension: Secondary | ICD-10-CM | POA: Diagnosis not present

## 2015-11-15 DIAGNOSIS — M7502 Adhesive capsulitis of left shoulder: Secondary | ICD-10-CM | POA: Diagnosis not present

## 2015-11-15 DIAGNOSIS — Z86718 Personal history of other venous thrombosis and embolism: Secondary | ICD-10-CM | POA: Diagnosis not present

## 2015-11-15 DIAGNOSIS — K219 Gastro-esophageal reflux disease without esophagitis: Secondary | ICD-10-CM | POA: Diagnosis not present

## 2015-11-15 DIAGNOSIS — D689 Coagulation defect, unspecified: Secondary | ICD-10-CM | POA: Diagnosis not present

## 2015-11-15 DIAGNOSIS — I82629 Acute embolism and thrombosis of deep veins of unspecified upper extremity: Secondary | ICD-10-CM | POA: Diagnosis not present

## 2015-11-15 DIAGNOSIS — Z8739 Personal history of other diseases of the musculoskeletal system and connective tissue: Secondary | ICD-10-CM | POA: Diagnosis not present

## 2015-11-15 DIAGNOSIS — R413 Other amnesia: Secondary | ICD-10-CM | POA: Diagnosis not present

## 2015-11-15 DIAGNOSIS — Z9221 Personal history of antineoplastic chemotherapy: Secondary | ICD-10-CM | POA: Diagnosis not present

## 2015-11-16 ENCOUNTER — Ambulatory Visit (INDEPENDENT_AMBULATORY_CARE_PROVIDER_SITE_OTHER): Payer: Medicare Other | Admitting: Pharmacist

## 2015-11-16 DIAGNOSIS — Z7901 Long term (current) use of anticoagulants: Secondary | ICD-10-CM | POA: Diagnosis not present

## 2015-11-16 DIAGNOSIS — I82721 Chronic embolism and thrombosis of deep veins of right upper extremity: Secondary | ICD-10-CM | POA: Diagnosis not present

## 2015-11-16 LAB — POCT INR: INR: 2.9

## 2015-11-16 NOTE — Progress Notes (Signed)
Reviewed Thanks DrG 

## 2015-11-16 NOTE — Patient Instructions (Signed)
Patient educated about medication as defined in this encounter and verbalized understanding by repeating back instructions provided.   

## 2015-11-16 NOTE — Progress Notes (Signed)
Anticoagulation Management Jessica Reilly is a 55 y.o. female who reports to the clinic for monitoring of warfarin treatment.    Indication: DVT Duration: indefinite  Anticoagulation Clinic Visit History: Patient does not report signs/symptoms of bleeding or thromboembolism   Anticoagulation Episode Summary    Current INR goal 2.0-3.0   Next INR check 11/30/2015   INR from last check 2.9 (11/16/2015)   Weekly max dose    Target end date 04/11/2015   INR check location    Preferred lab    Send INR reminders to [place]      Comments       Anticoagulation Care Providers    Provider Role Specialty Phone number   Annia Belt, MD Referring Oncology [phone]     ASSESSMENT Recent Results: The most recent result is correlated with 52.5 mg per week: Lab Results  Component Value Date   INR 2.9 11/16/2015   INR 2.8 10/14/2015   INR 2.3* 09/15/2015   PROTIME 27.6* 07/30/2013   Anticoagulation Dosing: INR as of 11/16/2015 and Previous Dosing Information    INR Dt INR Goal [name] Sun Mon Tue Wed Thu Fri Sat   11/16/2015 2.9 2.0-3.0 52.5 mg 7.5 mg 7.5 mg 7.5 mg 7.5 mg 7.5 mg 7.5 mg 7.5 mg    Previous description        Dr. [name]    Anticoagulation Dose Instructions as of 11/16/2015      Total Sun Mon Tue Wed Thu Fri Sat   New Dose 52.5 mg 7.5 mg 7.5 mg 7.5 mg 7.5 mg 7.5 mg 7.5 mg 7.5 mg     (5 mg x 1.5)  (5 mg x 1.5)  (5 mg x 1.5)  (5 mg x 1.5)  (5 mg x 1.5)  (5 mg x 1.5)  (5 mg x 1.5)                         Description        Dr. [name]      INR today: Therapeutic  PLAN Weekly dose was unchanged. Will work with patient to obtain a home [employer] meter, she states she has a barrier with transportation to clinic and has a history of stable INRs.  Patient Instructions  Patient educated about medication as defined in this encounter and verbalized understanding by repeating back instructions provided.    Patient advised to contact  clinic or seek medical attention if signs/symptoms of bleeding or thromboembolism occur.  Follow-up No Follow-up on file.  Kim,Jennifer J

## 2015-12-02 LAB — PROTIME-INR: INR: 2.9

## 2015-12-10 LAB — PROTIME-INR: INR: 2.8

## 2015-12-23 DIAGNOSIS — D6851 Activated protein C resistance: Secondary | ICD-10-CM | POA: Diagnosis not present

## 2016-01-01 ENCOUNTER — Other Ambulatory Visit: Payer: Self-pay | Admitting: Oncology

## 2016-01-03 ENCOUNTER — Encounter: Payer: Self-pay | Admitting: Pharmacist

## 2016-01-03 NOTE — Progress Notes (Signed)
Reviewed Thanks DrG 

## 2016-01-03 NOTE — Progress Notes (Signed)
Patient ID: Jessica Reilly, female   DOB: Oct 21, 1960, 55 y.o.   MRN: [id number]  Anticoagulation Management Jessica Reilly is a 55 y.o. female who reports to the clinic for monitoring of warfarin treatment.    Indication: DVT and factor V Leiden deficiency Duration: indefinite  Anticoagulation Clinic Visit History: Patient does not report signs/symptoms of bleeding or thromboembolism. Of note, patient has a home INR meter provided by [employer].   Anticoagulation Episode Summary    Current INR goal 2.0-3.0   Next INR check 11/30/2015   INR from last check 2.9 (11/16/2015)   Weekly max dose    Target end date 04/11/2015   INR check location    Preferred lab    Send INR reminders to [place]      Comments       Anticoagulation Care Providers    Provider Role Specialty Phone number   Annia Belt, MD Referring Oncology [phone]     ASSESSMENT Recent Results: The most recent result is correlated with 52.5 mg per week: Lab Results  Component Value Date   INR 2.1 01/03/2016   INR 2.9 11/16/2015   INR 2.8 10/14/2015   INR 2.3* 09/15/2015   PROTIME 27.6* 07/30/2013   Anticoagulation Dosing: INR as of 11/16/2015 and Previous Dosing Information    INR Dt INR Goal [name] Sun Mon Tue Wed Thu Fri Sat   11/16/2015 2.9 2.0-3.0 52.5 mg 7.5 mg 7.5 mg 7.5 mg 7.5 mg 7.5 mg 7.5 mg 7.5 mg    Previous description        Dr. [name]    Anticoagulation Dose Instructions as of 11/16/2015      Total Sun Mon Tue Wed Thu Fri Sat   New Dose 52.5 mg 7.5 mg 7.5 mg 7.5 mg 7.5 mg 7.5 mg 7.5 mg 7.5 mg     (5 mg x 1.5)  (5 mg x 1.5)  (5 mg x 1.5)  (5 mg x 1.5)  (5 mg x 1.5)  (5 mg x 1.5)  (5 mg x 1.5)                         Description        Dr. [name]      INR today: Therapeutic  PLAN Weekly dose was unchanged  Patient advised to contact clinic or seek medical attention if signs/symptoms of bleeding or thromboembolism occur.  Patient verbalized  understanding by repeating back information and was advised to contact me if further medication-related questions arise. Patient was also provided an information handout.  Follow-up No Follow-up on file.  Kim,Jennifer J

## 2016-01-10 ENCOUNTER — Ambulatory Visit (INDEPENDENT_AMBULATORY_CARE_PROVIDER_SITE_OTHER): Payer: Medicare Other | Admitting: Adult Health

## 2016-01-10 ENCOUNTER — Encounter: Payer: Self-pay | Admitting: Adult Health

## 2016-01-10 VITALS — BP 132/90 | HR 76 | Ht 61.5 in | Wt 215.0 lb

## 2016-01-10 DIAGNOSIS — Z01419 Encounter for gynecological examination (general) (routine) without abnormal findings: Secondary | ICD-10-CM

## 2016-01-10 DIAGNOSIS — Z1212 Encounter for screening for malignant neoplasm of rectum: Secondary | ICD-10-CM | POA: Diagnosis not present

## 2016-01-10 DIAGNOSIS — Z853 Personal history of malignant neoplasm of breast: Secondary | ICD-10-CM

## 2016-01-10 DIAGNOSIS — Z8742 Personal history of other diseases of the female genital tract: Secondary | ICD-10-CM

## 2016-01-10 LAB — HEMOCCULT GUIAC POC 1CARD (OFFICE): FECAL OCCULT BLD: NEGATIVE

## 2016-01-10 NOTE — Progress Notes (Signed)
Patient ID: Jessica Reilly, female   DOB: January 23, 1961, 55 y.o.   MRN: [id number] History of Present Illness: Jessica Reilly is a 55 year old white female, in for well woman gyn exam, she had a normal pap with negative HPV 12/10/14.She has had breast cancer.She has had abnormal pap in past and has had ovaries removed. PCP is Dr Hilma Favors.  Current Medications, Allergies, Past Medical History, Past Surgical History, Family History and Social History were reviewed in [employer] record.     Review of Systems: Patient denies any daily headaches, hearing loss, fatigue, blurred vision, shortness of breath, chest pain, abdominal pain, problems with bowel movements, urination, or intercourse(not having sex). No mood swings.Has pain in knees and right shoulder, sees MD.    Physical Exam:BP 150/98 mmHg  Pulse 76  Ht 5' 1.5" (1.562 m)  Wt 215 lb (97.523 kg)  BMI 39.97 kg/m2  LMP  BP recheck 132/90 General:  Well developed, well nourished, no acute distress Skin:  Warm and dry Neck:  Midline trachea, normal thyroid, good ROM, no lymphadenopathy Lungs; Clear to auscultation bilaterally Breast: Absent, no chest wall tenderness or masses noted Cardiovascular: Regular rate and rhythm Abdomen:  Soft, non tender, no hepatosplenomegaly Pelvic:  External genitalia is normal in appearance, no lesions.  The vagina is normal in appearance. Urethra has no lesions or masses. The cervix is smooth.  Uterus is felt to be normal size, shape, and contour.  No adnexal masses or tenderness noted.Bladder is non tender, no masses felt. Rectal: Good sphincter tone, no polyps, or hemorrhoids felt.  Hemoccult negative. Extremities/musculoskeletal:  No swelling or varicosities noted, no clubbing or cyanosis Psych:  No mood changes, alert and cooperative,seems happy   Impression: Well woman gyn exam no pap History of breast cancer History of abnormal pap   Plan: Labs with PCP  Colonoscopy per GI Physical  in 1 year, and pap in 1-2 years

## 2016-01-10 NOTE — Patient Instructions (Signed)
Physical in 1 year Labs per PCP

## 2016-01-17 DIAGNOSIS — R413 Other amnesia: Secondary | ICD-10-CM | POA: Diagnosis not present

## 2016-01-17 DIAGNOSIS — Z08 Encounter for follow-up examination after completed treatment for malignant neoplasm: Secondary | ICD-10-CM | POA: Diagnosis not present

## 2016-01-17 DIAGNOSIS — C50111 Malignant neoplasm of central portion of right female breast: Secondary | ICD-10-CM | POA: Diagnosis not present

## 2016-01-17 DIAGNOSIS — Z853 Personal history of malignant neoplasm of breast: Secondary | ICD-10-CM | POA: Diagnosis not present

## 2016-01-17 DIAGNOSIS — Z5181 Encounter for therapeutic drug level monitoring: Secondary | ICD-10-CM | POA: Diagnosis not present

## 2016-01-17 DIAGNOSIS — Z9221 Personal history of antineoplastic chemotherapy: Secondary | ICD-10-CM | POA: Diagnosis not present

## 2016-01-17 DIAGNOSIS — M8580 Other specified disorders of bone density and structure, unspecified site: Secondary | ICD-10-CM | POA: Diagnosis not present

## 2016-01-17 DIAGNOSIS — K219 Gastro-esophageal reflux disease without esophagitis: Secondary | ICD-10-CM | POA: Diagnosis not present

## 2016-01-17 DIAGNOSIS — Z7901 Long term (current) use of anticoagulants: Secondary | ICD-10-CM | POA: Diagnosis not present

## 2016-01-17 DIAGNOSIS — Z923 Personal history of irradiation: Secondary | ICD-10-CM | POA: Diagnosis not present

## 2016-01-27 DIAGNOSIS — D689 Coagulation defect, unspecified: Secondary | ICD-10-CM | POA: Diagnosis not present

## 2016-01-27 DIAGNOSIS — I82629 Acute embolism and thrombosis of deep veins of unspecified upper extremity: Secondary | ICD-10-CM | POA: Diagnosis not present

## 2016-02-03 LAB — PROTIME-INR: INR: 3.3

## 2016-02-08 ENCOUNTER — Telehealth: Payer: Self-pay | Admitting: Pharmacist

## 2016-02-09 NOTE — Telephone Encounter (Signed)
Patient called and left message on question regarding warfarin.  Unable to reach patient, called x 2 and left messages.

## 2016-02-10 ENCOUNTER — Telehealth (INDEPENDENT_AMBULATORY_CARE_PROVIDER_SITE_OTHER): Payer: Medicare Other | Admitting: Pharmacist

## 2016-02-10 DIAGNOSIS — I82623 Acute embolism and thrombosis of deep veins of upper extremity, bilateral: Secondary | ICD-10-CM | POA: Diagnosis not present

## 2016-02-10 LAB — POCT INR: INR: 3.1

## 2016-02-10 MED ORDER — WARFARIN SODIUM 5 MG PO TABS: 7.5000 mg | ORAL_TABLET | Freq: Every day | ORAL | Status: DC

## 2016-02-10 NOTE — Telephone Encounter (Signed)
Anticoagulation Management Jessica Reilly is a 55 y.o. female who reports to the clinic for monitoring of warfarin treatment.    Indication: DVTand coagulopathy Duration: indefinite  Anticoagulation Clinic Visit History: Patient does not report signs/symptoms of bleeding or thromboembolism  ASSESSMENT Recent Results: The most recent result is correlated with 47.5 mg per week: Lab Results  Component Value Date   INR 3.1 02/10/2016   INR 3.3 01/27/2016   INR 2.9 11/16/2015   INR 2.9 11/15/2015   PROTIME 27.6* 07/30/2013   Anticoagulation Dosing:  New Dose 47.5 mg 5 mg 7.5 mg 7.5 mg 7.5 mg 5 mg 7.5 mg 7.5 mg             Description        Dr. [name]      INR today: Therapeutic, slightly elevated, but will follow up within 2 weeks  PLAN Weekly dose was unchanged   There are no Patient Instructions on file for this visit. Patient advised to contact clinic or seek medical attention if signs/symptoms of bleeding or thromboembolism occur.  Patient verbalized understanding by repeating back information and was advised to contact me if further medication-related questions arise. Patient was also provided an information handout.  Follow-up 2 weeks  [name]

## 2016-02-23 DIAGNOSIS — E782 Mixed hyperlipidemia: Secondary | ICD-10-CM | POA: Diagnosis not present

## 2016-02-23 DIAGNOSIS — R7309 Other abnormal glucose: Secondary | ICD-10-CM | POA: Diagnosis not present

## 2016-02-23 DIAGNOSIS — E538 Deficiency of other specified B group vitamins: Secondary | ICD-10-CM | POA: Diagnosis not present

## 2016-02-23 DIAGNOSIS — Z1389 Encounter for screening for other disorder: Secondary | ICD-10-CM | POA: Diagnosis not present

## 2016-02-23 DIAGNOSIS — H6092 Unspecified otitis externa, left ear: Secondary | ICD-10-CM | POA: Diagnosis not present

## 2016-02-23 DIAGNOSIS — H6123 Impacted cerumen, bilateral: Secondary | ICD-10-CM | POA: Diagnosis not present

## 2016-02-23 DIAGNOSIS — M1991 Primary osteoarthritis, unspecified site: Secondary | ICD-10-CM | POA: Diagnosis not present

## 2016-03-09 ENCOUNTER — Encounter: Payer: Self-pay | Admitting: Pharmacist

## 2016-03-09 DIAGNOSIS — D6851 Activated protein C resistance: Secondary | ICD-10-CM | POA: Diagnosis not present

## 2016-03-09 LAB — POCT INR: INR: 1.1

## 2016-03-09 NOTE — Progress Notes (Signed)
Reviewed Thanks DrG 

## 2016-03-09 NOTE — Progress Notes (Signed)
Patient ID: Jessica Reilly, female   DOB: Jan 31, 1961, 55 y.o.   MRN: [id number] Anticoagulation Management Jessica Reilly is a 55 y.o. female who reports to the clinic for monitoring of warfarin treatment.    Indication: DVT and factor V Leiden deficiency Duration: indefinite  Anticoagulation Clinic Visit History: Patient reports mild chest pain and shortness of breath which resolves after alprazolam. Advised patient to seek medical attention if symptoms return/persist/worsen. Patient verbalized understanding.   Anticoagulation Episode Summary    Current INR goal 2.0-3.0   Next INR check 11/30/2015   INR from last check 2.9 (11/16/2015)   Most recent INR 1.1! (03/09/2016)   Weekly max dose    Target end date 04/11/2015   INR check location    Preferred lab    Send INR reminders to [place]      Comments       Anticoagulation Care Providers    Provider Role Specialty Phone number   Annia Belt, MD Referring Oncology [phone]     ASSESSMENT Recent Results: The most recent result is correlated with 47.5 mg per week: Lab Results  Component Value Date   INR 1.1 03/09/2016   INR 3.1 02/10/2016   INR 3.3 01/27/2016   INR 2.9 11/16/2015   PROTIME 27.6* 07/30/2013   Anticoagulation Dosing: INR as of 11/16/2015 and Previous Dosing Information    INR Dt INR Goal [name] Sun Mon Tue Wed Thu Fri Sat   11/16/2015 2.9 2.0-3.0 47.5 mg 5 mg 7.5 mg 5 mg 7.5 mg 7.5 mg 7.5 mg 7.5 mg    Previous description        Dr. [name]    Anticoagulation Dose Instructions as of 11/16/2015      Total Sun Mon Tue Wed Thu Fri Sat   New Dose 52.5 mg 7.5 mg 7.5 mg 7.5 mg 7.5 mg 7.5 mg 7.5 mg 7.5 mg     (5 mg x 1.5)  (5 mg x 1.5)  (5 mg x 1.5)  (5 mg x 1.5)  (5 mg x 1.5)  (5 mg x 1.5)  (5 mg x 1.5)                         Description        Dr. [name]      INR today: Subtherapeutic. Patient was taking prednisone which was d/c yesterday. She also states she started  B12 which is not found to interact with warfarin.  PLAN Weekly dose was increased by 10% to 52.5 mg per week  Patient advised to contact clinic or seek medical attention if signs/symptoms of bleeding or thromboembolism occur.  Patient verbalized understanding by repeating back information and was advised to contact me if further medication-related questions arise. Patient was also provided an information handout.  Follow-up 03/16/16  Kim,Jennifer J

## 2016-03-09 NOTE — Addendum Note (Signed)
Addended by: Forde Dandy on: 03/09/2016 02:38 PM   Modules accepted: Orders

## 2016-03-10 DIAGNOSIS — R419 Unspecified symptoms and signs involving cognitive functions and awareness: Secondary | ICD-10-CM | POA: Diagnosis not present

## 2016-03-10 DIAGNOSIS — R413 Other amnesia: Secondary | ICD-10-CM | POA: Diagnosis not present

## 2016-03-10 DIAGNOSIS — F3130 Bipolar disorder, current episode depressed, mild or moderate severity, unspecified: Secondary | ICD-10-CM | POA: Diagnosis not present

## 2016-03-13 DIAGNOSIS — I10 Essential (primary) hypertension: Secondary | ICD-10-CM | POA: Diagnosis not present

## 2016-03-13 DIAGNOSIS — J45909 Unspecified asthma, uncomplicated: Secondary | ICD-10-CM | POA: Diagnosis not present

## 2016-03-13 DIAGNOSIS — R413 Other amnesia: Secondary | ICD-10-CM | POA: Diagnosis not present

## 2016-03-17 ENCOUNTER — Telehealth: Payer: Self-pay | Admitting: Pharmacist

## 2016-03-17 DIAGNOSIS — I82623 Acute embolism and thrombosis of deep veins of upper extremity, bilateral: Secondary | ICD-10-CM

## 2016-03-17 MED ORDER — WARFARIN SODIUM 5 MG PO TABS: 7.5000 mg | ORAL_TABLET | Freq: Every day | ORAL | Status: DC

## 2016-03-17 NOTE — Telephone Encounter (Signed)
Patient requested warfarin refill

## 2016-03-28 LAB — PROTIME-INR

## 2016-04-13 DIAGNOSIS — E538 Deficiency of other specified B group vitamins: Secondary | ICD-10-CM | POA: Diagnosis not present

## 2016-04-17 DIAGNOSIS — Z880 Allergy status to penicillin: Secondary | ICD-10-CM | POA: Diagnosis not present

## 2016-04-17 DIAGNOSIS — C50919 Malignant neoplasm of unspecified site of unspecified female breast: Secondary | ICD-10-CM | POA: Diagnosis not present

## 2016-04-17 DIAGNOSIS — C50111 Malignant neoplasm of central portion of right female breast: Secondary | ICD-10-CM | POA: Diagnosis not present

## 2016-04-17 DIAGNOSIS — M810 Age-related osteoporosis without current pathological fracture: Secondary | ICD-10-CM | POA: Diagnosis not present

## 2016-04-17 DIAGNOSIS — Z9889 Other specified postprocedural states: Secondary | ICD-10-CM | POA: Diagnosis not present

## 2016-04-17 DIAGNOSIS — Z79811 Long term (current) use of aromatase inhibitors: Secondary | ICD-10-CM | POA: Diagnosis not present

## 2016-04-17 DIAGNOSIS — J45909 Unspecified asthma, uncomplicated: Secondary | ICD-10-CM | POA: Diagnosis not present

## 2016-04-17 DIAGNOSIS — Z923 Personal history of irradiation: Secondary | ICD-10-CM | POA: Diagnosis not present

## 2016-04-17 DIAGNOSIS — R413 Other amnesia: Secondary | ICD-10-CM | POA: Diagnosis not present

## 2016-04-17 DIAGNOSIS — I10 Essential (primary) hypertension: Secondary | ICD-10-CM | POA: Diagnosis not present

## 2016-04-17 DIAGNOSIS — Z79899 Other long term (current) drug therapy: Secondary | ICD-10-CM | POA: Diagnosis not present

## 2016-04-17 DIAGNOSIS — Z7901 Long term (current) use of anticoagulants: Secondary | ICD-10-CM | POA: Diagnosis not present

## 2016-04-17 DIAGNOSIS — Z9013 Acquired absence of bilateral breasts and nipples: Secondary | ICD-10-CM | POA: Diagnosis not present

## 2016-04-17 DIAGNOSIS — R4189 Other symptoms and signs involving cognitive functions and awareness: Secondary | ICD-10-CM | POA: Diagnosis not present

## 2016-04-17 DIAGNOSIS — Z888 Allergy status to other drugs, medicaments and biological substances status: Secondary | ICD-10-CM | POA: Diagnosis not present

## 2016-04-17 DIAGNOSIS — Z9221 Personal history of antineoplastic chemotherapy: Secondary | ICD-10-CM | POA: Diagnosis not present

## 2016-04-17 DIAGNOSIS — Z5181 Encounter for therapeutic drug level monitoring: Secondary | ICD-10-CM | POA: Diagnosis not present

## 2016-04-18 ENCOUNTER — Telehealth (INDEPENDENT_AMBULATORY_CARE_PROVIDER_SITE_OTHER): Payer: Medicare Other | Admitting: Pharmacist

## 2016-04-18 DIAGNOSIS — I82629 Acute embolism and thrombosis of deep veins of unspecified upper extremity: Secondary | ICD-10-CM

## 2016-04-18 DIAGNOSIS — Z7901 Long term (current) use of anticoagulants: Secondary | ICD-10-CM

## 2016-04-18 DIAGNOSIS — J069 Acute upper respiratory infection, unspecified: Secondary | ICD-10-CM | POA: Diagnosis not present

## 2016-04-18 DIAGNOSIS — Z1389 Encounter for screening for other disorder: Secondary | ICD-10-CM | POA: Diagnosis not present

## 2016-04-18 DIAGNOSIS — A493 Mycoplasma infection, unspecified site: Secondary | ICD-10-CM | POA: Diagnosis not present

## 2016-04-18 LAB — POCT INR: INR: 2.6

## 2016-04-18 NOTE — Telephone Encounter (Signed)
Anticoagulation Management Jessica Reilly is a 55 y.o. female who reports to the clinic for monitoring of warfarin treatment.    Indication: DVT and factor V Leiden mutation Duration: indefinite  Anticoagulation Clinic Visit History: Patient does not report signs/symptoms of bleeding or thromboembolism. Patient states she is sick with pneumonia and will be starting azithromycin and steroids today.  ASSESSMENT Recent Results: The most recent result is correlated with 52.5 mg per week: Lab Results  Component Value Date   INR 2.6 04/18/2016   INR 1.1 03/09/2016   INR 3.1 02/10/2016   PROTIME 27.6* 07/30/2013   Anticoagulation Dosing: INR as of 11/16/2015 and Previous Dosing Information    INR Dt INR Goal [name] Sun Mon Tue Wed Thu Fri Sat   11/16/2015 2.9 2.0-3.0 52.5 mg 7.5 mg 7.5 mg 7.5 mg 7.5 mg 7.5 mg 7.5 mg 7.5 mg    Previous description        Dr. [name]    Anticoagulation Dose Instructions as of 11/16/2015      Total Sun Mon Tue Wed Thu Fri Sat   New Dose 52.5 mg 7.5 mg 7.5 mg 7.5 mg 7.5 mg 7.5 mg 7.5 mg 7.5 mg     (5 mg x 1.5)  (5 mg x 1.5)  (5 mg x 1.5)  (5 mg x 1.5)  (5 mg x 1.5)  (5 mg x 1.5)  (5 mg x 1.5)                         Description        Dr. [name]      INR today: Therapeutic  PLAN Weekly dose was unchanged but will need to monitor closely due to treatment for pneumonia as stated above  Patient advised to contact clinic or seek medical attention if signs/symptoms of bleeding or thromboembolism occur.  Patient verbalized understanding by repeating back information and was advised to contact me if further medication-related questions arise. Patient was also provided an information handout.  Follow-up 3 days (Friday 04/21/16)  Kim,Jennifer J

## 2016-04-18 NOTE — Telephone Encounter (Signed)
Reviewed Thanks DrG I am away this week & may not have internet acess  I do have 24/7 access to [facility] email if any pressing issues

## 2016-04-19 ENCOUNTER — Encounter (HOSPITAL_COMMUNITY): Payer: Self-pay

## 2016-04-19 ENCOUNTER — Emergency Department (HOSPITAL_COMMUNITY)
Admission: EM | Admit: 2016-04-19 | Discharge: 2016-04-20 | Disposition: A | Discharge status: Home / Self Care | Payer: Medicare Other | Attending: Emergency Medicine | Admitting: Emergency Medicine

## 2016-04-19 ENCOUNTER — Emergency Department (HOSPITAL_COMMUNITY): Payer: Medicare Other

## 2016-04-19 DIAGNOSIS — Z79899 Other long term (current) drug therapy: Secondary | ICD-10-CM | POA: Diagnosis not present

## 2016-04-19 DIAGNOSIS — I10 Essential (primary) hypertension: Secondary | ICD-10-CM | POA: Diagnosis not present

## 2016-04-19 DIAGNOSIS — M1990 Unspecified osteoarthritis, unspecified site: Secondary | ICD-10-CM | POA: Insufficient documentation

## 2016-04-19 DIAGNOSIS — J189 Pneumonia, unspecified organism: Secondary | ICD-10-CM

## 2016-04-19 DIAGNOSIS — F329 Major depressive disorder, single episode, unspecified: Secondary | ICD-10-CM | POA: Insufficient documentation

## 2016-04-19 DIAGNOSIS — R509 Fever, unspecified: Secondary | ICD-10-CM | POA: Diagnosis present

## 2016-04-19 DIAGNOSIS — R0602 Shortness of breath: Secondary | ICD-10-CM | POA: Diagnosis not present

## 2016-04-19 DIAGNOSIS — Z853 Personal history of malignant neoplasm of breast: Secondary | ICD-10-CM | POA: Diagnosis not present

## 2016-04-19 DIAGNOSIS — R05 Cough: Secondary | ICD-10-CM | POA: Diagnosis not present

## 2016-04-19 MED ORDER — ONDANSETRON HCL 4 MG/2ML IJ SOLN
4.0000 mg | Freq: Once | INTRAMUSCULAR | Status: AC
  Administered 2016-04-20: 4 mg via INTRAVENOUS
  Filled 2016-04-19: qty 2

## 2016-04-19 MED ORDER — SODIUM CHLORIDE 0.9 % IV BOLUS (SEPSIS)
1000.0000 mL | Freq: Once | INTRAVENOUS | Status: AC
  Administered 2016-04-20: 1000 mL via INTRAVENOUS

## 2016-04-19 NOTE — ED Notes (Signed)
Pt placed on 1L [place] for comfort, O2 saturation on RA is 99%.

## 2016-04-19 NOTE — ED Provider Notes (Signed)
CSN: [id number]     Arrival date & time 04/19/16  1953 History  By signing my name below, I, Jessica Reilly, attest that this documentation has been prepared under the direction and in the presence of Merryl Hacker, MD. Electronically Signed: Judithann Sauger, ED Scribe. 04/19/2016. 11:21 PM.    Chief Complaint  Patient presents with  . Fever   The history is provided by the patient. No language interpreter was used.   HPI Comments: Jessica Reilly is a 55 y.o. female with a hx of hypertension who presents to the Emergency Department complaining of ongoing fever (highest temp 102 PTA) and chills onset 3 days ago. She reports associated persistent non-productive cough, chest tightness, SOB, right abdominal pain, and nausea. No alleviating factors noted. Pt states that she has tried Tylenol with provides temporary relief of her fever. Pt went to see her PCP 2 days ago and was diagnosed with pneumonia and given a Z-pak. Pt reports a sick contact of a 55 year old with walking pneumonia. She states that she is here today because her symptoms are progressing even with the Z-pak that she started 2 days ago. She denies that she is current smoker. Pt denies any rash, diarrhea, or chest pain.    Past Medical History  Diagnosis Date  . HTN (hypertension)   . Acid reflux   . High cholesterol   . HX: breast cancer   . Gallstones   . Kidney stones   . Fibroid   . Heart murmur   . Osteoarthritis   . Factor V Leiden ([place])   . Coagulopathy ([place]) 04/10/2012  . Factor V Leiden mutation ([place]) 04/10/2012  . DVT of upper extremity (deep vein thrombosis) ([place]) 04/10/2012  . Lobular carcinoma of breast, estrogen receptor positive, 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  . Abnormal Pap smear   . Mental disorder     panic attacks  . Constipation   . Pain in both knees   . Hypertension 12/31/2012  . Chronic anticoagulation 03/22/2013  . Bipolar disorder ([place]) 03/22/2013  . Vaginal Pap smear, abnormal   . Breast disorder      cancer  . History of breast cancer 01/06/2015  . History of abnormal cervical Pap smear 01/06/2015  . Breast cancer ([place])   . Family history of ovarian cancer    Past Surgical History  Procedure Laterality Date  . Mastectomy  bilateral  . Breast lumpectomy    . Ovaries removed    . Tonsillectomy and adenoidectomy    . Lymph node biopsy    . Colonoscopy with esophagogastroduodenoscopy (egd) N/A 07/23/2013    Procedure: COLONOSCOPY WITH ESOPHAGOGASTRODUODENOSCOPY (EGD);  Surgeon: Rogene Houston, MD;  Location: [facility];  Service: Endoscopy;  Laterality: N/A;  125   Family History  Problem Relation Age of Onset  . Alcohol abuse Mother   . COPD Mother   . Alcohol abuse Father   . Heart disease Father   . Pneumonia Father   . Diabetes Father   . Colon cancer Father     Dx 47s; deceased 56s  . OCD Daughter   . Bipolar disorder Daughter   . Hypertension Daughter   . Schizophrenia Daughter   . Schizophrenia Daughter   . Bipolar disorder Daughter   . OCD Daughter   . Hypertension Daughter   . COPD Daughter   . Other Daughter     stomach don't empty out  . ADD / ADHD Grandchild   . Anxiety disorder Grandchild   .  Depression Grandchild   . Healthy Sister   . Cancer Brother     leukemia 58s; deceased 57  . Heart disease Brother   . Cancer Brother     liver; currently 49  . Cirrhosis Brother   . Other Brother     waiting for liver transplant  . Alzheimer's disease Paternal Grandmother   . Ovarian cancer Maternal Grandmother 52    deceased 102  . Cancer Maternal Uncle     3 of 6 with various cancers: skin, NHL, Hodkin's Dx  . Breast cancer Cousin 67    mat first cousin related through unaffected aunt   Social History  Substance Use Topics  . Smoking status: Never Smoker   . Smokeless tobacco: Never Used  . Alcohol Use: No   OB History    Gravida Para Term Preterm AB TAB SAB Ectopic Multiple Living   2 2        2      Review of Systems  Constitutional: Positive  for fever and chills.  Respiratory: Positive for cough, chest tightness and shortness of breath.   Cardiovascular: Negative for chest pain.  Gastrointestinal: Positive for nausea. Negative for vomiting, abdominal pain and diarrhea.  Skin: Negative for rash.  All other systems reviewed and are negative.     Allergies  Amoxicillin-pot clavulanate; Ceftin; Cefuroxime; Cefuroxime axetil; and Risperidone and related  Home Medications   Prior to Admission medications   Medication Sig Start Date End Date Taking? Authorizing Provider  acetaminophen (TYLENOL) 500 MG tablet Take 1,000 mg by mouth as needed.   Yes Historical Provider, MD  alprazolam Duanne Moron) 2 MG tablet Take 2 mg by mouth 2 (two) times daily as needed (May take a 1 mg during the day but always takes 2 mg at bedtime.). For anxiety   Yes Historical Provider, MD  anastrozole (ARIMIDEX) 1 MG tablet Take 1 mg by mouth daily.     Yes Historical Provider, MD  cyanocobalamin (,VITAMIN B-12,) 1000 MCG/ML injection Inject 100 mcg into the muscle every 30 (thirty) days.    Yes Historical Provider, MD  docusate sodium (STOOL SOFTENER) 100 MG capsule Take 2 capsules (200 mg total) by mouth at bedtime. 06/25/12  Yes Rogene Houston, MD  esomeprazole (NEXIUM) 40 MG capsule Take 40 mg by mouth 2 (two) times daily before a meal.   Yes Historical Provider, MD  fish oil-omega-3 fatty acids 1000 MG capsule Take 1 g by mouth 2 (two) times daily.   Yes Historical Provider, MD  fluticasone (FLONASE) 50 MCG/ACT nasal spray Place 2 sprays into the nose daily.    Yes Historical Provider, MD  levocetirizine (XYZAL) 5 MG tablet TAKE ONE TABLET BY MOUTH AT BEDTIME. 03/18/14  Yes Estill Dooms, NP  lidocaine (LIDODERM) 5 % Place 1 patch onto the skin as needed. Remove & Discard patch within 12 hours or as directed by MD (only uses prn)   Yes Historical Provider, MD  metoprolol (TOPROL-XL) 100 MG 24 hr tablet Take 100 mg by mouth daily.     Yes Historical  Provider, MD  promethazine (PHENERGAN) 25 MG tablet Take 25 mg by mouth as needed for nausea.  03/10/13  Yes Historical Provider, MD  simvastatin (ZOCOR) 20 MG tablet Take 20 mg by mouth daily.    Yes Historical Provider, MD  warfarin (COUMADIN) 5 MG tablet Take 1.5 tablets (7.5 mg total) by mouth daily. Adjust as instructed 03/17/16  Yes Annia Belt, MD  levofloxacin [facility]) 500  MG tablet Take 1 tablet (500 mg total) by mouth daily. 04/20/16   Merryl Hacker, MD  ondansetron (ZOFRAN ODT) 4 MG disintegrating tablet Take 1 tablet (4 mg total) by mouth every 8 (eight) hours as needed for nausea or vomiting. 04/20/16   Merryl Hacker, MD   BP 122/89 mmHg  Pulse 83  Temp(Src) 99.9 F (37.7 C) (Oral)  Resp 24  Ht 5\' 2"  (1.575 m)  Wt 210 lb (95.255 kg)  BMI 38.40 kg/m2  SpO2 98% Physical Exam  Constitutional: She is oriented to person, place, and time. No distress.  Ill-appearing, nontoxic, no acute distress  HENT:  Head: Normocephalic and atraumatic.  Mouth/Throat: Oropharynx is clear and moist.  Eyes: Pupils are equal, round, and reactive to light.  Neck: Normal range of motion. Neck supple.  Cardiovascular: Normal rate, regular rhythm and normal heart sounds.   No murmur heard. Pulmonary/Chest: Effort normal and breath sounds normal. No respiratory distress. She has no wheezes.  Abdominal: Soft. Bowel sounds are normal. There is no tenderness. There is no rebound.  Musculoskeletal: She exhibits no edema.  Neurological: She is alert and oriented to person, place, and time.  Skin: Skin is warm and dry. No rash noted.  Psychiatric: She has a normal mood and affect.  Nursing note and vitals reviewed.   ED Course  Procedures (including critical care time) DIAGNOSTIC STUDIES: Oxygen Saturation is 94% on RA, normal by my interpretation.    COORDINATION OF CARE: 11:13 PM- Pt advised of plan for treatment and pt agrees. Pt will receive chest x-ray and lab work for further  evaluation.    Labs Review Labs Reviewed  BASIC METABOLIC PANEL - Abnormal; Notable for the following:    Sodium 134 (*)    Glucose, Bld 100 (*)    Creatinine, Ser 1.19 (*)    Calcium 8.5 (*)    GFR calc non Af Amer 50 (*)    GFR calc Af Amer 58 (*)    All other components within normal limits  URINALYSIS, ROUTINE W REFLEX MICROSCOPIC (NOT AT [facility]) - Abnormal; Notable for the following:    Hgb urine dipstick TRACE (*)    All other components within normal limits  PROTIME-INR - Abnormal; Notable for the following:    Prothrombin Time 21.8 (*)    INR 1.91 (*)    All other components within normal limits  URINE MICROSCOPIC-ADD ON - Abnormal; Notable for the following:    Squamous Epithelial / LPF 6-30 (*)    Bacteria, UA FEW (*)    All other components within normal limits  CBC WITH DIFFERENTIAL/PLATELET  LACTIC ACID, PLASMA  LACTIC ACID, PLASMA    Imaging Review Dg Chest 2 View  04/20/2016  CLINICAL DATA:  Ongoing fever and chills, onset 3 days ago. Cough and shortness of breath. EXAM: CHEST  2 VIEW COMPARISON:  01/03/2013 FINDINGS: Patchy infiltrates demonstrated in the right mid lung and left upper lung likely representing pneumonia. Heart size and pulmonary vascularity are normal. No blunting of costophrenic angles. No pneumothorax. Degenerative changes in the spine. IMPRESSION: Patchy infiltrates in the right mid lung and left upper lung likely representing pneumonia. Followup PA and lateral chest X-ray is recommended in 3-4 weeks following trial of antibiotic therapy to ensure resolution and exclude underlying malignancy. Electronically Signed   By: Lucienne Capers M.D.   On: 04/20/2016 00:48     Merryl Hacker, MD has personally reviewed and evaluated these images and lab results as  part of her medical decision-making.   EKG Interpretation   Date/Time:  Wednesday April 19 2016 20:18:01 EDT Ventricular Rate:  94 PR Interval:    QRS Duration: 100 QT Interval:  343 QTC  Calculation: 429 R Axis:   -11 Text Interpretation:  Sinus rhythm Left ventricular hypertrophy Confirmed  by HORTON  MD, Loma Sousa ([id number]) on 04/19/2016 10:58:46 PM      MDM   Final diagnoses:  Community acquired pneumonia   Patient presents with persistent fever, chills, cough. Ill-appearing but nontoxic. Vital signs notable for temperature of 100.2. Breath sounds are clear. Patient is currently on antibiotics for walking pneumonia labwork is largely reassuring. Patient was given fluids. Lactate normal. Chest x-ray shows evidence of patchy infiltrates concerning for pneumonia. Patient was given Levaquin. She was able to ambulate and maintain pulse ox greater than 90%. Feel that azithromycin not likely strong enough to cover for pneumonia represented on x-ray. Will broaden to Levaquin. Discontinue azithromycin use. Follow-up in 2-3 days if not improving.  After history, exam, and medical workup I feel the patient has been appropriately medically screened and is safe for discharge home. Pertinent diagnoses were discussed with the patient. Patient was given return precautions.  I personally performed the services described in this documentation, which was scribed in my presence. The recorded information has been reviewed and is accurate.   Merryl Hacker, MD 04/20/16 [phone]

## 2016-04-19 NOTE — ED Notes (Signed)
Pt reports last dose of Tylenol around 1800

## 2016-04-19 NOTE — ED Notes (Signed)
Pt c/o SOB, EKG given to MD [facility]. States she was given z-pack by PCP earlier this week without any improvement.

## 2016-04-19 NOTE — ED Notes (Signed)
Fever, cough, weakness X4 days with nausea

## 2016-04-20 DIAGNOSIS — R0602 Shortness of breath: Secondary | ICD-10-CM | POA: Diagnosis not present

## 2016-04-20 DIAGNOSIS — R05 Cough: Secondary | ICD-10-CM | POA: Diagnosis not present

## 2016-04-20 LAB — CBC WITH DIFFERENTIAL/PLATELET
BASOS ABS: 0 10*3/uL (ref 0.0–0.1)
Basophils Relative: 1 %
EOS ABS: 0 10*3/uL (ref 0.0–0.7)
EOS PCT: 1 %
HCT: 40.7 % (ref 36.0–46.0)
Hemoglobin: 13.8 g/dL (ref 12.0–15.0)
LYMPHS ABS: 1.4 10*3/uL (ref 0.7–4.0)
Lymphocytes Relative: 34 %
MCH: 30.1 pg (ref 26.0–34.0)
MCHC: 33.9 g/dL (ref 30.0–36.0)
MCV: 88.7 fL (ref 78.0–100.0)
MONO ABS: 0.2 10*3/uL (ref 0.1–1.0)
Monocytes Relative: 5 %
Neutro Abs: 2.5 10*3/uL (ref 1.7–7.7)
Neutrophils Relative %: 59 %
PLATELETS: 164 10*3/uL (ref 150–400)
RBC: 4.59 MIL/uL (ref 3.87–5.11)
RDW: 14 % (ref 11.5–15.5)
WBC: 4.1 10*3/uL (ref 4.0–10.5)

## 2016-04-20 LAB — BASIC METABOLIC PANEL
Anion gap: 7 (ref 5–15)
BUN: 15 mg/dL (ref 6–20)
CALCIUM: 8.5 mg/dL — AB (ref 8.9–10.3)
CO2: 26 mmol/L (ref 22–32)
Chloride: 101 mmol/L (ref 101–111)
Creatinine, Ser: 1.19 mg/dL — ABNORMAL HIGH (ref 0.44–1.00)
GFR calc Af Amer: 58 mL/min — ABNORMAL LOW (ref >60)
GFR, EST NON AFRICAN AMERICAN: 50 mL/min — AB (ref >60)
GLUCOSE: 100 mg/dL — AB (ref 65–99)
POTASSIUM: 4.1 mmol/L (ref 3.5–5.1)
SODIUM: 134 mmol/L — AB (ref 135–145)

## 2016-04-20 LAB — URINALYSIS, ROUTINE W REFLEX MICROSCOPIC
BILIRUBIN URINE: NEGATIVE
GLUCOSE, UA: NEGATIVE mg/dL
KETONES UR: NEGATIVE mg/dL
Leukocytes, UA: NEGATIVE
Nitrite: NEGATIVE
PROTEIN: NEGATIVE mg/dL
Specific Gravity, Urine: 1.015 (ref 1.005–1.030)
pH: 7 (ref 5.0–8.0)

## 2016-04-20 LAB — URINE MICROSCOPIC-ADD ON

## 2016-04-20 LAB — PROTIME-INR
INR: 1.91 — ABNORMAL HIGH (ref 0.00–1.49)
Prothrombin Time: 21.8 seconds — ABNORMAL HIGH (ref 11.6–15.2)

## 2016-04-20 LAB — LACTIC ACID, PLASMA: LACTIC ACID, VENOUS: 1 mmol/L (ref 0.5–1.9)

## 2016-04-20 MED ORDER — ONDANSETRON HCL 4 MG/2ML IJ SOLN
4.0000 mg | Freq: Once | INTRAMUSCULAR | Status: AC
  Administered 2016-04-20: 4 mg via INTRAVENOUS
  Filled 2016-04-20: qty 2

## 2016-04-20 MED ORDER — LEVOFLOXACIN 750 MG PO TABS
750.0000 mg | ORAL_TABLET | Freq: Once | ORAL | Status: AC
  Administered 2016-04-20: 750 mg via ORAL
  Filled 2016-04-20: qty 1

## 2016-04-20 MED ORDER — LEVOFLOXACIN 500 MG PO TABS: 500.0000 mg | ORAL_TABLET | Freq: Every day | ORAL | Status: DC

## 2016-04-20 MED ORDER — ONDANSETRON 4 MG PO TBDP: 4.0000 mg | ORAL_TABLET | Freq: Three times a day (TID) | ORAL | Status: DC | PRN

## 2016-04-20 NOTE — ED Notes (Signed)
Ambulate pt on room air with pulse Ox pt O2 sat ranged from 90% to 98%

## 2016-04-20 NOTE — Discharge Instructions (Signed)
You were seen today for fever and chills. He has evidence of pneumonia. Discontinued taking azithromycin. You will be started on Levaquin. Follow-up in 2-3 days if symptoms not improving.  Community-Acquired Pneumonia, Adult Pneumonia is an infection of the lungs. There are different types of pneumonia. One type can develop while a person is in a hospital. A different type, called community-acquired pneumonia, develops in people who are not, or have not recently been, in the hospital or other health care facility.  CAUSES Pneumonia may be caused by bacteria, viruses, or funguses. Community-acquired pneumonia is often caused by Streptococcus pneumonia bacteria. These bacteria are often passed from one person to another by breathing in droplets from the cough or sneeze of an infected person. RISK FACTORS The condition is more likely to develop in:  People who havechronic diseases, such as chronic obstructive pulmonary disease (COPD), asthma, congestive heart failure, cystic fibrosis, diabetes, or kidney disease.  People who haveearly-stage or late-stage HIV.  People who havesickle cell disease.  People who havehad their spleen removed (splenectomy).  People who havepoor Human resources officer.  People who havemedical conditions that increase the risk of breathing in (aspirating) secretions their own mouth and nose.   People who havea weakened immune system (immunocompromised).  People who smoke.  People whotravel to areas where pneumonia-causing germs commonly exist.  People whoare around animal habitats or animals that have pneumonia-causing germs, including birds, bats, rabbits, cats, and farm animals. SYMPTOMS Symptoms of this condition include:  Adry cough.  A wet (productive) cough.  Fever.  Sweating.  Chest pain, especially when breathing deeply or coughing.  Rapid breathing or difficulty breathing.  Shortness of breath.  Shaking chills.  Fatigue.  Muscle  aches. DIAGNOSIS Your health care provider will take a medical history and perform a physical exam. You may also have other tests, including:  Imaging studies of your chest, including X-rays.  Tests to check your blood oxygen level and other blood gases.  Other tests on blood, mucus (sputum), fluid around your lungs (pleural fluid), and urine. If your pneumonia is severe, other tests may be done to identify the specific cause of your illness. TREATMENT The type of treatment that you receive depends on many factors, such as the cause of your pneumonia, the medicines you take, and other medical conditions that you have. For most adults, treatment and recovery from pneumonia may occur at home. In some cases, treatment must happen in a hospital. Treatment may include:  Antibiotic medicines, if the pneumonia was caused by bacteria.  Antiviral medicines, if the pneumonia was caused by a virus.  Medicines that are given by mouth or through an IV tube.  Oxygen.  Respiratory therapy. Although rare, treating severe pneumonia may include:  Mechanical ventilation. This is done if you are not breathing well on your own and you cannot maintain a safe blood oxygen level.  Thoracentesis. This procedureremoves fluid around one lung or both lungs to help you breathe better. HOME CARE INSTRUCTIONS  Take over-the-counter and prescription medicines only as told by your health care provider.  Only takecough medicine if you are losing sleep. Understand that cough medicine can prevent your body's natural ability to remove mucus from your lungs.  If you were prescribed an antibiotic medicine, take it as told by your health care provider. Do not stop taking the antibiotic even if you start to feel better.  Sleep in a semi-upright position at night. Try sleeping in a reclining chair, or place a few pillows under your  head.  Do not use tobacco products, including cigarettes, chewing tobacco, and  e-cigarettes. If you need help quitting, ask your health care provider.  Drink enough water to keep your urine clear or pale yellow. This will help to thin out mucus secretions in your lungs. PREVENTION There are ways that you can decrease your risk of developing community-acquired pneumonia. Consider getting a pneumococcal vaccine if:  You are older than 57 years of age.  You are older than 57 years of age and are undergoing cancer treatment, have chronic lung disease, or have other medical conditions that affect your immune system. Ask your health care provider if this applies to you. There are different types and schedules of pneumococcal vaccines. Ask your health care provider which vaccination option is best for you. You may also prevent community-acquired pneumonia if you take these actions:  Get an influenza vaccine every year. Ask your health care provider which type of influenza vaccine is best for you.  Go to the dentist on a regular basis.  Wash your hands often. Use hand sanitizer if soap and water are not available. SEEK MEDICAL CARE IF:  You have a fever.  You are losing sleep because you cannot control your cough with cough medicine. SEEK IMMEDIATE MEDICAL CARE IF:  You have worsening shortness of breath.  You have increased chest pain.  Your sickness becomes worse, especially if you are an older adult or have a weakened immune system.  You cough up blood.   This information is not intended to replace advice given to you by your health care provider. Make sure you discuss any questions you have with your health care provider.   Document Released: 09/25/2005 Document Revised: 06/16/2015 Document Reviewed: 01/20/2015 Elsevier Interactive Patient Education [employer].

## 2016-05-15 ENCOUNTER — Telehealth (INDEPENDENT_AMBULATORY_CARE_PROVIDER_SITE_OTHER): Payer: Medicare Other | Admitting: Pharmacist

## 2016-05-15 DIAGNOSIS — D6851 Activated protein C resistance: Secondary | ICD-10-CM | POA: Diagnosis not present

## 2016-05-15 DIAGNOSIS — E538 Deficiency of other specified B group vitamins: Secondary | ICD-10-CM | POA: Diagnosis not present

## 2016-05-15 LAB — POCT INR: INR: 3.2

## 2016-05-15 NOTE — Telephone Encounter (Signed)
Reviewed Thanks DrG 

## 2016-05-15 NOTE — Telephone Encounter (Signed)
Anticoagulation Management Jessica Reilly is a 55 y.o. female who reports to the clinic for monitoring of warfarin treatment.    Indication: DVT and factor V Leiden deficiency Duration: indefinite  Anticoagulation Clinic Visit History: Patient does not report signs/symptoms of bleeding or thromboembolism. Patient was treated for CAP in July, finished course of antibiotics (levofloxacin) 04/25/16, states her appetite has gone down and plans to now resume normal dietary vitamin K intake.  ASSESSMENT Recent Results: The most recent result is correlated with 52.5 mg per week: Lab Results  Component Value Date   INR 3.2    INR 1.91 (H) 04/19/2016   INR 2.6 04/18/2016   INR 1.1 03/09/2016   PROTIME 27.6 (H) 07/30/2013   Anticoagulation Dosing: INR as of 11/16/2015 and Previous Dosing Information    INR Dt INR Goal [name] Sun Mon Tue Wed Thu Fri Sat   11/16/2015 2.9 2.0-3.0 52.5 mg 7.5 mg 7.5 mg 7.5 mg 7.5 mg 7.5 mg 7.5 mg 7.5 mg    Previous description   Dr. [name]   Anticoagulation Dose Instructions as of 11/16/2015      Total Sun Mon Tue Wed Thu Fri Sat   New Dose 52.5 mg 7.5 mg 7.5 mg 7.5 mg 7.5 mg 7.5 mg 7.5 mg 7.5 mg     (5 mg x 1.5)  (5 mg x 1.5)  (5 mg x 1.5)  (5 mg x 1.5)  (5 mg x 1.5)  (5 mg x 1.5)  (5 mg x 1.5)                         Description   Dr. [name]     INR today: Supratherapeutic potentially dietary-related  PLAN Weekly dose was unchanged, patient was advised to resume routine dietary vitamin K intake.  Patient advised to contact clinic or seek medical attention if signs/symptoms of bleeding or thromboembolism occur.  Follow-up 1 week or sooner if concerns arise, patient also advised to schedule appointment with Dr. Dennison Nancy verbalized understanding.   Kim,Jennifer J

## 2016-05-22 ENCOUNTER — Ambulatory Visit: Payer: Self-pay | Admitting: Pharmacist

## 2016-05-23 ENCOUNTER — Ambulatory Visit (INDEPENDENT_AMBULATORY_CARE_PROVIDER_SITE_OTHER): Payer: Medicare Other | Admitting: Oncology

## 2016-05-23 ENCOUNTER — Encounter: Payer: Self-pay | Admitting: Oncology

## 2016-05-23 VITALS — BP 134/86 | HR 68 | Temp 97.7°F | Ht 61.5 in | Wt 208.1 lb

## 2016-05-23 DIAGNOSIS — Z170 Estrogen receptor positive status [ER+]: Secondary | ICD-10-CM

## 2016-05-23 DIAGNOSIS — F319 Bipolar disorder, unspecified: Secondary | ICD-10-CM

## 2016-05-23 DIAGNOSIS — C50912 Malignant neoplasm of unspecified site of left female breast: Secondary | ICD-10-CM

## 2016-05-23 DIAGNOSIS — Z7901 Long term (current) use of anticoagulants: Secondary | ICD-10-CM

## 2016-05-23 DIAGNOSIS — Z86718 Personal history of other venous thrombosis and embolism: Secondary | ICD-10-CM | POA: Diagnosis not present

## 2016-05-23 DIAGNOSIS — Z853 Personal history of malignant neoplasm of breast: Secondary | ICD-10-CM

## 2016-05-23 DIAGNOSIS — I82622 Acute embolism and thrombosis of deep veins of left upper extremity: Secondary | ICD-10-CM

## 2016-05-23 NOTE — Patient Instructions (Addendum)
Continue to monitor coumadin - every 2 months; check routine blood count in six months OK to hold coumadin for 5 days before spinal epidural injection; can resume night of procedure if everything goes smoothly Return visit 1 year

## 2016-05-24 NOTE — Progress Notes (Signed)
Hematology and Oncology Follow Up Visit  Jessica Reilly [id number] 25-Oct-1960 55 y.o. 05/24/2016 11:39 AM   Principle Diagnosis: Encounter Diagnoses  Name Primary?  . Deep vein thrombosis (DVT) of left upper extremity, unspecified chronicity, unspecified vein (HCC) Yes  . Lobular carcinoma of breast, stage 2, estrogen receptor positive, left ([place])   . Chronic anticoagulation   Clinical summary: 54 year old woman with a history of a stage II, T2 N0, ER/PR positive, HER-2 negative cancer of the right breast status post right modified radical mastectomy and elective simple left mastectomy with subsequent bilateral oophorectomy. She received postoperative radiation to the right chest wall, axilla, and supraclavicular area. She sustained a left upper extremity DVT following left breast surgery. She tested negative for the BRCA1 and BRCA 2 genes by her history.  She received neoadjuvant CEF chemotherapy for 5 cycles. Initial tumor measured 4.1 x 3.1 x 2.7 cm on mammogram done 06/25/2007. She had her surgery at [facility] in [place].  Final tumor size was 1.7 cm. Grade 3. 0 of 3 sentinel lymph nodes tested negative. She had very poor tolerance to the treatments. She was then put on hormonal therapy with Arimidex.  Due to the perioperative left upper extremity DVT (as noted surgery on the right,) lab testing was done and she was found to be a heterozygote for the factor V Leiden gene mutation. She was anticoagulated and she has been kept on therapeutic Coumadin.  Although the risk of thrombosis on an aromatase inhibitor is less then on tamoxifen, given her history, I felt it was reasonable to continue the Coumadin until she completes a planned 5 year course of Arimidex in September 2014. She has elected to have her Coumadin monitored through our office.   Her oncologist at [facility]. Dr. Mercie Eon felt that the Arimidex hormonal therapy should be continued past 5 years and we now  have data from clinical trials to support extending hormonal therapy for 10 years whether it be tamoxifen or an aromatase inhibitor. Dr. Purcell Nails left the [facility] and her new oncologist is Dr. Karie Georges.  Interim History:   She has had no major medical problems over the last year since I have seen her. She continues to get her Coumadin monitored through our office. She was reluctant to change to a Xa inhibitor. She has had no clinical bleeding on the Coumadin. She is seeing Dr. Gerarda Gunther for her chronic back pain. A epidural steroid injection is planned. I filled out a approval to proceed with the procedure but I do not want her to hold her Coumadin for more than 5 days. She can resume the Coumadin the night of the procedure if everything goes well. She has noticed no new breast lumps. She gets her breast exams by the oncologist in [place].  She informs me that her daughter in her early 64s recently had a DVT in her leg and was also found to be positive for the 5 Leiden mutation. She was also found to have what is likely a lupus type anticoagulant although the patient couldn't really verbalize this except indirectly.  Medications: reviewed  Allergies:  Allergies  Allergen Reactions  . Amoxicillin-Pot Clavulanate Rash  . Ceftin Rash  . Cefuroxime Rash  . Cefuroxime Axetil Rash  . Risperidone And Related Other (See Comments)    Has medical contraindications    Review of Systems: See interim history Remaining ROS negative:   Physical Exam: Blood pressure 134/86, pulse 68, temperature 97.7 F (36.5 C), temperature  source Oral, height 5' 1.5" (1.562 m), weight 208 lb 1.6 oz (94.4 kg), SpO2 98 %. Wt Readings from Last 3 Encounters:  05/23/16 208 lb 1.6 oz (94.4 kg)  04/19/16 210 lb (95.3 kg)  01/10/16 215 lb (97.5 kg)     General appearance: Pleasant but anxious Caucasian woman HENNT: Pharynx no erythema, exudate, mass, or ulcer. No thyromegaly or thyroid nodules Lymph  nodes: No cervical, supraclavicular, or axillary lymphadenopathy Breasts:  Lungs: Clear to auscultation, resonant to percussion throughout Heart: Regular rhythm, no murmur, no gallop, no rub, no click, no edema Abdomen: Soft, nontender, normal bowel sounds, no mass, no organomegaly Extremities: No edema, no calf tenderness Musculoskeletal: no joint deformities GU:  Vascular: Carotid pulses 2+, no bruits, distal pulses: Dorsalis pedis 1+ symmetric Neurologic: Alert, oriented, PERRLA, optic discs sharp and vessels normal, no hemorrhage or exudate, cranial nerves grossly normal, motor strength 5 over 5, reflexes 1+ symmetric, upper body coordination normal, gait normal, Skin: No rash or ecchymosis  Lab Results: CBC W/Diff    Component Value Date/Time   WBC 4.1 04/19/2016 2351   RBC 4.59 04/19/2016 2351   HGB 13.8 04/19/2016 2351   HGB 12.9 02/20/2013 1342   HCT 40.7 04/19/2016 2351   HCT 38.8 02/20/2013 1342   PLT 164 04/19/2016 2351   PLT 275 02/20/2013 1342   MCV 88.7 04/19/2016 2351   MCV 88.6 02/20/2013 1342   MCH 30.1 04/19/2016 2351   MCHC 33.9 04/19/2016 2351   RDW 14.0 04/19/2016 2351   RDW 13.9 02/20/2013 1342   LYMPHSABS 1.4 04/19/2016 2351   LYMPHSABS 2.9 02/20/2013 1342   MONOABS 0.2 04/19/2016 2351   MONOABS 0.3 02/20/2013 1342   EOSABS 0.0 04/19/2016 2351   EOSABS 0.2 02/20/2013 1342   BASOSABS 0.0 04/19/2016 2351   BASOSABS 0.0 02/20/2013 1342     Chemistry      Component Value Date/Time   NA 134 (L) 04/19/2016 2351   NA 142 02/20/2013 1342   K 4.1 04/19/2016 2351   K 4.4 02/20/2013 1342   CL 101 04/19/2016 2351   CL 105 02/20/2013 1342   CO2 26 04/19/2016 2351   CO2 26 02/20/2013 1342   BUN 15 04/19/2016 2351   BUN 10.0 02/20/2013 1342   CREATININE 1.19 (H) 04/19/2016 2351   CREATININE 1.06 06/22/2014 1043   CREATININE 1.2 (H) 02/20/2013 1342      Component Value Date/Time   CALCIUM 8.5 (L) 04/19/2016 2351   CALCIUM 9.5 02/20/2013 1342   ALKPHOS  90 06/22/2014 1043   ALKPHOS 100 02/20/2013 1342   AST 16 06/22/2014 1043   AST 29 02/20/2013 1342   ALT 20 06/22/2014 1043   ALT 33 02/20/2013 1342   BILITOT 0.3 06/22/2014 1043   BILITOT 0.37 02/20/2013 1342    INR 3.2 on 05/15/2016 on current Coumadin dose 7.5 mg daily   Radiological Studies: No results found.  Impression:  #1. Congenital coagulopathy: heterozygote status for factor V Leiden gene mutation.  Status post isolated episode of left  upper extremity thrombosis following elective, left, simple mastectomy.  Ongoing hormonal therapy with Arimidex which has a low thrombogenic potential.  We had the same discussion that we have every time I see her. She wants to stay on anticoagulation indefinitely but I told her that I didn't think that this was necessary. She will stay on for an additional 1 year until she completes 10 years total adjuvant hormonal therapy. I encouraged her to stop at that point. She  now tells me that she believes she had a unprovoked right upper extremity DVT which antedated her breast cancer surgery. I do not have any documentation of this.   #2. T2, N0, M0 stage II, ER also did, HER-2 negative, grade 3, cancer of the right breast. Treated as outlined above.  She remains free of any obvious new disease now out  9 years from initial diagnosis.  Plan is to complete 10 years of adjuvant therapy.  #3. Bipolar disorder  Recommendation of psychiatrist who evaluated her was to go on Lexapro but she has not done this. She is using when necessary Xanax.  CC: Patient Care Team: Sharilyn Sites, MD as PCP - General (Family Medicine)   Annia Belt, MD 8/16/201711:39 AM

## 2016-06-01 DIAGNOSIS — C50011 Malignant neoplasm of nipple and areola, right female breast: Secondary | ICD-10-CM | POA: Diagnosis not present

## 2016-06-01 DIAGNOSIS — C50012 Malignant neoplasm of nipple and areola, left female breast: Secondary | ICD-10-CM | POA: Diagnosis not present

## 2016-06-02 DIAGNOSIS — Z79811 Long term (current) use of aromatase inhibitors: Secondary | ICD-10-CM | POA: Diagnosis not present

## 2016-06-02 DIAGNOSIS — J45909 Unspecified asthma, uncomplicated: Secondary | ICD-10-CM | POA: Diagnosis not present

## 2016-06-02 DIAGNOSIS — Z08 Encounter for follow-up examination after completed treatment for malignant neoplasm: Secondary | ICD-10-CM | POA: Diagnosis not present

## 2016-06-02 DIAGNOSIS — C50919 Malignant neoplasm of unspecified site of unspecified female breast: Secondary | ICD-10-CM | POA: Diagnosis not present

## 2016-06-02 DIAGNOSIS — Z79899 Other long term (current) drug therapy: Secondary | ICD-10-CM | POA: Diagnosis not present

## 2016-06-02 DIAGNOSIS — Z853 Personal history of malignant neoplasm of breast: Secondary | ICD-10-CM | POA: Diagnosis not present

## 2016-06-02 DIAGNOSIS — Z9221 Personal history of antineoplastic chemotherapy: Secondary | ICD-10-CM | POA: Diagnosis not present

## 2016-06-02 DIAGNOSIS — Z923 Personal history of irradiation: Secondary | ICD-10-CM | POA: Diagnosis not present

## 2016-06-02 DIAGNOSIS — I10 Essential (primary) hypertension: Secondary | ICD-10-CM | POA: Diagnosis not present

## 2016-06-02 DIAGNOSIS — Z7901 Long term (current) use of anticoagulants: Secondary | ICD-10-CM | POA: Diagnosis not present

## 2016-06-02 DIAGNOSIS — Z9013 Acquired absence of bilateral breasts and nipples: Secondary | ICD-10-CM | POA: Diagnosis not present

## 2016-06-07 LAB — PROTIME-INR

## 2016-06-15 DIAGNOSIS — E538 Deficiency of other specified B group vitamins: Secondary | ICD-10-CM | POA: Diagnosis not present

## 2016-06-17 DIAGNOSIS — D6851 Activated protein C resistance: Secondary | ICD-10-CM | POA: Diagnosis not present

## 2016-06-19 ENCOUNTER — Ambulatory Visit: Payer: Self-pay | Admitting: Student-PharmD

## 2016-06-19 NOTE — Progress Notes (Signed)
Anticoagulation Management (Phone f/u) Jessica Reilly is a 55 y.o. female who reported INR of 3.2 last Friday (9/8).    Indication: factor V Leiden deficiency Duration: indefinite  Anticoagulation Clinic Visit History: Patient does not report signs/symptoms of bleeding or thromboembolism No recent changes in diet, medications, lifestyle Anticoagulation Episode Summary    Current INR goal:   2.0-3.0  TTR:   75.6 % (4.1 y)  Next INR check:   11/30/2015  INR from last check:   2.9 (11/16/2015)  Most recent INR:    3.2! (05/15/2016)  Weekly max dose:     Target end date:   04/11/2015  INR check location:     Preferred lab:     Send INR reminders to:   [facility]     Comments:         Anticoagulation Care Providers    Provider Role Specialty Phone number   Annia Belt, MD Referring Oncology [phone]     ASSESSMENT Recent Results: The most recent result is correlated with 52.5 mg per week: Lab Results  Component Value Date   INR 3.2 05/15/2016   INR 1.91 (H) 04/19/2016   INR 2.6 04/18/2016   PROTIME 27.6 (H) 07/30/2013    Anticoagulation Dosing: INR as of 11/16/2015 and Previous Dosing Information    INR Dt INR Goal [name] Sun Mon Tue Wed Thu Fri Sat   11/16/2015 2.9 2.0-3.0 52.5 mg 7.5 mg 7.5 mg 7.5 mg 7.5 mg 7.5 mg 7.5 mg 7.5 mg    Previous description   Dr. [name]   Anticoagulation Dose Instructions as of 11/16/2015      Total Sun Mon Tue Wed Thu Fri Sat   New Dose 52.5 mg 7.5 mg 7.5 mg 7.5 mg 7.5 mg 7.5 mg 7.5 mg 7.5 mg     (5 mg x 1.5)  (5 mg x 1.5)  (5 mg x 1.5)  (5 mg x 1.5)  (5 mg x 1.5)  (5 mg x 1.5)  (5 mg x 1.5)                         Description   Dr. [name]     INR today: Supratherapeutic  PLAN Weekly dose was unchanged. INR is slightly supratherapeutic; consider dose decrease at next INR check if continues to rise. Patient advised to contact clinic or seek medical attention if signs/symptoms of bleeding or  thromboembolism occur.  Patient verbalized understanding by repeating back information and was advised to contact me if further medication-related questions arise. Patient was also provided an information handout.  Follow-up Pt will f/u with INR via telephone  Jessica Reilly  5 minute call with the patient during the encounter. 25% of time spent on education. 75% of time was spent on discussing dose regimen.

## 2016-06-21 NOTE — Progress Notes (Signed)
Patient was contacted by Terald Sleeper, PharmD candidate. I agree with the assessment and plan of care documented.

## 2016-07-05 ENCOUNTER — Ambulatory Visit (INDEPENDENT_AMBULATORY_CARE_PROVIDER_SITE_OTHER): Payer: Medicare Other | Admitting: Psychiatry

## 2016-07-05 ENCOUNTER — Encounter (HOSPITAL_COMMUNITY): Payer: Self-pay | Admitting: Psychiatry

## 2016-07-05 DIAGNOSIS — F3130 Bipolar disorder, current episode depressed, mild or moderate severity, unspecified: Secondary | ICD-10-CM | POA: Diagnosis not present

## 2016-07-05 NOTE — Progress Notes (Signed)
Patient:                           Jessica Reilly    DOB:                               [birth year]  MR Number:                  [id number]  Location:                        [place]:  [address] [place],  [place], [address]  Start:                              Wednesday 07/05/2016 11:02 AM End:                                Wednesday9/27/2017 11:55 AM  Provider/Observer:                           Maurice Small, MSW, LCSW   Chief Complaint:                                   Chief Complaint  Patient presents with  . Depression    Reason For Service:                         Patient is a returning patient to this practice and has received medication management and outpatient therapy for treatment of Bipolar Disorder. here. She last was seen in 2014. She reports she discontinued services due to lack of insurance coverage. Per her report, she became eligible for Medicaid this past August. She is resuming services today due to continued symptoms of depression and anxiety. She reports constant worry about her health. She is a cancer survivor and has had a double mastectomy. She has been in remission many years but constantly fears cancer will return. She reports multiple health issues that have resulted from side effects from radiation. She also reports poor self-image due to double mastectomy and decreased functioning due to  chronic back, knee, and shouldr pain. Patient worries about a variety of issues and reports memory difficulty, poor concentration, restlessness, mood swings, irritability, and anger. She continues to experience anxiety being away from her home. She also experiences periods of depression staying in the bed, losing interest in activities, experiencing no pleasure. Patient has a history of self-injurious behaviors but denies engaging in any of these behaviors in recent months. She denies suicidal/homicidal ideations. She does report sometimes having disturbing  violent intrusive thoughts with no intent or plan to act upon them.  She denies any hallucinations.   Interventions Strategy:                    Supportive therapy,   Participation Level:                           Active  Participation Quality:  Monopolizing                            Behavioral Observation:                  Restless, pressured rapid speech, circumstantiality, anxious,   Current Psychosocial Factors:       Multiple stressors - multiple health issues, financial stress regarding mortgage, concerns about children/grandchildren.  Content of Session:                           reestablished rapport, reviewed symptoms, assisted patient identify stressors, discussed referral to psychiatrist for medication evaluation,   Current Status:                                   excessive worry, memory difficulty, poor concentration, restlessness, mood swings, anger, irritability, depressed mood, decreased interest in activities, loss of pleasure,  Suicidal/Homicidal:   No. Patient agrees to call this practice, call 911, or have someone take her to the emergency room should symptoms worsen.  Patient Progress:                              Patient reports feeling stressed and overwhelmed. She constantly fears cancer will return and states it is a matter of when and not if. She expresses frustration she isn't able to tolerate certain medications and is more at risk for blood clots due to having a blood disorder. As a result, there are limited procedures patient can pursue that possibly could alleviate some of her pain per patient's report. She constantly worries and reports feelings of hopelessness and helplessness. She reports she does try to push self, go outside, and talk to relatives when she is depressed. continued depressed mood. Patient reports on psychotropic medication currently taking is Xanax.   Plan:     Return in 2 weeks, Patient agrees to schedule appointment  with psychiatrist for medication evaluation   Target Goals:                                     1. Reestablish rapport  2. Identify sources of stress and anxiety  Last Reviewed:                        Goals Addressed Today:                 1,2   Impression/Diagnosis:                     The patient has a long-standing history of symptoms of depression and anxiety. She has a previous diagnosis of bipolar disorder. Current symptoms include excessive worry, memory difficulty, poor concentration, restlessness, mood swings, anger, irritability, depressed mood, decreased interest in activities, and loss of pleasure. She also sometimes experiences disturbing intrusive thoughts.   Diagnosis:                  Axis I:  Bipolar disorder, rule out OCD and generalized anxiety disorder  Axis II: Deferred

## 2016-07-06 LAB — PROTIME-INR: INR: 3.2

## 2016-07-20 ENCOUNTER — Telehealth (INDEPENDENT_AMBULATORY_CARE_PROVIDER_SITE_OTHER): Payer: Medicare Other | Admitting: Pharmacist

## 2016-07-20 DIAGNOSIS — I82729 Chronic embolism and thrombosis of deep veins of unspecified upper extremity: Secondary | ICD-10-CM | POA: Diagnosis not present

## 2016-07-20 DIAGNOSIS — D6851 Activated protein C resistance: Secondary | ICD-10-CM | POA: Diagnosis not present

## 2016-07-20 DIAGNOSIS — Z7901 Long term (current) use of anticoagulants: Secondary | ICD-10-CM

## 2016-07-20 LAB — POCT INR: INR: 2.9

## 2016-07-20 NOTE — Progress Notes (Signed)
Anticoagulation Management Jessica Reilly is a 55 y.o. female who reports to the clinic for monitoring of warfarin treatment.    Indication: factor V Leiden deficiency,DVT history Duration: indefinite  Anticoagulation Clinic Visit History: Patient does not report signs/symptoms of bleeding or thromboembolism   Anticoagulation Episode Summary    Current INR goal:   2.0-3.0  TTR:   73.1 % (4.3 y)  Next INR check:   08/03/2016  INR from last check:   2.9 (07/20/2016)  Weekly max dose:     Target end date:   04/11/2015  INR check location:     Preferred lab:     Send INR reminders to:   [facility]     Comments:         Anticoagulation Care Providers    Provider Role Specialty Phone number   Annia Belt, MD Referring Oncology [phone]     ASSESSMENT Recent Results: The most recent result is correlated with 52.5 mg per week: Lab Results  Component Value Date   INR 2.9 07/20/2016   INR 3.2 06/16/2016   INR 3.2 05/15/2016   PROTIME 27.6 (H) 07/30/2013   Anticoagulation Dosing: INR as of 07/20/2016 and Previous Dosing Information    INR Dt INR Goal [name] Sun Mon Tue Wed Thu Fri Sat   07/20/2016 2.9 2.0-3.0 52.5 mg 7.5 mg 7.5 mg 7.5 mg 7.5 mg 7.5 mg 7.5 mg 7.5 mg    Previous description   Dr. [name]   Anticoagulation Dose Instructions as of 07/20/2016      Total Sun Mon Tue Wed Thu Fri Sat   New Dose 52.5 mg 7.5 mg 7.5 mg 7.5 mg 7.5 mg 7.5 mg 7.5 mg 7.5 mg     (5 mg x 1.5)  (5 mg x 1.5)  (5 mg x 1.5)  (5 mg x 1.5)  (5 mg x 1.5)  (5 mg x 1.5)  (5 mg x 1.5)                         Description   Dr. [name]     INR today: Therapeutic  PLAN Weekly dose was unchanged   Patient advised to contact clinic or seek medical attention if signs/symptoms of bleeding or thromboembolism occur.  Patient verbalized understanding by repeating back information and was advised to contact me if further medication-related questions arise.  Patient was also provided an information handout.  Follow-up 2 weeks  Onur Mori J

## 2016-07-20 NOTE — Telephone Encounter (Signed)
Reviewed Thanks Drg 

## 2016-07-24 ENCOUNTER — Ambulatory Visit (INDEPENDENT_AMBULATORY_CARE_PROVIDER_SITE_OTHER): Payer: Medicare Other | Admitting: Psychiatry

## 2016-07-24 ENCOUNTER — Encounter (HOSPITAL_COMMUNITY): Payer: Self-pay | Admitting: Psychiatry

## 2016-07-24 DIAGNOSIS — F3130 Bipolar disorder, current episode depressed, mild or moderate severity, unspecified: Secondary | ICD-10-CM

## 2016-07-24 DIAGNOSIS — E538 Deficiency of other specified B group vitamins: Secondary | ICD-10-CM | POA: Diagnosis not present

## 2016-07-24 NOTE — Progress Notes (Signed)
Patient:                           Jessica Reilly    DOB:                               24-Jul-1961  MR Number:                  [id number]  Location:                        [place]:  [address] [place],  [place], [address]  Start:                               Monday 07/24/2016   11:00 AM  End:                                Monday 07/24/2016   12:00 PM      Provider/Observer:                           Maurice Small, MSW, LCSW   Chief Complaint:                                   Chief Complaint  Patient presents with  . Depression    Reason For Service:                         Patient is a returning patient to this practice and has received medication management and outpatient therapy for treatment of Bipolar Disorder. She last was seen in 2014. She reports she discontinued services due to lack of insurance coverage. Per her report, she became eligible for Medicaid this past August. She is resuming services today due to continued symptoms of depression and anxiety. She reports constant worry about her health. She is a cancer survivor and has had a double mastectomy. She has been in remission many years but constantly fears cancer will return. She reports multiple health issues that have resulted from side effects from radiation. She also reports poor self-image due to double mastectomy and decreased functioning due to  chronic back, knee, and shouldr pain. Patient worries about a variety of issues and reports memory difficulty, poor concentration, restlessness, mood swings, irritability, and anger. She continues to experience anxiety being away from her home. She also experiences periods of depression staying in the bed, losing interest in activities, experiencing no pleasure. Patient has a history of self-injurious behaviors but denies engaging in any of these behaviors in recent months. She denies suicidal/homicidal ideations. She does report sometimes having disturbing  violent intrusive thoughts with no intent or plan to act upon them.  She denies any hallucinations.   Interventions Strategy:                    Supportive therapy,   Participation Level:                           Active  Participation Quality:  Monopolizing                            Behavioral Observation:                  Restless, pressured rapid speech, circumstantiality, anxious,   Current Psychosocial Factors:       Multiple stressors - multiple health issues, financial stress regarding mortgage, concerns about children/grandchildren, estranged relationship with daughter,  Content of Session:                           reviewed symptoms, assisted patient identify strengths and supports, began to develop treatment plan, explored patient's interactions with others, discussed rationale for and practiced controlled breathing,   Current Status:                                   excessive worry, memory difficulty, poor concentration, restlessness, mood swings, anger, irritability, depressed mood, decreased interest in activities, loss of pleasure,  Suicidal/Homicidal:   No.   Patient Progress:                              Poor. Patient reports no change in symptoms since last session. She continues to feel stressed and overwhelmed. She constantly fears cancer will return and reports poor self image due to double mastectomy. She has decided to have nerve block injection in back this Thursday,10/12 and expresses some anxiety regarding this. She   expresses continued worry about finances and concerns about family. She expresses disappointment and frustration she is not where she thought she would have been in life at this point. She constantly worries and reports feelings of hopelessness and helplessness. Patient reports taking Xanax.    Plan:     Return in 2 weeks, Patient agrees to keep appointment with psychiatrist for medication evaluation   Target Goals:                                      1. Learn and implement calming skills to manage overall anxiety  Last Reviewed:                        Goals Addressed Today:                 1  Impression/Diagnosis:                     The patient has a long-standing history of symptoms of depression and anxiety. She has a previous diagnosis of bipolar disorder. Current symptoms include excessive worry, memory difficulty, poor concentration, restlessness, mood swings, anger, irritability, depressed mood, decreased interest in activities, and loss of pleasure. She also sometimes experiences disturbing intrusive thoughts.   Diagnosis:                  Axis I:  Bipolar disorder, rule out OCD and generalized anxiety disorder  Axis II: Deferred

## 2016-07-25 ENCOUNTER — Encounter (HOSPITAL_COMMUNITY): Payer: Self-pay | Admitting: Psychiatry

## 2016-07-25 ENCOUNTER — Ambulatory Visit (INDEPENDENT_AMBULATORY_CARE_PROVIDER_SITE_OTHER): Payer: Medicare Other | Admitting: Psychiatry

## 2016-07-25 VITALS — BP 131/82 | HR 77 | Ht 61.5 in | Wt 208.0 lb

## 2016-07-25 DIAGNOSIS — Z818 Family history of other mental and behavioral disorders: Secondary | ICD-10-CM | POA: Diagnosis not present

## 2016-07-25 DIAGNOSIS — F3132 Bipolar disorder, current episode depressed, moderate: Secondary | ICD-10-CM | POA: Insufficient documentation

## 2016-07-25 DIAGNOSIS — Z79899 Other long term (current) drug therapy: Secondary | ICD-10-CM | POA: Diagnosis not present

## 2016-07-25 DIAGNOSIS — Z791 Long term (current) use of non-steroidal anti-inflammatories (NSAID): Secondary | ICD-10-CM | POA: Diagnosis not present

## 2016-07-25 DIAGNOSIS — Z811 Family history of alcohol abuse and dependence: Secondary | ICD-10-CM

## 2016-07-25 MED ORDER — QUETIAPINE FUMARATE 50 MG PO TABS: 50.0000 mg | ORAL_TABLET | Freq: Every day | ORAL | 0 refills | Status: DC
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
## 2016-07-25 NOTE — Patient Instructions (Signed)
1. Start quetiapine 50 mg at night 2. Would recommend decrease Xanax 3. Return to clinic in one month

## 2016-07-27 ENCOUNTER — Encounter: Payer: Self-pay | Admitting: Physical Medicine and Rehabilitation

## 2016-07-27 DIAGNOSIS — M47817 Spondylosis without myelopathy or radiculopathy, lumbosacral region: Secondary | ICD-10-CM | POA: Diagnosis not present

## 2016-07-27 DIAGNOSIS — C50912 Malignant neoplasm of unspecified site of left female breast: Secondary | ICD-10-CM | POA: Diagnosis not present

## 2016-07-27 DIAGNOSIS — D6851 Activated protein C resistance: Secondary | ICD-10-CM | POA: Diagnosis not present

## 2016-07-27 DIAGNOSIS — M545 Low back pain: Secondary | ICD-10-CM | POA: Diagnosis not present

## 2016-07-27 DIAGNOSIS — I82623 Acute embolism and thrombosis of deep veins of upper extremity, bilateral: Secondary | ICD-10-CM | POA: Diagnosis not present

## 2016-07-27 DIAGNOSIS — Z7901 Long term (current) use of anticoagulants: Secondary | ICD-10-CM | POA: Diagnosis not present

## 2016-07-27 DIAGNOSIS — G894 Chronic pain syndrome: Secondary | ICD-10-CM | POA: Diagnosis not present

## 2016-07-27 DIAGNOSIS — M79651 Pain in right thigh: Secondary | ICD-10-CM | POA: Diagnosis not present

## 2016-08-01 ENCOUNTER — Telehealth: Payer: Self-pay | Admitting: *Deleted

## 2016-08-01 NOTE — Telephone Encounter (Signed)
Lab done outside [facility] Done 10/19 INR  1.3 PT    16.2 Placed in dr granfortuna's mailbox Called dr [name] and informed him He called back Called pt at both #'s, no answer and no vmail, she should come to [facility] and have repeat lab to verify, ask about new foods and medications

## 2016-08-01 NOTE — Telephone Encounter (Signed)
Patient stated she recently restarted warfarin after low-back injection (Thurs. 07/27/16). INR 1.3 today after 5 doses. Patient will re-check INR tomorrow morning at 9am. Patient will be able to come to clinic Friday (08/04/16) for INR check. No other recent changes reported or signs/symptoms of bleeding or thromboembolism.

## 2016-08-02 ENCOUNTER — Telehealth (INDEPENDENT_AMBULATORY_CARE_PROVIDER_SITE_OTHER): Payer: Medicare Other | Admitting: Pharmacist

## 2016-08-02 DIAGNOSIS — D6851 Activated protein C resistance: Secondary | ICD-10-CM

## 2016-08-02 DIAGNOSIS — Z7901 Long term (current) use of anticoagulants: Secondary | ICD-10-CM

## 2016-08-02 DIAGNOSIS — I82629 Acute embolism and thrombosis of deep veins of unspecified upper extremity: Secondary | ICD-10-CM

## 2016-08-02 LAB — POCT INR: INR: 2.3

## 2016-08-02 NOTE — Telephone Encounter (Signed)
charsetta will you please schedule this pt for fri for INR check per dr Maudie Mercury

## 2016-08-02 NOTE — Telephone Encounter (Signed)
Reviewed and I agree Thanks DrG

## 2016-08-02 NOTE — Patient Instructions (Signed)
Patient educated about medication as defined in this encounter and verbalized understanding by repeating back instructions provided.   

## 2016-08-02 NOTE — Telephone Encounter (Signed)
Anticoagulation Management Jessica Reilly is a 56 y.o. female who was contacted via telephone for monitoring of warfarin treatment.  Clarification, previous INR 1.3 reported by patient was from 07/27/16 prior to re-initiation of warfarin. Warfarin had previously been withheld due to low-back steroid injection. As of today, since re-start, patient has had 6 doses of warfarin.  Indication: VTE history, factor V leiden mutation Duration: indefinite  Anticoagulation Clinic Visit History: Patient does not report signs/symptoms of bleeding or thromboembolism   ASSESSMENT Recent Results: The most recent result is correlated with 52.5 mg per week: Lab Results  Component Value Date   INR 2.3 08/02/2016   INR 2.9 07/20/2016   INR 3.2 06/16/2016   PROTIME 27.6 (H) 07/30/2013    Anticoagulation Dosing: INR as of 08/02/2016 and Previous Dosing Information    INR Dt INR Goal [name] Sun Mon Tue Wed Thu Fri Sat   08/02/2016 2.3 2.0-3.0 52.5 mg 7.5 mg 7.5 mg 7.5 mg 7.5 mg 7.5 mg 7.5 mg 7.5 mg    Previous description   Dr. [name]   Anticoagulation Dose Instructions as of 08/02/2016      Total Sun Mon Tue Wed Thu Fri Sat   New Dose 52.5 mg 7.5 mg 7.5 mg 7.5 mg 7.5 mg 7.5 mg 7.5 mg 7.5 mg     (5 mg x 1.5)  (5 mg x 1.5)  (5 mg x 1.5)  (5 mg x 1.5)  (5 mg x 1.5)  (5 mg x 1.5)  (5 mg x 1.5)                         Description   Dr. [name]     INR today: Therapeutic  PLAN Weekly dose was unchanged, patient will be seen in clinic for follow up in 2 days to verify INR against home meter.  Patient Instructions  Patient educated about medication as defined in this encounter and verbalized understanding by repeating back instructions provided.   Patient advised to contact clinic or seek medical attention if signs/symptoms of bleeding or thromboembolism occur.  Patient verbalized understanding by repeating back information and was advised to contact me if further  medication-related questions arise. Patient was also provided an information handout.  Follow-up 2 days for face-to-face visit  Malory Spurr J

## 2016-08-02 NOTE — Telephone Encounter (Signed)
Reviewed and agree Thanks Dr G 

## 2016-08-04 ENCOUNTER — Ambulatory Visit (INDEPENDENT_AMBULATORY_CARE_PROVIDER_SITE_OTHER): Payer: Medicare Other | Admitting: Pharmacist

## 2016-08-04 DIAGNOSIS — Z7901 Long term (current) use of anticoagulants: Secondary | ICD-10-CM

## 2016-08-04 DIAGNOSIS — I82629 Acute embolism and thrombosis of deep veins of unspecified upper extremity: Secondary | ICD-10-CM | POA: Diagnosis not present

## 2016-08-04 LAB — POCT INR: INR: 3.2

## 2016-08-04 NOTE — Progress Notes (Signed)
Anticoagulation Management Jessica Reilly is a 55 y.o. female who reports to the clinic for monitoring of warfarin treatment.   Indication: DVThistory Duration: indefinite  Anticoagulation Clinic Visit History: Patient does not report signs/symptoms of bleeding or thromboembolism  Anticoagulation Episode Summary    Current INR goal:   2.0-3.0  TTR:   73.4 % (4.3 y)  Next INR check:   08/18/2016  INR from last check:   3.2! (08/04/2016)  Weekly max dose:     Target end date:   04/11/2015  INR check location:     Preferred lab:     Send INR reminders to:   [facility]     Comments:         Anticoagulation Care Providers    Provider Role Specialty Phone number   Annia Belt, MD Referring Oncology [phone]     ASSESSMENT Recent Results: The most recent result is correlated with 52.5 mg per week: Lab Results  Component Value Date   INR 3.2 08/04/2016   INR 2.3 08/02/2016   INR 2.9 07/20/2016   PROTIME 27.6 (H) 07/30/2013    Anticoagulation Dosing: INR as of 08/04/2016 and Previous Dosing Information    INR Dt INR Goal [name] Sun Mon Tue Wed Thu Fri Sat   08/04/2016 3.2 2.0-3.0 52.5 mg 7.5 mg 7.5 mg 7.5 mg 7.5 mg 7.5 mg 7.5 mg 7.5 mg    Previous description   Dr. [name]   Anticoagulation Dose Instructions as of 08/04/2016      Total Sun Mon Tue Wed Thu Fri Sat   New Dose 52.5 mg 7.5 mg 7.5 mg 7.5 mg 7.5 mg 7.5 mg 7.5 mg 7.5 mg     (5 mg x 1.5)  (5 mg x 1.5)  (5 mg x 1.5)  (5 mg x 1.5)  (5 mg x 1.5)  (5 mg x 1.5)  (5 mg x 1.5)                         Description   Dr. [name]     INR today: slightly supratherapeutic  PLAN Weekly dose was unchanged  Patient Instructions  Patient educated about medication as defined in this encounter and verbalized understanding by repeating back instructions provided.    Patient advised to contact clinic or seek medical attention if signs/symptoms of bleeding or thromboembolism  occur.  Patient verbalized understanding by repeating back information and was advised to contact me if further medication-related questions arise. Patient was also provided an information handout.  Follow-up 2 weeks  Keymiah Lyles J  15 minutes spent face-to-face with the patient during the encounter. 50% of time spent on education. 50% of time was spent on assessment and plan.

## 2016-08-04 NOTE — Patient Instructions (Signed)
Patient educated about medication as defined in this encounter and verbalized understanding by repeating back instructions provided.   

## 2016-08-07 ENCOUNTER — Encounter (HOSPITAL_COMMUNITY): Payer: Self-pay | Admitting: Psychiatry

## 2016-08-07 ENCOUNTER — Ambulatory Visit (INDEPENDENT_AMBULATORY_CARE_PROVIDER_SITE_OTHER): Payer: Medicare Other | Admitting: Psychiatry

## 2016-08-07 DIAGNOSIS — F3132 Bipolar disorder, current episode depressed, moderate: Secondary | ICD-10-CM

## 2016-08-07 NOTE — Progress Notes (Signed)
4Patient:                           Jessica Reilly    DOB:                               1961/06/26  MR Number:                  [id number]  Location:                        [place]:  [address] [place],  [place], [address]  Start:                               Monday 08/07/2016 11:05 AM End:                                Monday 08/07/2016  11:50 AM    Provider/Observer:                           Maurice Small, MSW, LCSW   Chief Complaint:                                   Chief Complaint  Patient presents with  . Depression    Reason For Service:                         Patient is a returning patient to this practice and has received medication management and outpatient therapy for treatment of Bipolar Disorder. She last was seen in 2014. She reports she discontinued services due to lack of insurance coverage. Per her report, she became eligible for Medicaid this past August. She is resuming services today due to continued symptoms of depression and anxiety. She reports constant worry about her health. She is a cancer survivor and has had a double mastectomy. She has been in remission many years but constantly fears cancer will return. She reports multiple health issues that have resulted from side effects from radiation. She also reports poor self-image due to double mastectomy and decreased functioning due to  chronic back, knee, and shouldr pain. Patient worries about a variety of issues and reports memory difficulty, poor concentration, restlessness, mood swings, irritability, and anger. She continues to experience anxiety being away from her home. She also experiences periods of depression staying in the bed, losing interest in activities, experiencing no pleasure. Patient has a history of self-injurious behaviors but denies engaging in any of these behaviors in recent months. She denies suicidal/homicidal ideations. She does report sometimes having disturbing  violent intrusive thoughts with no intent or plan to act upon them.  She denies any hallucinations.   Interventions Strategy:                    Supportive therapy,   Participation Level:                           Active  Participation Quality:  Monopolizing                            Behavioral Observation:                  Restless, pressured rapid speech, circumstantiality, anxious,   Current Psychosocial Factors:       Multiple stressors - multiple health issues, financial stress regarding mortgage, concerns about children/grandchildren, estranged relationship with daughter,  Content of Session:                           reviewed symptoms, assisted patient identify triggers of anxiety and anger, assisted patient identify realistic expectations regarding interaction with family praised and reinforced patient's use of controlled breathing, encouraged patient to remain medication compliant  Current Status:                                   excessive worry, memory difficulty, poor concentration, restlessness, mood swings, anger, irritability, depressed mood, decreased interest in activities, loss of pleasure,  Suicidal/Homicidal:   No.   Patient Progress:                              Poor. Patient reports no change in symptoms since last session. She continues to feel stressed and overwhelmed. However, she reports improved efforts regarding decreasing angry aggressive outbursts and calming self when experiencing anxiety. She cites two recent examples in which she used skills. She continues to worry about her health and expresses disappointment along with frustration that first injection in back last week has not alleviated her pain.  She expresses continued worry about finances and concerns about family. She complains family will not listen to her and do what she says regarding household issues and requests. Patient reports she is taking seroquel as instructed by Dr.  Modesta Messing but says it doesn't seem to be helping yet.     Plan:     Return in 2 weeks,    Target Goals:                                     1. Learn and implement calming skills to manage overall anxiety  Last Reviewed:                        Goals Addressed Today:                 1  Impression/Diagnosis:                     The patient has a long-standing history of symptoms of depression and anxiety. She has a previous diagnosis of bipolar disorder. Current symptoms include excessive worry, memory difficulty, poor concentration, restlessness, mood swings, anger, irritability, depressed mood, decreased interest in activities, and loss of pleasure. She also sometimes experiences disturbing intrusive thoughts.   Diagnosis:                  Axis I:  Bipolar disorder, rule out OCD and generalized anxiety disorder  Axis II: Deferred

## 2016-08-07 NOTE — Progress Notes (Signed)
Reviewed This makes more sense. Thanks DrG

## 2016-08-08 LAB — PROTIME-INR: INR: 1.3

## 2016-08-14 ENCOUNTER — Other Ambulatory Visit: Payer: Self-pay | Admitting: Oncology

## 2016-08-14 DIAGNOSIS — I82623 Acute embolism and thrombosis of deep veins of upper extremity, bilateral: Secondary | ICD-10-CM

## 2016-08-15 ENCOUNTER — Other Ambulatory Visit: Payer: Self-pay | Admitting: Pharmacist

## 2016-08-15 DIAGNOSIS — I82623 Acute embolism and thrombosis of deep veins of upper extremity, bilateral: Secondary | ICD-10-CM

## 2016-08-15 MED ORDER — WARFARIN SODIUM 5 MG PO TABS: 7.5000 mg | ORAL_TABLET | Freq: Every day | ORAL | 3 refills | Status: DC

## 2016-08-15 NOTE — Progress Notes (Signed)
Previous warfarin refill did not go through. Re-sent.

## 2016-08-24 ENCOUNTER — Ambulatory Visit (HOSPITAL_COMMUNITY): Payer: Self-pay | Admitting: Psychiatry

## 2016-08-25 ENCOUNTER — Ambulatory Visit (HOSPITAL_COMMUNITY): Payer: Self-pay | Admitting: Psychiatry

## 2016-08-28 ENCOUNTER — Telehealth (HOSPITAL_COMMUNITY): Payer: Self-pay | Admitting: *Deleted

## 2016-08-28 NOTE — Telephone Encounter (Signed)
patient canceled her appointment for 08/29/16.   said she will call back to schedule.
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
## 2016-08-29 ENCOUNTER — Ambulatory Visit (HOSPITAL_COMMUNITY): Payer: Self-pay | Admitting: Psychiatry

## 2016-09-18 ENCOUNTER — Telehealth (INDEPENDENT_AMBULATORY_CARE_PROVIDER_SITE_OTHER): Payer: Medicare Other | Admitting: Pharmacist

## 2016-09-18 DIAGNOSIS — D6851 Activated protein C resistance: Secondary | ICD-10-CM | POA: Diagnosis not present

## 2016-09-18 DIAGNOSIS — Z7901 Long term (current) use of anticoagulants: Secondary | ICD-10-CM

## 2016-09-18 LAB — POCT INR: INR: 2.3

## 2016-09-18 NOTE — Patient Instructions (Signed)
Patient educated about medication as defined in this encounter and verbalized understanding by repeating back instructions provided.   

## 2016-09-18 NOTE — Progress Notes (Signed)
Anticoagulation Management Jessica Reilly is a 55 y.o. female who reports to the clinic for monitoring of warfarin treatment.    Indication: factor V Leiden deficiency, DVT history Duration: indefinite  Anticoagulation Clinic Visit History: Patient does not report signs/symptoms of bleeding or thromboembolism  Anticoagulation Episode Summary    Current INR goal:   2.0-3.0  TTR:   73.0 % (4.4 y)  Next INR check:   09/25/2016  INR from last check:   2.3 (09/18/2016)  Weekly max dose:     Target end date:   04/11/2015  INR check location:     Preferred lab:     Send INR reminders to:   [facility]     Comments:         Anticoagulation Care Providers    Provider Role Specialty Phone number   Annia Belt, MD Referring Oncology [phone]     ASSESSMENT Recent Results: The most recent result is correlated with 52.5 mg per week: Lab Results  Component Value Date   INR 2.3 09/18/2016   INR 3.2 08/04/2016   INR 2.3 08/02/2016   PROTIME 27.6 (H) 07/30/2013    Anticoagulation Dosing: INR as of 09/18/2016 and Previous Dosing Information    INR Dt INR Goal [name] Sun Mon Tue Wed Thu Fri Sat   09/18/2016 2.3 2.0-3.0 52.5 mg 7.5 mg 7.5 mg 7.5 mg 7.5 mg 7.5 mg 7.5 mg 7.5 mg    Previous description   Dr. [name]   Anticoagulation Dose Instructions as of 09/18/2016      Total Sun Mon Tue Wed Thu Fri Sat   New Dose 52.5 mg 7.5 mg 7.5 mg 7.5 mg 7.5 mg 7.5 mg 7.5 mg 7.5 mg     (5 mg x 1.5)  (5 mg x 1.5)  (5 mg x 1.5)  (5 mg x 1.5)  (5 mg x 1.5)  (5 mg x 1.5)  (5 mg x 1.5)                         Description   Dr. [name]     INR today: Therapeutic  PLAN Weekly dose was unchanged  Patient Instructions  Patient educated about medication as defined in this encounter and verbalized understanding by repeating back instructions provided.   Patient advised to contact clinic or seek medical attention if signs/symptoms of bleeding or  thromboembolism occur.  Patient verbalized understanding by repeating back information and was advised to contact me if further medication-related questions arise. Patient was also provided an information handout.  Follow-up 1 week  Kim,Jennifer J

## 2016-09-18 NOTE — Telephone Encounter (Signed)
Reviewed Orpha Bur

## 2016-09-28 LAB — PROTIME-INR

## 2016-10-07 DIAGNOSIS — D6851 Activated protein C resistance: Secondary | ICD-10-CM | POA: Diagnosis not present

## 2016-10-10 ENCOUNTER — Telehealth (INDEPENDENT_AMBULATORY_CARE_PROVIDER_SITE_OTHER): Payer: Medicare Other | Admitting: Pharmacist

## 2016-10-10 DIAGNOSIS — E782 Mixed hyperlipidemia: Secondary | ICD-10-CM | POA: Diagnosis not present

## 2016-10-10 DIAGNOSIS — E538 Deficiency of other specified B group vitamins: Secondary | ICD-10-CM | POA: Diagnosis not present

## 2016-10-10 DIAGNOSIS — I82629 Acute embolism and thrombosis of deep veins of unspecified upper extremity: Secondary | ICD-10-CM

## 2016-10-10 DIAGNOSIS — I82623 Acute embolism and thrombosis of deep veins of upper extremity, bilateral: Secondary | ICD-10-CM | POA: Diagnosis not present

## 2016-10-10 DIAGNOSIS — I10 Essential (primary) hypertension: Secondary | ICD-10-CM | POA: Diagnosis not present

## 2016-10-10 DIAGNOSIS — Z1389 Encounter for screening for other disorder: Secondary | ICD-10-CM | POA: Diagnosis not present

## 2016-10-10 DIAGNOSIS — Z7901 Long term (current) use of anticoagulants: Secondary | ICD-10-CM | POA: Diagnosis not present

## 2016-10-10 DIAGNOSIS — R7309 Other abnormal glucose: Secondary | ICD-10-CM | POA: Diagnosis not present

## 2016-10-10 DIAGNOSIS — C50919 Malignant neoplasm of unspecified site of unspecified female breast: Secondary | ICD-10-CM | POA: Diagnosis not present

## 2016-10-10 DIAGNOSIS — C50912 Malignant neoplasm of unspecified site of left female breast: Secondary | ICD-10-CM | POA: Diagnosis not present

## 2016-10-10 DIAGNOSIS — D6851 Activated protein C resistance: Secondary | ICD-10-CM | POA: Diagnosis not present

## 2016-10-11 DIAGNOSIS — M1990 Unspecified osteoarthritis, unspecified site: Secondary | ICD-10-CM | POA: Diagnosis not present

## 2016-10-12 ENCOUNTER — Telehealth (HOSPITAL_COMMUNITY): Payer: Self-pay

## 2016-10-12 LAB — POCT INR: INR: 2

## 2016-10-12 NOTE — Telephone Encounter (Signed)
Calling patient to follow up on INR results. Unable to reach

## 2016-10-12 NOTE — Telephone Encounter (Signed)
Reviewed Thanks DrG 

## 2016-10-12 NOTE — Telephone Encounter (Signed)
Patient was contacted with Richard Miu, PharmD candidate. I agree with the assessment and plan of care documented.

## 2016-10-12 NOTE — Progress Notes (Signed)
Anticoagulation Management Jessica Reilly is a 56 y.o. female contacted clinic for monitoring of warfarin treatment.    Anticoagulation Clinic Visit History: Patient does not report signs/symptoms of bleeding or thromboembolism No recent changes Anticoagulation Episode Summary    Current INR goal:   2.0-3.0  TTR:   73.0 % (4.4 y)  Next INR check:   09/25/2016  INR from last check:   2.3 (09/18/2016)  Weekly max dose:     Target end date:   04/11/2015  INR check location:     Preferred lab:     Send INR reminders to:   [facility]     Comments:         Anticoagulation Care Providers    Provider Role Specialty Phone number   Annia Belt, MD Referring Oncology [phone]     ASSESSMENT Recent Results: The most recent result is correlated with 52.5 mg per week: Lab Results  Component Value Date   INR 2.3 09/18/2016   INR 3.2 08/04/2016   INR 2.3 08/02/2016   PROTIME 27.6 (H) 07/30/2013    Anticoagulation Dosing: INR as of 09/18/2016 and Previous Dosing Information    INR Dt INR Goal [name] Sun Mon Tue Wed Thu Fri Sat   09/18/2016 2.3 2.0-3.0 52.5 mg 7.5 mg 7.5 mg 7.5 mg 7.5 mg 7.5 mg 7.5 mg 7.5 mg    Previous description   Dr. [name]   Anticoagulation Dose Instructions as of 09/18/2016      Total Sun Mon Tue Wed Thu Fri Sat   New Dose 52.5 mg 7.5 mg 7.5 mg 7.5 mg 7.5 mg 7.5 mg 7.5 mg 7.5 mg     (5 mg x 1.5)  (5 mg x 1.5)  (5 mg x 1.5)  (5 mg x 1.5)  (5 mg x 1.5)  (5 mg x 1.5)  (5 mg x 1.5)                         Description   Dr. [name]     INR today: 2.0  PLAN Weekly dose to remain the same.  Instructed patient to check home INR on Monday (10/16/16) and report level to clinic.  Patient advised to contact clinic or seek medical attention if signs/symptoms of bleeding or thromboembolism occur.  Patient verbalized understanding by repeating back information and was advised to contact me if further medication-related questions  arise. Patient was also provided an information handout.  Follow-up  Jhonnie Garner

## 2016-10-19 LAB — PROTIME-INR: INR: 2 — AB (ref 0.9–1.1)

## 2016-10-20 ENCOUNTER — Telehealth (INDEPENDENT_AMBULATORY_CARE_PROVIDER_SITE_OTHER): Payer: Medicare Other | Admitting: Pharmacist

## 2016-10-20 DIAGNOSIS — Z86718 Personal history of other venous thrombosis and embolism: Secondary | ICD-10-CM | POA: Diagnosis not present

## 2016-10-20 DIAGNOSIS — D6851 Activated protein C resistance: Secondary | ICD-10-CM

## 2016-10-20 DIAGNOSIS — Z7901 Long term (current) use of anticoagulants: Secondary | ICD-10-CM

## 2016-10-20 DIAGNOSIS — I82629 Acute embolism and thrombosis of deep veins of unspecified upper extremity: Secondary | ICD-10-CM

## 2016-10-20 LAB — POCT INR: INR: 1.8

## 2016-10-20 NOTE — Progress Notes (Signed)
Anticoagulation Management Jessica Reilly is a 56 y.o. female who reports to the clinic for monitoring of warfarin treatment.    Indication: factor V Leiden deficiency, DVT history Duration: indefinite Supervising physician: Murriel Hopper  Anticoagulation Clinic Visit History: Patient does not report signs/symptoms of bleeding or thromboembolism  Anticoagulation Episode Summary    Current INR goal:   2.0-3.0  TTR:   73.1 % (4.5 y)  Next INR check:   10/26/2016  INR from last check:   1.8! (10/20/2016)  Weekly max dose:     Target end date:   04/11/2015  INR check location:     Preferred lab:     Send INR reminders to:   [facility]     Comments:         Anticoagulation Care Providers    Provider Role Specialty Phone number   Annia Belt, MD Referring Oncology [phone]     ASSESSMENT Recent Results: The most recent result is correlated with 42.5 mg per week: Lab Results  Component Value Date   INR 1.8 10/20/2016   INR 2.0 10/12/2016   INR 2.0 (A) 10/10/2016   PROTIME 27.6 (H) 07/30/2013   Anticoagulation Dosing: INR as of 10/20/2016 and Previous Dosing Information    INR Dt INR Goal Wkly Tot Sun Mon Tue Wed Thu Fri Sat   10/20/2016 1.8 2.0-3.0 42.5 mg 7.5 mg 5 mg 7.5 mg 5 mg 5 mg 7.5 mg 5 mg   Patient deviated from recommended dosing.       Previous description   Dr. [name]   Anticoagulation Dose Instructions as of 10/20/2016      Total Sun Mon Tue Wed Thu Fri Sat   New Dose 45 mg 7.5 mg 5 mg 7.5 mg 5 mg 5 mg 7.5 mg 7.5 mg     (5 mg x 1.5)  (5 mg x 1)  (5 mg x 1.5)  (5 mg x 1)  (5 mg x 1)  (5 mg x 1.5)  (5 mg x 1.5)                         Description   Dr. [name]     INR today: Subtherapeutic  PLAN Weekly dose was increased by 6% to 45 mg per week  Patient Instructions  Patient educated about medication as defined in this encounter and verbalized understanding by repeating back instructions provided.   Patient  advised to contact clinic or seek medical attention if signs/symptoms of bleeding or thromboembolism occur.  Patient verbalized understanding by repeating back information and was advised to contact me if further medication-related questions arise. Patient was also provided an information handout.  Follow-up Return in about 1 week (around 10/27/2016).  Jessica Reilly,Jennifer J

## 2016-10-20 NOTE — Patient Instructions (Signed)
Patient educated about medication as defined in this encounter and verbalized understanding by repeating back instructions provided.   

## 2016-10-31 DIAGNOSIS — L400 Psoriasis vulgaris: Secondary | ICD-10-CM | POA: Diagnosis not present

## 2016-10-31 DIAGNOSIS — D1801 Hemangioma of skin and subcutaneous tissue: Secondary | ICD-10-CM | POA: Diagnosis not present

## 2016-10-31 DIAGNOSIS — L720 Epidermal cyst: Secondary | ICD-10-CM | POA: Diagnosis not present

## 2016-11-02 ENCOUNTER — Encounter: Payer: Self-pay | Admitting: *Deleted

## 2016-11-02 LAB — PROTIME-INR

## 2016-11-06 DIAGNOSIS — Z780 Asymptomatic menopausal state: Secondary | ICD-10-CM | POA: Diagnosis not present

## 2016-11-06 DIAGNOSIS — Z79811 Long term (current) use of aromatase inhibitors: Secondary | ICD-10-CM | POA: Diagnosis not present

## 2016-11-06 DIAGNOSIS — Z9013 Acquired absence of bilateral breasts and nipples: Secondary | ICD-10-CM | POA: Diagnosis not present

## 2016-11-06 DIAGNOSIS — I2510 Atherosclerotic heart disease of native coronary artery without angina pectoris: Secondary | ICD-10-CM | POA: Diagnosis not present

## 2016-11-06 DIAGNOSIS — C50111 Malignant neoplasm of central portion of right female breast: Secondary | ICD-10-CM | POA: Diagnosis not present

## 2016-11-06 DIAGNOSIS — Z23 Encounter for immunization: Secondary | ICD-10-CM | POA: Diagnosis not present

## 2016-11-13 DIAGNOSIS — E538 Deficiency of other specified B group vitamins: Secondary | ICD-10-CM | POA: Diagnosis not present

## 2016-11-14 ENCOUNTER — Telehealth (INDEPENDENT_AMBULATORY_CARE_PROVIDER_SITE_OTHER): Payer: Medicare Other | Admitting: Pharmacist

## 2016-11-14 DIAGNOSIS — Z7901 Long term (current) use of anticoagulants: Secondary | ICD-10-CM

## 2016-11-14 DIAGNOSIS — Z862 Personal history of diseases of the blood and blood-forming organs and certain disorders involving the immune mechanism: Secondary | ICD-10-CM | POA: Diagnosis not present

## 2016-11-14 DIAGNOSIS — Z86718 Personal history of other venous thrombosis and embolism: Secondary | ICD-10-CM | POA: Diagnosis not present

## 2016-11-14 DIAGNOSIS — I82623 Acute embolism and thrombosis of deep veins of upper extremity, bilateral: Secondary | ICD-10-CM

## 2016-11-14 LAB — POCT INR: INR: 2.8

## 2016-11-14 MED ORDER — WARFARIN SODIUM 5 MG PO TABS: ORAL_TABLET | ORAL | 3 refills | Status: DC

## 2016-11-14 NOTE — Patient Instructions (Signed)
Patient educated about medication as defined in this encounter and verbalized understanding by repeating back instructions provided.   

## 2016-11-14 NOTE — Telephone Encounter (Signed)
Reviewed DrG 

## 2016-11-14 NOTE — Progress Notes (Signed)
Anticoagulation Management Jessica Reilly is a 56 y.o. female who reports to the clinic for monitoring of warfarin treatment.    Indication: history of DVT and factor V Leiden deficiency  Duration: indefinite Supervising physician: Murriel Hopper  Anticoagulation Clinic Visit History: Patient does not report signs/symptoms of bleeding or thromboembolism   Anticoagulation Episode Summary    Current INR goal:   2.0-3.0  TTR:   73.2 % (4.6 y)  Next INR check:   12/12/2016  INR from last check:     Weekly max dose:     Target end date:   04/11/2015  INR check location:     Preferred lab:     Send INR reminders to:   [facility]     Comments:         Anticoagulation Care Providers    Provider Role Specialty Phone number   Annia Belt, MD Referring Oncology [phone]     ASSESSMENT Recent Results: The most recent result is correlated with 45 mg per week: Lab Results  Component Value Date   INR 2.8 11/14/2016   INR 1.8 10/20/2016   INR 2.0 10/12/2016   PROTIME 27.6 (H) 07/30/2013   Anticoagulation Dosing: INR as of 11/14/2016 and Previous Dosing Information    INR Dt INR Goal Wkly Tot Sun Mon Tue Wed Thu Fri Sat     2.0-3.0 45 mg 7.5 mg 5 mg 7.5 mg 5 mg 5 mg 7.5 mg 7.5 mg    Previous description   Dr. [name]   Anticoagulation Dose Instructions as of 11/14/2016      Total Sun Mon Tue Wed Thu Fri Sat   New Dose 45 mg 7.5 mg 5 mg 7.5 mg 5 mg 5 mg 7.5 mg 7.5 mg     (5 mg x 1.5)  (5 mg x 1)  (5 mg x 1.5)  (5 mg x 1)  (5 mg x 1)  (5 mg x 1.5)  (5 mg x 1.5)                         Description   Dr. [name]     INR today: Therapeutic  PLAN Weekly dose was unchanged   Patient Instructions  Patient educated about medication as defined in this encounter and verbalized understanding by repeating back instructions provided.   Patient advised to contact clinic or seek medical attention if signs/symptoms of bleeding or thromboembolism  occur.  Patient verbalized understanding by repeating back information and was advised to contact me if further medication-related questions arise. Patient was also provided an information handout.  Follow-up 4 weeks  [name]

## 2016-11-23 ENCOUNTER — Telehealth (HOSPITAL_COMMUNITY): Payer: Self-pay | Admitting: *Deleted

## 2016-11-23 NOTE — Telephone Encounter (Signed)
RETURNED PHONE CALL TO PATIENT.

## 2016-11-24 DIAGNOSIS — G8929 Other chronic pain: Secondary | ICD-10-CM | POA: Diagnosis not present

## 2016-11-24 DIAGNOSIS — R079 Chest pain, unspecified: Secondary | ICD-10-CM | POA: Diagnosis not present

## 2016-11-24 DIAGNOSIS — M25511 Pain in right shoulder: Secondary | ICD-10-CM | POA: Diagnosis not present

## 2016-11-24 DIAGNOSIS — M25512 Pain in left shoulder: Secondary | ICD-10-CM | POA: Diagnosis not present

## 2016-11-24 DIAGNOSIS — M75121 Complete rotator cuff tear or rupture of right shoulder, not specified as traumatic: Secondary | ICD-10-CM | POA: Diagnosis not present

## 2016-11-24 DIAGNOSIS — I350 Nonrheumatic aortic (valve) stenosis: Secondary | ICD-10-CM | POA: Diagnosis not present

## 2016-11-24 DIAGNOSIS — M75101 Unspecified rotator cuff tear or rupture of right shoulder, not specified as traumatic: Secondary | ICD-10-CM | POA: Diagnosis not present

## 2016-11-24 DIAGNOSIS — M87021 Idiopathic aseptic necrosis of right humerus: Secondary | ICD-10-CM | POA: Diagnosis not present

## 2016-11-24 DIAGNOSIS — M879 Osteonecrosis, unspecified: Secondary | ICD-10-CM | POA: Diagnosis not present
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
## 2016-11-30 ENCOUNTER — Encounter (HOSPITAL_COMMUNITY): Payer: Self-pay | Admitting: Psychiatry

## 2016-11-30 ENCOUNTER — Ambulatory Visit (INDEPENDENT_AMBULATORY_CARE_PROVIDER_SITE_OTHER): Payer: Medicare Other | Admitting: Psychiatry

## 2016-11-30 VITALS — BP 128/94 | HR 62 | Ht 61.5 in | Wt 204.6 lb

## 2016-11-30 DIAGNOSIS — Z79899 Other long term (current) drug therapy: Secondary | ICD-10-CM | POA: Diagnosis not present

## 2016-11-30 DIAGNOSIS — Z833 Family history of diabetes mellitus: Secondary | ICD-10-CM

## 2016-11-30 DIAGNOSIS — Z818 Family history of other mental and behavioral disorders: Secondary | ICD-10-CM | POA: Diagnosis not present

## 2016-11-30 DIAGNOSIS — Z8249 Family history of ischemic heart disease and other diseases of the circulatory system: Secondary | ICD-10-CM

## 2016-11-30 DIAGNOSIS — Z853 Personal history of malignant neoplasm of breast: Secondary | ICD-10-CM

## 2016-11-30 DIAGNOSIS — Z810 Family history of intellectual disabilities: Secondary | ICD-10-CM

## 2016-11-30 DIAGNOSIS — F3132 Bipolar disorder, current episode depressed, moderate: Secondary | ICD-10-CM

## 2016-11-30 DIAGNOSIS — Z800 Family history of malignant neoplasm of digestive organs: Secondary | ICD-10-CM

## 2016-11-30 DIAGNOSIS — Z811 Family history of alcohol abuse and dependence: Secondary | ICD-10-CM

## 2016-11-30 DIAGNOSIS — Z9889 Other specified postprocedural states: Secondary | ICD-10-CM

## 2016-11-30 DIAGNOSIS — Z9013 Acquired absence of bilateral breasts and nipples: Secondary | ICD-10-CM | POA: Diagnosis not present

## 2016-11-30 DIAGNOSIS — Z888 Allergy status to other drugs, medicaments and biological substances status: Secondary | ICD-10-CM

## 2016-11-30 MED ORDER — OLANZAPINE 2.5 MG PO TABS: 2.5000 mg | ORAL_TABLET | Freq: Every day | ORAL | 0 refills | Status: DC

## 2016-11-30 NOTE — Patient Instructions (Signed)
1. Discontinue quetiapine 2. Start olanzapine 2.5 mg at night 3. Return to clinic in one month

## 2016-12-01 LAB — PROTIME-INR

## 2016-12-04 DIAGNOSIS — R079 Chest pain, unspecified: Secondary | ICD-10-CM | POA: Diagnosis not present

## 2016-12-08 ENCOUNTER — Telehealth: Payer: Self-pay | Admitting: Pharmacist

## 2016-12-08 ENCOUNTER — Other Ambulatory Visit: Payer: Self-pay | Admitting: Pharmacist

## 2016-12-08 DIAGNOSIS — Z7901 Long term (current) use of anticoagulants: Secondary | ICD-10-CM

## 2016-12-08 DIAGNOSIS — I82623 Acute embolism and thrombosis of deep veins of upper extremity, bilateral: Secondary | ICD-10-CM

## 2016-12-08 NOTE — Telephone Encounter (Signed)
Anti-Coagulation Progress Note  Jessica Reilly is a 56 y.o. female who is currently on an anti-coagulation regimen for Factor V Leiden deficiency and history of a DVT.   RECENT RESULTS: The most recent result of INR of 1.4  is correlated with a dose of 45  mg. per week.  ANTI-COAG DOSE: Updated to 50 mg per week. 7.5 mg every day except Wednesday. 5 mg on Wednesday.    FOLLOW-UP Patient reports missing 1 dose over the past week and then taking an extra half tablet the next day. Will increase dose conservatively to 50 mg a week (a 10% increase).  - Patient to follow up with INR in 1 week (3/8 or 3/9)   Uvaldo Bristle, PharmD PGY1 Pharmacy Resident

## 2016-12-08 NOTE — Telephone Encounter (Signed)
Reviewed Thanks DrG 

## 2016-12-15 LAB — PROTIME-INR: INR: 1.4 — AB (ref 0.9–1.1)

## 2016-12-19 ENCOUNTER — Ambulatory Visit (HOSPITAL_COMMUNITY): Payer: Self-pay | Admitting: Psychiatry

## 2016-12-19 DIAGNOSIS — D6851 Activated protein C resistance: Secondary | ICD-10-CM | POA: Diagnosis not present

## 2017-01-01 ENCOUNTER — Telehealth (INDEPENDENT_AMBULATORY_CARE_PROVIDER_SITE_OTHER): Payer: Medicare Other | Admitting: Pharmacist

## 2017-01-01 DIAGNOSIS — Z7901 Long term (current) use of anticoagulants: Secondary | ICD-10-CM

## 2017-01-01 DIAGNOSIS — D689 Coagulation defect, unspecified: Secondary | ICD-10-CM | POA: Diagnosis not present

## 2017-01-01 DIAGNOSIS — I82622 Acute embolism and thrombosis of deep veins of left upper extremity: Secondary | ICD-10-CM

## 2017-01-01 DIAGNOSIS — D6851 Activated protein C resistance: Secondary | ICD-10-CM

## 2017-01-01 LAB — POCT INR: INR: 2.1

## 2017-01-01 NOTE — Telephone Encounter (Signed)
Reviewed Thanks DrG 

## 2017-01-01 NOTE — Progress Notes (Signed)
Anticoagulation Management Jessica Reilly is a 56 y.o. female who called for warfarin management.    Indication: factor V Leiden deficiency, history of DVT Duration: indefinite Supervising physician: Murriel Hopper  Anticoagulation Clinic Visit History: Patient does not report signs/symptoms of bleeding or thromboembolism  Anticoagulation Episode Summary    Current INR goal:   2.0-3.0  TTR:   72.1 % (4.7 y)  Next INR check:   01/08/2017  INR from last check:   2.1 (01/01/2017)  Weekly max dose:     Target end date:   04/11/2015  INR check location:     Preferred lab:     Send INR reminders to:   RX [facility]   Indications   DVT of upper extremity (deep vein thrombosis) ([place]) [I82.629] Factor V Leiden mutation ([place]) [D68.51] Coagulopathy ([place]) [D68.9] Chronic anticoagulation [Z79.01]       Comments:         Anticoagulation Care Providers    Provider Role Specialty Phone number   Annia Belt, MD Referring Oncology [phone]     ASSESSMENT Recent Results: The most recent result is correlated with 45 mg per week: Lab Results  Component Value Date   INR 2.1 01/01/2017   INR 1.4 (A) 12/08/2016   INR 2.8 11/14/2016   PROTIME 27.6 (H) 07/30/2013   Anticoagulation Dosing: INR as of 01/01/2017 and Previous Dosing Information    INR Dt INR Goal [name] Sun Mon Tue Wed Thu Fri Sat   01/01/2017 2.1 2.0-3.0 45 mg 7.5 mg 5 mg 7.5 mg 5 mg 5 mg 7.5 mg 7.5 mg    Previous description   Dr. [name]   Anticoagulation Dose Instructions as of 01/01/2017      Total Sun Mon Tue Wed Thu Fri Sat   New Dose 45 mg 7.5 mg 5 mg 7.5 mg 5 mg 5 mg 7.5 mg 7.5 mg     (5 mg x 1.5)  (5 mg x 1)  (5 mg x 1.5)  (5 mg x 1)  (5 mg x 1)  (5 mg x 1.5)  (5 mg x 1.5)                         Description   Dr. [name]     INR today: Therapeutic  PLAN Weekly dose was unchanged  Patient Instructions  Patient educated about medication as defined in this encounter and  verbalized understanding by repeating back instructions provided.   Patient advised to contact clinic or seek medical attention if signs/symptoms of bleeding or thromboembolism occur.  Patient verbalized understanding by repeating back information and was advised to contact me if further medication-related questions arise. Patient was also provided an information handout.  Follow-up Return in about 1 week (around 01/08/2017).  [name]

## 2017-01-01 NOTE — Patient Instructions (Signed)
Patient educated about medication as defined in this encounter and verbalized understanding by repeating back instructions provided.   

## 2017-01-16 ENCOUNTER — Ambulatory Visit (INDEPENDENT_AMBULATORY_CARE_PROVIDER_SITE_OTHER): Payer: Medicare Other | Admitting: Psychiatry

## 2017-01-16 ENCOUNTER — Other Ambulatory Visit: Payer: Medicare Other | Admitting: Adult Health

## 2017-01-16 ENCOUNTER — Encounter (HOSPITAL_COMMUNITY): Payer: Self-pay | Admitting: Psychiatry

## 2017-01-16 DIAGNOSIS — F3132 Bipolar disorder, current episode depressed, moderate: Secondary | ICD-10-CM
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
## 2017-01-16 NOTE — Progress Notes (Signed)
4Patient:                           Jessica Reilly    DOB:                               01/24/1961  MR Number:                  [id number]  Location:                        [place]:  [address] [place],  [place], [address]  Start:                               Tuesday 01/16/2017 10:14 AM  End:                                Tuesday 01/16/2017 11:00 AM   Provider/Observer:                           Maurice Small, MSW, LCSW   Chief Complaint:                                   Chief Complaint  Patient presents with  . Depression    Reason For Service:                         Patient is a returning patient to this practice and has received medication management and outpatient therapy for treatment of Bipolar Disorder. She last was seen in 2014. She reports she discontinued services due to lack of insurance coverage. Per her report, she became eligible for Medicaid this past August. She is resuming services today due to continued symptoms of depression and anxiety. She reports constant worry about her health. She is a cancer survivor and has had a double mastectomy. She has been in remission many years but constantly fears cancer will return. She reports multiple health issues that have resulted from side effects from radiation. She also reports poor self-image due to double mastectomy and decreased functioning due to  chronic back, knee, and shouldr pain. Patient worries about a variety of issues and reports memory difficulty, poor concentration, restlessness, mood swings, irritability, and anger. She continues to experience anxiety being away from her home. She also experiences periods of depression staying in the bed, losing interest in activities, experiencing no pleasure. Patient has a history of self-injurious behaviors but denies engaging in any of these behaviors in recent months. She denies suicidal/homicidal ideations. She does report sometimes having disturbing  violent intrusive thoughts with no intent or plan to act upon them.  She denies any hallucinations.   Interventions Strategy:                    Supportive therapy,   Participation Level:                           Active  Participation Quality:  Monopolizing                            Behavioral Observation:                  Restless, pressured rapid speech, circumstantiality, anxious,   Current Psychosocial Factors:       Multiple stressors - multiple health issues, financial stress regarding mortgage, concerns about children/grandchildren, estranged relationship with daughter,  Content of Session:                           reviewed symptoms, assisted patient identify triggers of anxiety and anger, discussed importance of treatment compliance with medication, discussed emotion regulation and assisted patient identify effects of her emotion regulation difficulties on her life, discussed rationale for and practice mindful breathing technique   Current Status:                                   excessive worry, memory difficulty, poor concentration, restlessness, mood swings, anger, irritability, depressed mood, decreased interest in activities, loss of pleasure,  Suicidal/Homicidal:   No.   Patient Progress:                              Poor. Patient last was seen in October 2017 and reports no change in symptoms since last session. She continues to feel stressed and overwhelmed. She reports continued anxiety, depressed mood, and feelings of helplessness/hopelessness, poor motivation, fatigue. She expresses frustration regarding continued pain. She reports continued difficulty managing her emotions and states trying to stay at home in her room alone to avoid having anger outburst. Patient agrees to schedule appointment with Dr. Modesta Messing.   Plan:     Return in 2 weeks,    Target Goals:                                     1. Learn and implement calming skills to manage  overall anxiety  Last Reviewed:                        Goals Addressed Today:                 1  Impression/Diagnosis:                     The patient has a long-standing history of symptoms of depression and anxiety. She has a previous diagnosis of bipolar disorder. Current symptoms include excessive worry, memory difficulty, poor concentration, restlessness, mood swings, anger, irritability, depressed mood, decreased interest in activities, and loss of pleasure. She also sometimes experiences disturbing intrusive thoughts.   Diagnosis:                  Axis I:  Bipolar disorder, rule out OCD and generalized anxiety disorder  Axis II: Deferred

## 2017-01-23 ENCOUNTER — Ambulatory Visit (HOSPITAL_COMMUNITY): Payer: Self-pay | Admitting: Psychiatry

## 2017-01-24 DIAGNOSIS — E538 Deficiency of other specified B group vitamins: Secondary | ICD-10-CM | POA: Diagnosis not present

## 2017-02-07 ENCOUNTER — Ambulatory Visit (HOSPITAL_COMMUNITY): Payer: Self-pay | Admitting: Psychiatry

## 2017-02-08 DIAGNOSIS — B349 Viral infection, unspecified: Secondary | ICD-10-CM | POA: Diagnosis not present

## 2017-02-08 DIAGNOSIS — J329 Chronic sinusitis, unspecified: Secondary | ICD-10-CM | POA: Diagnosis not present

## 2017-02-08 DIAGNOSIS — I10 Essential (primary) hypertension: Secondary | ICD-10-CM | POA: Diagnosis not present

## 2017-02-09 LAB — PROTIME-INR: INR: 3.1

## 2017-02-13 ENCOUNTER — Telehealth (INDEPENDENT_AMBULATORY_CARE_PROVIDER_SITE_OTHER): Payer: Medicare Other | Admitting: Pharmacist

## 2017-02-13 DIAGNOSIS — D689 Coagulation defect, unspecified: Secondary | ICD-10-CM | POA: Diagnosis not present

## 2017-02-13 DIAGNOSIS — D6851 Activated protein C resistance: Secondary | ICD-10-CM | POA: Diagnosis not present

## 2017-02-13 DIAGNOSIS — Z7901 Long term (current) use of anticoagulants: Secondary | ICD-10-CM | POA: Diagnosis not present

## 2017-02-13 DIAGNOSIS — I82623 Acute embolism and thrombosis of deep veins of upper extremity, bilateral: Secondary | ICD-10-CM

## 2017-02-13 DIAGNOSIS — I82622 Acute embolism and thrombosis of deep veins of left upper extremity: Secondary | ICD-10-CM | POA: Diagnosis not present

## 2017-02-13 LAB — POCT INR: INR: 3

## 2017-02-13 NOTE — Patient Instructions (Signed)
Patient educated about medication as defined in this encounter and verbalized understanding by repeating back instructions provided.   

## 2017-02-13 NOTE — Progress Notes (Addendum)
Anticoagulation Management Jessica Reilly is a 56 y.o. female who reports to the clinic for monitoring of warfarin treatment.    Indication: DVT and factor V Leiden deficiency Duration: indefinite Supervising physician: Murriel Hopper  Anticoagulation Clinic Visit History: Patient does not report signs/symptoms of bleeding or thromboembolism. Patient states she has sinusitis and was given a prescription for prednisone but has not started taking due to concern for interaction with warfarin.  Anticoagulation Episode Summary    Current INR goal:   2.0-3.0  TTR:   71.6 % (4.8 y)  Next INR check:   02/20/2017  INR from last check:   3.0 (02/13/2017)  Weekly max warfarin dose:     Target end date:   04/11/2015  INR check location:     Preferred lab:     Send INR reminders to:   [place]   Indications   DVT of upper extremity (deep vein thrombosis) ([facility]) [I82.629] Factor V Leiden mutation ([place]) [D68.51] Coagulopathy ([place]) [D68.9] Chronic anticoagulation [Z79.01]       Comments:         Anticoagulation Care Providers    Provider Role Specialty Phone number   Annia Belt, MD Referring Oncology [phone]     ASSESSMENT Recent Results: The most recent result is correlated with 45 mg per week: Lab Results  Component Value Date   INR 3.0 02/13/2017   INR 3.1 01/25/2017   INR 2.1 01/01/2017   PROTIME 27.6 (H) 07/30/2013   Anticoagulation Dosing: INR as of 02/13/2017 and Previous Warfarin Dosing Information    INR Dt INR Goal Wkly Tot Sun Mon Tue Wed Thu Fri Sat   02/13/2017 3.0 2.0-3.0 50 mg 7.5 mg 7.5 mg 7.5 mg 5 mg 7.5 mg 7.5 mg 7.5 mg   Patient deviated from recommended dosing.       Previous description   DR [name] (route warfarin notes and list as authorizing provider for LOS & Follow-up, lab orders and warfarin prescriptions), do not list attending physician    Anticoagulation Warfarin Dose Instructions as of 02/13/2017      Total Sun  Mon Tue Wed Thu Fri Sat   New Dose 48.75 mg 7.5 mg 7.5 mg 7.5 mg 3.75 mg 7.5 mg 7.5 mg 7.5 mg     (7.5 mg x 1)  (7.5 mg x 1)  (7.5 mg x 1)  (7.5 mg x 0.5)  (7.5 mg x 1)  (7.5 mg x 1)  (7.5 mg x 1)                         Description   DR [id number] PATIENT (route warfarin notes and list as authorizing provider for LOS & Follow-up, lab orders and warfarin prescriptions), do not list attending physician      INR today: Therapeutic  PLAN Weekly dose was unchanged. Advised patient to check INR if she does take prednisone and contact clinic with results.  Patient Instructions  Patient educated about medication as defined in this encounter and verbalized understanding by repeating back instructions provided.   Patient advised to contact clinic or seek medical attention if signs/symptoms of bleeding or thromboembolism occur.  Patient verbalized understanding by repeating back information and was advised to contact me if further medication-related questions arise. Patient was also provided an information handout.  Follow-up Return in about 1 week (around 02/20/2017).  [name]

## 2017-02-14 MED ORDER — WARFARIN SODIUM 7.5 MG PO TABS: 7.5000 mg | ORAL_TABLET | Freq: Every day | ORAL | 2 refills | Status: DC

## 2017-02-14 NOTE — Addendum Note (Signed)
Addended by: Forde Dandy on: 02/14/2017 03:27 PM   Modules accepted: Orders

## 2017-02-19 NOTE — Telephone Encounter (Signed)
Reviewed Thanks DrG 

## 2017-02-20 ENCOUNTER — Telehealth: Payer: Self-pay

## 2017-02-20 LAB — POCT INR: INR: 3.6

## 2017-02-20 NOTE — Addendum Note (Signed)
Addended by: Forde Dandy on: 02/20/2017 03:55 PM   Modules accepted: Orders

## 2017-02-20 NOTE — Telephone Encounter (Addendum)
Anticoagulation Management Jessica Reilly is a 56 y.o. female who reports to the clinic for monitoring of warfarin treatment.    Indication: DVT of upper extremity, Factor V Leiden mutation Duration: indefinite Supervising physician: Murriel Hopper  Anticoagulation Clinic Visit History:  Patient does not report signs/symptoms of bleeding or thromboembolism. No changes in mediation.   ASSESSMENT Recent Results: The most recent result is correlated with 48.75 mg per week: Lab Results  Component Value Date   INR 3.6 02/20/2017   INR 3.0 02/13/2017   INR 3.1 01/25/2017   PROTIME 27.6 (H) 07/30/2013    Anticoagulation Dosing: INR as of 02/13/2017 and Previous Warfarin Dosing Information    INR Dt INR Goal Wkly Tot Sun Mon Tue Wed Thu Fri Sat   02/13/2017 3.0 2.0-3.0 50 mg 7.5 mg 7.5 mg 7.5 mg 5 mg 7.5 mg 7.5 mg 7.5 mg   Patient deviated from recommended dosing.       Previous description   DR [name] (route warfarin notes and list as authorizing provider for LOS & Follow-up, lab orders and warfarin prescriptions), do not list attending physician    Anticoagulation Warfarin Dose Instructions as of 02/13/2017      Total Sun Mon Tue Wed Thu Fri Sat   New Dose 48.75 mg 7.5 mg 7.5 mg 7.5 mg 3.75 mg 7.5 mg 7.5 mg 7.5 mg     (7.5 mg x 1)  (7.5 mg x 1)  (7.5 mg x 1)  (7.5 mg x 0.5)  (7.5 mg x 1)  (7.5 mg x 1)  (7.5 mg x 1)                         Description   DR [id number] PATIENT (route warfarin notes and list as authorizing provider for LOS & Follow-up, lab orders and warfarin prescriptions), do not list attending physician      INR today: Supratherapeutic  PLAN Weekly dose was decreased by 8% to 45 mg per week  Patient was instructed to take one-half tablet of her 7.5mg  tablets Tuesday and Thursday, and one tablet all other days.  Patient advised to contact clinic or seek medical attention if signs/symptoms of bleeding or thromboembolism  occur.  Patient verbalized understanding by repeating back information and was advised to contact me if further medication-related questions arise. Patient was also provided an information handout.  Follow-up Follow-up INR in one week.   Jessica Reilly

## 2017-02-20 NOTE — Telephone Encounter (Signed)
Patient was contacted with Karmen Stabs, PharmD candidate. I agree with the assessment and plan of care documented.

## 2017-03-07 ENCOUNTER — Encounter: Payer: Self-pay | Admitting: Pharmacist

## 2017-03-07 DIAGNOSIS — Z7901 Long term (current) use of anticoagulants: Secondary | ICD-10-CM

## 2017-03-07 DIAGNOSIS — I82622 Acute embolism and thrombosis of deep veins of left upper extremity: Secondary | ICD-10-CM

## 2017-03-07 DIAGNOSIS — D6851 Activated protein C resistance: Secondary | ICD-10-CM

## 2017-03-07 DIAGNOSIS — D689 Coagulation defect, unspecified: Secondary | ICD-10-CM

## 2017-03-07 LAB — POCT INR: INR: 2

## 2017-03-07 NOTE — Patient Instructions (Signed)
Patient educated about medication as defined in this encounter and verbalized understanding by repeating back instructions provided.   

## 2017-03-07 NOTE — Progress Notes (Addendum)
Anticoagulation Management Jessica Reilly is a 56 y.o. female who was contacted for monitoring of warfarin treatment. Patient is using home INR meter with [employer], reports 1-2 times per year for face-to-face INR visits.  Indication: DVT and factor V Leiden deficiency Duration: indefinite Supervising physician: Murriel Hopper  Anticoagulation Clinic Visit History: Patient does not report signs/symptoms of bleeding or thromboembolism  Anticoagulation Episode Summary    Current INR goal:   2.0-3.0  TTR:   71.2 % (4.9 y)  Next INR check:   03/21/2017  INR from last check:   2.0 (03/07/2017)  Weekly max warfarin dose:     Target end date:   04/11/2015  INR check location:     Preferred lab:     Send INR reminders to:   [facility]   Indications   DVT of upper extremity (deep vein thrombosis) ([facility]) [I82.629] Factor V Leiden mutation ([place]) [D68.51] Coagulopathy ([place]) [D68.9] Chronic anticoagulation [Z79.01]       Comments:         Anticoagulation Care Providers    Provider Role Specialty Phone number   Annia Belt, MD Referring Oncology [phone]     ASSESSMENT Recent Results: The most recent result is correlated with 45 mg per week: Lab Results  Component Value Date   INR 2.0 03/07/2017   INR 3.6 02/20/2017   INR 3.0 02/13/2017   PROTIME 27.6 (H) 07/30/2013   Anticoagulation Dosing: INR as of 03/07/2017 and Previous Warfarin Dosing Information    INR Dt INR Goal Wkly Tot Sun Mon Tue Wed Thu Fri Sat   03/07/2017 2.8 2.0-3.0 45 mg 7.5 mg 7.5 mg 3.75 mg 7.5 mg 3.75 mg 7.5 mg 7.5 mg   Patient deviated from recommended dosing.       Previous description   DR [name] (route warfarin notes and list as authorizing provider for LOS & Follow-up, lab orders and warfarin prescriptions), do not list attending physician    Anticoagulation Warfarin Dose Instructions as of 03/07/2017      Total Sun Mon Tue Wed Thu Fri Sat   New Dose 45 mg 7.5  mg 7.5 mg 3.75 mg 7.5 mg 3.75 mg 7.5 mg 7.5 mg     (7.5 mg x 1)  (7.5 mg x 1)  (7.5 mg x 0.5)  (7.5 mg x 1)  (7.5 mg x 0.5)  (7.5 mg x 1)  (7.5 mg x 1)                         Description   DR [name] (route warfarin notes and list as authorizing provider for LOS & Follow-up, lab orders and warfarin prescriptions), do not list attending physician      INR today: Therapeutic  PLAN Weekly dose was unchanged   Patient Instructions  Patient educated about medication as defined in this encounter and verbalized understanding by repeating back instructions provided.   Patient advised to contact clinic or seek medical attention if signs/symptoms of bleeding or thromboembolism occur.  Patient verbalized understanding by repeating back information and was advised to contact me if further medication-related questions arise. Patient was also provided an information handout.  Follow-up Return in about 2 weeks (around 03/21/2017).  [name]

## 2017-03-07 NOTE — Progress Notes (Signed)
Reviewed Thanks DrG 

## 2017-03-27 DIAGNOSIS — E538 Deficiency of other specified B group vitamins: Secondary | ICD-10-CM | POA: Diagnosis not present

## 2017-03-27 LAB — PROTIME-INR

## 2017-04-03 ENCOUNTER — Encounter: Payer: Self-pay | Admitting: Pharmacist

## 2017-04-03 DIAGNOSIS — D689 Coagulation defect, unspecified: Secondary | ICD-10-CM

## 2017-04-03 DIAGNOSIS — D6851 Activated protein C resistance: Secondary | ICD-10-CM

## 2017-04-03 DIAGNOSIS — I82622 Acute embolism and thrombosis of deep veins of left upper extremity: Secondary | ICD-10-CM

## 2017-04-03 DIAGNOSIS — Z7901 Long term (current) use of anticoagulants: Secondary | ICD-10-CM

## 2017-04-03 LAB — POCT INR: INR: 2.6

## 2017-04-03 NOTE — Progress Notes (Signed)
Anticoagulation Management Jessica Reilly is a 56 y.o. female who reports to the clinic for monitoring of warfarin treatment.    Indication: DVT history, Factor V Leiden mutation Duration: indefinite Supervising physician: Murriel Hopper  Anticoagulation Clinic Visit History: Patient does not report signs/symptoms of bleeding or thromboembolism  Anticoagulation Episode Summary    Current INR goal:   2.0-3.0  TTR:   71.2 % (4.9 y)  Next INR check:   03/21/2017  INR from last check:   2.0 (03/07/2017)  Weekly max warfarin dose:     Target end date:   04/11/2015  INR check location:     Preferred lab:     Send INR reminders to:   [facility]   Indications   DVT of upper extremity (deep vein thrombosis) (HCC) [I82.629] Factor V Leiden mutation ([place]) [D68.51] Coagulopathy ([place]) [D68.9] Chronic anticoagulation [Z79.01]       Comments:         Anticoagulation Care Providers    Provider Role Specialty Phone number   Annia Belt, MD Referring Oncology [phone]     ASSESSMENT Recent Results: The most recent result is correlated with 45 mg per week: Lab Results  Component Value Date   INR 2.0 03/07/2017   INR 3.6 02/20/2017   INR 3.0 02/13/2017   PROTIME 27.6 (H) 07/30/2013   Anticoagulation Dosing: INR as of 03/07/2017 and Previous Warfarin Dosing Information    INR Dt INR Goal Madilyn Fireman Sun Mon Tue Wed Thu Fri Sat   03/07/2017 2.0 2.0-3.0 45 mg 7.5 mg 7.5 mg 3.75 mg 7.5 mg 3.75 mg 7.5 mg 7.5 mg   Patient deviated from recommended dosing.       Previous description   DR [name] (route warfarin notes and list as authorizing provider for LOS & Follow-up, lab orders and warfarin prescriptions), do not list attending physician    Anticoagulation Warfarin Dose Instructions as of 03/07/2017      Total Sun Mon Tue Wed Thu Fri Sat   New Dose 45 mg 7.5 mg 7.5 mg 3.75 mg 7.5 mg 3.75 mg 7.5 mg 7.5 mg     (7.5 mg x 1)  (7.5 mg x 1)  (7.5 mg x  0.5)  (7.5 mg x 1)  (7.5 mg x 0.5)  (7.5 mg x 1)  (7.5 mg x 1)                         Description   DR [id number] PATIENT (route warfarin notes and list as authorizing provider for LOS & Follow-up, lab orders and warfarin prescriptions), do not list attending physician      INR today: Therapeutic  PLAN Weekly dose was unchanged  There are no Patient Instructions on file for this visit. Patient advised to contact clinic or seek medical attention if signs/symptoms of bleeding or thromboembolism occur.  Patient verbalized understanding by repeating back information and was advised to contact me if further medication-related questions arise. Patient was also provided an information handout.  Follow-up No Follow-up on file.  [name]

## 2017-04-03 NOTE — Progress Notes (Signed)
Reviewed thx drG 

## 2017-04-05 DIAGNOSIS — C50111 Malignant neoplasm of central portion of right female breast: Secondary | ICD-10-CM | POA: Diagnosis not present

## 2017-04-05 DIAGNOSIS — M25512 Pain in left shoulder: Secondary | ICD-10-CM | POA: Diagnosis not present

## 2017-04-05 DIAGNOSIS — M25511 Pain in right shoulder: Secondary | ICD-10-CM | POA: Diagnosis not present

## 2017-04-05 DIAGNOSIS — D6851 Activated protein C resistance: Secondary | ICD-10-CM | POA: Diagnosis not present

## 2017-04-05 DIAGNOSIS — Z483 Aftercare following surgery for neoplasm: Secondary | ICD-10-CM | POA: Diagnosis not present

## 2017-04-05 DIAGNOSIS — Z7901 Long term (current) use of anticoagulants: Secondary | ICD-10-CM | POA: Diagnosis not present

## 2017-04-05 DIAGNOSIS — M19011 Primary osteoarthritis, right shoulder: Secondary | ICD-10-CM | POA: Diagnosis not present

## 2017-04-05 DIAGNOSIS — Z79811 Long term (current) use of aromatase inhibitors: Secondary | ICD-10-CM | POA: Diagnosis not present

## 2017-04-05 DIAGNOSIS — Z9013 Acquired absence of bilateral breasts and nipples: Secondary | ICD-10-CM | POA: Diagnosis not present

## 2017-04-05 DIAGNOSIS — Z86718 Personal history of other venous thrombosis and embolism: Secondary | ICD-10-CM | POA: Diagnosis not present

## 2017-04-05 DIAGNOSIS — G8929 Other chronic pain: Secondary | ICD-10-CM | POA: Diagnosis not present

## 2017-04-05 DIAGNOSIS — M87021 Idiopathic aseptic necrosis of right humerus: Secondary | ICD-10-CM | POA: Diagnosis not present

## 2017-04-05 DIAGNOSIS — M19012 Primary osteoarthritis, left shoulder: Secondary | ICD-10-CM | POA: Diagnosis not present

## 2017-04-10 LAB — PROTIME-INR

## 2017-04-27 ENCOUNTER — Telehealth: Payer: Self-pay | Admitting: *Deleted

## 2017-04-27 NOTE — Telephone Encounter (Signed)
Dr Darnell Level stated - pt stay on same dose 7.5 mg daily except 3.75 mg on T/Th and check INR at the clinic (dose confirmed w/pt) I called pt - stated she has called Mannie Stabile , which she usually does - waiting on her to call back. Informed of Dr Synthia Innocent response - stated she will not be able to come in but there's a lab near her. But she wait on Jen's call ;  Asked if she would inform Delsa Sale of Dr Synthia Innocent response; stated she will.

## 2017-04-27 NOTE — Telephone Encounter (Signed)
Call from [place], [place] - pt's INR is 1.6 today. Mannie Stabile not in her office this afternon. Result given to Dr [name].

## 2017-04-28 NOTE — Telephone Encounter (Signed)
Noted DrG 

## 2017-04-30 ENCOUNTER — Encounter: Payer: Self-pay | Admitting: Pharmacist

## 2017-04-30 DIAGNOSIS — Z7901 Long term (current) use of anticoagulants: Secondary | ICD-10-CM

## 2017-04-30 DIAGNOSIS — D689 Coagulation defect, unspecified: Secondary | ICD-10-CM

## 2017-04-30 DIAGNOSIS — D6851 Activated protein C resistance: Secondary | ICD-10-CM

## 2017-04-30 DIAGNOSIS — I82622 Acute embolism and thrombosis of deep veins of left upper extremity: Secondary | ICD-10-CM

## 2017-04-30 LAB — POCT INR: INR: 1.7

## 2017-04-30 NOTE — Progress Notes (Signed)
Anticoagulation Management Jessica Reilly is a 56 y.o. female who reports to the clinic for monitoring of warfarin treatment.    Indication: DVT history, Factor V Leiden mutation Duration: indefinite Supervising physician: Murriel Hopper  Anticoagulation Clinic Visit History: Patient does not report signs/symptoms of bleeding or thromboembolism  Anticoagulation Episode Summary    Current INR goal:   2.0-3.0  TTR:   71.6 % (5 y)  Next INR check:   05/07/2017  INR from last check:   1.7! (04/30/2017)  Weekly max warfarin dose:     Target end date:   04/11/2015  INR check location:     Preferred lab:     Send INR reminders to:   [facility]   Indications   DVT of upper extremity (deep vein thrombosis) (HCC) [I82.629] Factor V Leiden mutation ([place]) [D68.51] Coagulopathy ([place]) [D68.9] Chronic anticoagulation [Z79.01]       Comments:         Anticoagulation Care Providers    Provider Role Specialty Phone number   Annia Belt, MD Referring Oncology [phone]     ASSESSMENT Recent Results: Lab Results  Component Value Date   INR 1.7 04/30/2017   INR 2.6 04/03/2017   INR 2.0 03/07/2017   PROTIME 27.6 (H) 07/30/2013   Anticoagulation Dosing: INR as of 04/30/2017 and Previous Warfarin Dosing Information    INR Dt INR Goal Wkly Tot Sun Mon Tue Wed Thu Fri Sat   04/30/2017 1.7 2.0-3.0 45 mg 7.5 mg 7.5 mg 3.75 mg 7.5 mg 3.75 mg 7.5 mg 7.5 mg    Previous description   DR [name] (route warfarin notes and list as authorizing provider for LOS & Follow-up, lab orders and warfarin prescriptions), do not list attending physician    Anticoagulation Warfarin Dose Instructions as of 04/30/2017      Total Sun Mon Tue Wed Thu Fri Sat   New Dose 52.5 mg 7.5 mg 7.5 mg 7.5 mg 7.5 mg 7.5 mg 7.5 mg 7.5 mg     (7.5 mg x 1)  (7.5 mg x 1)  (7.5 mg x 1)  (7.5 mg x 1)  (7.5 mg x 1)  (7.5 mg x 1)  (7.5 mg x 1)                         Description   DR  [id number] PATIENT (route warfarin notes and list as authorizing provider for LOS & Follow-up, lab orders and warfarin prescriptions), do not list attending physician      INR today: Subtherapeutic  PLAN Patient unable to come into clinic today, will try this week. Instructed patient to take 1 and 1/2 tablet today, then take 1 tablet daily (dose was increased by 17%).  Patient Instructions  Patient educated about medication as defined in this encounter and verbalized understanding by repeating back instructions provided.   Patient advised to contact clinic or seek medical attention if signs/symptoms of bleeding or thromboembolism occur.  Patient verbalized understanding by repeating back information and was advised to contact me if further medication-related questions arise. Patient was also provided an information handout.  Follow-up Return in about 1 week (around 05/07/2017).  Jessica Reilly

## 2017-04-30 NOTE — Patient Instructions (Signed)
Patient educated about medication as defined in this encounter and verbalized understanding by repeating back instructions provided.   

## 2017-05-01 NOTE — Progress Notes (Signed)
Reviewed Thanks DrG 

## 2017-05-03 LAB — PROTIME-INR: INR: 1.6 — AB (ref 0.9–1.1)

## 2017-05-04 ENCOUNTER — Encounter: Payer: Self-pay | Admitting: Pharmacist

## 2017-05-04 DIAGNOSIS — D689 Coagulation defect, unspecified: Secondary | ICD-10-CM

## 2017-05-04 DIAGNOSIS — I82622 Acute embolism and thrombosis of deep veins of left upper extremity: Secondary | ICD-10-CM

## 2017-05-04 DIAGNOSIS — Z7901 Long term (current) use of anticoagulants: Secondary | ICD-10-CM

## 2017-05-04 DIAGNOSIS — D6851 Activated protein C resistance: Secondary | ICD-10-CM

## 2017-05-04 LAB — POCT INR: INR: 2.5

## 2017-05-04 NOTE — Patient Instructions (Signed)
Patient educated about medication as defined in this encounter and verbalized understanding by repeating back instructions provided.   

## 2017-05-04 NOTE — Progress Notes (Addendum)
Anticoagulation Management Jessica Reilly is a 56 y.o. female who was contacted for monitoring of warfarin treatment. Patient has a home INR meter through [employer].  Indication: DVT history, Factor V Leiden mutation Duration: indefinite Supervising physician: Murriel Hopper  Anticoagulation Clinic Visit History: Patient does not report signs/symptoms of bleeding or thromboembolism  Anticoagulation Episode Summary    Current INR goal:   2.0-3.0  TTR:   71.3 % (5 y)  Next INR check:   05/18/2017  INR from last check:   2.5 (05/04/2017)  Weekly max warfarin dose:     Target end date:   04/11/2015  INR check location:     Preferred lab:     Send INR reminders to:   [facility]   Indications   DVT of upper extremity (deep vein thrombosis) ([name]) [I82.629] Factor V Leiden mutation ([name]) [D68.51] Coagulopathy ([name]) [D68.9] Chronic anticoagulation [Z79.01]       Comments:         Anticoagulation Care Providers    Provider Role Specialty Phone number   Annia Belt, MD Referring Oncology [phone]     ASSESSMENT Recent Results: Lab Results  Component Value Date   INR 2.5 05/04/2017   INR 1.7 04/30/2017   INR 1.6 (A) 04/27/2017   PROTIME 27.6 (H) 07/30/2013   Anticoagulation Dosing: INR as of 05/04/2017 and Previous Warfarin Dosing Information    INR Dt INR Goal Wkly Tot Sun Mon Tue Wed Thu Fri Sat   05/04/2017 2.5 2.0-3.0 52.5 mg 7.5 mg 7.5 mg 7.5 mg 7.5 mg 7.5 mg 7.5 mg 7.5 mg    Previous description   DR [name] (route warfarin notes and list as authorizing provider for LOS & Follow-up, lab orders and warfarin prescriptions), do not list attending physician    Anticoagulation Warfarin Dose Instructions as of 05/04/2017      Total Sun Mon Tue Wed Thu Fri Sat   New Dose 52.5 mg 7.5 mg 7.5 mg 7.5 mg 7.5 mg 7.5 mg 7.5 mg 7.5 mg     (7.5 mg x 1)  (7.5 mg x 1)  (7.5 mg x 1)  (7.5 mg x 1)  (7.5 mg x 1)  (7.5 mg x 1)  (7.5 mg x 1)                          Description   DR [id number] PATIENT      INR today: Therapeutic  PLAN Weekly dose was unchanged  Patient Instructions  Patient educated about medication as defined in this encounter and verbalized understanding by repeating back instructions provided.    Patient advised to contact clinic or seek medical attention if signs/symptoms of bleeding or thromboembolism occur.  Patient verbalized understanding by repeating back information and was advised to contact me if further medication-related questions arise. Patient was also provided an information handout.  Follow-up Return in about 2 weeks (around 05/18/2017).  [name]

## 2017-05-04 NOTE — Progress Notes (Signed)
Reviewed Thanks DrG 

## 2017-05-15 ENCOUNTER — Encounter: Payer: Self-pay | Admitting: Pharmacist

## 2017-05-15 DIAGNOSIS — D6851 Activated protein C resistance: Secondary | ICD-10-CM

## 2017-05-15 DIAGNOSIS — D689 Coagulation defect, unspecified: Secondary | ICD-10-CM

## 2017-05-15 DIAGNOSIS — Z7901 Long term (current) use of anticoagulants: Secondary | ICD-10-CM

## 2017-05-15 DIAGNOSIS — I82622 Acute embolism and thrombosis of deep veins of left upper extremity: Secondary | ICD-10-CM

## 2017-05-15 LAB — POCT INR: INR: 2.6

## 2017-05-15 NOTE — Patient Instructions (Signed)
Patient educated about medication as defined in this encounter and verbalized understanding by repeating back instructions provided.   

## 2017-05-15 NOTE — Progress Notes (Signed)
Anticoagulation Management Jessica H Barkeris a 56 y.o.femalewho was contacted for monitoring of warfarintreatment. Patient has a home INR meter through [employer].  Indication: DVThistory, Factor V Leiden mutation Duration: indefinite Supervising physician: Murriel Hopper  Anticoagulation Clinic Visit History: Patient does not report signs/symptoms of bleeding or thromboembolism  Anticoagulation Episode Summary    Current INR goal:   2.0-3.0  TTR:   71.5 % (5.1 y)  Next INR check:   05/29/2017  INR from last check:   2.6 (05/15/2017)  Weekly max warfarin dose:     Target end date:   04/11/2015  INR check location:     Preferred lab:     Send INR reminders to:   [facility]   Indications   DVT of upper extremity (deep vein thrombosis) ([place]) [I82.629] Factor V Leiden mutation ([place]) [D68.51] Coagulopathy ([place]) [D68.9] Chronic anticoagulation [Z79.01]       Comments:         Anticoagulation Care Providers    Provider Role Specialty Phone number   Annia Belt, MD Referring Oncology [phone]      Allergies  Allergen Reactions  . Amoxicillin-Pot Clavulanate Rash  . Ceftin Rash  . Cefuroxime Rash  . Cefuroxime Axetil Rash  . Risperidone And Related Other (See Comments)    Has medical contraindications   Medication Sig  acetaminophen (TYLENOL) 500 MG tablet Take 1,000 mg by mouth as needed.  alprazolam (XANAX) 2 MG tablet Take 2 mg by mouth 2 (two) times daily as needed (May take a 1 mg during the day but always takes 2 mg at bedtime.). For anxiety  anastrozole (ARIMIDEX) 1 MG tablet Take 1 mg by mouth daily.    cyanocobalamin (,VITAMIN B-12,) 1000 MCG/ML injection Inject 100 mcg into the muscle every 30 (thirty) days.   docusate sodium (STOOL SOFTENER) 100 MG capsule Take 2 capsules (200 mg total) by mouth at bedtime.  esomeprazole (NEXIUM) 40 MG capsule Take 40 mg by mouth 2 (two) times daily before a meal.  fish oil-omega-3 fatty acids 1000 MG  capsule Take 1 g by mouth 2 (two) times daily.  fluticasone (FLONASE) 50 MCG/ACT nasal spray Place 2 sprays into the nose daily.   levocetirizine (XYZAL) 5 MG tablet TAKE ONE TABLET BY MOUTH AT BEDTIME.  lidocaine (LIDODERM) 5 % Place 1 patch onto the skin as needed. Remove & Discard patch within 12 hours or as directed by MD (only uses prn)  metoprolol (TOPROL-XL) 100 MG 24 hr tablet Take 100 mg by mouth daily.    OLANZapine (ZYPREXA) 2.5 MG tablet Take 1 tablet (2.5 mg total) by mouth at bedtime. Patient not taking: Reported on 12/08/2016  ondansetron (ZOFRAN ODT) 4 MG disintegrating tablet Take 1 tablet (4 mg total) by mouth every 8 (eight) hours as needed for nausea or vomiting. Patient not taking: Reported on 11/30/2016  promethazine (PHENERGAN) 25 MG tablet Take 25 mg by mouth as needed for nausea.   simvastatin (ZOCOR) 20 MG tablet Take 20 mg by mouth daily.   warfarin (COUMADIN) 7.5 MG tablet Take 1 tablet (7.5 mg total) by mouth daily. Adjust per coumadin clinic (take 1/2 tablet on Wednesdays)   Past Medical History:  Diagnosis Date  . Abnormal Pap smear   . Acid reflux   . Bipolar disorder ([place]) 03/22/2013  . Breast cancer ([place])   . Breast disorder    cancer  . Chronic anticoagulation 03/22/2013  . Coagulopathy ([place]) 04/10/2012  . Constipation   . DVT of upper  extremity (deep vein thrombosis) ([place]) 04/10/2012  . Factor V Leiden ([place])   . Factor V Leiden mutation ([place]) 04/10/2012  . Family history of ovarian cancer   . Fibroid   . Gallstones   . Heart murmur   . High cholesterol   . History of abnormal cervical Pap smear 01/06/2015  . History of breast cancer 01/06/2015  . HTN (hypertension)   . HX: breast cancer   . Hypertension 12/31/2012  . Kidney stones   . Lobular carcinoma of breast, estrogen receptor positive, 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  . Mental disorder    panic attacks  . Osteoarthritis   . Pain in both knees   . Vaginal Pap smear, abnormal    Social History   Social History   . Marital status: Divorced    Spouse name: N/A  . Number of children: N/A  . Years of education: N/A   Social History Main Topics  . Smoking status: Never Smoker  . Smokeless tobacco: Never Used  . Alcohol use 0.0 oz/week     Comment: 07-25-2016 per pt rarely  . Drug use: No     Comment: 07-25-2016 Per pt no   . Sexual activity: No     Comment: partial hyst   Other Topics Concern  . Not on file   Social History Narrative  . No narrative on file   Family History  Problem Relation Age of Onset  . Alcohol abuse Mother   . COPD Mother   . Alcohol abuse Father   . Heart disease Father   . Pneumonia Father   . Diabetes Father   . Colon cancer Father        Dx 47s; deceased 52s  . OCD Daughter   . Bipolar disorder Daughter   . Hypertension Daughter   . Schizophrenia Daughter   . Schizophrenia Daughter   . Bipolar disorder Daughter   . OCD Daughter   . Hypertension Daughter   . COPD Daughter   . Other Daughter        stomach don't empty out  . ADD / ADHD Grandchild   . Anxiety disorder Grandchild   . Depression Grandchild   . Healthy Sister   . Cancer Brother        leukemia 41s; deceased 58  . Heart disease Brother   . Cancer Brother        liver; currently 91  . Cirrhosis Brother   . Other Brother        waiting for liver transplant  . Alzheimer's disease Paternal Grandmother   . Ovarian cancer Maternal Grandmother 45       deceased 100  . Cancer Maternal Uncle        3 of 6 with various cancers: skin, NHL, Hodkin's Dx  . Breast cancer Cousin 77       mat first cousin related through unaffected aunt   ASSESSMENT Recent Results: The most recent result is correlated with 52.5 mg per week: Lab Results  Component Value Date   INR 2.6 05/15/2017   INR 2.5 05/04/2017   INR 1.7 04/30/2017   PROTIME 27.6 (H) 07/30/2013   Anticoagulation Dosing: INR as of 05/15/2017 and Previous Warfarin Dosing Information    INR Dt INR Goal Jessica Reilly Sun Mon Tue Wed Thu Fri  Sat   05/15/2017 2.6 2.0-3.0 52.5 mg 7.5 mg 7.5 mg 7.5 mg 7.5 mg 7.5 mg 7.5 mg 7.5 mg    Previous description   DR [name]  PATIENT    Anticoagulation Warfarin Dose Instructions as of 05/15/2017      Total Sun Mon Tue Wed Thu Fri Sat   New Dose 52.5 mg 7.5 mg 7.5 mg 7.5 mg 7.5 mg 7.5 mg 7.5 mg 7.5 mg     (7.5 mg x 1)  (7.5 mg x 1)  (7.5 mg x 1)  (7.5 mg x 1)  (7.5 mg x 1)  (7.5 mg x 1)  (7.5 mg x 1)                         Description   DR [id number] PATIENT      INR today: Therapeutic  PLAN Weekly dose was unchanged  Patient Instructions  Patient educated about medication as defined in this encounter and verbalized understanding by repeating back instructions provided.   Patient advised to contact clinic or seek medical attention if signs/symptoms of bleeding or thromboembolism occur.  Patient verbalized understanding by repeating back information and was advised to contact me if further medication-related questions arise. Patient was also provided an information handout.  Follow-up 2 weeks (around 05/29/2017).  [name]

## 2017-05-15 NOTE — Progress Notes (Signed)
Reviewed thx DrG 

## 2017-05-22 ENCOUNTER — Ambulatory Visit (HOSPITAL_COMMUNITY): Payer: Medicare Other | Admitting: Psychiatry

## 2017-05-24 LAB — PROTIME-INR

## 2017-05-25 ENCOUNTER — Ambulatory Visit (INDEPENDENT_AMBULATORY_CARE_PROVIDER_SITE_OTHER): Payer: Medicare Other | Admitting: Adult Health

## 2017-05-25 ENCOUNTER — Other Ambulatory Visit (HOSPITAL_COMMUNITY)
Admission: RE | Admit: 2017-05-25 | Discharge: 2017-05-25 | Disposition: A | Discharge status: Home / Self Care | Payer: Medicare Other | Source: Ambulatory Visit | Attending: Adult Health | Admitting: Adult Health

## 2017-05-25 ENCOUNTER — Encounter: Payer: Self-pay | Admitting: Adult Health

## 2017-05-25 VITALS — BP 152/94 | HR 70 | Ht 61.5 in | Wt 212.5 lb

## 2017-05-25 DIAGNOSIS — F32A Depression, unspecified: Secondary | ICD-10-CM | POA: Insufficient documentation

## 2017-05-25 DIAGNOSIS — Z1211 Encounter for screening for malignant neoplasm of colon: Secondary | ICD-10-CM | POA: Diagnosis not present

## 2017-05-25 DIAGNOSIS — Z01419 Encounter for gynecological examination (general) (routine) without abnormal findings: Secondary | ICD-10-CM | POA: Diagnosis not present

## 2017-05-25 DIAGNOSIS — D510 Vitamin B12 deficiency anemia due to intrinsic factor deficiency: Secondary | ICD-10-CM | POA: Diagnosis not present

## 2017-05-25 DIAGNOSIS — C50011 Malignant neoplasm of nipple and areola, right female breast: Secondary | ICD-10-CM | POA: Diagnosis not present

## 2017-05-25 DIAGNOSIS — Z1212 Encounter for screening for malignant neoplasm of rectum: Secondary | ICD-10-CM | POA: Diagnosis not present

## 2017-05-25 DIAGNOSIS — Z8742 Personal history of other diseases of the female genital tract: Secondary | ICD-10-CM

## 2017-05-25 DIAGNOSIS — R5383 Other fatigue: Secondary | ICD-10-CM | POA: Diagnosis not present

## 2017-05-25 DIAGNOSIS — R809 Proteinuria, unspecified: Secondary | ICD-10-CM | POA: Diagnosis not present

## 2017-05-25 DIAGNOSIS — Z01411 Encounter for gynecological examination (general) (routine) with abnormal findings: Secondary | ICD-10-CM

## 2017-05-25 DIAGNOSIS — Z853 Personal history of malignant neoplasm of breast: Secondary | ICD-10-CM

## 2017-05-25 DIAGNOSIS — R319 Hematuria, unspecified: Secondary | ICD-10-CM | POA: Diagnosis not present

## 2017-05-25 DIAGNOSIS — F329 Major depressive disorder, single episode, unspecified: Secondary | ICD-10-CM | POA: Diagnosis not present

## 2017-05-25 DIAGNOSIS — Z87898 Personal history of other specified conditions: Secondary | ICD-10-CM | POA: Diagnosis not present

## 2017-05-25 DIAGNOSIS — C50012 Malignant neoplasm of nipple and areola, left female breast: Secondary | ICD-10-CM | POA: Diagnosis not present

## 2017-05-25 LAB — POCT URINALYSIS DIPSTICK
GLUCOSE UA: NEGATIVE
KETONES UA: NEGATIVE
LEUKOCYTES UA: NEGATIVE
Nitrite, UA: NEGATIVE

## 2017-05-25 LAB — HEMOCCULT GUIAC POC 1CARD (OFFICE): Fecal Occult Blood, POC: NEGATIVE

## 2017-05-25 NOTE — Addendum Note (Signed)
Addended by: Linton Rump on: 05/25/2017 02:07 PM   Modules accepted: Orders

## 2017-05-25 NOTE — Progress Notes (Signed)
Patient ID: Jessica Reilly, female   DOB: 1960-11-24, 56 y.o.   MRN: [id number] History of Present Illness: Jessica Reilly is a 56 year old white female in for well woman gyn exam and pap. PCP is Dr Hilma Favors.   Current Medications, Allergies, Past Medical History, Past Surgical History, Family History and Social History were reviewed in [employer] record.     Review of Systems: Patient denies any headaches, hearing loss, blurred vision, shortness of breath, chest pain, abdominal pain, problems with bowel movements, urination, or intercourse(not having sex). No mood swings. +tired, +pain right shoulder and both knees and left flank area   Physical Exam:BP (!) 152/94 (BP Location: Left Arm, Patient Position: Sitting, Cuff Size: Large)   Pulse 70   Ht 5' 1.5" (1.562 m)   Wt 212 lb 8 oz (96.4 kg)   BMI 39.50 kg/m urine trace protein and trace blood General:  Well developed, well nourished, no acute distress Skin:  Warm and dry Neck:  Midline trachea, normal thyroid, good ROM, no lymphadenopathy Lungs; Clear to auscultation bilaterally Breast:  Absent, no masses or tenderness on chest wall Cardiovascular: Regular rate and rhythm Abdomen:  Soft, non tender, no hepatosplenomegaly, no CVAT Pelvic:  External genitalia is normal in appearance, no lesions.  The vagina is normal in appearance. Urethra has no lesions or masses. The cervix is smooth, pap with HPV performed.  Uterus is felt to be normal size, shape, and contour.  No adnexal masses or tenderness noted.Bladder is non tender, no masses felt. Rectal: Good sphincter tone, no polyps, or hemorrhoids felt.  Hemoccult negative. Extremities/musculoskeletal:  No swelling or varicosities noted, no clubbing or cyanosis Psych:  No mood changes, alert and cooperative,seems happy PHQ 9 score 24, denies being suicidal but has pain and gets depressed, take xanax from PCP.  Impression: 1. Encounter for gynecological examination with  Papanicolaou smear of cervix   2. History of breast cancer   3. History of abnormal cervical Pap smear   4. Proteinuria, unspecified type   5. Hematuria, unspecified type   6. Fatigue, unspecified type   7. Depression, unspecified depression type   8. Screening for colorectal cancer       Plan: UA C&S sent  Check CBC,CMP and TSH Get [facility] 8/22 at 1:15 [facility]  Physical in 1 year Pap in 3 if normal Mammogram yearly Colonoscopy per GI Labs with PCP

## 2017-05-26 LAB — URINALYSIS, ROUTINE W REFLEX MICROSCOPIC
Bilirubin, UA: NEGATIVE
Glucose, UA: NEGATIVE
KETONES UA: NEGATIVE
Leukocytes, UA: NEGATIVE
Nitrite, UA: NEGATIVE
SPEC GRAV UA: 1.026 (ref 1.005–1.030)
Urobilinogen, Ur: 0.2 mg/dL (ref 0.2–1.0)
pH, UA: 5.5 (ref 5.0–7.5)

## 2017-05-26 LAB — CBC
HEMOGLOBIN: 13.3 g/dL (ref 11.1–15.9)
Hematocrit: 38.4 % (ref 34.0–46.6)
MCH: 30.5 pg (ref 26.6–33.0)
MCHC: 34.6 g/dL (ref 31.5–35.7)
MCV: 88 fL (ref 79–97)
Platelets: 307 10*3/uL (ref 150–379)
RBC: 4.36 x10E6/uL (ref 3.77–5.28)
RDW: 15.4 % (ref 12.3–15.4)
WBC: 8.5 10*3/uL (ref 3.4–10.8)

## 2017-05-26 LAB — COMPREHENSIVE METABOLIC PANEL
A/G RATIO: 1.5 (ref 1.2–2.2)
ALBUMIN: 4.3 g/dL (ref 3.5–5.5)
ALT: 18 IU/L (ref 0–32)
AST: 16 IU/L (ref 0–40)
Alkaline Phosphatase: 75 IU/L (ref 39–117)
BUN / CREAT RATIO: 10 (ref 9–23)
BUN: 10 mg/dL (ref 6–24)
Bilirubin Total: 0.3 mg/dL (ref 0.0–1.2)
CALCIUM: 9.6 mg/dL (ref 8.7–10.2)
CO2: 24 mmol/L (ref 20–29)
CREATININE: 1.03 mg/dL — AB (ref 0.57–1.00)
Chloride: 103 mmol/L (ref 96–106)
GFR, EST AFRICAN AMERICAN: 69 mL/min/{1.73_m2} (ref >59)
GFR, EST NON AFRICAN AMERICAN: 60 mL/min/{1.73_m2} (ref >59)
GLOBULIN, TOTAL: 2.8 g/dL (ref 1.5–4.5)
Glucose: 89 mg/dL (ref 65–99)
POTASSIUM: 4.4 mmol/L (ref 3.5–5.2)
SODIUM: 142 mmol/L (ref 134–144)
Total Protein: 7.1 g/dL (ref 6.0–8.5)

## 2017-05-26 LAB — MICROSCOPIC EXAMINATION
Bacteria, UA: NONE SEEN
Casts: NONE SEEN /lpf

## 2017-05-26 LAB — TSH: TSH: 1.36 u[IU]/mL (ref 0.450–4.500)

## 2017-05-27 LAB — URINE CULTURE

## 2017-05-28 ENCOUNTER — Telehealth: Payer: Self-pay | Admitting: Adult Health

## 2017-05-28 ENCOUNTER — Telehealth (INDEPENDENT_AMBULATORY_CARE_PROVIDER_SITE_OTHER): Payer: Medicare Other | Admitting: Pharmacist

## 2017-05-28 DIAGNOSIS — Z7901 Long term (current) use of anticoagulants: Secondary | ICD-10-CM

## 2017-05-28 DIAGNOSIS — I82629 Acute embolism and thrombosis of deep veins of unspecified upper extremity: Secondary | ICD-10-CM

## 2017-05-28 DIAGNOSIS — D689 Coagulation defect, unspecified: Secondary | ICD-10-CM | POA: Diagnosis not present

## 2017-05-28 DIAGNOSIS — D6851 Activated protein C resistance: Secondary | ICD-10-CM | POA: Diagnosis not present

## 2017-05-28 DIAGNOSIS — I82622 Acute embolism and thrombosis of deep veins of left upper extremity: Secondary | ICD-10-CM

## 2017-05-28 DIAGNOSIS — I82623 Acute embolism and thrombosis of deep veins of upper extremity, bilateral: Secondary | ICD-10-CM

## 2017-05-28 LAB — POCT INR: INR: 3.7

## 2017-05-28 MED ORDER — WARFARIN SODIUM 7.5 MG PO TABS: 7.5000 mg | ORAL_TABLET | Freq: Every day | ORAL | 2 refills | Status: DC

## 2017-05-28 NOTE — Telephone Encounter (Signed)
Reviewed thx DrG 

## 2017-05-28 NOTE — Telephone Encounter (Signed)
Pt aware of urine and labs, probable kidney stone, has [name] 05/30/17 and pap not back

## 2017-05-28 NOTE — Patient Instructions (Signed)
Patient educated about medication as defined in this encounter and verbalized understanding by repeating back instructions provided.   

## 2017-05-28 NOTE — Progress Notes (Signed)
Anticoagulation Management Morris H Barkeris a 56 y.o.femalewho was contactedfor monitoring of warfarintreatment. Patient has a home INR meter through [facility].  Indication: DVThistory, Factor V Leiden mutation Duration: indefinite Supervising physician: Murriel Hopper  Anticoagulation Clinic Visit History: Patient does not report signs/symptoms of bleeding or thromboembolism  Anticoagulation Episode Summary    Current INR goal:   2.0-3.0  TTR:   71.2 % (5.1 y)  Next INR check:   06/04/2017  INR from last check:     Weekly max warfarin dose:     Target end date:   04/11/2015  INR check location:     Preferred lab:     Send INR reminders to:   [facility]   Indications   DVT of upper extremity (deep vein thrombosis) ([place]) [I82.629] Factor V Leiden mutation ([place]) [D68.51] Coagulopathy ([place]) [D68.9] Chronic anticoagulation [Z79.01]       Comments:         Anticoagulation Care Providers    Provider Role Specialty Phone number   Annia Belt, MD Referring Oncology [phone]      Allergies  Allergen Reactions  . Amoxicillin-Pot Clavulanate Rash  . Ceftin Rash  . Cefuroxime Rash  . Cefuroxime Axetil Rash  . Risperidone And Related Other (See Comments)    Has medical contraindications   Medication Sig  acetaminophen (TYLENOL) 500 MG tablet Take 1,000 mg by mouth as needed.  alprazolam (XANAX) 2 MG tablet Take 2 mg by mouth 2 (two) times daily as needed (May take a 1 mg during the day but always takes 2 mg at bedtime.). For anxiety  anastrozole (ARIMIDEX) 1 MG tablet Take 1 mg by mouth daily.    cyanocobalamin (,VITAMIN B-12,) 1000 MCG/ML injection Inject 100 mcg into the muscle every 30 (thirty) days.   docusate sodium (STOOL SOFTENER) 100 MG capsule Take 2 capsules (200 mg total) by mouth at bedtime.  esomeprazole (NEXIUM) 40 MG capsule Take 40 mg by mouth 2 (two) times daily before a meal.  fish oil-omega-3 fatty acids 1000 MG capsule Take 1 g  by mouth 2 (two) times daily.  fluticasone (FLONASE) 50 MCG/ACT nasal spray Place 2 sprays into the nose daily.   levocetirizine (XYZAL) 5 MG tablet TAKE ONE TABLET BY MOUTH AT BEDTIME.  lidocaine (LIDODERM) 5 % Place 1 patch onto the skin as needed. Remove & Discard patch within 12 hours or as directed by MD (only uses prn)  metoprolol (TOPROL-XL) 100 MG 24 hr tablet Take 100 mg by mouth daily.    promethazine (PHENERGAN) 25 MG tablet Take 25 mg by mouth as needed for nausea.   simvastatin (ZOCOR) 20 MG tablet Take 20 mg by mouth daily.   warfarin (COUMADIN) 7.5 MG tablet Take 1 tablet (7.5 mg total) by mouth daily. Adjust per coumadin clinic   Past Medical History:  Diagnosis Date  . Abnormal Pap smear   . Acid reflux   . Bipolar disorder ([place]) 03/22/2013  . Breast cancer ([place])   . Breast disorder    cancer  . Chronic anticoagulation 03/22/2013  . Coagulopathy ([place]) 04/10/2012  . Constipation   . DVT of upper extremity (deep vein thrombosis) ([place]) 04/10/2012  . Factor V Leiden ([place])   . Factor V Leiden mutation ([place]) 04/10/2012  . Family history of ovarian cancer   . Fibroid   . Gallstones   . Heart murmur   . High cholesterol   . History of abnormal cervical Pap smear 01/06/2015  . History of breast  cancer 01/06/2015  . HTN (hypertension)   . HX: breast cancer   . Hypertension 12/31/2012  . Kidney stones   . Lobular carcinoma of breast, estrogen receptor positive, 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  . Mental disorder    panic attacks  . Osteoarthritis   . Pain in both knees   . Vaginal Pap smear, abnormal    Social History   Social History  . Marital status: Divorced    Spouse name: N/A  . Number of children: N/A  . Years of education: N/A   Social History Main Topics  . Smoking status: Never Smoker  . Smokeless tobacco: Never Used  . Alcohol use 0.0 oz/week     Comment: 07-25-2016 per pt rarely  . Drug use: No     Comment: 07-25-2016 Per pt no   . Sexual activity: No     Comment:  partial hyst   Other Topics Concern  . Not on file   Social History Narrative  . No narrative on file   Family History  Problem Relation Age of Onset  . Alcohol abuse Mother   . COPD Mother   . Alcohol abuse Father   . Heart disease Father   . Pneumonia Father   . Diabetes Father   . Colon cancer Father        Dx 55s; deceased 59s  . OCD Daughter   . Bipolar disorder Daughter   . Hypertension Daughter   . Schizophrenia Daughter   . Schizophrenia Daughter   . Bipolar disorder Daughter   . OCD Daughter   . Hypertension Daughter   . COPD Daughter   . Other Daughter        stomach don't empty out  . ADD / ADHD Grandchild   . Anxiety disorder Grandchild   . Depression Grandchild   . Healthy Sister   . Cancer Brother        leukemia 64s; deceased 9  . Heart disease Brother   . Cancer Brother        liver; currently 60  . Cirrhosis Brother   . Other Brother        waiting for liver transplant  . Alzheimer's disease Paternal Grandmother   . Ovarian cancer Maternal Grandmother 50       deceased 43  . Cancer Maternal Uncle        3 of 6 with various cancers: skin, NHL, Hodkin's Dx  . Breast cancer Cousin 34       mat first cousin related through unaffected aunt   ASSESSMENT Recent Results: The most recent result is correlated with 52.5 mg per week: Lab Results  Component Value Date   INR 3.7 05/28/2017   INR 2.6 05/15/2017   INR 2.5 05/04/2017   PROTIME 27.6 (H) 07/30/2013   Anticoagulation Dosing: INR as of 05/28/2017 and Previous Warfarin Dosing Information    INR Dt INR Goal Wkly Tot Sun Mon Tue Wed Thu Fri Sat     2.0-3.0 52.5 mg 7.5 mg 7.5 mg 7.5 mg 7.5 mg 7.5 mg 7.5 mg 7.5 mg    Previous description   DR [name]    Anticoagulation Warfarin Dose Instructions as of 05/28/2017      Total Sun Mon Tue Wed Thu Fri Sat   New Dose 48.75 mg 7.5 mg 3.75 mg 7.5 mg 7.5 mg 7.5 mg 7.5 mg 7.5 mg     (7.5 mg x 1)  (7.5 mg x 0.5)  (7.5 mg x 1)  (  7.5  mg x 1)  (7.5 mg x 1)  (7.5 mg x 1)  (7.5 mg x 1)                         Description   DR [id number] PATIENT      INR today: Therapeutic  PLAN Weekly dose was decreased by 7% to 48.75 mg per week  Patient Instructions  Patient educated about medication as defined in this encounter and verbalized understanding by repeating back instructions provided.   Patient advised to contact clinic or seek medical attention if signs/symptoms of bleeding or thromboembolism occur.  Patient verbalized understanding by repeating back information and was advised to contact me if further medication-related questions arise. Patient was also provided an information handout.  Follow-up Return in about 1 week (around 06/04/2017).  [name]

## 2017-05-29 DIAGNOSIS — D6851 Activated protein C resistance: Secondary | ICD-10-CM | POA: Diagnosis not present

## 2017-05-29 DIAGNOSIS — Z7901 Long term (current) use of anticoagulants: Secondary | ICD-10-CM | POA: Diagnosis not present

## 2017-05-29 LAB — CYTOLOGY - PAP
Diagnosis: NEGATIVE
HPV: NOT DETECTED

## 2017-05-30 ENCOUNTER — Ambulatory Visit (HOSPITAL_COMMUNITY)
Admission: RE | Admit: 2017-05-30 | Discharge: 2017-05-30 | Disposition: A | Discharge status: Home / Self Care | Payer: Medicare Other | Source: Ambulatory Visit | Attending: Adult Health | Admitting: Adult Health

## 2017-05-30 DIAGNOSIS — N270 Small kidney, unilateral: Secondary | ICD-10-CM | POA: Diagnosis not present

## 2017-05-30 DIAGNOSIS — Z87442 Personal history of urinary calculi: Secondary | ICD-10-CM | POA: Diagnosis not present

## 2017-05-30 DIAGNOSIS — R319 Hematuria, unspecified: Secondary | ICD-10-CM | POA: Diagnosis not present

## 2017-05-30 DIAGNOSIS — E538 Deficiency of other specified B group vitamins: Secondary | ICD-10-CM | POA: Diagnosis not present

## 2017-05-30 DIAGNOSIS — R93422 Abnormal radiologic findings on diagnostic imaging of left kidney: Secondary | ICD-10-CM | POA: Diagnosis not present

## 2017-05-30 DIAGNOSIS — J302 Other seasonal allergic rhinitis: Secondary | ICD-10-CM | POA: Diagnosis not present

## 2017-05-30 DIAGNOSIS — J069 Acute upper respiratory infection, unspecified: Secondary | ICD-10-CM | POA: Diagnosis not present

## 2017-05-30 DIAGNOSIS — R93421 Abnormal radiologic findings on diagnostic imaging of right kidney: Secondary | ICD-10-CM | POA: Diagnosis not present

## 2017-05-30 DIAGNOSIS — R7309 Other abnormal glucose: Secondary | ICD-10-CM | POA: Diagnosis not present

## 2017-06-01 ENCOUNTER — Telehealth: Payer: Self-pay | Admitting: Adult Health

## 2017-06-01 NOTE — Telephone Encounter (Signed)
Left mesasge to call me on Monday

## 2017-06-04 ENCOUNTER — Encounter (INDEPENDENT_AMBULATORY_CARE_PROVIDER_SITE_OTHER): Payer: Self-pay | Admitting: Internal Medicine

## 2017-06-04 ENCOUNTER — Telehealth (HOSPITAL_COMMUNITY): Payer: Self-pay | Admitting: *Deleted

## 2017-06-04 ENCOUNTER — Encounter: Payer: Self-pay | Admitting: Adult Health

## 2017-06-04 ENCOUNTER — Telehealth: Payer: Self-pay | Admitting: Adult Health

## 2017-06-04 ENCOUNTER — Encounter: Payer: Self-pay | Admitting: Pharmacist

## 2017-06-04 DIAGNOSIS — N200 Calculus of kidney: Secondary | ICD-10-CM

## 2017-06-04 DIAGNOSIS — I82629 Acute embolism and thrombosis of deep veins of unspecified upper extremity: Secondary | ICD-10-CM

## 2017-06-04 DIAGNOSIS — D6851 Activated protein C resistance: Secondary | ICD-10-CM

## 2017-06-04 DIAGNOSIS — K760 Fatty (change of) liver, not elsewhere classified: Secondary | ICD-10-CM

## 2017-06-04 DIAGNOSIS — D689 Coagulation defect, unspecified: Secondary | ICD-10-CM

## 2017-06-04 DIAGNOSIS — Z7901 Long term (current) use of anticoagulants: Secondary | ICD-10-CM

## 2017-06-04 HISTORY — DX: Fatty (change of) liver, not elsewhere classified: K76.0

## 2017-06-04 HISTORY — DX: Calculus of kidney: N20.0

## 2017-06-04 LAB — POCT INR: INR: 3.4

## 2017-06-04 NOTE — Telephone Encounter (Signed)
Pt aware US shows bilateral non obstructing kidney stones and fatty liver, will refer to Dr Laural Golden to evaluate fatty liver.

## 2017-06-04 NOTE — Telephone Encounter (Signed)
returned phone call to patient, she said her insurance is about to term and she wanted to be seen before this week is out.   Provider is out of office this week, therefore patient said she did not wish to schedule.

## 2017-06-05 ENCOUNTER — Ambulatory Visit: Payer: Self-pay | Admitting: Oncology

## 2017-06-05 NOTE — Progress Notes (Signed)
Anticoagulation Management Jessica H Barkeris a 56 y.o.femalewho was contactedfor monitoring of warfarintreatment. Patient has a home INR meter through [facility].  Indication: DVThistory, Factor V Leiden mutation Duration: indefinite Supervising physician: Murriel Hopper  Anticoagulation Clinic Visit History: Patient does notreport signs/symptoms of bleeding or thromboembolism  Anticoagulation Episode Summary    Current INR goal:   2.0-3.0  TTR:   71.0 % (5.1 y)  Next INR check:   06/11/2017  INR from last check:   3.4! (06/04/2017)  Weekly max warfarin dose:     Target end date:   04/11/2015  INR check location:     Preferred lab:     Send INR reminders to:   [facility]   Indications   DVT of upper extremity (deep vein thrombosis) ([facility]) [I82.629] Factor V Leiden mutation ([place]) [D68.51] Coagulopathy ([place]) [D68.9] Chronic anticoagulation [Z79.01]       Comments:         Anticoagulation Care Providers    Provider Role Specialty Phone number   Annia Belt, MD Referring Oncology [phone]      Allergies  Allergen Reactions  . Amoxicillin-Pot Clavulanate Rash  . Ceftin Rash  . Cefuroxime Rash  . Cefuroxime Axetil Rash  . Risperidone And Related Other (See Comments)    Has medical contraindications   Medication Sig  acetaminophen (TYLENOL) 500 MG tablet Take 1,000 mg by mouth as needed.  alprazolam (XANAX) 2 MG tablet Take 2 mg by mouth 2 (two) times daily as needed (May take a 1 mg during the day but always takes 2 mg at bedtime.). For anxiety  anastrozole (ARIMIDEX) 1 MG tablet Take 1 mg by mouth daily.    cyanocobalamin (,VITAMIN B-12,) 1000 MCG/ML injection Inject 100 mcg into the muscle every 30 (thirty) days.   docusate sodium (STOOL SOFTENER) 100 MG capsule Take 2 capsules (200 mg total) by mouth at bedtime.  esomeprazole (NEXIUM) 40 MG capsule Take 40 mg by mouth 2 (two) times daily before a meal.  fish oil-omega-3 fatty acids 1000 MG  capsule Take 1 g by mouth 2 (two) times daily.  fluticasone (FLONASE) 50 MCG/ACT nasal spray Place 2 sprays into the nose daily.   levocetirizine (XYZAL) 5 MG tablet TAKE ONE TABLET BY MOUTH AT BEDTIME.  lidocaine (LIDODERM) 5 % Place 1 patch onto the skin as needed. Remove & Discard patch within 12 hours or as directed by MD (only uses prn)  metoprolol (TOPROL-XL) 100 MG 24 hr tablet Take 100 mg by mouth daily.    promethazine (PHENERGAN) 25 MG tablet Take 25 mg by mouth as needed for nausea.   simvastatin (ZOCOR) 20 MG tablet Take 20 mg by mouth daily.   warfarin (COUMADIN) 7.5 MG tablet Take 1 tablet (7.5 mg total) by mouth daily. Adjust per coumadin clinic   Past Medical History:  Diagnosis Date  . Abnormal Pap smear   . Acid reflux   . Bipolar disorder ([place]) 03/22/2013  . Breast cancer ([place])   . Breast disorder    cancer  . Chronic anticoagulation 03/22/2013  . Coagulopathy ([place]) 04/10/2012  . Constipation   . DVT of upper extremity (deep vein thrombosis) ([place]) 04/10/2012  . Factor V Leiden ([place])   . Factor V Leiden mutation ([place]) 04/10/2012  . Family history of ovarian cancer   . Fatty liver 06/04/2017  . Fibroid   . Gallstones   . Heart murmur   . High cholesterol   . History of abnormal cervical Pap smear 01/06/2015  .  History of breast cancer 01/06/2015  . HTN (hypertension)   . HX: breast cancer   . Hypertension 12/31/2012  . Kidney stones   . Kidney stones 06/04/2017  . Lobular carcinoma of breast, estrogen receptor positive, 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  . Mental disorder    panic attacks  . Osteoarthritis   . Pain in both knees   . Vaginal Pap smear, abnormal    Social History   Social History  . Marital status: Divorced    Spouse name: N/A  . Number of children: N/A  . Years of education: N/A   Social History Main Topics  . Smoking status: Never Smoker  . Smokeless tobacco: Never Used  . Alcohol use 0.0 oz/week     Comment: 07-25-2016 per pt rarely  . Drug use: No      Comment: 07-25-2016 Per pt no   . Sexual activity: No     Comment: partial hyst   Other Topics Concern  . Not on file   Social History Narrative  . No narrative on file   Family History  Problem Relation Age of Onset  . Alcohol abuse Mother   . COPD Mother   . Alcohol abuse Father   . Heart disease Father   . Pneumonia Father   . Diabetes Father   . Colon cancer Father        Dx 53s; deceased 73s  . OCD Daughter   . Bipolar disorder Daughter   . Hypertension Daughter   . Schizophrenia Daughter   . Schizophrenia Daughter   . Bipolar disorder Daughter   . OCD Daughter   . Hypertension Daughter   . COPD Daughter   . Other Daughter        stomach don't empty out  . ADD / ADHD Grandchild   . Anxiety disorder Grandchild   . Depression Grandchild   . Healthy Sister   . Cancer Brother        leukemia 29s; deceased 35  . Heart disease Brother   . Cancer Brother        liver; currently 53  . Cirrhosis Brother   . Other Brother        waiting for liver transplant  . Alzheimer's disease Paternal Grandmother   . Ovarian cancer Maternal Grandmother 71       deceased 80  . Cancer Maternal Uncle        3 of 6 with various cancers: skin, NHL, Hodkin's Dx  . Breast cancer Cousin 47       mat first cousin related through unaffected aunt   ASSESSMENT Recent Results: Lab Results  Component Value Date   INR 3.4 06/04/2017   INR 3.7 05/28/2017   INR 2.6 05/15/2017   PROTIME 27.6 (H) 07/30/2013   Anticoagulation Dosing: INR as of 06/04/2017 and Previous Warfarin Dosing Information    INR Dt INR Goal [name] Sun Mon Tue Wed Thu Fri Sat   06/04/2017 3.4 2.0-3.0 48.75 mg 7.5 mg 3.75 mg 7.5 mg 7.5 mg 7.5 mg 7.5 mg 7.5 mg    Previous description   DR [name]    Anticoagulation Warfarin Dose Instructions as of 06/04/2017      Total Sun Mon Tue Wed Thu Fri Sat   New Dose 45 mg 7.5 mg 3.75 mg 7.5 mg 7.5 mg 3.75 mg 7.5 mg 7.5 mg     (7.5 mg x 1)  (7.5 mg x 0.5)   (7.5 mg x 1)  (7.5  mg x 1)  (7.5 mg x 0.5)  (7.5 mg x 1)  (7.5 mg x 1)                         Description   DR [id number] PATIENT      INR today: Supratherapeutic  PLAN Weekly dose was decreased by 8% to 45 mg per week  Patient Instructions  Patient educated about medication as defined in this encounter and verbalized understanding by repeating back instructions provided.   Patient advised to contact clinic or seek medical attention if signs/symptoms of bleeding or thromboembolism occur.  Patient verbalized understanding by repeating back information and was advised to contact me if further medication-related questions arise. Patient was also provided an information handout.  Follow-up Return in about 1 week (around 06/11/2017).  [name]

## 2017-06-05 NOTE — Patient Instructions (Signed)
Patient educated about medication as defined in this encounter and verbalized understanding by repeating back instructions provided.   

## 2017-06-06 NOTE — Progress Notes (Signed)
Reviewed thx DrG 

## 2017-06-07 LAB — PROTIME-INR

## 2017-06-08 NOTE — Progress Notes (Signed)
Reviewed thx DrG 

## 2017-06-18 ENCOUNTER — Encounter: Payer: Self-pay | Admitting: Pharmacist

## 2017-06-18 DIAGNOSIS — D689 Coagulation defect, unspecified: Secondary | ICD-10-CM

## 2017-06-18 DIAGNOSIS — Z7901 Long term (current) use of anticoagulants: Secondary | ICD-10-CM

## 2017-06-18 DIAGNOSIS — D6851 Activated protein C resistance: Secondary | ICD-10-CM

## 2017-06-18 DIAGNOSIS — I82629 Acute embolism and thrombosis of deep veins of unspecified upper extremity: Secondary | ICD-10-CM

## 2017-06-18 LAB — POCT INR: INR: 2.5

## 2017-06-18 NOTE — Progress Notes (Signed)
Anticoagulation Management Jessica H Barkeris a 56 y.o.femalewho was contactedfor monitoring of warfarintreatment. Patient has a home INR meter through [facility].  Indication: DVThistory, Factor V Leiden mutation Duration: indefinite Supervising physician: Murriel Hopper  Anticoagulation Clinic Visit History: Patient does notreport signs/symptoms of bleeding or thromboembolism  Anticoagulation Episode Summary    Current INR goal:   2.0-3.0  TTR:   70.9 % (5.2 y)  Next INR check:   07/02/2017  INR from last check:   2.5 (06/18/2017)  Weekly max warfarin dose:     Target end date:   04/11/2015  INR check location:     Preferred lab:     Send INR reminders to:   [facility]   Indications   DVT of upper extremity (deep vein thrombosis) ([place]) [I82.629] Factor V Leiden mutation ([place]) [D68.51] Coagulopathy ([place]) [D68.9] Chronic anticoagulation [Z79.01]       Comments:         Anticoagulation Care Providers    Provider Role Specialty Phone number   Annia Belt, MD Referring Oncology [phone]     Allergies  Allergen Reactions  . Amoxicillin-Pot Clavulanate Rash  . Ceftin Rash  . Cefuroxime Rash  . Cefuroxime Axetil Rash  . Risperidone And Related Other (See Comments)    Has medical contraindications   Medication Sig  acetaminophen (TYLENOL) 500 MG tablet Take 1,000 mg by mouth as needed.  alprazolam (XANAX) 2 MG tablet Take 2 mg by mouth 2 (two) times daily as needed (May take a 1 mg during the day but always takes 2 mg at bedtime.). For anxiety  anastrozole (ARIMIDEX) 1 MG tablet Take 1 mg by mouth daily.    cyanocobalamin (,VITAMIN B-12,) 1000 MCG/ML injection Inject 100 mcg into the muscle every 30 (thirty) days.   docusate sodium (STOOL SOFTENER) 100 MG capsule Take 2 capsules (200 mg total) by mouth at bedtime.  esomeprazole (NEXIUM) 40 MG capsule Take 40 mg by mouth 2 (two) times daily before a meal.  fish oil-omega-3 fatty acids 1000 MG  capsule Take 1 g by mouth 2 (two) times daily.  fluticasone (FLONASE) 50 MCG/ACT nasal spray Place 2 sprays into the nose daily.   levocetirizine (XYZAL) 5 MG tablet TAKE ONE TABLET BY MOUTH AT BEDTIME.  lidocaine (LIDODERM) 5 % Place 1 patch onto the skin as needed. Remove & Discard patch within 12 hours or as directed by MD (only uses prn)  metoprolol (TOPROL-XL) 100 MG 24 hr tablet Take 100 mg by mouth daily.    promethazine (PHENERGAN) 25 MG tablet Take 25 mg by mouth as needed for nausea.   simvastatin (ZOCOR) 20 MG tablet Take 20 mg by mouth daily.   warfarin (COUMADIN) 7.5 MG tablet Take 1 tablet (7.5 mg total) by mouth daily. Adjust per coumadin clinic   Past Medical History:  Diagnosis Date  . Abnormal Pap smear   . Acid reflux   . Bipolar disorder ([place]) 03/22/2013  . Breast cancer ([place])   . Breast disorder    cancer  . Chronic anticoagulation 03/22/2013  . Coagulopathy ([place]) 04/10/2012  . Constipation   . DVT of upper extremity (deep vein thrombosis) ([place]) 04/10/2012  . Factor V Leiden ([place])   . Factor V Leiden mutation ([place]) 04/10/2012  . Family history of ovarian cancer   . Fatty liver 06/04/2017  . Fibroid   . Gallstones   . Heart murmur   . High cholesterol   . History of abnormal cervical Pap smear 01/06/2015  .  History of breast cancer 01/06/2015  . HTN (hypertension)   . HX: breast cancer   . Hypertension 12/31/2012  . Kidney stones   . Kidney stones 06/04/2017  . Lobular carcinoma of breast, estrogen receptor positive, 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  . Mental disorder    panic attacks  . Osteoarthritis   . Pain in both knees   . Vaginal Pap smear, abnormal    Social History   Social History  . Marital status: Divorced    Spouse name: N/A  . Number of children: N/A  . Years of education: N/A   Social History Main Topics  . Smoking status: Never Smoker  . Smokeless tobacco: Never Used  . Alcohol use 0.0 oz/week     Comment: 07-25-2016 per pt rarely  . Drug use: No      Comment: 07-25-2016 Per pt no   . Sexual activity: No     Comment: partial hyst   Other Topics Concern  . Not on file   Social History Narrative  . No narrative on file   Family History  Problem Relation Age of Onset  . Alcohol abuse Mother   . COPD Mother   . Alcohol abuse Father   . Heart disease Father   . Pneumonia Father   . Diabetes Father   . Colon cancer Father        Dx 19s; deceased 70s  . OCD Daughter   . Bipolar disorder Daughter   . Hypertension Daughter   . Schizophrenia Daughter   . Schizophrenia Daughter   . Bipolar disorder Daughter   . OCD Daughter   . Hypertension Daughter   . COPD Daughter   . Other Daughter        stomach don't empty out  . ADD / ADHD Grandchild   . Anxiety disorder Grandchild   . Depression Grandchild   . Healthy Sister   . Cancer Brother        leukemia 79s; deceased 25  . Heart disease Brother   . Cancer Brother        liver; currently 68  . Cirrhosis Brother   . Other Brother        waiting for liver transplant  . Alzheimer's disease Paternal Grandmother   . Ovarian cancer Maternal Grandmother 43       deceased 9  . Cancer Maternal Uncle        3 of 6 with various cancers: skin, NHL, Hodkin's Dx  . Breast cancer Cousin 16       mat first cousin related through unaffected aunt   ASSESSMENT Recent Results: Lab Results  Component Value Date   INR 2.5 06/18/2017   INR 3.4 06/04/2017   INR 3.7 05/28/2017   PROTIME 27.6 (H) 07/30/2013   Anticoagulation Dosing: INR as of 06/18/2017 and Previous Warfarin Dosing Information    INR Dt INR Goal Wkly Tot Sun Mon Tue Wed Thu Fri Sat   06/18/2017 2.5 2.0-3.0 45 mg 7.5 mg 3.75 mg 7.5 mg 7.5 mg 3.75 mg 7.5 mg 7.5 mg    Previous description   DR [name]    Anticoagulation Warfarin Dose Instructions as of 06/18/2017      Total Sun Mon Tue Wed Thu Fri Sat   New Dose 45 mg 7.5 mg 3.75 mg 7.5 mg 7.5 mg 3.75 mg 7.5 mg 7.5 mg     (7.5 mg x 1)  (7.5 mg x 0.5)   (7.5 mg x 1)  (7.5  mg x 1)  (7.5 mg x 0.5)  (7.5 mg x 1)  (7.5 mg x 1)                         Description   DR [id number] PATIENT      INR today: Therapeutic  PLAN Weekly dose was unchanged  Patient Instructions  Patient educated about medication as defined in this encounter and verbalized understanding by repeating back instructions provided.   Patient advised to contact clinic or seek medical attention if signs/symptoms of bleeding or thromboembolism occur.  Patient verbalized understanding by repeating back information and was advised to contact me if further medication-related questions arise. Patient was also provided an information handout.  Follow-up Return in about 2 weeks (around 07/02/2017).  [name]

## 2017-06-18 NOTE — Progress Notes (Signed)
Reviewed thx DrG 

## 2017-06-18 NOTE — Patient Instructions (Signed)
Patient educated about medication as defined in this encounter and verbalized understanding by repeating back instructions provided.   

## 2017-07-05 ENCOUNTER — Telehealth: Payer: Self-pay | Admitting: Adult Health

## 2017-07-05 NOTE — Telephone Encounter (Signed)
Jessica Reilly, says she can't see Dr Laural Golden til February, wants referral to [facility] to GI, will see what their appt schedule is, and get back to her. Told her to try to see Deberah Castle NP

## 2017-07-09 ENCOUNTER — Telehealth: Payer: Self-pay | Admitting: Adult Health

## 2017-07-09 ENCOUNTER — Telehealth: Payer: Self-pay | Admitting: *Deleted

## 2017-07-09 NOTE — Telephone Encounter (Signed)
Call from [place], [place] - pt's INR today is 1.1.

## 2017-07-09 NOTE — Telephone Encounter (Signed)
Pt to let me know about GI at [facility]

## 2017-07-09 NOTE — Telephone Encounter (Signed)
[place] called has appt 12/12 at 11 am with GI at [facility] and needs US showing fatty liver to [phone] phone [phone].

## 2017-07-10 ENCOUNTER — Telehealth (INDEPENDENT_AMBULATORY_CARE_PROVIDER_SITE_OTHER): Payer: Medicare Other | Admitting: Pharmacist

## 2017-07-10 DIAGNOSIS — D689 Coagulation defect, unspecified: Secondary | ICD-10-CM

## 2017-07-10 DIAGNOSIS — I82623 Acute embolism and thrombosis of deep veins of upper extremity, bilateral: Secondary | ICD-10-CM

## 2017-07-10 DIAGNOSIS — I82629 Acute embolism and thrombosis of deep veins of unspecified upper extremity: Secondary | ICD-10-CM

## 2017-07-10 DIAGNOSIS — D6851 Activated protein C resistance: Secondary | ICD-10-CM | POA: Diagnosis not present

## 2017-07-10 DIAGNOSIS — Z86718 Personal history of other venous thrombosis and embolism: Secondary | ICD-10-CM | POA: Diagnosis not present

## 2017-07-10 DIAGNOSIS — I82622 Acute embolism and thrombosis of deep veins of left upper extremity: Secondary | ICD-10-CM

## 2017-07-10 DIAGNOSIS — Z7901 Long term (current) use of anticoagulants: Secondary | ICD-10-CM

## 2017-07-10 LAB — POCT INR: INR: 1

## 2017-07-10 MED ORDER — WARFARIN SODIUM 7.5 MG PO TABS: 7.5000 mg | ORAL_TABLET | Freq: Every day | ORAL | 2 refills | Status: DC

## 2017-07-10 MED ORDER — ENOXAPARIN SODIUM 150 MG/ML ~~LOC~~ SOLN: 150.0000 mg | SUBCUTANEOUS | 0 refills | Status: DC

## 2017-07-10 NOTE — Telephone Encounter (Signed)
Dr Mannie Stabile informed of result.

## 2017-07-10 NOTE — Patient Instructions (Signed)
Patient educated about medication as defined in this encounter and verbalized understanding by repeating back instructions provided.   

## 2017-07-10 NOTE — Progress Notes (Signed)
Anticoagulation Management Jessica Reilly is a 56 y.o. female who checks INR at home. Her result was 0.9 at home. I ordered a lab PT/INR from [facility] and the INR resulted as 1.0 today.   Indication: DVThistory, Factor V Leiden mutation Duration: indefinite Supervising physician: Murriel Hopper  Anticoagulation Clinic Visit History: Patient does notreport signs/symptoms of bleeding or thromboembolism  Anticoagulation Episode Summary    Current INR goal:   2.0-3.0  TTR:   70.4 % (5.2 y)  Next INR check:   07/13/2017  INR from last check:   1.0! (07/10/2017)  Weekly max warfarin dose:     Target end date:   04/11/2015  INR check location:     Preferred lab:     Send INR reminders to:   [facility]   Indications   DVT of upper extremity (deep vein thrombosis) (HCC) [I82.629] Factor V Leiden mutation ([place]) [D68.51] Coagulopathy ([place]) [D68.9] Chronic anticoagulation [Z79.01]       Comments:         Anticoagulation Care Providers    Provider Role Specialty Phone number   Annia Belt, MD Referring Oncology [phone]     Allergies  Allergen Reactions  . Amoxicillin-Pot Clavulanate Rash  . Ceftin Rash  . Cefuroxime Rash  . Cefuroxime Axetil Rash  . Risperidone And Related Other (See Comments)    Has medical contraindications   Medication Sig  acetaminophen (TYLENOL) 500 MG tablet Take 1,000 mg by mouth as needed.  alprazolam (XANAX) 2 MG tablet Take 2 mg by mouth 2 (two) times daily as needed (May take a 1 mg during the day but always takes 2 mg at bedtime.). For anxiety  anastrozole (ARIMIDEX) 1 MG tablet Take 1 mg by mouth daily.    cyanocobalamin (,VITAMIN B-12,) 1000 MCG/ML injection Inject 100 mcg into the muscle every 30 (thirty) days.   docusate sodium (STOOL SOFTENER) 100 MG capsule Take 2 capsules (200 mg total) by mouth at bedtime.  esomeprazole (NEXIUM) 40 MG capsule Take 40 mg by mouth 2 (two) times daily before a meal.  fish  oil-omega-3 fatty acids 1000 MG capsule Take 1 g by mouth 2 (two) times daily.  fluticasone (FLONASE) 50 MCG/ACT nasal spray Place 2 sprays into the nose daily.   levocetirizine (XYZAL) 5 MG tablet TAKE ONE TABLET BY MOUTH AT BEDTIME.  lidocaine (LIDODERM) 5 % Place 1 patch onto the skin as needed. Remove & Discard patch within 12 hours or as directed by MD (only uses prn)  metoprolol (TOPROL-XL) 100 MG 24 hr tablet Take 100 mg by mouth daily.    promethazine (PHENERGAN) 25 MG tablet Take 25 mg by mouth as needed for nausea.   simvastatin (ZOCOR) 20 MG tablet Take 20 mg by mouth daily.   warfarin (COUMADIN) 7.5 MG tablet Take 1 tablet (7.5 mg total) by mouth daily. Adjust per coumadin clinic   Past Medical History:  Diagnosis Date  . Abnormal Pap smear   . Acid reflux   . Bipolar disorder ([place]) 03/22/2013  . Breast cancer ([place])   . Breast disorder    cancer  . Chronic anticoagulation 03/22/2013  . Coagulopathy ([place]) 04/10/2012  . Constipation   . DVT of upper extremity (deep vein thrombosis) ([place]) 04/10/2012  . Factor V Leiden ([place])   . Factor V Leiden mutation ([place]) 04/10/2012  . Family history of ovarian cancer   . Fatty liver 06/04/2017  . Fibroid   . Gallstones   . Heart murmur   .  High cholesterol   . History of abnormal cervical Pap smear 01/06/2015  . History of breast cancer 01/06/2015  . HTN (hypertension)   . HX: breast cancer   . Hypertension 12/31/2012  . Kidney stones   . Kidney stones 06/04/2017  . Lobular carcinoma of breast, estrogen receptor positive, 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  . Mental disorder    panic attacks  . Osteoarthritis   . Pain in both knees   . Vaginal Pap smear, abnormal    Social History   Social History  . Marital status: Divorced    Spouse name: N/A  . Number of children: N/A  . Years of education: N/A   Social History Main Topics  . Smoking status: Never Smoker  . Smokeless tobacco: Never Used  . Alcohol use 0.0 oz/week     Comment: 07-25-2016 per pt  rarely  . Drug use: No     Comment: 07-25-2016 Per pt no   . Sexual activity: No     Comment: partial hyst   Other Topics Concern  . Not on file   Social History Narrative  . No narrative on file   Family History  Problem Relation Age of Onset  . Alcohol abuse Mother   . COPD Mother   . Alcohol abuse Father   . Heart disease Father   . Pneumonia Father   . Diabetes Father   . Colon cancer Father        Dx 80s; deceased 63s  . OCD Daughter   . Bipolar disorder Daughter   . Hypertension Daughter   . Schizophrenia Daughter   . Schizophrenia Daughter   . Bipolar disorder Daughter   . OCD Daughter   . Hypertension Daughter   . COPD Daughter   . Other Daughter        stomach don't empty out  . ADD / ADHD Grandchild   . Anxiety disorder Grandchild   . Depression Grandchild   . Healthy Sister   . Cancer Brother        leukemia 26s; deceased 64  . Heart disease Brother   . Cancer Brother        liver; currently 52  . Cirrhosis Brother   . Other Brother        waiting for liver transplant  . Alzheimer's disease Paternal Grandmother   . Ovarian cancer Maternal Grandmother 42       deceased 29  . Cancer Maternal Uncle        3 of 6 with various cancers: skin, NHL, Hodkin's Dx  . Breast cancer Cousin 85       mat first cousin related through unaffected aunt   ASSESSMENT Recent Results: The most recent result is correlated with 7 mg per week: Lab Results  Component Value Date   INR 1.0 07/10/2017   INR 2.5 06/18/2017   INR 3.4 06/04/2017   PROTIME 27.6 (H) 07/30/2013   Anticoagulation Dosing: INR as of 07/10/2017 and Previous Warfarin Dosing Information    INR Dt INR Goal Wkly Tot Sun Mon Tue Wed Thu Fri Sat   07/10/2017 1.0 2.0-3.0 7 mg 1 mg 1 mg 1 mg 1 mg 1 mg 1 mg 1 mg   Patient deviated from recommended dosing.       Previous description   DR [id number] PATIENT    Anticoagulation Warfarin Dose Instructions as of 07/10/2017      Total Sun Mon Tue Wed Thu  Fri Sat   New Dose 45  mg 7.5 mg 3.75 mg 7.5 mg 7.5 mg 3.75 mg 7.5 mg 7.5 mg     (7.5 mg x 1)  (7.5 mg x 0.5)  (7.5 mg x 1)  (7.5 mg x 1)  (7.5 mg x 0.5)  (7.5 mg x 1)  (7.5 mg x 1)                         Description   DR [id number] PATIENT Patient accidentally took from an old bottle 1 mg daily     INR today: Subtherapeutic  PLAN Patient was advised to start enoxaparin 1.5 mg/kg daily until INR therapeutic, resume previous warfarin dose, re-check INR Friday.  Patient Instructions  Patient educated about medication as defined in this encounter and verbalized understanding by repeating back instructions provided.   Patient advised to contact clinic or seek medical attention if signs/symptoms of bleeding or thromboembolism occur.  Patient verbalized understanding by repeating back information and was advised to contact me if further medication-related questions arise. Patient was also provided an information handout.  Follow-up Return in about 3 days (around 07/13/2017).  [name]

## 2017-07-12 LAB — PROTIME-INR

## 2017-07-17 ENCOUNTER — Encounter: Payer: Self-pay | Admitting: Pharmacist

## 2017-07-17 DIAGNOSIS — D6851 Activated protein C resistance: Secondary | ICD-10-CM

## 2017-07-17 DIAGNOSIS — Z7901 Long term (current) use of anticoagulants: Secondary | ICD-10-CM

## 2017-07-17 DIAGNOSIS — I82629 Acute embolism and thrombosis of deep veins of unspecified upper extremity: Secondary | ICD-10-CM

## 2017-07-17 DIAGNOSIS — D689 Coagulation defect, unspecified: Secondary | ICD-10-CM

## 2017-07-17 LAB — POCT INR: INR: 1.9

## 2017-07-17 NOTE — Progress Notes (Signed)
Reviewed thx DrG 

## 2017-07-17 NOTE — Progress Notes (Signed)
Anticoagulation Management Jessica H Barkeris a 56 y.o.femalewho was contactedfor monitoring of warfarintreatment. Patient has a home INR meter through [facility].  Indication: DVThistory, Factor V Leiden mutation Duration: indefinite Supervising physician: Murriel Hopper  Anticoagulation Episode Summary    Current INR goal:   2.0-3.0  TTR:   70.2 % (5.2 y)  Next INR check:   07/23/2017  INR from last check:     Weekly max warfarin dose:     Target end date:   04/11/2015  INR check location:     Preferred lab:     Send INR reminders to:   [facility]   Indications   DVT of upper extremity (deep vein thrombosis) (HCC) [I82.629] Factor V Leiden mutation ([place]) [D68.51] Coagulopathy ([place]) [D68.9] Chronic anticoagulation [Z79.01]       Comments:         Anticoagulation Care Providers    Provider Role Specialty Phone number   Annia Belt, MD Referring Oncology [phone]     Allergies  Allergen Reactions  . Amoxicillin-Pot Clavulanate Rash  . Ceftin Rash  . Cefuroxime Rash  . Cefuroxime Axetil Rash  . Risperidone And Related Other (See Comments)    Has medical contraindications   Medication Sig  acetaminophen (TYLENOL) 500 MG tablet Take 1,000 mg by mouth as needed.  alprazolam (XANAX) 2 MG tablet Take 2 mg by mouth 2 (two) times daily as needed (May take a 1 mg during the day but always takes 2 mg at bedtime.). For anxiety  anastrozole (ARIMIDEX) 1 MG tablet Take 1 mg by mouth daily.    cyanocobalamin (,VITAMIN B-12,) 1000 MCG/ML injection Inject 100 mcg into the muscle every 30 (thirty) days.   docusate sodium (STOOL SOFTENER) 100 MG capsule Take 2 capsules (200 mg total) by mouth at bedtime.  enoxaparin (LOVENOX) 150 MG/ML injection Inject 1 mL (150 mg total) into the skin daily.  esomeprazole (NEXIUM) 40 MG capsule Take 40 mg by mouth 2 (two) times daily before a meal.  fish oil-omega-3 fatty acids 1000 MG capsule Take 1 g by mouth 2 (two) times  daily.  fluticasone (FLONASE) 50 MCG/ACT nasal spray Place 2 sprays into the nose daily.   levocetirizine (XYZAL) 5 MG tablet TAKE ONE TABLET BY MOUTH AT BEDTIME.  lidocaine (LIDODERM) 5 % Place 1 patch onto the skin as needed. Remove & Discard patch within 12 hours or as directed by MD (only uses prn)  metoprolol (TOPROL-XL) 100 MG 24 hr tablet Take 100 mg by mouth daily.    promethazine (PHENERGAN) 25 MG tablet Take 25 mg by mouth as needed for nausea.   simvastatin (ZOCOR) 20 MG tablet Take 20 mg by mouth daily.   warfarin (COUMADIN) 7.5 MG tablet Take 1 tablet (7.5 mg total) by mouth daily. Adjust per coumadin clinic   Past Medical History:  Diagnosis Date  . Abnormal Pap smear   . Acid reflux   . Bipolar disorder ([place]) 03/22/2013  . Breast cancer ([place])   . Breast disorder    cancer  . Chronic anticoagulation 03/22/2013  . Coagulopathy ([place]) 04/10/2012  . Constipation   . DVT of upper extremity (deep vein thrombosis) ([place]) 04/10/2012  . Factor V Leiden ([place])   . Factor V Leiden mutation ([place]) 04/10/2012  . Family history of ovarian cancer   . Fatty liver 06/04/2017  . Fibroid   . Gallstones   . Heart murmur   . High cholesterol   . History of abnormal cervical Pap smear  01/06/2015  . History of breast cancer 01/06/2015  . HTN (hypertension)   . HX: breast cancer   . Hypertension 12/31/2012  . Kidney stones   . Kidney stones 06/04/2017  . Lobular carcinoma of breast, estrogen receptor positive, 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  . Mental disorder    panic attacks  . Osteoarthritis   . Pain in both knees   . Vaginal Pap smear, abnormal    Social History   Social History  . Marital status: Divorced    Spouse name: N/A  . Number of children: N/A  . Years of education: N/A   Social History Main Topics  . Smoking status: Never Smoker  . Smokeless tobacco: Never Used  . Alcohol use 0.0 oz/week     Comment: 07-25-2016 per pt rarely  . Drug use: No     Comment: 07-25-2016 Per pt no   . Sexual  activity: No     Comment: partial hyst   Other Topics Concern  . Not on file   Social History Narrative  . No narrative on file   Family History  Problem Relation Age of Onset  . Alcohol abuse Mother   . COPD Mother   . Alcohol abuse Father   . Heart disease Father   . Pneumonia Father   . Diabetes Father   . Colon cancer Father        Dx 28s; deceased 50s  . OCD Daughter   . Bipolar disorder Daughter   . Hypertension Daughter   . Schizophrenia Daughter   . Schizophrenia Daughter   . Bipolar disorder Daughter   . OCD Daughter   . Hypertension Daughter   . COPD Daughter   . Other Daughter        stomach don't empty out  . ADD / ADHD Grandchild   . Anxiety disorder Grandchild   . Depression Grandchild   . Healthy Sister   . Cancer Brother        leukemia 81s; deceased 31  . Heart disease Brother   . Cancer Brother        liver; currently 41  . Cirrhosis Brother   . Other Brother        waiting for liver transplant  . Alzheimer's disease Paternal Grandmother   . Ovarian cancer Maternal Grandmother 11       deceased 70  . Cancer Maternal Uncle        3 of 6 with various cancers: skin, NHL, Hodkin's Dx  . Breast cancer Cousin 27       mat first cousin related through unaffected aunt   ASSESSMENT Recent Results: Lab Results  Component Value Date   INR 1.9 07/17/2017   INR 1.0 07/10/2017   INR 2.5 06/18/2017   PROTIME 27.6 (H) 07/30/2013   Anticoagulation Dosing: INR as of 07/17/2017 and Previous Warfarin Dosing Information    INR Dt INR Goal Wkly Tot Sun Mon Tue Wed Thu Fri Sat     2.0-3.0 45 mg 7.5 mg 3.75 mg 7.5 mg 7.5 mg 3.75 mg 7.5 mg 7.5 mg    Previous description   DR [name] Patient accidentally took from an old bottle 1 mg daily   Anticoagulation Warfarin Dose Instructions as of 07/17/2017      Total Sun Mon Tue Wed Thu Fri Sat   New Dose 45 mg 7.5 mg 3.75 mg 7.5 mg 7.5 mg 3.75 mg 7.5 mg 7.5 mg     (7.5 mg x 1)  (  7.5 mg x 0.5)   (7.5 mg x 1)  (7.5 mg x 1)  (7.5 mg x 0.5)  (7.5 mg x 1)  (7.5 mg x 1)                         Description   DR [name] PATIENT     INR today: slightly Subtherapeutic  PLAN Weekly dose was unchanged, patient due for full tablet dose today rather than half tablet, follow up in 1 week  Patient Instructions  Patient educated about medication as defined in this encounter and verbalized understanding by repeating back instructions provided.   Patient advised to contact clinic or seek medical attention if signs/symptoms of bleeding or thromboembolism occur.  Patient verbalized understanding by repeating back information and was advised to contact me if further medication-related questions arise. Patient was also provided an information handout.  Follow-up Return in about 1 week (around 07/24/2017).  Jessica Reilly

## 2017-07-17 NOTE — Patient Instructions (Signed)
Patient educated about medication as defined in this encounter and verbalized understanding by repeating back instructions provided.   

## 2017-08-02 ENCOUNTER — Encounter: Payer: Self-pay | Admitting: Pharmacist

## 2017-08-02 DIAGNOSIS — D6851 Activated protein C resistance: Secondary | ICD-10-CM | POA: Diagnosis not present

## 2017-08-02 DIAGNOSIS — Z7901 Long term (current) use of anticoagulants: Secondary | ICD-10-CM

## 2017-08-02 DIAGNOSIS — I82629 Acute embolism and thrombosis of deep veins of unspecified upper extremity: Secondary | ICD-10-CM

## 2017-08-02 DIAGNOSIS — D689 Coagulation defect, unspecified: Secondary | ICD-10-CM

## 2017-08-02 LAB — POCT INR: INR: 2.7

## 2017-08-02 NOTE — Progress Notes (Signed)
Anticoagulation Management Jessica H Barkeris a 56 y.o.femalewho was contactedfor monitoring of warfarintreatment. Patient has a home INR meter through [facility].  Indication: DVThistory, Factor V Leiden mutation Duration: indefinite Supervising physician: Murriel Hopper  Anticoagulation Episode Summary    Current INR goal:   2.0-3.0  TTR:   70.3 % (5.3 y)  Next INR check:   08/16/2017  INR from last check:   2.7 (08/02/2017)  Weekly max warfarin dose:     Target end date:   04/11/2015  INR check location:     Preferred lab:     Send INR reminders to:   [facility]   Indications   DVT of upper extremity (deep vein thrombosis) (HCC) [I82.629] Factor V Leiden mutation ([place]) [D68.51] Coagulopathy ([place]) [D68.9] Chronic anticoagulation [Z79.01]       Comments:         Anticoagulation Care Providers    Provider Role Specialty Phone number   Annia Belt, MD Referring Oncology [phone]      Allergies  Allergen Reactions  . Amoxicillin-Pot Clavulanate Rash  . Ceftin Rash  . Cefuroxime Rash  . Cefuroxime Axetil Rash  . Risperidone And Related Other (See Comments)    Has medical contraindications   Medication Sig  acetaminophen (TYLENOL) 500 MG tablet Take 1,000 mg by mouth as needed.  alprazolam (XANAX) 2 MG tablet Take 2 mg by mouth 2 (two) times daily as needed (May take a 1 mg during the day but always takes 2 mg at bedtime.). For anxiety  anastrozole (ARIMIDEX) 1 MG tablet Take 1 mg by mouth daily.    cyanocobalamin (,VITAMIN B-12,) 1000 MCG/ML injection Inject 100 mcg into the muscle every 30 (thirty) days.   docusate sodium (STOOL SOFTENER) 100 MG capsule Take 2 capsules (200 mg total) by mouth at bedtime.  enoxaparin (LOVENOX) 150 MG/ML injection Inject 1 mL (150 mg total) into the skin daily.  esomeprazole (NEXIUM) 40 MG capsule Take 40 mg by mouth 2 (two) times daily before a meal.  fish oil-omega-3 fatty acids 1000 MG capsule Take 1 g by  mouth 2 (two) times daily.  fluticasone (FLONASE) 50 MCG/ACT nasal spray Place 2 sprays into the nose daily.   levocetirizine (XYZAL) 5 MG tablet TAKE ONE TABLET BY MOUTH AT BEDTIME.  lidocaine (LIDODERM) 5 % Place 1 patch onto the skin as needed. Remove & Discard patch within 12 hours or as directed by MD (only uses prn)  metoprolol (TOPROL-XL) 100 MG 24 hr tablet Take 100 mg by mouth daily.    promethazine (PHENERGAN) 25 MG tablet Take 25 mg by mouth as needed for nausea.   simvastatin (ZOCOR) 20 MG tablet Take 20 mg by mouth daily.   warfarin (COUMADIN) 7.5 MG tablet Take 1 tablet (7.5 mg total) by mouth daily. Adjust per coumadin clinic   Past Medical History:  Diagnosis Date  . Abnormal Pap smear   . Acid reflux   . Bipolar disorder ([place]) 03/22/2013  . Breast cancer ([place])   . Breast disorder    cancer  . Chronic anticoagulation 03/22/2013  . Coagulopathy ([place]) 04/10/2012  . Constipation   . DVT of upper extremity (deep vein thrombosis) ([place]) 04/10/2012  . Factor V Leiden ([place])   . Factor V Leiden mutation ([place]) 04/10/2012  . Family history of ovarian cancer   . Fatty liver 06/04/2017  . Fibroid   . Gallstones   . Heart murmur   . High cholesterol   . History of abnormal cervical  Pap smear 01/06/2015  . History of breast cancer 01/06/2015  . HTN (hypertension)   . HX: breast cancer   . Hypertension 12/31/2012  . Kidney stones   . Kidney stones 06/04/2017  . Lobular carcinoma of breast, estrogen receptor positive, 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  . Mental disorder    panic attacks  . Osteoarthritis   . Pain in both knees   . Vaginal Pap smear, abnormal    Social History   Social History  . Marital status: Divorced    Spouse name: N/A  . Number of children: N/A  . Years of education: N/A   Social History Main Topics  . Smoking status: Never Smoker  . Smokeless tobacco: Never Used  . Alcohol use 0.0 oz/week     Comment: 07-25-2016 per pt rarely  . Drug use: No     Comment: 07-25-2016  Per pt no   . Sexual activity: No     Comment: partial hyst   Other Topics Concern  . Not on file   Social History Narrative  . No narrative on file   Family History  Problem Relation Age of Onset  . Alcohol abuse Mother   . COPD Mother   . Alcohol abuse Father   . Heart disease Father   . Pneumonia Father   . Diabetes Father   . Colon cancer Father        Dx 74s; deceased 47s  . OCD Daughter   . Bipolar disorder Daughter   . Hypertension Daughter   . Schizophrenia Daughter   . Schizophrenia Daughter   . Bipolar disorder Daughter   . OCD Daughter   . Hypertension Daughter   . COPD Daughter   . Other Daughter        stomach don't empty out  . ADD / ADHD Grandchild   . Anxiety disorder Grandchild   . Depression Grandchild   . Healthy Sister   . Cancer Brother        leukemia 52s; deceased 68  . Heart disease Brother   . Cancer Brother        liver; currently 67  . Cirrhosis Brother   . Other Brother        waiting for liver transplant  . Alzheimer's disease Paternal Grandmother   . Ovarian cancer Maternal Grandmother 48       deceased 14  . Cancer Maternal Uncle        3 of 6 with various cancers: skin, NHL, Hodkin's Dx  . Breast cancer Cousin 23       mat first cousin related through unaffected aunt   ASSESSMENT Recent Results: Lab Results  Component Value Date   INR 2.7 08/02/2017   INR 1.9 07/17/2017   INR 1.0 07/10/2017   PROTIME 27.6 (H) 07/30/2013   Anticoagulation Dosing: INR as of 08/02/2017 and Previous Warfarin Dosing Information    INR Dt INR Goal [name] Sun Mon Tue Wed Thu Fri Sat   08/02/2017 2.7 2.0-3.0 45 mg 7.5 mg 3.75 mg 7.5 mg 7.5 mg 3.75 mg 7.5 mg 7.5 mg    Previous description   DR [name]   Anticoagulation Warfarin Dose Instructions as of 08/02/2017      Total Sun Mon Tue Wed Thu Fri Sat   New Dose 45 mg 7.5 mg 3.75 mg 7.5 mg 7.5 mg 3.75 mg 7.5 mg 7.5 mg     (7.5 mg x 1)  (7.5 mg x 0.5)  (7.5 mg x  1)  (7.5 mg  x 1)  (7.5 mg x 0.5)  (7.5 mg x 1)  (7.5 mg x 1)                         Description   DR [name]     INR today: Therapeutic  PLAN Weekly dose was unchanged  Patient Instructions  Patient educated about medication as defined in this encounter and verbalized understanding by repeating back instructions provided.   Patient advised to contact clinic or seek medical attention if signs/symptoms of bleeding or thromboembolism occur.  Patient verbalized understanding by repeating back information and was advised to contact me if further medication-related questions arise. Patient was also provided an information handout.  Follow-up Return in about 2 weeks (around 08/16/2017).  [name]

## 2017-08-02 NOTE — Patient Instructions (Signed)
Patient educated about medication as defined in this encounter and verbalized understanding by repeating back instructions provided.   

## 2017-08-09 DIAGNOSIS — Z1389 Encounter for screening for other disorder: Secondary | ICD-10-CM | POA: Diagnosis not present

## 2017-08-09 DIAGNOSIS — E538 Deficiency of other specified B group vitamins: Secondary | ICD-10-CM | POA: Diagnosis not present

## 2017-08-09 DIAGNOSIS — J019 Acute sinusitis, unspecified: Secondary | ICD-10-CM | POA: Diagnosis not present

## 2017-08-15 LAB — PROTIME-INR

## 2017-08-28 ENCOUNTER — Ambulatory Visit (INDEPENDENT_AMBULATORY_CARE_PROVIDER_SITE_OTHER): Payer: Medicare Other | Admitting: Pharmacist

## 2017-08-28 DIAGNOSIS — Z7901 Long term (current) use of anticoagulants: Secondary | ICD-10-CM

## 2017-08-28 DIAGNOSIS — D6851 Activated protein C resistance: Secondary | ICD-10-CM

## 2017-08-28 DIAGNOSIS — I82629 Acute embolism and thrombosis of deep veins of unspecified upper extremity: Secondary | ICD-10-CM | POA: Diagnosis not present

## 2017-08-28 DIAGNOSIS — D689 Coagulation defect, unspecified: Secondary | ICD-10-CM

## 2017-08-28 LAB — POCT INR: INR: 2.6

## 2017-08-28 NOTE — Patient Instructions (Signed)
Patient educated about medication as defined in this encounter and verbalized understanding by repeating back instructions provided.   

## 2017-08-28 NOTE — Progress Notes (Signed)
Reviewed thx DrG 

## 2017-08-28 NOTE — Progress Notes (Signed)
Anticoagulation Management Jessica H Barkeris a 56 y.o.femalewho was contactedfor monitoring of warfarintreatment. Patient has a home INR meter through [facility].  Indication: DVThistory, Factor V Leiden mutation Duration: indefinite Supervising physician: Murriel Hopper  Anticoagulation Episode Summary    Current INR goal:   2.0-3.0  TTR:   70.7 % (5.4 y)  Next INR check:   09/11/2017  INR from last check:   2.6 (08/28/2017)  Weekly max warfarin dose:     Target end date:   04/11/2015  INR check location:     Preferred lab:     Send INR reminders to:   [place]   Indications   DVT of upper extremity (deep vein thrombosis) (HCC) [I82.629] Factor V Leiden mutation ([place]) [D68.51] Coagulopathy ([place]) [D68.9] Chronic anticoagulation [Z79.01]       Comments:         Anticoagulation Care Providers    Provider Role Specialty Phone number   Annia Belt, MD Referring Oncology [phone]     Allergies  Allergen Reactions  . Amoxicillin-Pot Clavulanate Rash  . Ceftin Rash  . Cefuroxime Rash  . Cefuroxime Axetil Rash  . Risperidone And Related Other (See Comments)    Has medical contraindications   Medication Sig  acetaminophen (TYLENOL) 500 MG tablet Take 1,000 mg by mouth as needed.  alprazolam (XANAX) 2 MG tablet Take 2 mg by mouth 2 (two) times daily as needed (May take a 1 mg during the day but always takes 2 mg at bedtime.). For anxiety  anastrozole (ARIMIDEX) 1 MG tablet Take 1 mg by mouth daily.    cyanocobalamin (,VITAMIN B-12,) 1000 MCG/ML injection Inject 100 mcg into the muscle every 30 (thirty) days.   docusate sodium (STOOL SOFTENER) 100 MG capsule Take 2 capsules (200 mg total) by mouth at bedtime.  enoxaparin (LOVENOX) 150 MG/ML injection Inject 1 mL (150 mg total) into the skin daily.  esomeprazole (NEXIUM) 40 MG capsule Take 40 mg by mouth 2 (two) times daily before a meal.  fish oil-omega-3 fatty acids 1000 MG capsule Take 1 g by mouth  2 (two) times daily.  fluticasone (FLONASE) 50 MCG/ACT nasal spray Place 2 sprays into the nose daily.   levocetirizine (XYZAL) 5 MG tablet TAKE ONE TABLET BY MOUTH AT BEDTIME.  lidocaine (LIDODERM) 5 % Place 1 patch onto the skin as needed. Remove & Discard patch within 12 hours or as directed by MD (only uses prn)  metoprolol (TOPROL-XL) 100 MG 24 hr tablet Take 100 mg by mouth daily.    promethazine (PHENERGAN) 25 MG tablet Take 25 mg by mouth as needed for nausea.   simvastatin (ZOCOR) 20 MG tablet Take 20 mg by mouth daily.   warfarin (COUMADIN) 7.5 MG tablet Take 1 tablet (7.5 mg total) by mouth daily. Adjust per coumadin clinic   Past Medical History:  Diagnosis Date  . Abnormal Pap smear   . Acid reflux   . Bipolar disorder ([place]) 03/22/2013  . Breast cancer ([place])   . Breast disorder    cancer  . Chronic anticoagulation 03/22/2013  . Coagulopathy ([place]) 04/10/2012  . Constipation   . DVT of upper extremity (deep vein thrombosis) ([place]) 04/10/2012  . Factor V Leiden ([place])   . Factor V Leiden mutation ([place]) 04/10/2012  . Family history of ovarian cancer   . Fatty liver 06/04/2017  . Fibroid   . Gallstones   . Heart murmur   . High cholesterol   . History of abnormal cervical Pap  smear 01/06/2015  . History of breast cancer 01/06/2015  . HTN (hypertension)   . HX: breast cancer   . Hypertension 12/31/2012  . Kidney stones   . Kidney stones 06/04/2017  . Lobular carcinoma of breast, estrogen receptor positive, 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  . Mental disorder    panic attacks  . Osteoarthritis   . Pain in both knees   . Vaginal Pap smear, abnormal    Social History   Socioeconomic History  . Marital status: Divorced    Spouse name: Not on file  . Number of children: Not on file  . Years of education: Not on file  . Highest education level: Not on file  Social Needs  . Financial resource strain: Not on file  . Food insecurity - worry: Not on file  . Food insecurity - inability: Not on file   . Transportation needs - medical: Not on file  . Transportation needs - non-medical: Not on file  Occupational History  . Not on file  Tobacco Use  . Smoking status: Never Smoker  . Smokeless tobacco: Never Used  Substance and Sexual Activity  . Alcohol use: Yes    Alcohol/week: 0.0 oz    Comment: 07-25-2016 per pt rarely  . Drug use: No    Comment: 07-25-2016 Per pt no   . Sexual activity: No    Birth control/protection: Surgical    Comment: partial hyst  Other Topics Concern  . Not on file  Social History Narrative  . Not on file   Family History  Problem Relation Age of Onset  . Alcohol abuse Mother   . COPD Mother   . Alcohol abuse Father   . Heart disease Father   . Pneumonia Father   . Diabetes Father   . Colon cancer Father        Dx 27s; deceased 41s  . OCD Daughter   . Bipolar disorder Daughter   . Hypertension Daughter   . Schizophrenia Daughter   . Schizophrenia Daughter   . Bipolar disorder Daughter   . OCD Daughter   . Hypertension Daughter   . COPD Daughter   . Other Daughter        stomach don't empty out  . ADD / ADHD Grandchild   . Anxiety disorder Grandchild   . Depression Grandchild   . Healthy Sister   . Cancer Brother        leukemia 39s; deceased 13  . Heart disease Brother   . Cancer Brother        liver; currently 58  . Cirrhosis Brother   . Other Brother        waiting for liver transplant  . Alzheimer's disease Paternal Grandmother   . Ovarian cancer Maternal Grandmother 53       deceased 53  . Cancer Maternal Uncle        3 of 6 with various cancers: skin, NHL, Hodkin's Dx  . Breast cancer Cousin 30       mat first cousin related through unaffected aunt    ASSESSMENT Recent Results: Lab Results  Component Value Date   INR 2.6 08/28/2017   INR 2.7 08/02/2017   INR 1.9 07/17/2017   PROTIME 27.6 (H) 07/30/2013   Anticoagulation Dosing: Description   DR [name]     INR today:  Therapeutic  PLAN Weekly dose was unchanged  Patient Instructions  Patient educated about medication as defined in this encounter and verbalized understanding by repeating back  instructions provided.    Patient advised to contact clinic or seek medical attention if signs/symptoms of bleeding or thromboembolism occur.  Patient verbalized understanding by repeating back information and was advised to contact me if further medication-related questions arise. Patient was also provided an information handout.  Follow-up Return in about 2 weeks (around 09/11/2017).  [name]

## 2017-09-06 DIAGNOSIS — M87021 Idiopathic aseptic necrosis of right humerus: Secondary | ICD-10-CM | POA: Diagnosis not present

## 2017-09-06 DIAGNOSIS — Z881 Allergy status to other antibiotic agents status: Secondary | ICD-10-CM | POA: Diagnosis not present

## 2017-09-06 DIAGNOSIS — Z79899 Other long term (current) drug therapy: Secondary | ICD-10-CM | POA: Diagnosis not present

## 2017-09-06 DIAGNOSIS — J45909 Unspecified asthma, uncomplicated: Secondary | ICD-10-CM | POA: Diagnosis not present

## 2017-09-06 DIAGNOSIS — Z79811 Long term (current) use of aromatase inhibitors: Secondary | ICD-10-CM | POA: Diagnosis not present

## 2017-09-06 DIAGNOSIS — Z888 Allergy status to other drugs, medicaments and biological substances status: Secondary | ICD-10-CM | POA: Diagnosis not present

## 2017-09-06 DIAGNOSIS — M75121 Complete rotator cuff tear or rupture of right shoulder, not specified as traumatic: Secondary | ICD-10-CM | POA: Diagnosis not present

## 2017-09-06 DIAGNOSIS — I10 Essential (primary) hypertension: Secondary | ICD-10-CM | POA: Diagnosis not present

## 2017-09-06 DIAGNOSIS — C50911 Malignant neoplasm of unspecified site of right female breast: Secondary | ICD-10-CM | POA: Diagnosis not present

## 2017-09-06 DIAGNOSIS — Z86718 Personal history of other venous thrombosis and embolism: Secondary | ICD-10-CM | POA: Diagnosis not present

## 2017-09-06 DIAGNOSIS — Z7901 Long term (current) use of anticoagulants: Secondary | ICD-10-CM | POA: Diagnosis not present

## 2017-09-06 DIAGNOSIS — M19012 Primary osteoarthritis, left shoulder: Secondary | ICD-10-CM | POA: Diagnosis not present

## 2017-09-06 DIAGNOSIS — Z170 Estrogen receptor positive status [ER+]: Secondary | ICD-10-CM | POA: Diagnosis not present

## 2017-09-06 DIAGNOSIS — Z9889 Other specified postprocedural states: Secondary | ICD-10-CM | POA: Diagnosis not present

## 2017-09-06 DIAGNOSIS — M19011 Primary osteoarthritis, right shoulder: Secondary | ICD-10-CM | POA: Diagnosis not present

## 2017-09-06 DIAGNOSIS — Z880 Allergy status to penicillin: Secondary | ICD-10-CM | POA: Diagnosis not present

## 2017-09-06 DIAGNOSIS — C50919 Malignant neoplasm of unspecified site of unspecified female breast: Secondary | ICD-10-CM | POA: Diagnosis not present

## 2017-09-07 DIAGNOSIS — R7309 Other abnormal glucose: Secondary | ICD-10-CM | POA: Diagnosis not present

## 2017-09-14 ENCOUNTER — Other Ambulatory Visit: Payer: Self-pay | Admitting: Oncology

## 2017-09-14 DIAGNOSIS — I82623 Acute embolism and thrombosis of deep veins of upper extremity, bilateral: Secondary | ICD-10-CM

## 2017-09-14 DIAGNOSIS — I82622 Acute embolism and thrombosis of deep veins of left upper extremity: Secondary | ICD-10-CM

## 2017-09-14 DIAGNOSIS — E7800 Pure hypercholesterolemia, unspecified: Secondary | ICD-10-CM

## 2017-09-20 ENCOUNTER — Encounter: Payer: Self-pay | Admitting: Pharmacist

## 2017-09-20 DIAGNOSIS — D6851 Activated protein C resistance: Secondary | ICD-10-CM

## 2017-09-20 DIAGNOSIS — Z7901 Long term (current) use of anticoagulants: Secondary | ICD-10-CM

## 2017-09-20 DIAGNOSIS — I82629 Acute embolism and thrombosis of deep veins of unspecified upper extremity: Secondary | ICD-10-CM

## 2017-09-20 DIAGNOSIS — D689 Coagulation defect, unspecified: Secondary | ICD-10-CM

## 2017-09-20 LAB — POCT INR: INR: 2.9

## 2017-09-27 NOTE — Progress Notes (Signed)
Anticoagulation Management Jessica Reilly a 56 y.o.femalewho was contactedfor monitoring of warfarintreatment. Patient has a home INR meter through [facility].  Indication: DVThistory, Factor V Leiden mutation Duration: indefinite Supervising physician: Jessica Reilly  Patient reports no sign/symptoms of bleeding or thromboembolism, no medication, diet, or other changes to report  Anticoagulation Episode Summary    Current INR goal:   2.0-3.0  TTR:   71.1 % (5.4 y)  Next INR check:   10/04/2017  INR from last check:   2.9 (09/20/2017)  Weekly max warfarin dose:     Target end date:   04/11/2015  INR check location:     Preferred lab:     Send INR reminders to:   [facility]   Indications   DVT of upper extremity (deep vein thrombosis) ([place]) [I82.629] Factor V Leiden mutation ([place]) [D68.51] Coagulopathy ([place]) [D68.9] Chronic anticoagulation [Z79.01]       Comments:         Anticoagulation Care Providers    Provider Role Specialty Phone number   Jessica Belt, MD Referring Oncology [phone]      Allergies  Allergen Reactions  . Amoxicillin-Pot Clavulanate Rash  . Ceftin Rash  . Cefuroxime Rash  . Cefuroxime Axetil Rash  . Risperidone And Related Other (See Comments)    Has medical contraindications   Medication Sig  acetaminophen (TYLENOL) 500 MG tablet Take 1,000 mg by mouth as needed.  alprazolam (XANAX) 2 MG tablet Take 2 mg by mouth 2 (two) times daily as needed (May take a 1 mg during the day but always takes 2 mg at bedtime.). For anxiety  anastrozole (ARIMIDEX) 1 MG tablet Take 1 mg by mouth daily.    cyanocobalamin (,VITAMIN B-12,) 1000 MCG/ML injection Inject 100 mcg into the muscle every 30 (thirty) days.   docusate sodium (STOOL SOFTENER) 100 MG capsule Take 2 capsules (200 mg total) by mouth at bedtime.  enoxaparin (LOVENOX) 150 MG/ML injection Inject 1 mL (150 mg total) into the skin daily.  esomeprazole (NEXIUM) 40 MG capsule  Take 40 mg by mouth 2 (two) times daily before a meal.  fish oil-omega-3 fatty acids 1000 MG capsule Take 1 g by mouth 2 (two) times daily.  fluticasone (FLONASE) 50 MCG/ACT nasal spray Place 2 sprays into the nose daily.   levocetirizine (XYZAL) 5 MG tablet TAKE ONE TABLET BY MOUTH AT BEDTIME.  lidocaine (LIDODERM) 5 % Place 1 patch onto the skin as needed. Remove & Discard patch within 12 hours or as directed by MD (only uses prn)  metoprolol (TOPROL-XL) 100 MG 24 hr tablet Take 100 mg by mouth daily.    promethazine (PHENERGAN) 25 MG tablet Take 25 mg by mouth as needed for nausea.   simvastatin (ZOCOR) 20 MG tablet Take 20 mg by mouth daily.   warfarin (COUMADIN) 7.5 MG tablet TAKE 1 TABLET BY MOUTH DAILY.   Past Medical History:  Diagnosis Date  . Abnormal Pap smear   . Acid reflux   . Bipolar disorder ([place]) 03/22/2013  . Breast cancer ([place])   . Breast disorder    cancer  . Chronic anticoagulation 03/22/2013  . Coagulopathy ([place]) 04/10/2012  . Constipation   . DVT of upper extremity (deep vein thrombosis) ([place]) 04/10/2012  . Factor V Leiden ([place])   . Factor V Leiden mutation ([place]) 04/10/2012  . Family history of ovarian cancer   . Fatty liver 06/04/2017  . Fibroid   . Gallstones   . Heart murmur   .  High cholesterol   . History of abnormal cervical Pap smear 01/06/2015  . History of breast cancer 01/06/2015  . HTN (hypertension)   . HX: breast cancer   . Hypertension 12/31/2012  . Kidney stones   . Kidney stones 06/04/2017  . Lobular carcinoma of breast, estrogen receptor positive, 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  . Mental disorder    panic attacks  . Osteoarthritis   . Pain in both knees   . Vaginal Pap smear, abnormal    Social History   Socioeconomic History  . Marital status: Divorced    Spouse name: Not on file  . Number of children: Not on file  . Years of education: Not on file  . Highest education level: Not on file  Social Needs  . Financial resource strain: Not on file  .  Food insecurity - worry: Not on file  . Food insecurity - inability: Not on file  . Transportation needs - medical: Not on file  . Transportation needs - non-medical: Not on file  Occupational History  . Not on file  Tobacco Use  . Smoking status: Never Smoker  . Smokeless tobacco: Never Used  Substance and Sexual Activity  . Alcohol use: Yes    Alcohol/week: 0.0 oz    Comment: 07-25-2016 per pt rarely  . Drug use: No    Comment: 07-25-2016 Per pt no   . Sexual activity: No    Birth control/protection: Surgical    Comment: partial hyst  Other Topics Concern  . Not on file  Social History Narrative  . Not on file   Family History  Problem Relation Age of Onset  . Alcohol abuse Mother   . COPD Mother   . Alcohol abuse Father   . Heart disease Father   . Pneumonia Father   . Diabetes Father   . Colon cancer Father        Dx 75s; deceased 31s  . OCD Daughter   . Bipolar disorder Daughter   . Hypertension Daughter   . Schizophrenia Daughter   . Schizophrenia Daughter   . Bipolar disorder Daughter   . OCD Daughter   . Hypertension Daughter   . COPD Daughter   . Other Daughter        stomach don't empty out  . ADD / ADHD Grandchild   . Anxiety disorder Grandchild   . Depression Grandchild   . Healthy Sister   . Cancer Brother        leukemia 26s; deceased 61  . Heart disease Brother   . Cancer Brother        liver; currently 30  . Cirrhosis Brother   . Other Brother        waiting for liver transplant  . Alzheimer's disease Paternal Grandmother   . Ovarian cancer Maternal Grandmother 56       deceased 6  . Cancer Maternal Uncle        3 of 6 with various cancers: skin, NHL, Hodkin's Dx  . Breast cancer Cousin 84       mat first cousin related through unaffected aunt   ASSESSMENT Lab Results  Component Value Date   INR 2.9 09/20/2017   INR 2.6 08/28/2017   INR 2.7 08/02/2017   PROTIME 27.6 (H) 07/30/2013   Anticoagulation Dosing: Description   DR  Jessica Reilly PATIENT     INR today: Therapeutic  PLAN Weekly dose was unchanged  Patient Instructions  Patient educated about medication as defined in this  encounter and verbalized understanding by repeating back instructions provided.    Patient advised to contact clinic or seek medical attention if signs/symptoms of bleeding or thromboembolism occur.  Patient verbalized understanding by repeating back information and was advised to contact me if further medication-related questions arise. Patient was also provided an information handout.  Follow-up Return in about 2 weeks (around 10/04/2017).  [name]

## 2017-09-27 NOTE — Patient Instructions (Signed)
Patient educated about medication as defined in this encounter and verbalized understanding by repeating back instructions provided.   

## 2017-10-03 NOTE — Progress Notes (Signed)
Reviewed on day of testing thx DrG

## 2017-10-15 LAB — PROTIME-INR

## 2017-10-16 ENCOUNTER — Encounter: Payer: Self-pay | Admitting: Pharmacist

## 2017-10-16 DIAGNOSIS — I82629 Acute embolism and thrombosis of deep veins of unspecified upper extremity: Secondary | ICD-10-CM

## 2017-10-16 DIAGNOSIS — D689 Coagulation defect, unspecified: Secondary | ICD-10-CM

## 2017-10-16 DIAGNOSIS — D6851 Activated protein C resistance: Secondary | ICD-10-CM

## 2017-10-16 DIAGNOSIS — Z7901 Long term (current) use of anticoagulants: Secondary | ICD-10-CM

## 2017-10-16 LAB — POCT INR: INR: 3.1

## 2017-10-16 NOTE — Progress Notes (Signed)
Reviewed thx DrG 

## 2017-10-16 NOTE — Progress Notes (Signed)
Anticoagulation Management Sheretta H Barkeris a 57 y.o.femalewho was contactedfor monitoring of warfarintreatment. Patient has a home INR meter through [facility].  Indication: DVThistory, Factor V Leiden mutation Duration: indefinite Supervising physician: Murriel Hopper  Anticoagulation Clinic Visit History: Patient does not report signs/symptoms of bleeding or thromboembolism or other changes  Anticoagulation Episode Summary    Current INR goal:   2.0-3.0  TTR:   70.8 % (5.5 y)  Next INR check:   10/29/2017  INR from last check:   3.1! (10/16/2017)  Weekly max warfarin dose:     Target end date:   04/11/2015  INR check location:     Preferred lab:     Send INR reminders to:   [place]   Indications   DVT of upper extremity (deep vein thrombosis) ([place]) [I82.629] Factor V Leiden mutation ([place]) [D68.51] Coagulopathy ([place]) [D68.9] Chronic anticoagulation [Z79.01]       Comments:         Anticoagulation Care Providers    Provider Role Specialty Phone number   Annia Belt, MD Referring Oncology [phone]     Allergies  Allergen Reactions  . Amoxicillin-Pot Clavulanate Rash  . Ceftin Rash  . Cefuroxime Rash  . Cefuroxime Axetil Rash  . Risperidone And Related Other (See Comments)    Has medical contraindications   Medication Sig  acetaminophen (TYLENOL) 500 MG tablet Take 1,000 mg by mouth as needed.  alprazolam (XANAX) 2 MG tablet Take 2 mg by mouth 2 (two) times daily as needed (May take a 1 mg during the day but always takes 2 mg at bedtime.). For anxiety  anastrozole (ARIMIDEX) 1 MG tablet Take 1 mg by mouth daily.    cyanocobalamin (,VITAMIN B-12,) 1000 MCG/ML injection Inject 100 mcg into the muscle every 30 (thirty) days.   docusate sodium (STOOL SOFTENER) 100 MG capsule Take 2 capsules (200 mg total) by mouth at bedtime.  enoxaparin (LOVENOX) 150 MG/ML injection Inject 1 mL (150 mg total) into the skin daily.  esomeprazole (NEXIUM) 40 MG  capsule Take 40 mg by mouth 2 (two) times daily before a meal.  fish oil-omega-3 fatty acids 1000 MG capsule Take 1 g by mouth 2 (two) times daily.  fluticasone (FLONASE) 50 MCG/ACT nasal spray Place 2 sprays into the nose daily.   levocetirizine (XYZAL) 5 MG tablet TAKE ONE TABLET BY MOUTH AT BEDTIME.  lidocaine (LIDODERM) 5 % Place 1 patch onto the skin as needed. Remove & Discard patch within 12 hours or as directed by MD (only uses prn)  metoprolol (TOPROL-XL) 100 MG 24 hr tablet Take 100 mg by mouth daily.    promethazine (PHENERGAN) 25 MG tablet Take 25 mg by mouth as needed for nausea.   simvastatin (ZOCOR) 20 MG tablet Take 20 mg by mouth daily.   warfarin (COUMADIN) 7.5 MG tablet TAKE 1 TABLET BY MOUTH DAILY.   Past Medical History:  Diagnosis Date  . Abnormal Pap smear   . Acid reflux   . Bipolar disorder ([place]) 03/22/2013  . Breast cancer ([place])   . Breast disorder    cancer  . Chronic anticoagulation 03/22/2013  . Coagulopathy ([place]) 04/10/2012  . Constipation   . DVT of upper extremity (deep vein thrombosis) ([place]) 04/10/2012  . Factor V Leiden ([place])   . Factor V Leiden mutation ([place]) 04/10/2012  . Family history of ovarian cancer   . Fatty liver 06/04/2017  . Fibroid   . Gallstones   . Heart murmur   .  High cholesterol   . History of abnormal cervical Pap smear 01/06/2015  . History of breast cancer 01/06/2015  . HTN (hypertension)   . HX: breast cancer   . Hypertension 12/31/2012  . Kidney stones   . Kidney stones 06/04/2017  . Lobular carcinoma of breast, estrogen receptor positive, 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  . Mental disorder    panic attacks  . Osteoarthritis   . Pain in both knees   . Vaginal Pap smear, abnormal    Social History   Socioeconomic History  . Marital status: Divorced    Spouse name: Not on file  . Number of children: Not on file  . Years of education: Not on file  . Highest education level: Not on file  Social Needs  . Financial resource strain: Not on file   . Food insecurity - worry: Not on file  . Food insecurity - inability: Not on file  . Transportation needs - medical: Not on file  . Transportation needs - non-medical: Not on file  Occupational History  . Not on file  Tobacco Use  . Smoking status: Never Smoker  . Smokeless tobacco: Never Used  Substance and Sexual Activity  . Alcohol use: Yes    Alcohol/week: 0.0 oz    Comment: 07-25-2016 per pt rarely  . Drug use: No    Comment: 07-25-2016 Per pt no   . Sexual activity: No    Birth control/protection: Surgical    Comment: partial hyst  Other Topics Concern  . Not on file  Social History Narrative  . Not on file   Family History  Problem Relation Age of Onset  . Alcohol abuse Mother   . COPD Mother   . Alcohol abuse Father   . Heart disease Father   . Pneumonia Father   . Diabetes Father   . Colon cancer Father        Dx 98s; deceased 83s  . OCD Daughter   . Bipolar disorder Daughter   . Hypertension Daughter   . Schizophrenia Daughter   . Schizophrenia Daughter   . Bipolar disorder Daughter   . OCD Daughter   . Hypertension Daughter   . COPD Daughter   . Other Daughter        stomach don't empty out  . ADD / ADHD Grandchild   . Anxiety disorder Grandchild   . Depression Grandchild   . Healthy Sister   . Cancer Brother        leukemia 52s; deceased 83  . Heart disease Brother   . Cancer Brother        liver; currently 26  . Cirrhosis Brother   . Other Brother        waiting for liver transplant  . Alzheimer's disease Paternal Grandmother   . Ovarian cancer Maternal Grandmother 67       deceased 69  . Cancer Maternal Uncle        3 of 6 with various cancers: skin, NHL, Hodkin's Dx  . Breast cancer Cousin 39       mat first cousin related through unaffected aunt   ASSESSMENT Recent Results: Lab Results  Component Value Date   INR 3.1 10/16/2017   INR 2.9 09/20/2017   INR 2.6 08/28/2017   PROTIME 27.6 (H) 07/30/2013   Anticoagulation Dosing:  45 mg weekly Description   DR [name]     INR today: Therapeutic  PLAN Weekly dose was unchanged   Patient Instructions  Patient educated  about medication as defined in this encounter and verbalized understanding by repeating back instructions provided.    Patient advised to contact clinic or seek medical attention if signs/symptoms of bleeding or thromboembolism occur.  Patient verbalized understanding by repeating back information and was advised to contact me if further medication-related questions arise. Patient was also provided an information handout.  Follow-up Return in about 2 weeks (around 10/30/2017).  [name]

## 2017-10-16 NOTE — Patient Instructions (Signed)
Patient educated about medication as defined in this encounter and verbalized understanding by repeating back instructions provided.   

## 2017-10-22 LAB — PROTIME-INR

## 2017-11-09 ENCOUNTER — Encounter: Payer: Self-pay | Admitting: Pharmacist

## 2017-11-09 DIAGNOSIS — I82623 Acute embolism and thrombosis of deep veins of upper extremity, bilateral: Secondary | ICD-10-CM

## 2017-11-09 DIAGNOSIS — I82629 Acute embolism and thrombosis of deep veins of unspecified upper extremity: Secondary | ICD-10-CM

## 2017-11-09 DIAGNOSIS — D6851 Activated protein C resistance: Secondary | ICD-10-CM

## 2017-11-09 DIAGNOSIS — Z7901 Long term (current) use of anticoagulants: Secondary | ICD-10-CM

## 2017-11-09 DIAGNOSIS — D689 Coagulation defect, unspecified: Secondary | ICD-10-CM

## 2017-11-09 DIAGNOSIS — I82622 Acute embolism and thrombosis of deep veins of left upper extremity: Secondary | ICD-10-CM

## 2017-11-09 LAB — POCT INR: INR: 2.3

## 2017-11-09 MED ORDER — WARFARIN SODIUM 7.5 MG PO TABS: 7.5000 mg | ORAL_TABLET | Freq: Every day | ORAL | 3 refills | Status: DC

## 2017-11-09 NOTE — Patient Instructions (Signed)
Patient educated about medication as defined in this encounter and verbalized understanding by repeating back instructions provided.   

## 2017-11-09 NOTE — Progress Notes (Signed)
Anticoagulation Management Jessica H Barkeris a 57 y.o.femalewho was contactedfor monitoring of warfarintreatment. Patient has a home INR meter through [employer].  Indication: DVThistory, Factor V Leiden mutation Duration: indefinite Supervising physician: Murriel Hopper  Anticoagulation Clinic Visit History: Patient does not report signs/symptoms of bleeding or thromboembolism or other changes  Anticoagulation Episode Summary    Current INR goal:   2.0-3.0  TTR:   71.0 % (5.6 y)  Next INR check:   11/19/2017  INR from last check:   2.3 (11/09/2017)  Weekly max warfarin dose:     Target end date:   04/11/2015  INR check location:     Preferred lab:     Send INR reminders to:   [facility]   Indications   DVT of upper extremity (deep vein thrombosis) ([place]) [I82.629] Factor V Leiden mutation ([place]) [D68.51] Coagulopathy ([place]) [D68.9] Chronic anticoagulation [Z79.01]       Comments:         Anticoagulation Care Providers    Provider Role Specialty Phone number   Annia Belt, MD Referring Oncology [phone]     Allergies  Allergen Reactions  . Amoxicillin-Pot Clavulanate Rash  . Ceftin Rash  . Cefuroxime Rash  . Cefuroxime Axetil Rash  . Risperidone And Related Other (See Comments)    Has medical contraindications   Medication Sig  acetaminophen (TYLENOL) 500 MG tablet Take 1,000 mg by mouth as needed.  alprazolam (XANAX) 2 MG tablet Take 2 mg by mouth 2 (two) times daily as needed (May take a 1 mg during the day but always takes 2 mg at bedtime.). For anxiety  anastrozole (ARIMIDEX) 1 MG tablet Take 1 mg by mouth daily.    cyanocobalamin (,VITAMIN B-12,) 1000 MCG/ML injection Inject 100 mcg into the muscle every 30 (thirty) days.   docusate sodium (STOOL SOFTENER) 100 MG capsule Take 2 capsules (200 mg total) by mouth at bedtime.  enoxaparin (LOVENOX) 150 MG/ML injection Inject 1 mL (150 mg total) into the skin daily.  esomeprazole (NEXIUM) 40 MG  capsule Take 40 mg by mouth 2 (two) times daily before a meal.  fish oil-omega-3 fatty acids 1000 MG capsule Take 1 g by mouth 2 (two) times daily.  fluticasone (FLONASE) 50 MCG/ACT nasal spray Place 2 sprays into the nose daily.   levocetirizine (XYZAL) 5 MG tablet TAKE ONE TABLET BY MOUTH AT BEDTIME.  lidocaine (LIDODERM) 5 % Place 1 patch onto the skin as needed. Remove & Discard patch within 12 hours or as directed by MD (only uses prn)  metoprolol (TOPROL-XL) 100 MG 24 hr tablet Take 100 mg by mouth daily.    promethazine (PHENERGAN) 25 MG tablet Take 25 mg by mouth as needed for nausea.   simvastatin (ZOCOR) 20 MG tablet Take 20 mg by mouth daily.   warfarin (COUMADIN) 7.5 MG tablet TAKE 1 TABLET BY MOUTH DAILY.   Past Medical History:  Diagnosis Date  . Abnormal Pap smear   . Acid reflux   . Bipolar disorder ([place]) 03/22/2013  . Breast cancer ([place])   . Breast disorder    cancer  . Chronic anticoagulation 03/22/2013  . Coagulopathy ([place]) 04/10/2012  . Constipation   . DVT of upper extremity (deep vein thrombosis) ([place]) 04/10/2012  . Factor V Leiden ([place])   . Factor V Leiden mutation ([place]) 04/10/2012  . Family history of ovarian cancer   . Fatty liver 06/04/2017  . Fibroid   . Gallstones   . Heart murmur   .  High cholesterol   . History of abnormal cervical Pap smear 01/06/2015  . History of breast cancer 01/06/2015  . HTN (hypertension)   . HX: breast cancer   . Hypertension 12/31/2012  . Kidney stones   . Kidney stones 06/04/2017  . Lobular carcinoma of breast, estrogen receptor positive, 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  . Mental disorder    panic attacks  . Osteoarthritis   . Pain in both knees   . Vaginal Pap smear, abnormal    Social History   Socioeconomic History  . Marital status: Divorced    Spouse name: Not on file  . Number of children: Not on file  . Years of education: Not on file  . Highest education level: Not on file  Social Needs  . Financial resource strain: Not on file   . Food insecurity - worry: Not on file  . Food insecurity - inability: Not on file  . Transportation needs - medical: Not on file  . Transportation needs - non-medical: Not on file  Occupational History  . Not on file  Tobacco Use  . Smoking status: Never Smoker  . Smokeless tobacco: Never Used  Substance and Sexual Activity  . Alcohol use: Yes    Alcohol/week: 0.0 oz    Comment: 07-25-2016 per pt rarely  . Drug use: No    Comment: 07-25-2016 Per pt no   . Sexual activity: No    Birth control/protection: Surgical    Comment: partial hyst  Other Topics Concern  . Not on file  Social History Narrative  . Not on file   Family History  Problem Relation Age of Onset  . Alcohol abuse Mother   . COPD Mother   . Alcohol abuse Father   . Heart disease Father   . Pneumonia Father   . Diabetes Father   . Colon cancer Father        Dx 65s; deceased 85s  . OCD Daughter   . Bipolar disorder Daughter   . Hypertension Daughter   . Schizophrenia Daughter   . Schizophrenia Daughter   . Bipolar disorder Daughter   . OCD Daughter   . Hypertension Daughter   . COPD Daughter   . Other Daughter        stomach don't empty out  . ADD / ADHD Grandchild   . Anxiety disorder Grandchild   . Depression Grandchild   . Healthy Sister   . Cancer Brother        leukemia 57s; deceased 46  . Heart disease Brother   . Cancer Brother        liver; currently 59  . Cirrhosis Brother   . Other Brother        waiting for liver transplant  . Alzheimer's disease Paternal Grandmother   . Ovarian cancer Maternal Grandmother 70       deceased 44  . Cancer Maternal Uncle        3 of 6 with various cancers: skin, NHL, Hodkin's Dx  . Breast cancer Cousin 28       mat first cousin related through unaffected aunt   ASSESSMENT Recent Results: Lab Results  Component Value Date   INR 2.3 11/09/2017   INR 3.1 10/16/2017   INR 2.9 09/20/2017   PROTIME 27.6 (H) 07/30/2013   Anticoagulation  Dosing: Description   DR [name]     INR today: Therapeutic  PLAN Weekly dose was unchanged   Patient Instructions  Patient educated about medication as  defined in this encounter and verbalized understanding by repeating back instructions provided.    Patient advised to contact clinic or seek medical attention if signs/symptoms of bleeding or thromboembolism occur.  Patient verbalized understanding by repeating back information and was advised to contact me if further medication-related questions arise. Patient was also provided an information handout.  Follow-up Return in about 2 weeks (around 11/23/2017).  [name]

## 2017-11-09 NOTE — Progress Notes (Signed)
Reviewed thx DrG 

## 2017-11-09 NOTE — Addendum Note (Signed)
Addended by: Forde Dandy on: 11/09/2017 12:55 PM   Modules accepted: Orders

## 2017-11-13 ENCOUNTER — Ambulatory Visit (INDEPENDENT_AMBULATORY_CARE_PROVIDER_SITE_OTHER): Payer: Medicare Other | Admitting: Internal Medicine

## 2017-11-13 ENCOUNTER — Encounter (INDEPENDENT_AMBULATORY_CARE_PROVIDER_SITE_OTHER): Payer: Self-pay | Admitting: *Deleted

## 2017-11-13 ENCOUNTER — Encounter (INDEPENDENT_AMBULATORY_CARE_PROVIDER_SITE_OTHER): Payer: Self-pay | Admitting: Internal Medicine

## 2017-11-13 VITALS — BP 114/70 | HR 72 | Temp 97.7°F | Resp 18 | Ht 62.0 in | Wt 212.2 lb

## 2017-11-13 DIAGNOSIS — E538 Deficiency of other specified B group vitamins: Secondary | ICD-10-CM | POA: Diagnosis not present

## 2017-11-13 DIAGNOSIS — K760 Fatty (change of) liver, not elsewhere classified: Secondary | ICD-10-CM

## 2017-11-13 DIAGNOSIS — J069 Acute upper respiratory infection, unspecified: Secondary | ICD-10-CM | POA: Diagnosis not present

## 2017-11-13 NOTE — Patient Instructions (Signed)
Physician will call with results of ultrasound and elastogrphy when completed

## 2017-11-13 NOTE — Progress Notes (Signed)
Reason for consultation:  Fatty liver.  History of present illness.:  Patient is 57 year old Caucasian female who has been referred through courtesy of Ms. Derrek Monaco NP for evaluation of fatty liver. Patient was having flank pain and she had renal ultrasound which revealed fatty liver.  Patient is very concerned because her younger brother has liver problems and has had liver transplant. She has never been told that she has elevated transaminases.  She has good appetite.  Lately her weight has been stable.  10 years ago she weighed 255 pounds.  She states she is trying her best to maintain her weight.  She denies abdominal pain pruritus melena or rectal bleeding.  Her bowels move regularly as long as she takes Colace.  Lately she has been taking Tylenol every day but she does not take more than 2 tablets a day.  She states heartburn is well controlled with double dose PPI.  If she does not take second dose she gets excruciating squeezing chest pain. Patient states she is not able to do a lot of physical activity or walking on account of bilateral knee arthritis.   Current Medications: Outpatient Encounter Medications as of 11/13/2017  Medication Sig  . acetaminophen (TYLENOL) 500 MG tablet Take 1,000 mg by mouth as needed.  Marland Kitchen alprazolam (XANAX) 2 MG tablet Take 2 mg by mouth 2 (two) times daily as needed (May take a 1 mg during the day but always takes 2 mg at bedtime.). For anxiety  . anastrozole (ARIMIDEX) 1 MG tablet Take 1 mg by mouth daily.    . cyanocobalamin (,VITAMIN B-12,) 1000 MCG/ML injection Inject 100 mcg into the muscle every 30 (thirty) days.   Marland Kitchen docusate sodium (STOOL SOFTENER) 100 MG capsule Take 2 capsules (200 mg total) by mouth at bedtime.  Marland Kitchen esomeprazole (NEXIUM) 40 MG capsule Take 40 mg by mouth 2 (two) times daily before a meal.  . fish oil-omega-3 fatty acids 1000 MG capsule Take 1 g by mouth 2 (two) times daily.  . fluticasone (FLONASE) 50 MCG/ACT nasal spray Place  2 sprays into the nose daily.   Marland Kitchen levocetirizine (XYZAL) 5 MG tablet TAKE ONE TABLET BY MOUTH AT BEDTIME.  Marland Kitchen lidocaine (LIDODERM) 5 % Place 1 patch onto the skin as needed. Remove & Discard patch within 12 hours or as directed by MD (only uses prn)  . metoprolol (TOPROL-XL) 100 MG 24 hr tablet Take 100 mg by mouth daily.    . promethazine (PHENERGAN) 25 MG tablet Take 25 mg by mouth as needed for nausea.   . simvastatin (ZOCOR) 20 MG tablet Take 20 mg by mouth daily.   Marland Kitchen warfarin (COUMADIN) 7.5 MG tablet Take 1 tablet (7.5 mg total) by mouth daily. Except 1/2 tablet on Tue/Thur  . [DISCONTINUED] enoxaparin (LOVENOX) 150 MG/ML injection Inject 1 mL (150 mg total) into the skin daily. (Patient not taking: Reported on 11/13/2017)   No facility-administered encounter medications on file as of 11/13/2017.    Past Medical History:  Diagnosis Date  .    Marland Kitchen GERD.   . Bipolar disorder ([place]) 03/22/2013  . Breast cancer ([place])   .  History of colonic polyps.    IBS  . Chronic anticoagulation 03/22/2013  .  04/10/2012  .    Marland Kitchen DVT of upper extremity (deep vein thrombosis) ([place]) 04/10/2012  .    Marland Kitchen Factor V Leiden mutation ([place]) 04/10/2012  .    .  06/04/2017  . Fibroid   . Gallstones   .  Heart murmur   . High cholesterol   .  01/06/2015  .  01/06/2015  . HTN (hypertension)   . HX: breast cancer   . Hypertension 12/31/2012  .    Marland Kitchen Kidney stones 06/04/2017  .  04/10/2012  .     panic attacks  . Osteoarthritis   . Pain in both knees   .     Past Surgical History:  Procedure Laterality Date  . BREAST LUMPECTOMY    . COLONOSCOPY WITH ESOPHAGOGASTRODUODENOSCOPY (EGD) N/A 07/23/2013   Procedure: COLONOSCOPY WITH ESOPHAGOGASTRODUODENOSCOPY (EGD);  Surgeon: Rogene Houston, MD;  Location: [facility];  Service: Endoscopy;  Laterality: N/A;  125  . LYMPH NODE BIOPSY    . MASTECTOMY  bilateral  . ovaries removed    . TONSILLECTOMY AND ADENOIDECTOMY      Family history:  Father had multiple medical  problems including colon carcinoma diagnosed in his 54s and he died in his 2s. Disease.  She had COPD. She has 1 sister in good health. One brother died of leukemia at age 68. One brother has been treated for hepatitis C and underwent liver transplant.  He is in his mid 14s.  Patient does not know the details.  Social history:  She has 2 daughters and they both have schizophrenia.  She is disabled.  She does not smoke cigarettes or drink alcohol.   Objective: Blood pressure 114/70, pulse 72, temperature 97.7 F (36.5 C), temperature source Oral, resp. rate 18, height 5\' 2"  (1.575 m), weight 212 lb 3.2 oz (96.3 kg). She is alert and in no acute distress. Conjunctiva is pink. Sclera is nonicteric Oropharyngeal mucosa is normal. No neck masses or thyromegaly noted. Cardiac exam with regular rhythm normal S1 and S2. No murmur or gallop noted. Lungs are clear to auscultation. Abdomen is full.  Bowel sounds are normal.  On palpation abdomen is soft with mild tenderness at LLQ.  Liver edge is indistinct below RCM.  It does not appear to be enlarged.  Spleen is not palpable. No LE edema or clubbing noted.  Labs/studies Results:  Ultrasound from June 2009 reviewed.  It showed fatty liver.  Renal US from 05/30/2018 also showed fatty liver.  LFTs from 05/25/2017. Bilirubin 0.3, AP 75, AST 16, ALT 18, total protein 7.1 albumin 4.3.   Assessment:  #1.Marland Kitchen  Fatty liver.  Review of patient's old records it appears she has had fatty liver for 10 years.  Her transaminases have always been normal.  She does not have stigmata of chronic liver disease.  Family history of liver disease is significant her brother has had liver transplant but it does not appear.  That he had fatty liver disease.  Her condition can be easily assessed noninvasively with ultrasound and elastography.  #2.  History of colonic adenomas.   Plan:  Upper abdominal ultrasound with elastography. Surveillance colonoscopy later  this year. To increase physical activity as tolerated.  She should try to lose some weight.

## 2017-11-20 ENCOUNTER — Ambulatory Visit (HOSPITAL_COMMUNITY): Admission: RE | Admit: 2017-11-20 | Payer: Medicare Other | Source: Ambulatory Visit

## 2017-11-27 ENCOUNTER — Ambulatory Visit (HOSPITAL_COMMUNITY)
Admission: RE | Admit: 2017-11-27 | Discharge: 2017-11-27 | Disposition: A | Discharge status: Home / Self Care | Payer: Medicare Other | Source: Ambulatory Visit | Attending: Internal Medicine | Admitting: Internal Medicine

## 2017-11-27 DIAGNOSIS — K8020 Calculus of gallbladder without cholecystitis without obstruction: Secondary | ICD-10-CM | POA: Insufficient documentation

## 2017-11-27 DIAGNOSIS — K760 Fatty (change of) liver, not elsewhere classified: Secondary | ICD-10-CM | POA: Diagnosis not present

## 2017-11-28 ENCOUNTER — Encounter: Payer: Self-pay | Admitting: Pharmacist

## 2017-11-28 DIAGNOSIS — I82629 Acute embolism and thrombosis of deep veins of unspecified upper extremity: Secondary | ICD-10-CM

## 2017-11-28 DIAGNOSIS — I82623 Acute embolism and thrombosis of deep veins of upper extremity, bilateral: Secondary | ICD-10-CM

## 2017-11-28 DIAGNOSIS — I82622 Acute embolism and thrombosis of deep veins of left upper extremity: Secondary | ICD-10-CM

## 2017-11-28 DIAGNOSIS — Z7901 Long term (current) use of anticoagulants: Secondary | ICD-10-CM

## 2017-11-28 DIAGNOSIS — D689 Coagulation defect, unspecified: Secondary | ICD-10-CM

## 2017-11-28 DIAGNOSIS — D6851 Activated protein C resistance: Secondary | ICD-10-CM

## 2017-11-28 LAB — POCT INR: INR: 2.2

## 2017-11-28 MED ORDER — OMEGA-3 FISH OIL 1000 MG PO CAPS: 1.0000 | ORAL_CAPSULE | Freq: Two times a day (BID) | ORAL | 3 refills | Status: DC

## 2017-11-28 NOTE — Patient Instructions (Signed)
Patient educated about medication as defined in this encounter and verbalized understanding by repeating back instructions provided.   

## 2017-11-28 NOTE — Progress Notes (Signed)
Reviewed thx DrG 

## 2017-11-28 NOTE — Progress Notes (Signed)
Anticoagulation Management Jessica H Barkeris a 57 y.o.femalewho was contactedfor monitoring of warfarintreatment. Patient has a home INR meter through [facility].  Indication: DVThistory, Factor V Leiden mutation Duration: indefinite Supervising physician: Murriel Hopper  Anticoagulation Clinic Visit History: Patientdoes notreport signs/symptoms of bleeding or thromboembolism or other changes  Anticoagulation Episode Summary    Current INR goal:   2.0-3.0  TTR:   71.3 % (5.6 y)  Next INR check:   12/12/2017  INR from last check:   2.2 (11/28/2017)  Weekly max warfarin dose:     Target end date:   04/11/2015  INR check location:     Preferred lab:     Send INR reminders to:   [facility]   Indications   DVT of upper extremity (deep vein thrombosis) ([place]) [I82.629] Factor V Leiden mutation ([place]) [D68.51] Coagulopathy ([place]) [D68.9] Chronic anticoagulation [Z79.01]       Comments:         Anticoagulation Care Providers    Provider Role Specialty Phone number   Annia Belt, MD Referring Oncology [phone]     Allergies  Allergen Reactions  . Amoxicillin-Pot Clavulanate Rash  . Ceftin Rash  . Cefuroxime Rash  . Cefuroxime Axetil Rash  . Risperidone And Related Other (See Comments)    Has medical contraindications   Medication Sig  acetaminophen (TYLENOL) 500 MG tablet Take 1,000 mg by mouth as needed.  alprazolam (XANAX) 2 MG tablet Take 2 mg by mouth 2 (two) times daily as needed (May take a 1 mg during the day but always takes 2 mg at bedtime.). For anxiety  anastrozole (ARIMIDEX) 1 MG tablet Take 1 mg by mouth daily.    cyanocobalamin (,VITAMIN B-12,) 1000 MCG/ML injection Inject 100 mcg into the muscle every 30 (thirty) days.   docusate sodium (STOOL SOFTENER) 100 MG capsule Take 2 capsules (200 mg total) by mouth at bedtime.  esomeprazole (NEXIUM) 40 MG capsule Take 40 mg by mouth 2 (two) times daily before a meal.  fish oil-omega-3 fatty  acids 1000 MG capsule Take 1 g by mouth 2 (two) times daily.  fluticasone (FLONASE) 50 MCG/ACT nasal spray Place 2 sprays into the nose daily.   levocetirizine (XYZAL) 5 MG tablet TAKE ONE TABLET BY MOUTH AT BEDTIME.  lidocaine (LIDODERM) 5 % Place 1 patch onto the skin as needed. Remove & Discard patch within 12 hours or as directed by MD (only uses prn)  metoprolol (TOPROL-XL) 100 MG 24 hr tablet Take 100 mg by mouth daily.    promethazine (PHENERGAN) 25 MG tablet Take 25 mg by mouth as needed for nausea.   simvastatin (ZOCOR) 20 MG tablet Take 20 mg by mouth daily.   warfarin (COUMADIN) 7.5 MG tablet Take 1 tablet (7.5 mg total) by mouth daily. Except 1/2 tablet on Tue/Thur   Past Medical History:  Diagnosis Date  . Abnormal Pap smear   . Acid reflux   . Bipolar disorder ([place]) 03/22/2013  . Breast cancer ([place])   . Breast disorder    cancer  . Chronic anticoagulation 03/22/2013  . Coagulopathy ([place]) 04/10/2012  . Constipation   . DVT of upper extremity (deep vein thrombosis) ([place]) 04/10/2012  . Factor V Leiden ([place])   . Factor V Leiden mutation ([place]) 04/10/2012  . Family history of ovarian cancer   . Fatty liver 06/04/2017  . Fibroid   . Gallstones   . Heart murmur   . High cholesterol   . History of abnormal cervical Pap  smear 01/06/2015  . History of breast cancer 01/06/2015  . HTN (hypertension)   . HX: breast cancer   . Hypertension 12/31/2012  . Kidney stones   . Kidney stones 06/04/2017  . Lobular carcinoma of breast, estrogen receptor positive, 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  . Mental disorder    panic attacks  . Osteoarthritis   . Pain in both knees   . Vaginal Pap smear, abnormal    Social History   Socioeconomic History  . Marital status: Divorced    Spouse name: Not on file  . Number of children: Not on file  . Years of education: Not on file  . Highest education level: Not on file  Social Needs  . Financial resource strain: Not on file  . Food insecurity - worry: Not on file   . Food insecurity - inability: Not on file  . Transportation needs - medical: Not on file  . Transportation needs - non-medical: Not on file  Occupational History  . Not on file  Tobacco Use  . Smoking status: Never Smoker  . Smokeless tobacco: Never Used  Substance and Sexual Activity  . Alcohol use: Yes    Alcohol/week: 0.0 oz    Comment: 07-25-2016 per pt rarely  . Drug use: No    Comment: 07-25-2016 Per pt no   . Sexual activity: No    Birth control/protection: Surgical    Comment: partial hyst  Other Topics Concern  . Not on file  Social History Narrative  . Not on file   Family History  Problem Relation Age of Onset  . Alcohol abuse Mother   . COPD Mother   . Alcohol abuse Father   . Heart disease Father   . Pneumonia Father   . Diabetes Father   . Colon cancer Father        Dx 3s; deceased 5s  . OCD Daughter   . Bipolar disorder Daughter   . Hypertension Daughter   . Schizophrenia Daughter   . Schizophrenia Daughter   . Bipolar disorder Daughter   . OCD Daughter   . Hypertension Daughter   . COPD Daughter   . Other Daughter        stomach don't empty out  . ADD / ADHD Grandchild   . Anxiety disorder Grandchild   . Depression Grandchild   . Healthy Sister   . Cancer Brother        leukemia 54s; deceased 33  . Heart disease Brother   . Cancer Brother        liver; currently 6  . Cirrhosis Brother   . Other Brother        waiting for liver transplant  . Alzheimer's disease Paternal Grandmother   . Ovarian cancer Maternal Grandmother 41       deceased 37  . Cancer Maternal Uncle        3 of 6 with various cancers: skin, NHL, Hodkin's Dx  . Breast cancer Cousin 3       mat first cousin related through unaffected aunt   ASSESSMENT Recent Results: Lab Results  Component Value Date   INR 2.2 11/28/2017   INR 2.3 11/09/2017   INR 3.1 10/16/2017   PROTIME 27.6 (H) 07/30/2013   Anticoagulation Dosing: Description   DR [name]      INR today: Therapeutic  PLAN Weekly dose was unchanged  Patient Instructions  Patient educated about medication as defined in this encounter and verbalized understanding by repeating back instructions  provided.   Patient advised to contact clinic or seek medical attention if signs/symptoms of bleeding or thromboembolism occur.  Patient verbalized understanding by repeating back information and was advised to contact me if further medication-related questions arise. Patient was also provided an information handout.  Follow-up Return in about 2 weeks (around 12/12/2017).  [name]

## 2017-11-28 NOTE — Addendum Note (Signed)
Addended by: Forde Dandy on: 11/28/2017 04:42 PM   Modules accepted: Orders

## 2017-11-29 DIAGNOSIS — D6851 Activated protein C resistance: Secondary | ICD-10-CM | POA: Diagnosis not present

## 2017-11-29 DIAGNOSIS — Z7901 Long term (current) use of anticoagulants: Secondary | ICD-10-CM | POA: Diagnosis not present

## 2017-12-04 ENCOUNTER — Other Ambulatory Visit: Payer: Self-pay | Admitting: Oncology

## 2017-12-04 ENCOUNTER — Ambulatory Visit (HOSPITAL_COMMUNITY): Payer: Medicare Other

## 2017-12-04 ENCOUNTER — Other Ambulatory Visit: Payer: Self-pay | Admitting: Pharmacist

## 2017-12-04 DIAGNOSIS — E7800 Pure hypercholesterolemia, unspecified: Secondary | ICD-10-CM

## 2017-12-04 DIAGNOSIS — I82623 Acute embolism and thrombosis of deep veins of upper extremity, bilateral: Secondary | ICD-10-CM

## 2017-12-04 DIAGNOSIS — I82629 Acute embolism and thrombosis of deep veins of unspecified upper extremity: Secondary | ICD-10-CM

## 2017-12-04 DIAGNOSIS — I82622 Acute embolism and thrombosis of deep veins of left upper extremity: Secondary | ICD-10-CM

## 2017-12-04 MED ORDER — WARFARIN SODIUM 7.5 MG PO TABS: 7.5000 mg | ORAL_TABLET | Freq: Every day | ORAL | 3 refills | Status: DC

## 2017-12-04 NOTE — Progress Notes (Signed)
Warfarin prescription did not process, re-sent Rx

## 2017-12-04 NOTE — Addendum Note (Signed)
Addended by: Forde Dandy on: 12/04/2017 03:43 PM   Modules accepted: Orders

## 2017-12-05 LAB — PROTIME-INR

## 2017-12-20 ENCOUNTER — Encounter: Payer: Self-pay | Admitting: Pharmacist

## 2017-12-20 DIAGNOSIS — D6851 Activated protein C resistance: Secondary | ICD-10-CM

## 2017-12-20 DIAGNOSIS — I82629 Acute embolism and thrombosis of deep veins of unspecified upper extremity: Secondary | ICD-10-CM

## 2017-12-20 DIAGNOSIS — Z7901 Long term (current) use of anticoagulants: Secondary | ICD-10-CM

## 2017-12-20 DIAGNOSIS — D689 Coagulation defect, unspecified: Secondary | ICD-10-CM

## 2017-12-20 LAB — POCT INR: INR: 2.2

## 2017-12-20 NOTE — Patient Instructions (Signed)
Patient educated about medication as defined in this encounter and verbalized understanding by repeating back instructions provided.   

## 2017-12-20 NOTE — Progress Notes (Signed)
Anticoagulation Management Jessica H Barkeris a 57 y.o.femalewho was contactedfor monitoring of warfarintreatment. Patient has a home INR meter through [employer].  Indication: DVThistory, Factor V Leiden mutation Duration: indefinite Supervising physician: Murriel Hopper  Anticoagulation Clinic Visit History: Patientdoes notreport signs/symptoms of bleeding or thromboembolism or other changes  Anticoagulation Episode Summary    Current INR goal:   2.0-3.0  TTR:   71.6 % (5.7 y)  Next INR check:   01/17/2018  INR from last check:   2.2 (12/20/2017)  Weekly max warfarin dose:     Target end date:   04/11/2015  INR check location:     Preferred lab:     Send INR reminders to:   [facility]   Indications   DVT of upper extremity (deep vein thrombosis) ([place]) [I82.629] Factor V Leiden mutation ([place]) [D68.51] Coagulopathy ([place]) [D68.9] Chronic anticoagulation [Z79.01]       Comments:         Anticoagulation Care Providers    Provider Role Specialty Phone number   Annia Belt, MD Referring Oncology [phone]     Allergies  Allergen Reactions  . Amoxicillin-Pot Clavulanate Rash  . Ceftin Rash  . Cefuroxime Rash  . Cefuroxime Axetil Rash  . Risperidone And Related Other (See Comments)    Has medical contraindications   Medication Sig  acetaminophen (TYLENOL) 500 MG tablet Take 1,000 mg by mouth as needed.  alprazolam (XANAX) 2 MG tablet Take 2 mg by mouth 2 (two) times daily as needed (May take a 1 mg during the day but always takes 2 mg at bedtime.). For anxiety  anastrozole (ARIMIDEX) 1 MG tablet Take 1 mg by mouth daily.    cyanocobalamin (,VITAMIN B-12,) 1000 MCG/ML injection Inject 100 mcg into the muscle every 30 (thirty) days.   docusate sodium (STOOL SOFTENER) 100 MG capsule Take 2 capsules (200 mg total) by mouth at bedtime.  esomeprazole (NEXIUM) 40 MG capsule Take 40 mg by mouth 2 (two) times daily before a meal.  fluticasone (FLONASE) 50  MCG/ACT nasal spray Place 2 sprays into the nose daily.   levocetirizine (XYZAL) 5 MG tablet TAKE ONE TABLET BY MOUTH AT BEDTIME.  lidocaine (LIDODERM) 5 % Place 1 patch onto the skin as needed. Remove & Discard patch within 12 hours or as directed by MD (only uses prn)  metoprolol (TOPROL-XL) 100 MG 24 hr tablet Take 100 mg by mouth daily.    omega-3 fish oil (MAXEPA) 1000 MG CAPS capsule Take 1 capsule (1,000 mg total) by mouth 2 (two) times daily.  promethazine (PHENERGAN) 25 MG tablet Take 25 mg by mouth as needed for nausea.   simvastatin (ZOCOR) 20 MG tablet Take 20 mg by mouth daily.   warfarin (COUMADIN) 7.5 MG tablet Take 1 tablet (7.5 mg total) by mouth daily. Except 1/2 tablet on Tue/Thur   Past Medical History:  Diagnosis Date  . Abnormal Pap smear   . Acid reflux   . Bipolar disorder ([place]) 03/22/2013  . Breast cancer ([place])   . Breast disorder    cancer  . Chronic anticoagulation 03/22/2013  . Coagulopathy ([place]) 04/10/2012  . Constipation   . DVT of upper extremity (deep vein thrombosis) ([place]) 04/10/2012  . Factor V Leiden ([place])   . Factor V Leiden mutation ([place]) 04/10/2012  . Family history of ovarian cancer   . Fatty liver 06/04/2017  . Fibroid   . Gallstones   . Heart murmur   . High cholesterol   . History  of abnormal cervical Pap smear 01/06/2015  . History of breast cancer 01/06/2015  . HTN (hypertension)   . HX: breast cancer   . Hypertension 12/31/2012  . Kidney stones   . Kidney stones 06/04/2017  . Lobular carcinoma of breast, estrogen receptor positive, 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  . Mental disorder    panic attacks  . Osteoarthritis   . Pain in both knees   . Vaginal Pap smear, abnormal    Social History   Socioeconomic History  . Marital status: Divorced    Spouse name: Not on file  . Number of children: Not on file  . Years of education: Not on file  . Highest education level: Not on file  Social Needs  . Financial resource strain: Not on file  . Food insecurity  - worry: Not on file  . Food insecurity - inability: Not on file  . Transportation needs - medical: Not on file  . Transportation needs - non-medical: Not on file  Occupational History  . Not on file  Tobacco Use  . Smoking status: Never Smoker  . Smokeless tobacco: Never Used  Substance and Sexual Activity  . Alcohol use: Yes    Alcohol/week: 0.0 oz    Comment: 07-25-2016 per pt rarely  . Drug use: No    Comment: 07-25-2016 Per pt no   . Sexual activity: No    Birth control/protection: Surgical    Comment: partial hyst  Other Topics Concern  . Not on file  Social History Narrative  . Not on file   Family History  Problem Relation Age of Onset  . Alcohol abuse Mother   . COPD Mother   . Alcohol abuse Father   . Heart disease Father   . Pneumonia Father   . Diabetes Father   . Colon cancer Father        Dx 74s; deceased 59s  . OCD Daughter   . Bipolar disorder Daughter   . Hypertension Daughter   . Schizophrenia Daughter   . Schizophrenia Daughter   . Bipolar disorder Daughter   . OCD Daughter   . Hypertension Daughter   . COPD Daughter   . Other Daughter        stomach don't empty out  . ADD / ADHD Grandchild   . Anxiety disorder Grandchild   . Depression Grandchild   . Healthy Sister   . Cancer Brother        leukemia 40s; deceased 56  . Heart disease Brother   . Cancer Brother        liver; currently 67  . Cirrhosis Brother   . Other Brother        waiting for liver transplant  . Alzheimer's disease Paternal Grandmother   . Ovarian cancer Maternal Grandmother 96       deceased 49  . Cancer Maternal Uncle        3 of 6 with various cancers: skin, NHL, Hodkin's Dx  . Breast cancer Cousin 36       mat first cousin related through unaffected aunt   ASSESSMENT Recent Results: Lab Results  Component Value Date   INR 2.2 12/20/2017   INR 2.2 11/28/2017   INR 2.3 11/09/2017   PROTIME 27.6 (H) 07/30/2013   Anticoagulation Dosing: Description   DR  [name]     INR today: Therapeutic  PLAN Weekly dose was unchanged   Patient Instructions  Patient educated about medication as defined in this encounter and verbalized  understanding by repeating back instructions provided.    Patient advised to contact clinic or seek medical attention if signs/symptoms of bleeding or thromboembolism occur.  Patient verbalized understanding by repeating back information and was advised to contact me if further medication-related questions arise. Patient was also provided an information handout.  Follow-up 4 weeks  [name]

## 2017-12-28 LAB — PROTIME-INR

## 2018-01-10 ENCOUNTER — Telehealth (INDEPENDENT_AMBULATORY_CARE_PROVIDER_SITE_OTHER): Payer: Medicare Other | Admitting: Pharmacist

## 2018-01-10 DIAGNOSIS — I82629 Acute embolism and thrombosis of deep veins of unspecified upper extremity: Secondary | ICD-10-CM

## 2018-01-10 DIAGNOSIS — D6851 Activated protein C resistance: Secondary | ICD-10-CM | POA: Diagnosis not present

## 2018-01-10 DIAGNOSIS — D689 Coagulation defect, unspecified: Secondary | ICD-10-CM | POA: Diagnosis not present

## 2018-01-10 DIAGNOSIS — Z86718 Personal history of other venous thrombosis and embolism: Secondary | ICD-10-CM

## 2018-01-10 DIAGNOSIS — Z5181 Encounter for therapeutic drug level monitoring: Secondary | ICD-10-CM | POA: Diagnosis not present

## 2018-01-10 DIAGNOSIS — Z7901 Long term (current) use of anticoagulants: Secondary | ICD-10-CM

## 2018-01-10 LAB — POCT INR: INR: 2.8

## 2018-01-10 NOTE — Progress Notes (Signed)
Anticoagulation Management Jessica H Barkeris a 57 y.o.femalewho was contactedfor monitoring of warfarintreatment. Patient has a home INR meter through [facility].  Indication: DVThistory, Factor V Leiden mutation Duration: indefinite Supervising physician: Murriel Hopper  Anticoagulation Clinic Visit History: Patientdoes notreport signs/symptoms of bleeding or thromboembolism or other changes  Anticoagulation Episode Summary    Current INR goal:   2.0-3.0  TTR:   71.8 % (5.7 y)  Next INR check:   01/17/2018  INR from last check:   2.8 (01/10/2018)  Weekly max warfarin dose:     Target end date:   04/11/2015  INR check location:     Preferred lab:     Send INR reminders to:   [facility]   Indications   DVT of upper extremity (deep vein thrombosis) ([place]) [I82.629] Factor V Leiden mutation ([place]) [D68.51] Coagulopathy ([place]) [D68.9] Chronic anticoagulation [Z79.01]       Comments:         Anticoagulation Care Providers    Provider Role Specialty Phone number   Annia Belt, MD Referring Oncology [phone]      Allergies  Allergen Reactions  . Amoxicillin-Pot Clavulanate Rash  . Ceftin Rash  . Cefuroxime Rash  . Cefuroxime Axetil Rash  . Risperidone And Related Other (See Comments)    Has medical contraindications   Medication Sig  acetaminophen (TYLENOL) 500 MG tablet Take 1,000 mg by mouth as needed.  alprazolam (XANAX) 2 MG tablet Take 2 mg by mouth 2 (two) times daily as needed (May take a 1 mg during the day but always takes 2 mg at bedtime.). For anxiety  anastrozole (ARIMIDEX) 1 MG tablet Take 1 mg by mouth daily.    cyanocobalamin (,VITAMIN B-12,) 1000 MCG/ML injection Inject 100 mcg into the muscle every 30 (thirty) days.   docusate sodium (STOOL SOFTENER) 100 MG capsule Take 2 capsules (200 mg total) by mouth at bedtime.  esomeprazole (NEXIUM) 40 MG capsule Take 40 mg by mouth 2 (two) times daily before a meal.  fluticasone (FLONASE) 50  MCG/ACT nasal spray Place 2 sprays into the nose daily.   levocetirizine (XYZAL) 5 MG tablet TAKE ONE TABLET BY MOUTH AT BEDTIME.  lidocaine (LIDODERM) 5 % Place 1 patch onto the skin as needed. Remove & Discard patch within 12 hours or as directed by MD (only uses prn)  metoprolol (TOPROL-XL) 100 MG 24 hr tablet Take 100 mg by mouth daily.    omega-3 fish oil (MAXEPA) 1000 MG CAPS capsule Take 1 capsule (1,000 mg total) by mouth 2 (two) times daily.  promethazine (PHENERGAN) 25 MG tablet Take 25 mg by mouth as needed for nausea.   simvastatin (ZOCOR) 20 MG tablet Take 20 mg by mouth daily.   warfarin (COUMADIN) 7.5 MG tablet Take 1 tablet (7.5 mg total) by mouth daily. Except 1/2 tablet on Tue/Thur   Past Medical History:  Diagnosis Date  . Abnormal Pap smear   . Acid reflux   . Bipolar disorder ([place]) 03/22/2013  . Breast cancer ([place])   . Breast disorder    cancer  . Chronic anticoagulation 03/22/2013  . Coagulopathy ([place]) 04/10/2012  . Constipation   . DVT of upper extremity (deep vein thrombosis) ([place]) 04/10/2012  . Factor V Leiden ([place])   . Factor V Leiden mutation ([place]) 04/10/2012  . Family history of ovarian cancer   . Fatty liver 06/04/2017  . Fibroid   . Gallstones   . Heart murmur   . High cholesterol   .  History of abnormal cervical Pap smear 01/06/2015  . History of breast cancer 01/06/2015  . HTN (hypertension)   . HX: breast cancer   . Hypertension 12/31/2012  . Kidney stones   . Kidney stones 06/04/2017  . Lobular carcinoma of breast, estrogen receptor positive, 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  . Mental disorder    panic attacks  . Osteoarthritis   . Pain in both knees   . Vaginal Pap smear, abnormal    Social History   Socioeconomic History  . Marital status: Divorced    Spouse name: Not on file  . Number of children: Not on file  . Years of education: Not on file  . Highest education level: Not on file  Occupational History  . Not on file  Social Needs  . Financial resource  strain: Not on file  . Food insecurity:    Worry: Not on file    Inability: Not on file  . Transportation needs:    Medical: Not on file    Non-medical: Not on file  Tobacco Use  . Smoking status: Never Smoker  . Smokeless tobacco: Never Used  Substance and Sexual Activity  . Alcohol use: Yes    Alcohol/week: 0.0 oz    Comment: 07-25-2016 per pt rarely  . Drug use: No    Comment: 07-25-2016 Per pt no   . Sexual activity: Never    Birth control/protection: Surgical    Comment: partial hyst  Lifestyle  . Physical activity:    Days per week: Not on file    Minutes per session: Not on file  . Stress: Not on file  Relationships  . Social connections:    Talks on phone: Not on file    Gets together: Not on file    Attends religious service: Not on file    Active member of club or organization: Not on file    Attends meetings of clubs or organizations: Not on file    Relationship status: Not on file  Other Topics Concern  . Not on file  Social History Narrative  . Not on file   Family History  Problem Relation Age of Onset  . Alcohol abuse Mother   . COPD Mother   . Alcohol abuse Father   . Heart disease Father   . Pneumonia Father   . Diabetes Father   . Colon cancer Father        Dx 62s; deceased 60s  . OCD Daughter   . Bipolar disorder Daughter   . Hypertension Daughter   . Schizophrenia Daughter   . Schizophrenia Daughter   . Bipolar disorder Daughter   . OCD Daughter   . Hypertension Daughter   . COPD Daughter   . Other Daughter        stomach don't empty out  . ADD / ADHD Grandchild   . Anxiety disorder Grandchild   . Depression Grandchild   . Healthy Sister   . Cancer Brother        leukemia 57s; deceased 69  . Heart disease Brother   . Cancer Brother        liver; currently 53  . Cirrhosis Brother   . Other Brother        waiting for liver transplant  . Alzheimer's disease Paternal Grandmother   . Ovarian cancer Maternal Grandmother 87        deceased 62  . Cancer Maternal Uncle        3 of 6 with various  cancers: skin, NHL, Hodkin's Dx  . Breast cancer Cousin 86       mat first cousin related through unaffected aunt   ASSESSMENT Recent Results: Lab Results  Component Value Date   INR 2.8 01/10/2018   INR 2.2 12/20/2017   INR 2.2 11/28/2017   PROTIME 27.6 (H) 07/30/2013   Anticoagulation Dosing: Description   DR [name]     INR today: Therapeutic  PLAN Weekly dose was unchanged   Patient Instructions  Patient educated about medication as defined in this encounter and verbalized understanding by repeating back instructions provided.   Patient advised to contact clinic or seek medical attention if signs/symptoms of bleeding or thromboembolism occur.  Patient verbalized understanding by repeating back information and was advised to contact me if further medication-related questions arise. Patient was also provided an information handout.  Follow-up 2 weeks  [name]

## 2018-01-10 NOTE — Patient Instructions (Signed)
Patient educated about medication as defined in this encounter and verbalized understanding by repeating back instructions provided.   

## 2018-01-10 NOTE — Telephone Encounter (Signed)
Reviewed thx DRG

## 2018-01-24 ENCOUNTER — Telehealth (INDEPENDENT_AMBULATORY_CARE_PROVIDER_SITE_OTHER): Payer: Medicare Other

## 2018-01-24 DIAGNOSIS — Z7901 Long term (current) use of anticoagulants: Secondary | ICD-10-CM

## 2018-01-24 DIAGNOSIS — D6851 Activated protein C resistance: Secondary | ICD-10-CM | POA: Diagnosis not present

## 2018-01-24 DIAGNOSIS — D689 Coagulation defect, unspecified: Secondary | ICD-10-CM

## 2018-01-24 DIAGNOSIS — I82629 Acute embolism and thrombosis of deep veins of unspecified upper extremity: Secondary | ICD-10-CM

## 2018-01-24 LAB — POCT INR: INR: 2.2

## 2018-01-28 NOTE — Telephone Encounter (Signed)
Reviewed Thanks DrG 

## 2018-01-28 NOTE — Progress Notes (Signed)
INR results received 01/24/18, 2.2  INR therapeutic, no changes made. Patient notified.

## 2018-01-29 LAB — PROTIME-INR

## 2018-02-11 DIAGNOSIS — Z5181 Encounter for therapeutic drug level monitoring: Secondary | ICD-10-CM | POA: Diagnosis not present

## 2018-02-11 DIAGNOSIS — Z79811 Long term (current) use of aromatase inhibitors: Secondary | ICD-10-CM | POA: Diagnosis not present

## 2018-02-11 DIAGNOSIS — Z170 Estrogen receptor positive status [ER+]: Secondary | ICD-10-CM | POA: Diagnosis not present

## 2018-02-11 DIAGNOSIS — C50911 Malignant neoplasm of unspecified site of right female breast: Secondary | ICD-10-CM | POA: Diagnosis not present

## 2018-02-11 DIAGNOSIS — Z9013 Acquired absence of bilateral breasts and nipples: Secondary | ICD-10-CM | POA: Diagnosis not present

## 2018-02-11 DIAGNOSIS — M8588 Other specified disorders of bone density and structure, other site: Secondary | ICD-10-CM | POA: Diagnosis not present

## 2018-02-14 ENCOUNTER — Encounter: Payer: Self-pay | Admitting: Pharmacist

## 2018-02-14 DIAGNOSIS — I82629 Acute embolism and thrombosis of deep veins of unspecified upper extremity: Secondary | ICD-10-CM

## 2018-02-14 DIAGNOSIS — I82622 Acute embolism and thrombosis of deep veins of left upper extremity: Secondary | ICD-10-CM

## 2018-02-14 DIAGNOSIS — Z7901 Long term (current) use of anticoagulants: Secondary | ICD-10-CM

## 2018-02-14 DIAGNOSIS — I82623 Acute embolism and thrombosis of deep veins of upper extremity, bilateral: Secondary | ICD-10-CM

## 2018-02-14 DIAGNOSIS — D689 Coagulation defect, unspecified: Secondary | ICD-10-CM

## 2018-02-14 DIAGNOSIS — D6851 Activated protein C resistance: Secondary | ICD-10-CM

## 2018-02-14 LAB — POCT INR: INR: 2.8

## 2018-02-14 MED ORDER — WARFARIN SODIUM 7.5 MG PO TABS: 7.5000 mg | ORAL_TABLET | Freq: Every day | ORAL | 6 refills | Status: DC

## 2018-02-14 NOTE — Progress Notes (Signed)
Anticoagulation Management Jessica H Barkeris a 57 y.o.femalewho was contactedfor monitoring of warfarintreatment. Patient has a home INR meter through [facility].  Indication: DVThistory, Factor V Leiden mutation Duration: indefinite Supervising physician: Murriel Hopper  Anticoagulation Clinic Visit History: Patientdoes notreport signs/symptoms of bleeding or thromboembolism or other changes   Anticoagulation Episode Summary    Current INR goal:   2.0-3.0  TTR:   72.3 % (5.8 y)  Next INR check:   03/18/2018  INR from last check:   2.8 (02/14/2018)  Weekly max warfarin dose:     Target end date:   04/11/2015  INR check location:     Preferred lab:     Send INR reminders to:   [facility]   Indications   DVT of upper extremity (deep vein thrombosis) ([place]) [I82.629] Factor V Leiden mutation ([place]) [D68.51] Coagulopathy ([place]) [D68.9] Chronic anticoagulation [Z79.01]       Comments:         Anticoagulation Care Providers    Provider Role Specialty Phone number   Annia Belt, MD Referring Oncology [phone]     Allergies  Allergen Reactions  . Amoxicillin-Pot Clavulanate Rash  . Ceftin Rash  . Cefuroxime Rash  . Cefuroxime Axetil Rash  . Risperidone And Related Other (See Comments)    Has medical contraindications   Medication Sig  acetaminophen (TYLENOL) 500 MG tablet Take 1,000 mg by mouth as needed.  alprazolam (XANAX) 2 MG tablet Take 2 mg by mouth 2 (two) times daily as needed (May take a 1 mg during the day but always takes 2 mg at bedtime.). For anxiety  anastrozole (ARIMIDEX) 1 MG tablet Take 1 mg by mouth daily.    cyanocobalamin (,VITAMIN B-12,) 1000 MCG/ML injection Inject 100 mcg into the muscle every 30 (thirty) days.   docusate sodium (STOOL SOFTENER) 100 MG capsule Take 2 capsules (200 mg total) by mouth at bedtime.  esomeprazole (NEXIUM) 40 MG capsule Take 40 mg by mouth 2 (two) times daily before a meal.  fluticasone (FLONASE)  50 MCG/ACT nasal spray Place 2 sprays into the nose daily.   levocetirizine (XYZAL) 5 MG tablet TAKE ONE TABLET BY MOUTH AT BEDTIME.  lidocaine (LIDODERM) 5 % Place 1 patch onto the skin as needed. Remove & Discard patch within 12 hours or as directed by MD (only uses prn)  metoprolol (TOPROL-XL) 100 MG 24 hr tablet Take 100 mg by mouth daily.    omega-3 fish oil (MAXEPA) 1000 MG CAPS capsule Take 1 capsule (1,000 mg total) by mouth 2 (two) times daily.  promethazine (PHENERGAN) 25 MG tablet Take 25 mg by mouth as needed for nausea.   simvastatin (ZOCOR) 20 MG tablet Take 20 mg by mouth daily.   warfarin (COUMADIN) 7.5 MG tablet Take 1 tablet (7.5 mg total) by mouth daily. Except 1/2 tablet on Tue/Thur   Past Medical History:  Diagnosis Date  . Abnormal Pap smear   . Acid reflux   . Bipolar disorder ([place]) 03/22/2013  . Breast cancer ([place])   . Breast disorder    cancer  . Chronic anticoagulation 03/22/2013  . Coagulopathy ([place]) 04/10/2012  . Constipation   . DVT of upper extremity (deep vein thrombosis) ([place]) 04/10/2012  . Factor V Leiden ([place])   . Factor V Leiden mutation ([place]) 04/10/2012  . Family history of ovarian cancer   . Fatty liver 06/04/2017  . Fibroid   . Gallstones   . Heart murmur   . High cholesterol   .  History of abnormal cervical Pap smear 01/06/2015  . History of breast cancer 01/06/2015  . HTN (hypertension)   . HX: breast cancer   . Hypertension 12/31/2012  . Kidney stones   . Kidney stones 06/04/2017  . Lobular carcinoma of breast, estrogen receptor positive, 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  . Mental disorder    panic attacks  . Osteoarthritis   . Pain in both knees   . Vaginal Pap smear, abnormal    Social History   Socioeconomic History  . Marital status: Divorced    Spouse name: Not on file  . Number of children: Not on file  . Years of education: Not on file  . Highest education level: Not on file  Occupational History  . Not on file  Social Needs  . Financial  resource strain: Not on file  . Food insecurity:    Worry: Not on file    Inability: Not on file  . Transportation needs:    Medical: Not on file    Non-medical: Not on file  Tobacco Use  . Smoking status: Never Smoker  . Smokeless tobacco: Never Used  Substance and Sexual Activity  . Alcohol use: Yes    Alcohol/week: 0.0 oz    Comment: 07-25-2016 per pt rarely  . Drug use: No    Comment: 07-25-2016 Per pt no   . Sexual activity: Never    Birth control/protection: Surgical    Comment: partial hyst  Lifestyle  . Physical activity:    Days per week: Not on file    Minutes per session: Not on file  . Stress: Not on file  Relationships  . Social connections:    Talks on phone: Not on file    Gets together: Not on file    Attends religious service: Not on file    Active member of club or organization: Not on file    Attends meetings of clubs or organizations: Not on file    Relationship status: Not on file  Other Topics Concern  . Not on file  Social History Narrative  . Not on file   Family History  Problem Relation Age of Onset  . Alcohol abuse Mother   . COPD Mother   . Alcohol abuse Father   . Heart disease Father   . Pneumonia Father   . Diabetes Father   . Colon cancer Father        Dx 51s; deceased 41s  . OCD Daughter   . Bipolar disorder Daughter   . Hypertension Daughter   . Schizophrenia Daughter   . Schizophrenia Daughter   . Bipolar disorder Daughter   . OCD Daughter   . Hypertension Daughter   . COPD Daughter   . Other Daughter        stomach don't empty out  . ADD / ADHD Grandchild   . Anxiety disorder Grandchild   . Depression Grandchild   . Healthy Sister   . Cancer Brother        leukemia 32s; deceased 51  . Heart disease Brother   . Cancer Brother        liver; currently 38  . Cirrhosis Brother   . Other Brother        waiting for liver transplant  . Alzheimer's disease Paternal Grandmother   . Ovarian cancer Maternal Grandmother 30        deceased 12  . Cancer Maternal Uncle        3 of 6 with various  cancers: skin, NHL, Hodkin's Dx  . Breast cancer Cousin 79       mat first cousin related through unaffected aunt   ASSESSMENT Lab Results  Component Value Date   INR 2.8 02/14/2018   INR 2.2 01/24/2018   INR 2.8 01/10/2018   PROTIME 27.6 (H) 07/30/2013   Anticoagulation Dosing: Description   DR [name]     INR today: Therapeutic  PLAN Weekly dose was unchanged   Patient Instructions  Patient educated about medication as defined in this encounter and verbalized understanding by repeating back instructions provided.   Patient advised to contact clinic or seek medical attention if signs/symptoms of bleeding or thromboembolism occur.  Patient verbalized understanding by repeating back information and was advised to contact me if further medication-related questions arise. Patient was also provided an information handout.  Follow-up 1 month  [name]

## 2018-02-14 NOTE — Patient Instructions (Signed)
Patient educated about medication as defined in this encounter and verbalized understanding by repeating back instructions provided.   

## 2018-02-15 LAB — PROTIME-INR

## 2018-02-19 DIAGNOSIS — M1991 Primary osteoarthritis, unspecified site: Secondary | ICD-10-CM | POA: Diagnosis not present

## 2018-02-19 DIAGNOSIS — Z6839 Body mass index (BMI) 39.0-39.9, adult: Secondary | ICD-10-CM | POA: Diagnosis not present

## 2018-02-19 DIAGNOSIS — E782 Mixed hyperlipidemia: Secondary | ICD-10-CM | POA: Diagnosis not present

## 2018-02-19 DIAGNOSIS — E559 Vitamin D deficiency, unspecified: Secondary | ICD-10-CM | POA: Diagnosis not present

## 2018-02-19 DIAGNOSIS — M797 Fibromyalgia: Secondary | ICD-10-CM | POA: Diagnosis not present

## 2018-02-19 DIAGNOSIS — E538 Deficiency of other specified B group vitamins: Secondary | ICD-10-CM | POA: Diagnosis not present

## 2018-02-19 DIAGNOSIS — R7309 Other abnormal glucose: Secondary | ICD-10-CM | POA: Diagnosis not present

## 2018-02-20 DIAGNOSIS — M170 Bilateral primary osteoarthritis of knee: Secondary | ICD-10-CM | POA: Diagnosis not present

## 2018-02-20 DIAGNOSIS — M47816 Spondylosis without myelopathy or radiculopathy, lumbar region: Secondary | ICD-10-CM | POA: Diagnosis not present

## 2018-03-06 DIAGNOSIS — C50919 Malignant neoplasm of unspecified site of unspecified female breast: Secondary | ICD-10-CM | POA: Diagnosis not present

## 2018-03-06 DIAGNOSIS — Z9221 Personal history of antineoplastic chemotherapy: Secondary | ICD-10-CM | POA: Diagnosis not present

## 2018-03-06 DIAGNOSIS — Z853 Personal history of malignant neoplasm of breast: Secondary | ICD-10-CM | POA: Diagnosis not present

## 2018-03-06 DIAGNOSIS — Z9013 Acquired absence of bilateral breasts and nipples: Secondary | ICD-10-CM | POA: Diagnosis not present

## 2018-03-06 DIAGNOSIS — Z9889 Other specified postprocedural states: Secondary | ICD-10-CM | POA: Diagnosis not present

## 2018-03-06 DIAGNOSIS — Z08 Encounter for follow-up examination after completed treatment for malignant neoplasm: Secondary | ICD-10-CM | POA: Diagnosis not present

## 2018-03-18 ENCOUNTER — Telehealth (INDEPENDENT_AMBULATORY_CARE_PROVIDER_SITE_OTHER): Payer: Medicare Other

## 2018-03-18 DIAGNOSIS — D689 Coagulation defect, unspecified: Secondary | ICD-10-CM

## 2018-03-18 DIAGNOSIS — I82629 Acute embolism and thrombosis of deep veins of unspecified upper extremity: Secondary | ICD-10-CM | POA: Diagnosis not present

## 2018-03-18 DIAGNOSIS — D6851 Activated protein C resistance: Secondary | ICD-10-CM

## 2018-03-18 DIAGNOSIS — Z7901 Long term (current) use of anticoagulants: Secondary | ICD-10-CM | POA: Diagnosis not present

## 2018-03-18 LAB — POCT INR: INR: 1.3 — AB (ref 2.0–3.0)

## 2018-03-18 MED ORDER — ENOXAPARIN SODIUM 150 MG/ML ~~LOC~~ SOLN: 150.0000 mg | SUBCUTANEOUS | 0 refills | Status: DC

## 2018-03-18 NOTE — Telephone Encounter (Signed)
Reviewed I would cover her w lovenox 1.5 mg/kg/day until therapeutic on warfarin Thanks DrG

## 2018-03-18 NOTE — Patient Instructions (Addendum)
Patient was educated to increase dose as described

## 2018-03-18 NOTE — Addendum Note (Signed)
Addended by: Charlton Haws on: 03/18/2018 04:58 PM   Modules accepted: Orders

## 2018-03-18 NOTE — Progress Notes (Addendum)
Anticoagulation Management Jessica Reilly a 57 y.o.femalewho was contactedfor monitoring of warfarintreatment. Patient has a home INR meter through [facility].  Indication: DVThistory, Factor V Leiden mutation Duration: indefinite Supervising physician: Murriel Hopper  Anticoagulation Clinic Visit History: Patientdoes notreport signs/symptoms of bleeding or thromboembolism or other changes.    Anticoagulation Episode Summary    Current INR goal:   2.0-3.0  TTR:   72.0 % (5.9 y)  Next INR check:   03/22/2018  INR from last check:   1.3! (03/18/2018)  Weekly max warfarin dose:     Target end date:   04/11/2015  INR check location:     Preferred lab:     Send INR reminders to:   [facility]   Indications   DVT of upper extremity (deep vein thrombosis) (HCC) [I82.629] Factor V Leiden mutation ([place]) [D68.51] Coagulopathy ([place]) [D68.9] Chronic anticoagulation [Z79.01]       Comments:         Anticoagulation Care Providers    Provider Role Specialty Phone number   Annia Belt, MD Referring Oncology [phone]      Allergies  Allergen Reactions  . Amoxicillin-Pot Clavulanate Rash  . Ceftin Rash  . Cefuroxime Rash  . Cefuroxime Axetil Rash  . Risperidone And Related Other (See Comments)    Has medical contraindications   Medication Sig  acetaminophen (TYLENOL) 500 MG tablet Take 1,000 mg by mouth as needed.  alprazolam (XANAX) 2 MG tablet Take 2 mg by mouth 2 (two) times daily as needed (May take a 1 mg during the day but always takes 2 mg at bedtime.). For anxiety  anastrozole (ARIMIDEX) 1 MG tablet Take 1 mg by mouth daily.    cyanocobalamin (,VITAMIN B-12,) 1000 MCG/ML injection Inject 100 mcg into the muscle every 30 (thirty) days.   docusate sodium (STOOL SOFTENER) 100 MG capsule Take 2 capsules (200 mg total) by mouth at bedtime.  esomeprazole (NEXIUM) 40 MG capsule Take 40 mg by mouth 2 (two) times daily before a meal.  fluticasone  (FLONASE) 50 MCG/ACT nasal spray Place 2 sprays into the nose daily.   levocetirizine (XYZAL) 5 MG tablet TAKE ONE TABLET BY MOUTH AT BEDTIME.  lidocaine (LIDODERM) 5 % Place 1 patch onto the skin as needed. Remove & Discard patch within 12 hours or as directed by MD (only uses prn)  metoprolol (TOPROL-XL) 100 MG 24 hr tablet Take 100 mg by mouth daily.    omega-3 fish oil (MAXEPA) 1000 MG CAPS capsule Take 1 capsule (1,000 mg total) by mouth 2 (two) times daily.  promethazine (PHENERGAN) 25 MG tablet Take 25 mg by mouth as needed for nausea.   simvastatin (ZOCOR) 20 MG tablet Take 20 mg by mouth daily.   warfarin (COUMADIN) 7.5 MG tablet Take 1 tablet (7.5 mg total) by mouth daily. Except 1/2 tablet on Tue/Thur   Past Medical History:  Diagnosis Date  . Abnormal Pap smear   . Acid reflux   . Bipolar disorder ([place]) 03/22/2013  . Breast cancer ([place])   . Breast disorder    cancer  . Chronic anticoagulation 03/22/2013  . Coagulopathy ([place]) 04/10/2012  . Constipation   . DVT of upper extremity (deep vein thrombosis) ([place]) 04/10/2012  . Factor V Leiden ([place])   . Factor V Leiden mutation ([place]) 04/10/2012  . Family history of ovarian cancer   . Fatty liver 06/04/2017  . Fibroid   . Gallstones   . Heart murmur   . High cholesterol   .  History of abnormal cervical Pap smear 01/06/2015  . History of breast cancer 01/06/2015  . HTN (hypertension)   . HX: breast cancer   . Hypertension 12/31/2012  . Kidney stones   . Kidney stones 06/04/2017  . Lobular carcinoma of breast, estrogen receptor positive, 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  . Mental disorder    panic attacks  . Osteoarthritis   . Pain in both knees   . Vaginal Pap smear, abnormal    Social History   Socioeconomic History  . Marital status: Divorced    Spouse name: Not on file  . Number of children: Not on file  . Years of education: Not on file  . Highest education level: Not on file  Occupational History  . Not on file  Social Needs  .  Financial resource strain: Not on file  . Food insecurity:    Worry: Not on file    Inability: Not on file  . Transportation needs:    Medical: Not on file    Non-medical: Not on file  Tobacco Use  . Smoking status: Never Smoker  . Smokeless tobacco: Never Used  Substance and Sexual Activity  . Alcohol use: Yes    Alcohol/week: 0.0 oz    Comment: 07-25-2016 per pt rarely  . Drug use: No    Comment: 07-25-2016 Per pt no   . Sexual activity: Never    Birth control/protection: Surgical    Comment: partial hyst  Lifestyle  . Physical activity:    Days per week: Not on file    Minutes per session: Not on file  . Stress: Not on file  Relationships  . Social connections:    Talks on phone: Not on file    Gets together: Not on file    Attends religious service: Not on file    Active member of club or organization: Not on file    Attends meetings of clubs or organizations: Not on file    Relationship status: Not on file  Other Topics Concern  . Not on file  Social History Narrative  . Not on file   Family History  Problem Relation Age of Onset  . Alcohol abuse Mother   . COPD Mother   . Alcohol abuse Father   . Heart disease Father   . Pneumonia Father   . Diabetes Father   . Colon cancer Father        Dx 74s; deceased 56s  . OCD Daughter   . Bipolar disorder Daughter   . Hypertension Daughter   . Schizophrenia Daughter   . Schizophrenia Daughter   . Bipolar disorder Daughter   . OCD Daughter   . Hypertension Daughter   . COPD Daughter   . Other Daughter        stomach don't empty out  . ADD / ADHD Grandchild   . Anxiety disorder Grandchild   . Depression Grandchild   . Healthy Sister   . Cancer Brother        leukemia 57s; deceased 57  . Heart disease Brother   . Cancer Brother        liver; currently 32  . Cirrhosis Brother   . Other Brother        waiting for liver transplant  . Alzheimer's disease Paternal Grandmother   . Ovarian cancer Maternal  Grandmother 9       deceased 34  . Cancer Maternal Uncle        3 of 6 with various  cancers: skin, NHL, Hodkin's Dx  . Breast cancer Cousin 12       mat first cousin related through unaffected aunt    ASSESSMENT Recent Results: The most recent result is correlated with 45 mg per week: Lab Results  Component Value Date   INR 1.3 (A) 03/18/2018   INR 2.8 02/14/2018   INR 2.2 01/24/2018   PROTIME 27.6 (H) 07/30/2013    Anticoagulation Dosing: Description   DR [id number]'F PATIENT     INR today: Subtherapeutic. She was previously on chronic prednisone but this was stopped a few weeks ago which may explain the low INR.   PLAN Weekly dose was increased by 17% to 52.5 mg per week Lovenox 150 mg [place] once daily until INR 2-3 Re-check INR on Friday 6/14  Patient Instructions  Patient was educated to increase dose as described  Patient advised to contact clinic or seek medical attention if signs/symptoms of bleeding or thromboembolism occur.  Patient verbalized understanding by repeating back information and was advised to contact me if further medication-related questions arise. Patient was also provided an information handout.  Follow-up Return in about 4 days (around 03/22/2018).    Charlene Brooke, PharmD PGY1 Pharmacy Resident Phone: [phone].

## 2018-03-22 ENCOUNTER — Encounter: Payer: Self-pay | Admitting: Pharmacist

## 2018-03-22 DIAGNOSIS — D6851 Activated protein C resistance: Secondary | ICD-10-CM

## 2018-03-22 DIAGNOSIS — D689 Coagulation defect, unspecified: Secondary | ICD-10-CM

## 2018-03-22 DIAGNOSIS — Z7901 Long term (current) use of anticoagulants: Secondary | ICD-10-CM

## 2018-03-22 DIAGNOSIS — I82629 Acute embolism and thrombosis of deep veins of unspecified upper extremity: Secondary | ICD-10-CM

## 2018-03-22 LAB — POCT INR: INR: 2.2 (ref 2.0–3.0)

## 2018-03-22 NOTE — Progress Notes (Signed)
Reviewed thx DrG 

## 2018-03-22 NOTE — Progress Notes (Signed)
Anticoagulation Management Jessica H Barkeris a 57 y.o.femalewho was contactedfor monitoring of warfarintreatment. Patient has a home INR meter through [facility].  Indication: DVThistory, Factor V Leiden mutation Duration: indefinite Supervising physician: Murriel Hopper  Anticoagulation Clinic Visit History: Patientdoes notreport signs/symptoms of bleeding or thromboembolism or other changes  Anticoagulation Episode Summary    Current INR goal:   2.0-3.0  TTR:   71.9 % (5.9 y)  Next INR check:   03/29/2018  INR from last check:   2.2 (03/22/2018)  Weekly max warfarin dose:     Target end date:   04/11/2015  INR check location:     Preferred lab:     Send INR reminders to:   [facility]   Indications   DVT of upper extremity (deep vein thrombosis) ([place]) [I82.629] Factor V Leiden mutation ([place]) [D68.51] Coagulopathy ([place]) [D68.9] Chronic anticoagulation [Z79.01]       Comments:         Anticoagulation Care Providers    Provider Role Specialty Phone number   Annia Belt, MD Referring Oncology [phone]     Allergies  Allergen Reactions  . Amoxicillin-Pot Clavulanate Rash  . Ceftin Rash  . Cefuroxime Rash  . Cefuroxime Axetil Rash  . Risperidone And Related Other (See Comments)    Has medical contraindications   Medication Sig  acetaminophen (TYLENOL) 500 MG tablet Take 1,000 mg by mouth as needed.  alprazolam (XANAX) 2 MG tablet Take 2 mg by mouth 2 (two) times daily as needed (May take a 1 mg during the day but always takes 2 mg at bedtime.). For anxiety  anastrozole (ARIMIDEX) 1 MG tablet Take 1 mg by mouth daily.    cyanocobalamin (,VITAMIN B-12,) 1000 MCG/ML injection Inject 100 mcg into the muscle every 30 (thirty) days.   docusate sodium (STOOL SOFTENER) 100 MG capsule Take 2 capsules (200 mg total) by mouth at bedtime.  enoxaparin (LOVENOX) 150 MG/ML injection Inject 1 mL (150 mg total) into the skin daily.  esomeprazole (NEXIUM) 40 MG  capsule Take 40 mg by mouth 2 (two) times daily before a meal.  fluticasone (FLONASE) 50 MCG/ACT nasal spray Place 2 sprays into the nose daily.   levocetirizine (XYZAL) 5 MG tablet TAKE ONE TABLET BY MOUTH AT BEDTIME.  lidocaine (LIDODERM) 5 % Place 1 patch onto the skin as needed. Remove & Discard patch within 12 hours or as directed by MD (only uses prn)  metoprolol (TOPROL-XL) 100 MG 24 hr tablet Take 100 mg by mouth daily.    omega-3 fish oil (MAXEPA) 1000 MG CAPS capsule Take 1 capsule (1,000 mg total) by mouth 2 (two) times daily.  promethazine (PHENERGAN) 25 MG tablet Take 25 mg by mouth as needed for nausea.   simvastatin (ZOCOR) 20 MG tablet Take 20 mg by mouth daily.   warfarin (COUMADIN) 7.5 MG tablet Take 1 tablet (7.5 mg total) by mouth daily. Except 1/2 tablet on Tue/Thur   Past Medical History:  Diagnosis Date  . Abnormal Pap smear   . Acid reflux   . Bipolar disorder ([place]) 03/22/2013  . Breast cancer ([place])   . Breast disorder    cancer  . Chronic anticoagulation 03/22/2013  . Coagulopathy ([place]) 04/10/2012  . Constipation   . DVT of upper extremity (deep vein thrombosis) ([place]) 04/10/2012  . Factor V Leiden ([place])   . Factor V Leiden mutation ([place]) 04/10/2012  . Family history of ovarian cancer   . Fatty liver 06/04/2017  . Fibroid   .  Gallstones   . Heart murmur   . High cholesterol   . History of abnormal cervical Pap smear 01/06/2015  . History of breast cancer 01/06/2015  . HTN (hypertension)   . HX: breast cancer   . Hypertension 12/31/2012  . Kidney stones   . Kidney stones 06/04/2017  . Lobular carcinoma of breast, estrogen receptor positive, 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  . Mental disorder    panic attacks  . Osteoarthritis   . Pain in both knees   . Vaginal Pap smear, abnormal    Social History   Socioeconomic History  . Marital status: Divorced    Spouse name: Not on file  . Number of children: Not on file  . Years of education: Not on file  . Highest education level:  Not on file  Occupational History  . Not on file  Social Needs  . Financial resource strain: Not on file  . Food insecurity:    Worry: Not on file    Inability: Not on file  . Transportation needs:    Medical: Not on file    Non-medical: Not on file  Tobacco Use  . Smoking status: Never Smoker  . Smokeless tobacco: Never Used  Substance and Sexual Activity  . Alcohol use: Yes    Alcohol/week: 0.0 oz    Comment: 07-25-2016 per pt rarely  . Drug use: No    Comment: 07-25-2016 Per pt no   . Sexual activity: Never    Birth control/protection: Surgical    Comment: partial hyst  Lifestyle  . Physical activity:    Days per week: Not on file    Minutes per session: Not on file  . Stress: Not on file  Relationships  . Social connections:    Talks on phone: Not on file    Gets together: Not on file    Attends religious service: Not on file    Active member of club or organization: Not on file    Attends meetings of clubs or organizations: Not on file    Relationship status: Not on file  Other Topics Concern  . Not on file  Social History Narrative  . Not on file   Family History  Problem Relation Age of Onset  . Alcohol abuse Mother   . COPD Mother   . Alcohol abuse Father   . Heart disease Father   . Pneumonia Father   . Diabetes Father   . Colon cancer Father        Dx 56s; deceased 38s  . OCD Daughter   . Bipolar disorder Daughter   . Hypertension Daughter   . Schizophrenia Daughter   . Schizophrenia Daughter   . Bipolar disorder Daughter   . OCD Daughter   . Hypertension Daughter   . COPD Daughter   . Other Daughter        stomach don't empty out  . ADD / ADHD Grandchild   . Anxiety disorder Grandchild   . Depression Grandchild   . Healthy Sister   . Cancer Brother        leukemia 49s; deceased 58  . Heart disease Brother   . Cancer Brother        liver; currently 60  . Cirrhosis Brother   . Other Brother        waiting for liver transplant  .  Alzheimer's disease Paternal Grandmother   . Ovarian cancer Maternal Grandmother 60       deceased 73  . Cancer  Maternal Uncle        3 of 6 with various cancers: skin, NHL, Hodkin's Dx  . Breast cancer Cousin 50       mat first cousin related through unaffected aunt   ASSESSMENT Recent Results: Lab Results  Component Value Date   INR 2.2 03/22/2018   INR 1.3 (A) 03/18/2018   INR 2.8 02/14/2018   PROTIME 27.6 (H) 07/30/2013   Anticoagulation Dosing: Description   DR [name]     INR today: Therapeutic  PLAN Weekly dose was unchanged, enoxaparin was discontinued  There are no Patient Instructions on file for this visit. Patient advised to contact clinic or seek medical attention if signs/symptoms of bleeding or thromboembolism occur.  Patient verbalized understanding by repeating back information and was advised to contact me if further medication-related questions arise. Patient was also provided an information handout.  Follow-up Return in about 1 week (around 03/29/2018).  [name]

## 2018-03-28 ENCOUNTER — Ambulatory Visit (INDEPENDENT_AMBULATORY_CARE_PROVIDER_SITE_OTHER): Payer: PPO | Admitting: Otolaryngology

## 2018-03-28 DIAGNOSIS — J310 Chronic rhinitis: Secondary | ICD-10-CM

## 2018-03-28 DIAGNOSIS — H6123 Impacted cerumen, bilateral: Secondary | ICD-10-CM

## 2018-03-28 DIAGNOSIS — R0982 Postnasal drip: Secondary | ICD-10-CM | POA: Diagnosis not present

## 2018-03-28 DIAGNOSIS — H9202 Otalgia, left ear: Secondary | ICD-10-CM | POA: Diagnosis not present

## 2018-04-01 ENCOUNTER — Encounter: Payer: Self-pay | Admitting: Pharmacist

## 2018-04-01 DIAGNOSIS — D6851 Activated protein C resistance: Secondary | ICD-10-CM

## 2018-04-01 DIAGNOSIS — Z7901 Long term (current) use of anticoagulants: Secondary | ICD-10-CM

## 2018-04-01 DIAGNOSIS — D689 Coagulation defect, unspecified: Secondary | ICD-10-CM

## 2018-04-01 DIAGNOSIS — I82629 Acute embolism and thrombosis of deep veins of unspecified upper extremity: Secondary | ICD-10-CM

## 2018-04-01 LAB — POCT INR: INR: 3 (ref 2.0–3.0)

## 2018-04-01 NOTE — Progress Notes (Signed)
Anticoagulation Management Jessica Reilly a 57 y.o.femalewho was contactedfor monitoring of warfarintreatment. Patient has a home INR meter through [facility].  Indication: DVThistory, Factor V Leiden mutation Duration: indefinite Supervising physician: Murriel Hopper  Anticoagulation Clinic Visit History: Patientdoes notreport signs/symptoms of bleeding or thromboembolism or other changes  Anticoagulation Episode Summary    Current INR goal:   2.0-3.0  TTR:   72.1 % (6 y)  Next INR check:   04/08/2018  INR from last check:   3.0 (04/01/2018)  Weekly max warfarin dose:     Target end date:   04/11/2015  INR check location:     Preferred lab:     Send INR reminders to:   [facility]   Indications   DVT of upper extremity (deep vein thrombosis) (HCC) [I82.629] Factor V Leiden mutation ([place]) [D68.51] Coagulopathy ([place]) [D68.9] Chronic anticoagulation [Z79.01]       Comments:         Anticoagulation Care Providers    Provider Role Specialty Phone number   Annia Belt, MD Referring Oncology [phone]     Allergies  Allergen Reactions  . Amoxicillin-Pot Clavulanate Rash  . Ceftin Rash  . Cefuroxime Rash  . Cefuroxime Axetil Rash  . Risperidone And Related Other (See Comments)    Has medical contraindications   Medication Sig  acetaminophen (TYLENOL) 500 MG tablet Take 1,000 mg by mouth as needed.  alprazolam (XANAX) 2 MG tablet Take 2 mg by mouth 2 (two) times daily as needed (May take a 1 mg during the day but always takes 2 mg at bedtime.). For anxiety  anastrozole (ARIMIDEX) 1 MG tablet Take 1 mg by mouth daily.    cyanocobalamin (,VITAMIN B-12,) 1000 MCG/ML injection Inject 100 mcg into the muscle every 30 (thirty) days.   docusate sodium (STOOL SOFTENER) 100 MG capsule Take 2 capsules (200 mg total) by mouth at bedtime.  enoxaparin (LOVENOX) 150 MG/ML injection Inject 1 mL (150 mg total) into the skin daily.  esomeprazole (NEXIUM) 40 MG  capsule Take 40 mg by mouth 2 (two) times daily before a meal.  fluticasone (FLONASE) 50 MCG/ACT nasal spray Place 2 sprays into the nose daily.   levocetirizine (XYZAL) 5 MG tablet TAKE ONE TABLET BY MOUTH AT BEDTIME.  lidocaine (LIDODERM) 5 % Place 1 patch onto the skin as needed. Remove & Discard patch within 12 hours or as directed by MD (only uses prn)  metoprolol (TOPROL-XL) 100 MG 24 hr tablet Take 100 mg by mouth daily.    omega-3 fish oil (MAXEPA) 1000 MG CAPS capsule Take 1 capsule (1,000 mg total) by mouth 2 (two) times daily.  promethazine (PHENERGAN) 25 MG tablet Take 25 mg by mouth as needed for nausea.   simvastatin (ZOCOR) 20 MG tablet Take 20 mg by mouth daily.   warfarin (COUMADIN) 7.5 MG tablet Take 1 tablet (7.5 mg total) by mouth daily. Except 1/2 tablet on Tue/Thur   Past Medical History:  Diagnosis Date  . Abnormal Pap smear   . Acid reflux   . Bipolar disorder ([place]) 03/22/2013  . Breast cancer ([place])   . Breast disorder    cancer  . Chronic anticoagulation 03/22/2013  . Coagulopathy ([place]) 04/10/2012  . Constipation   . DVT of upper extremity (deep vein thrombosis) ([place]) 04/10/2012  . Factor V Leiden ([place])   . Factor V Leiden mutation ([place]) 04/10/2012  . Family history of ovarian cancer   . Fatty liver 06/04/2017  . Fibroid   .  Gallstones   . Heart murmur   . High cholesterol   . History of abnormal cervical Pap smear 01/06/2015  . History of breast cancer 01/06/2015  . HTN (hypertension)   . HX: breast cancer   . Hypertension 12/31/2012  . Kidney stones   . Kidney stones 06/04/2017  . Lobular carcinoma of breast, estrogen receptor positive, 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  . Mental disorder    panic attacks  . Osteoarthritis   . Pain in both knees   . Vaginal Pap smear, abnormal    Social History   Socioeconomic History  . Marital status: Divorced    Spouse name: Not on file  . Number of children: Not on file  . Years of education: Not on file  . Highest education level:  Not on file  Occupational History  . Not on file  Social Needs  . Financial resource strain: Not on file  . Food insecurity:    Worry: Not on file    Inability: Not on file  . Transportation needs:    Medical: Not on file    Non-medical: Not on file  Tobacco Use  . Smoking status: Never Smoker  . Smokeless tobacco: Never Used  Substance and Sexual Activity  . Alcohol use: Yes    Alcohol/week: 0.0 oz    Comment: 07-25-2016 per pt rarely  . Drug use: No    Comment: 07-25-2016 Per pt no   . Sexual activity: Never    Birth control/protection: Surgical    Comment: partial hyst  Lifestyle  . Physical activity:    Days per week: Not on file    Minutes per session: Not on file  . Stress: Not on file  Relationships  . Social connections:    Talks on phone: Not on file    Gets together: Not on file    Attends religious service: Not on file    Active member of club or organization: Not on file    Attends meetings of clubs or organizations: Not on file    Relationship status: Not on file  Other Topics Concern  . Not on file  Social History Narrative  . Not on file   Family History  Problem Relation Age of Onset  . Alcohol abuse Mother   . COPD Mother   . Alcohol abuse Father   . Heart disease Father   . Pneumonia Father   . Diabetes Father   . Colon cancer Father        Dx 56s; deceased 38s  . OCD Daughter   . Bipolar disorder Daughter   . Hypertension Daughter   . Schizophrenia Daughter   . Schizophrenia Daughter   . Bipolar disorder Daughter   . OCD Daughter   . Hypertension Daughter   . COPD Daughter   . Other Daughter        stomach don't empty out  . ADD / ADHD Grandchild   . Anxiety disorder Grandchild   . Depression Grandchild   . Healthy Sister   . Cancer Brother        leukemia 49s; deceased 58  . Heart disease Brother   . Cancer Brother        liver; currently 60  . Cirrhosis Brother   . Other Brother        waiting for liver transplant  .  Alzheimer's disease Paternal Grandmother   . Ovarian cancer Maternal Grandmother 60       deceased 73  . Cancer  Maternal Uncle        3 of 6 with various cancers: skin, NHL, Hodkin's Dx  . Breast cancer Cousin 56       mat first cousin related through unaffected aunt   ASSESSMENT Recent Results: Lab Results  Component Value Date   INR 3.0 04/01/2018   INR 2.2 03/22/2018   INR 1.3 (A) 03/18/2018   PROTIME 27.6 (H) 07/30/2013   Anticoagulation Dosing: Description   DR [name]     INR today: Therapeutic  PLAN Weekly dose was unchanged  There are no Patient Instructions on file for this visit. Patient advised to contact clinic or seek medical attention if signs/symptoms of bleeding or thromboembolism occur.  Patient verbalized understanding by repeating back information and was advised to contact me if further medication-related questions arise. Patient was also provided an information handout.  Follow-up Return in about 1 week (around 04/08/2018).  [name]

## 2018-04-01 NOTE — Progress Notes (Signed)
Reviewed thx DrG 

## 2018-04-03 ENCOUNTER — Ambulatory Visit (INDEPENDENT_AMBULATORY_CARE_PROVIDER_SITE_OTHER): Payer: Medicare Other | Admitting: Physical Medicine and Rehabilitation

## 2018-04-25 ENCOUNTER — Ambulatory Visit (INDEPENDENT_AMBULATORY_CARE_PROVIDER_SITE_OTHER): Payer: PPO | Admitting: Student-PharmD

## 2018-04-25 DIAGNOSIS — D689 Coagulation defect, unspecified: Secondary | ICD-10-CM

## 2018-04-25 DIAGNOSIS — I82629 Acute embolism and thrombosis of deep veins of unspecified upper extremity: Secondary | ICD-10-CM

## 2018-04-25 DIAGNOSIS — D6851 Activated protein C resistance: Secondary | ICD-10-CM | POA: Diagnosis not present

## 2018-04-25 DIAGNOSIS — Z7901 Long term (current) use of anticoagulants: Secondary | ICD-10-CM | POA: Diagnosis not present

## 2018-04-25 DIAGNOSIS — I82622 Acute embolism and thrombosis of deep veins of left upper extremity: Secondary | ICD-10-CM

## 2018-04-25 DIAGNOSIS — I82623 Acute embolism and thrombosis of deep veins of upper extremity, bilateral: Secondary | ICD-10-CM

## 2018-04-25 DIAGNOSIS — Z5181 Encounter for therapeutic drug level monitoring: Secondary | ICD-10-CM | POA: Diagnosis not present

## 2018-04-25 LAB — POCT INR: INR: 3.7 — AB (ref 2.0–3.0)

## 2018-04-25 NOTE — Progress Notes (Signed)
Anticoagulation Management Jessica Reilly is a 57 y.o. female who reports to the clinic for monitoring of warfarin treatment.    Indication: DVThistory, Factor V Leiden mutation Duration: indefinite Supervising physician: Murriel Hopper   Anticoagulation Clinic Visit History: Patient does not report signs/symptoms of bleeding or thromboembolism or other changes.  Anticoagulation Episode Summary    Current INR goal:   2.0-3.0  TTR:   71.3 % (6 y)  Next INR check:   05/02/2018  INR from last check:     Weekly max warfarin dose:     Target end date:   04/11/2015  INR check location:     Preferred lab:     Send INR reminders to:   [facility]   Indications   DVT of upper extremity (deep vein thrombosis) (HCC) [I82.629] Factor V Leiden mutation ([place]) [D68.51] Coagulopathy ([place]) [D68.9] Chronic anticoagulation [Z79.01]       Comments:         Anticoagulation Care Providers    Provider Role Specialty Phone number   Annia Belt, MD Referring Oncology [phone]      Allergies  Allergen Reactions  . Amoxicillin-Pot Clavulanate Rash  . Ceftin Rash  . Cefuroxime Rash  . Cefuroxime Axetil Rash  . Risperidone And Related Other (See Comments)    Has medical contraindications   Prior to Admission medications   Medication Sig Start Date End Date Taking? Authorizing Provider  acetaminophen (TYLENOL) 500 MG tablet Take 1,000 mg by mouth as needed.    [provider]  alprazolam Duanne Moron) 2 MG tablet Take 2 mg by mouth 2 (two) times daily as needed (May take a 1 mg during the day but always takes 2 mg at bedtime.). For anxiety    [provider]  anastrozole (ARIMIDEX) 1 MG tablet Take 1 mg by mouth daily.      [provider]  cyanocobalamin (,VITAMIN B-12,) 1000 MCG/ML injection Inject 100 mcg into the muscle every 30 (thirty) days.     [provider]  docusate sodium (STOOL SOFTENER) 100 MG capsule Take 2 capsules (200 mg  total) by mouth at bedtime. 06/25/12   Rehman, Mechele Dawley, MD  enoxaparin (LOVENOX) 150 MG/ML injection Inject 1 mL (150 mg total) into the skin daily. 03/18/18   Annia Belt, MD  esomeprazole (NEXIUM) 40 MG capsule Take 40 mg by mouth 2 (two) times daily before a meal.    [provider]  fluticasone (FLONASE) 50 MCG/ACT nasal spray Place 2 sprays into the nose daily.     [provider]  levocetirizine (XYZAL) 5 MG tablet TAKE ONE TABLET BY MOUTH AT BEDTIME. 03/18/14   Derrek Monaco A, NP  lidocaine (LIDODERM) 5 % Place 1 patch onto the skin as needed. Remove & Discard patch within 12 hours or as directed by MD (only uses prn)    [provider]  metoprolol (TOPROL-XL) 100 MG 24 hr tablet Take 100 mg by mouth daily.      [provider]  omega-3 fish oil (MAXEPA) 1000 MG CAPS capsule Take 1 capsule (1,000 mg total) by mouth 2 (two) times daily. 11/28/17   Forde Dandy, PharmD  promethazine (PHENERGAN) 25 MG tablet Take 25 mg by mouth as needed for nausea.  03/10/13   [provider]  simvastatin (ZOCOR) 20 MG tablet Take 20 mg by mouth daily.     [provider]  warfarin (COUMADIN) 7.5 MG tablet Take 1 tablet (7.5 mg total)  by mouth daily. Except 1/2 tablet on Tue/Thur 02/14/18   Annia Belt, MD   Past Medical History:  Diagnosis Date  . Abnormal Pap smear   . Acid reflux   . Bipolar disorder ([place]) 03/22/2013  . Breast cancer ([place])   . Breast disorder    cancer  . Chronic anticoagulation 03/22/2013  . Coagulopathy ([place]) 04/10/2012  . Constipation   . DVT of upper extremity (deep vein thrombosis) ([place]) 04/10/2012  . Factor V Leiden ([place])   . Factor V Leiden mutation ([place]) 04/10/2012  . Family history of ovarian cancer   . Fatty liver 06/04/2017  . Fibroid   . Gallstones   . Heart murmur   . High cholesterol   . History of abnormal cervical Pap smear 01/06/2015  . History of breast cancer 01/06/2015  . HTN (hypertension)    . HX: breast cancer   . Hypertension 12/31/2012  . Kidney stones   . Kidney stones 06/04/2017  . Lobular carcinoma of breast, estrogen receptor positive, 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  . Mental disorder    panic attacks  . Osteoarthritis   . Pain in both knees   . Vaginal Pap smear, abnormal    Social History   Socioeconomic History  . Marital status: Divorced    Spouse name: Not on file  . Number of children: Not on file  . Years of education: Not on file  . Highest education level: Not on file  Occupational History  . Not on file  Social Needs  . Financial resource strain: Not on file  . Food insecurity:    Worry: Not on file    Inability: Not on file  . Transportation needs:    Medical: Not on file    Non-medical: Not on file  Tobacco Use  . Smoking status: Never Smoker  . Smokeless tobacco: Never Used  Substance and Sexual Activity  . Alcohol use: Yes    Alcohol/week: 0.0 oz    Comment: 07-25-2016 per pt rarely  . Drug use: No    Comment: 07-25-2016 Per pt no   . Sexual activity: Never    Birth control/protection: Surgical    Comment: partial hyst  Lifestyle  . Physical activity:    Days per week: Not on file    Minutes per session: Not on file  . Stress: Not on file  Relationships  . Social connections:    Talks on phone: Not on file    Gets together: Not on file    Attends religious service: Not on file    Active member of club or organization: Not on file    Attends meetings of clubs or organizations: Not on file    Relationship status: Not on file  Other Topics Concern  . Not on file  Social History Narrative  . Not on file   Family History  Problem Relation Age of Onset  . Alcohol abuse Mother   . COPD Mother   . Alcohol abuse Father   . Heart disease Father   . Pneumonia Father   . Diabetes Father   . Colon cancer Father        Dx 38s; deceased 61s  . OCD Daughter   . Bipolar disorder Daughter   . Hypertension Daughter   . Schizophrenia Daughter    . Schizophrenia Daughter   . Bipolar disorder Daughter   . OCD Daughter   . Hypertension Daughter   . COPD Daughter   . Other Daughter  stomach don't empty out  . ADD / ADHD Grandchild   . Anxiety disorder Grandchild   . Depression Grandchild   . Healthy Sister   . Cancer Brother        leukemia 55s; deceased 86  . Heart disease Brother   . Cancer Brother        liver; currently 5  . Cirrhosis Brother   . Other Brother        waiting for liver transplant  . Alzheimer's disease Paternal Grandmother   . Ovarian cancer Maternal Grandmother 37       deceased 81  . Cancer Maternal Uncle        3 of 6 with various cancers: skin, NHL, Hodkin's Dx  . Breast cancer Cousin 17       mat first cousin related through unaffected aunt    ASSESSMENT Recent Results: The most recent result is correlated with 52.5 mg per week (7.5 mg daily): Lab Results  Component Value Date   INR 3.7 (A) 04/25/2018   INR 3.0 04/01/2018   INR 2.2 03/22/2018   PROTIME 27.6 (H) 07/30/2013    Anticoagulation Dosing: Description   DR [name]     INR today: Supratherapeutic  PLAN Weekly dose was decreased by 14% to 45 mg per week. Plan to take 7.5 mg daily except for 1/2 tablet (3.75 mg) every Tuesday and Thursday.   Patient had already taken 7.5 mg dose today (Thursday), so first dose decrease will be on Tuesday, 7/23. Patient encouraged to try the twice weekly half tablet on Mondays and Fridays instead for more immediate action, but states that she prefers Tuesdays and Thursdays because it is easier to remember the two Ts. Patient advised that as long as there are no signs of bruising/bleeding, this plan is appropriate.  Patient advised to contact clinic or seek medical attention if signs/symptoms of bleeding or thromboembolism occur.  Patient verbalized understanding by repeating back information and was advised to contact me if further medication-related questions  arise.  Follow-up 05/02/18  Oralia Manis  PharmD Candidate

## 2018-04-26 MED ORDER — WARFARIN SODIUM 7.5 MG PO TABS: 7.5000 mg | ORAL_TABLET | Freq: Every day | ORAL | 6 refills | Status: DC

## 2018-04-29 LAB — PROTIME-INR

## 2018-05-10 ENCOUNTER — Encounter: Payer: Self-pay | Admitting: Pharmacist

## 2018-05-10 DIAGNOSIS — D689 Coagulation defect, unspecified: Secondary | ICD-10-CM

## 2018-05-10 DIAGNOSIS — D6851 Activated protein C resistance: Secondary | ICD-10-CM

## 2018-05-10 DIAGNOSIS — I82629 Acute embolism and thrombosis of deep veins of unspecified upper extremity: Secondary | ICD-10-CM

## 2018-05-10 DIAGNOSIS — Z7901 Long term (current) use of anticoagulants: Secondary | ICD-10-CM

## 2018-05-10 LAB — POCT INR: INR: 2.6 (ref 2.0–3.0)

## 2018-05-10 LAB — PROTIME-INR

## 2018-05-10 NOTE — Progress Notes (Addendum)
Anticoagulation Management Jessica Reilly is a 57 y.o. female who contacted clinical pharmacist for warfarin management. Patient has a home INR meter   Indication: DVThistory, Factor V Leiden mutation Duration: indefinite Supervising physician: [place] Clinic Visit History: Patient does not report signs/symptoms of bleeding or thromboembolism or other changes.  Anticoagulation Episode Summary    Current INR goal:   2.0-3.0  TTR:   71.0 % (6.1 y)  Next INR check:   05/23/2018  INR from last check:   2.6 (05/10/2018)  Weekly max warfarin dose:     Target end date:   04/11/2015  INR check location:     Preferred lab:     Send INR reminders to:   [facility]   Indications   DVT of upper extremity (deep vein thrombosis) (HCC) [I82.629] Factor V Leiden mutation ([place]) [D68.51] Coagulopathy ([place]) [D68.9] Chronic anticoagulation [Z79.01]       Comments:         Anticoagulation Care Providers    Provider Role Specialty Phone number   Annia Belt, MD Referring Oncology [phone]     Allergies  Allergen Reactions  . Amoxicillin-Pot Clavulanate Rash  . Ceftin Rash  . Cefuroxime Rash  . Cefuroxime Axetil Rash  . Risperidone And Related Other (See Comments)    Has medical contraindications   Medication Sig  acetaminophen (TYLENOL) 500 MG tablet Take 1,000 mg by mouth as needed.  alprazolam (XANAX) 2 MG tablet Take 2 mg by mouth 2 (two) times daily as needed (May take a 1 mg during the day but always takes 2 mg at bedtime.). For anxiety  anastrozole (ARIMIDEX) 1 MG tablet Take 1 mg by mouth daily.    cyanocobalamin (,VITAMIN B-12,) 1000 MCG/ML injection Inject 100 mcg into the muscle every 30 (thirty) days.   docusate sodium (STOOL SOFTENER) 100 MG capsule Take 2 capsules (200 mg total) by mouth at bedtime.  enoxaparin (LOVENOX) 150 MG/ML injection Inject 1 mL (150 mg total) into the skin daily.  esomeprazole (NEXIUM) 40 MG capsule  Take 40 mg by mouth 2 (two) times daily before a meal.  fluticasone (FLONASE) 50 MCG/ACT nasal spray Place 2 sprays into the nose daily.   levocetirizine (XYZAL) 5 MG tablet TAKE ONE TABLET BY MOUTH AT BEDTIME.  lidocaine (LIDODERM) 5 % Place 1 patch onto the skin as needed. Remove & Discard patch within 12 hours or as directed by MD (only uses prn)  metoprolol (TOPROL-XL) 100 MG 24 hr tablet Take 100 mg by mouth daily.    omega-3 fish oil (MAXEPA) 1000 MG CAPS capsule Take 1 capsule (1,000 mg total) by mouth 2 (two) times daily.  promethazine (PHENERGAN) 25 MG tablet Take 25 mg by mouth as needed for nausea.   simvastatin (ZOCOR) 20 MG tablet Take 20 mg by mouth daily.   warfarin (COUMADIN) 7.5 MG tablet Take 1 tablet (7.5 mg total) by mouth daily. Except 1/2 tablet on Tue/Thur   Past Medical History:  Diagnosis Date  . Abnormal Pap smear   . Acid reflux   . Bipolar disorder ([place]) 03/22/2013  . Breast cancer ([place])   . Breast disorder    cancer  . Chronic anticoagulation 03/22/2013  . Coagulopathy ([place]) 04/10/2012  . Constipation   . DVT of upper extremity (deep vein thrombosis) ([place]) 04/10/2012  . Factor V Leiden ([place])   . Factor V Leiden mutation ([place]) 04/10/2012  . Family history of ovarian cancer   . Fatty liver 06/04/2017  .  Fibroid   . Gallstones   . Heart murmur   . High cholesterol   . History of abnormal cervical Pap smear 01/06/2015  . History of breast cancer 01/06/2015  . HTN (hypertension)   . HX: breast cancer   . Hypertension 12/31/2012  . Kidney stones   . Kidney stones 06/04/2017  . Lobular carcinoma of breast, estrogen receptor positive, 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  . Mental disorder    panic attacks  . Osteoarthritis   . Pain in both knees   . Vaginal Pap smear, abnormal    Social History   Socioeconomic History  . Marital status: Divorced    Spouse name: Not on file  . Number of children: Not on file  . Years of education: Not on file  . Highest education level: Not on  file  Occupational History  . Not on file  Social Needs  . Financial resource strain: Not on file  . Food insecurity:    Worry: Not on file    Inability: Not on file  . Transportation needs:    Medical: Not on file    Non-medical: Not on file  Tobacco Use  . Smoking status: Never Smoker  . Smokeless tobacco: Never Used  Substance and Sexual Activity  . Alcohol use: Yes    Alcohol/week: 0.0 oz    Comment: 07-25-2016 per pt rarely  . Drug use: No    Comment: 07-25-2016 Per pt no   . Sexual activity: Never    Birth control/protection: Surgical    Comment: partial hyst  Lifestyle  . Physical activity:    Days per week: Not on file    Minutes per session: Not on file  . Stress: Not on file  Relationships  . Social connections:    Talks on phone: Not on file    Gets together: Not on file    Attends religious service: Not on file    Active member of club or organization: Not on file    Attends meetings of clubs or organizations: Not on file    Relationship status: Not on file  Other Topics Concern  . Not on file  Social History Narrative  . Not on file   Family History  Problem Relation Age of Onset  . Alcohol abuse Mother   . COPD Mother   . Alcohol abuse Father   . Heart disease Father   . Pneumonia Father   . Diabetes Father   . Colon cancer Father        Dx 11s; deceased 35s  . OCD Daughter   . Bipolar disorder Daughter   . Hypertension Daughter   . Schizophrenia Daughter   . Schizophrenia Daughter   . Bipolar disorder Daughter   . OCD Daughter   . Hypertension Daughter   . COPD Daughter   . Other Daughter        stomach don't empty out  . ADD / ADHD Grandchild   . Anxiety disorder Grandchild   . Depression Grandchild   . Healthy Sister   . Cancer Brother        leukemia 43s; deceased 51  . Heart disease Brother   . Cancer Brother        liver; currently 47  . Cirrhosis Brother   . Other Brother        waiting for liver transplant  . Alzheimer's  disease Paternal Grandmother   . Ovarian cancer Maternal Grandmother 24       deceased  43  . Cancer Maternal Uncle        3 of 6 with various cancers: skin, NHL, Hodkin's Dx  . Breast cancer Cousin 71       mat first cousin related through unaffected aunt   ASSESSMENT Lab Results  Component Value Date   INR 2.6 05/10/2018   INR 3.7 (A) 04/25/2018   INR 3.0 04/01/2018   PROTIME 27.6 (H) 07/30/2013   Anticoagulation Dosing: Description   DR [name]     INR today: Therapeutic  PLAN Weekly dose was unchanged  There are no Patient Instructions on file for this visit. Patient advised to contact clinic or seek medical attention if signs/symptoms of bleeding or thromboembolism occur.  Patient verbalized understanding by repeating back information and was advised to contact me if further medication-related questions arise. Patient was also provided an information handout.  Follow-up Return in about 2 weeks (around 05/24/2018).  [name]

## 2018-05-10 NOTE — Progress Notes (Signed)
Reviewed thx DrG 

## 2018-05-27 ENCOUNTER — Other Ambulatory Visit: Payer: Medicare Other | Admitting: Adult Health

## 2018-05-31 LAB — POCT INR: INR: 2.5 (ref 2.0–3.0)

## 2018-06-03 ENCOUNTER — Telehealth: Payer: Self-pay | Admitting: *Deleted

## 2018-06-03 NOTE — Telephone Encounter (Signed)
Received fax from [facility] of patient's INR History from Oct 16, 2017 - May 31, 2018. Patient being followed by Dr. Maudie Mercury. Placed in Dr. Julianne Rice box. Hubbard Hartshorn, RN, BSN

## 2018-06-04 ENCOUNTER — Encounter: Payer: Self-pay | Admitting: Student-PharmD

## 2018-06-04 DIAGNOSIS — I82629 Acute embolism and thrombosis of deep veins of unspecified upper extremity: Secondary | ICD-10-CM

## 2018-06-04 DIAGNOSIS — Z7901 Long term (current) use of anticoagulants: Secondary | ICD-10-CM

## 2018-06-04 DIAGNOSIS — D6851 Activated protein C resistance: Secondary | ICD-10-CM

## 2018-06-04 DIAGNOSIS — D689 Coagulation defect, unspecified: Secondary | ICD-10-CM

## 2018-06-04 NOTE — Progress Notes (Signed)
Anticoagulation Management Jessica Reilly is a 57 y.o. female who reports to the clinic for monitoring of warfarin treatment.    Indication: DVT history, Factor V Leiden mutation Duration: indefinite Supervising physician: Murriel Hopper  Anticoagulation Clinic Visit History: Patient does not report signs/symptoms of bleeding or thromboembolism or other changes.  Anticoagulation Episode Summary    Current INR goal:   2.0-3.0  TTR:   71.4 % (6.1 y)  Next INR check:   06/04/2018  INR from last check:   2.5 (06/04/2018)  Weekly max warfarin dose:     Target end date:   04/11/2015  INR check location:     Preferred lab:     Send INR reminders to:   [facility]   Indications   DVT of upper extremity (deep vein thrombosis) (HCC) [I82.629] Factor V Leiden mutation ([place]) [D68.51] Coagulopathy ([place]) [D68.9] Chronic anticoagulation [Z79.01]       Comments:         Anticoagulation Care Providers    Provider Role Specialty Phone number   Annia Belt, MD Referring Oncology [phone]     Allergies  Allergen Reactions  . Amoxicillin-Pot Clavulanate Rash  . Ceftin Rash  . Cefuroxime Rash  . Cefuroxime Axetil Rash  . Risperidone And Related Other (See Comments)    Has medical contraindications   Prior to Admission medications   Medication Sig Start Date End Date Taking? Authorizing Provider  acetaminophen (TYLENOL) 500 MG tablet Take 1,000 mg by mouth as needed.    [provider]  alprazolam Duanne Moron) 2 MG tablet Take 2 mg by mouth 2 (two) times daily as needed (May take a 1 mg during the day but always takes 2 mg at bedtime.). For anxiety    [provider]  anastrozole (ARIMIDEX) 1 MG tablet Take 1 mg by mouth daily.      [provider]  cyanocobalamin (,VITAMIN B-12,) 1000 MCG/ML injection Inject 100 mcg into the muscle every 30 (thirty) days.     [provider]  docusate sodium (STOOL SOFTENER) 100 MG capsule Take 2  capsules (200 mg total) by mouth at bedtime. 06/25/12   Rehman, Mechele Dawley, MD  enoxaparin (LOVENOX) 150 MG/ML injection Inject 1 mL (150 mg total) into the skin daily. 03/18/18   Annia Belt, MD  esomeprazole (NEXIUM) 40 MG capsule Take 40 mg by mouth 2 (two) times daily before a meal.    [provider]  fluticasone (FLONASE) 50 MCG/ACT nasal spray Place 2 sprays into the nose daily.     [provider]  levocetirizine (XYZAL) 5 MG tablet TAKE ONE TABLET BY MOUTH AT BEDTIME. 03/18/14   Derrek Monaco A, NP  lidocaine (LIDODERM) 5 % Place 1 patch onto the skin as needed. Remove & Discard patch within 12 hours or as directed by MD (only uses prn)    [provider]  metoprolol (TOPROL-XL) 100 MG 24 hr tablet Take 100 mg by mouth daily.      [provider]  omega-3 fish oil (MAXEPA) 1000 MG CAPS capsule Take 1 capsule (1,000 mg total) by mouth 2 (two) times daily. 11/28/17   Forde Dandy, PharmD  promethazine (PHENERGAN) 25 MG tablet Take 25 mg by mouth as needed for nausea.  03/10/13   [provider]  simvastatin (ZOCOR) 20 MG tablet Take 20 mg by mouth daily.     [provider]  warfarin (COUMADIN) 7.5 MG tablet Take 1 tablet (7.5 mg total)  by mouth daily. Except 1/2 tablet on Tue/Thur 04/26/18   Annia Belt, MD   Past Medical History:  Diagnosis Date  . Abnormal Pap smear   . Acid reflux   . Bipolar disorder ([place]) 03/22/2013  . Breast cancer ([place])   . Breast disorder    cancer  . Chronic anticoagulation 03/22/2013  . Coagulopathy ([place]) 04/10/2012  . Constipation   . DVT of upper extremity (deep vein thrombosis) ([place]) 04/10/2012  . Factor V Leiden ([place])   . Factor V Leiden mutation ([place]) 04/10/2012  . Family history of ovarian cancer   . Fatty liver 06/04/2017  . Fibroid   . Gallstones   . Heart murmur   . High cholesterol   . History of abnormal cervical Pap smear 01/06/2015  . History of breast cancer 01/06/2015  . HTN  (hypertension)   . HX: breast cancer   . Hypertension 12/31/2012  . Kidney stones   . Kidney stones 06/04/2017  . Lobular carcinoma of breast, estrogen receptor positive, 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  . Mental disorder    panic attacks  . Osteoarthritis   . Pain in both knees   . Vaginal Pap smear, abnormal    Social History   Socioeconomic History  . Marital status: Divorced    Spouse name: Not on file  . Number of children: Not on file  . Years of education: Not on file  . Highest education level: Not on file  Occupational History  . Not on file  Social Needs  . Financial resource strain: Not on file  . Food insecurity:    Worry: Not on file    Inability: Not on file  . Transportation needs:    Medical: Not on file    Non-medical: Not on file  Tobacco Use  . Smoking status: Never Smoker  . Smokeless tobacco: Never Used  Substance and Sexual Activity  . Alcohol use: Yes    Alcohol/week: 0.0 standard drinks    Comment: 07-25-2016 per pt rarely  . Drug use: No    Comment: 07-25-2016 Per pt no   . Sexual activity: Never    Birth control/protection: Surgical    Comment: partial hyst  Lifestyle  . Physical activity:    Days per week: Not on file    Minutes per session: Not on file  . Stress: Not on file  Relationships  . Social connections:    Talks on phone: Not on file    Gets together: Not on file    Attends religious service: Not on file    Active member of club or organization: Not on file    Attends meetings of clubs or organizations: Not on file    Relationship status: Not on file  Other Topics Concern  . Not on file  Social History Narrative  . Not on file   Family History  Problem Relation Age of Onset  . Alcohol abuse Mother   . COPD Mother   . Alcohol abuse Father   . Heart disease Father   . Pneumonia Father   . Diabetes Father   . Colon cancer Father        Dx 16s; deceased 76s  . OCD Daughter   . Bipolar disorder Daughter   . Hypertension Daughter    . Schizophrenia Daughter   . Schizophrenia Daughter   . Bipolar disorder Daughter   . OCD Daughter   . Hypertension Daughter   . COPD Daughter   . Other Daughter  stomach don't empty out  . ADD / ADHD Grandchild   . Anxiety disorder Grandchild   . Depression Grandchild   . Healthy Sister   . Cancer Brother        leukemia 58s; deceased 20  . Heart disease Brother   . Cancer Brother        liver; currently 23  . Cirrhosis Brother   . Other Brother        waiting for liver transplant  . Alzheimer's disease Paternal Grandmother   . Ovarian cancer Maternal Grandmother 78       deceased 56  . Cancer Maternal Uncle        3 of 6 with various cancers: skin, NHL, Hodkin's Dx  . Breast cancer Cousin 8       mat first cousin related through unaffected aunt    ASSESSMENT Recent Results: Lab Results  Component Value Date   INR 2.5 06/04/2018   INR 2.6 05/10/2018   INR 3.7 (A) 04/25/2018   PROTIME 27.6 (H) 07/30/2013    Anticoagulation Dosing: Description   DR [name]     INR today: Therapeutic  PLAN Weekly dose was unchanged.   There are no Patient Instructions on file for this visit. Patient advised to contact clinic or seek medical attention if signs/symptoms of bleeding or thromboembolism occur.  Patient verbalized understanding by repeating back information and was advised to contact me if further medication-related questions arise.   Follow up in about 2 weeks (around 06/18/18).  Patrina Levering, PharmD Candidate

## 2018-06-04 NOTE — Telephone Encounter (Signed)
Thank you :)

## 2018-06-21 ENCOUNTER — Encounter: Payer: Self-pay | Admitting: Pharmacist

## 2018-06-21 ENCOUNTER — Encounter: Payer: Self-pay | Admitting: *Deleted

## 2018-06-21 DIAGNOSIS — I82629 Acute embolism and thrombosis of deep veins of unspecified upper extremity: Secondary | ICD-10-CM

## 2018-06-21 DIAGNOSIS — D6851 Activated protein C resistance: Secondary | ICD-10-CM | POA: Diagnosis not present

## 2018-06-21 DIAGNOSIS — D689 Coagulation defect, unspecified: Secondary | ICD-10-CM

## 2018-06-21 DIAGNOSIS — Z7901 Long term (current) use of anticoagulants: Secondary | ICD-10-CM | POA: Diagnosis not present

## 2018-06-21 LAB — POCT INR: INR: 2.4 (ref 2.0–3.0)

## 2018-06-26 NOTE — Progress Notes (Addendum)
Anticoagulation Management Jessica H Barkeris a 57 y.o.femalewho contacted clinical pharmacist for warfarin management. Patient has a home INR meter  Indication: DVThistory, Factor V Leiden mutation Duration: indefinite Supervising physician: Murriel Hopper  Anticoagulation Clinic Visit History: Patientdoes notreport signs/symptoms of bleeding or thromboembolism or other changes.  Anticoagulation Episode Summary    Current INR goal:   2.0-3.0  TTR:   71.6 % (6.2 y)  Next INR check:   06/26/2018  INR from last check:   2.4 (06/21/2018)  Weekly max warfarin dose:     Target end date:   04/11/2015  INR check location:     Preferred lab:     Send INR reminders to:   [facility]   Indications   DVT of upper extremity (deep vein thrombosis) (HCC) [I82.629] Factor V Leiden mutation ([place]) [D68.51] Coagulopathy ([place]) [D68.9] Chronic anticoagulation [Z79.01]       Comments:         Anticoagulation Care Providers    Provider Role Specialty Phone number   Annia Belt, MD Referring Oncology [phone]     Allergies  Allergen Reactions  . Amoxicillin-Pot Clavulanate Rash  . Ceftin Rash  . Cefuroxime Rash  . Cefuroxime Axetil Rash  . Risperidone And Related Other (See Comments)    Has medical contraindications   Medication Sig  acetaminophen (TYLENOL) 500 MG tablet Take 1,000 mg by mouth as needed.  alprazolam (XANAX) 2 MG tablet Take 2 mg by mouth 2 (two) times daily as needed (May take a 1 mg during the day but always takes 2 mg at bedtime.). For anxiety  anastrozole (ARIMIDEX) 1 MG tablet Take 1 mg by mouth daily.    cyanocobalamin (,VITAMIN B-12,) 1000 MCG/ML injection Inject 100 mcg into the muscle every 30 (thirty) days.   docusate sodium (STOOL SOFTENER) 100 MG capsule Take 2 capsules (200 mg total) by mouth at bedtime.  enoxaparin (LOVENOX) 150 MG/ML injection Inject 1 mL (150 mg total) into the skin daily.  esomeprazole (NEXIUM) 40 MG capsule  Take 40 mg by mouth 2 (two) times daily before a meal.  fluticasone (FLONASE) 50 MCG/ACT nasal spray Place 2 sprays into the nose daily.   levocetirizine (XYZAL) 5 MG tablet TAKE ONE TABLET BY MOUTH AT BEDTIME.  lidocaine (LIDODERM) 5 % Place 1 patch onto the skin as needed. Remove & Discard patch within 12 hours or as directed by MD (only uses prn)  metoprolol (TOPROL-XL) 100 MG 24 hr tablet Take 100 mg by mouth daily.    omega-3 fish oil (MAXEPA) 1000 MG CAPS capsule Take 1 capsule (1,000 mg total) by mouth 2 (two) times daily.  promethazine (PHENERGAN) 25 MG tablet Take 25 mg by mouth as needed for nausea.   simvastatin (ZOCOR) 20 MG tablet Take 20 mg by mouth daily.   warfarin (COUMADIN) 7.5 MG tablet Take 1 tablet (7.5 mg total) by mouth daily. Except 1/2 tablet on Tue/Thur   Past Medical History:  Diagnosis Date  . Abnormal Pap smear   . Acid reflux   . Bipolar disorder ([place]) 03/22/2013  . Breast cancer ([name])   . Breast disorder    cancer  . Chronic anticoagulation 03/22/2013  . Coagulopathy ([name]) 04/10/2012  . Constipation   . DVT of upper extremity (deep vein thrombosis) ([place]) 04/10/2012  . Factor V Leiden ([place])   . Factor V Leiden mutation ([place]) 04/10/2012  . Family history of ovarian cancer   . Fatty liver 06/04/2017  . Fibroid   .  Gallstones   . Heart murmur   . High cholesterol   . History of abnormal cervical Pap smear 01/06/2015  . History of breast cancer 01/06/2015  . HTN (hypertension)   . HX: breast cancer   . Hypertension 12/31/2012  . Kidney stones   . Kidney stones 06/04/2017  . Lobular carcinoma of breast, estrogen receptor positive, 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  . Mental disorder    panic attacks  . Osteoarthritis   . Pain in both knees   . Vaginal Pap smear, abnormal    Social History   Socioeconomic History  . Marital status: Divorced    Spouse name: Not on file  . Number of children: Not on file  . Years of education: Not on file  . Highest education level: Not on  file  Occupational History  . Not on file  Social Needs  . Financial resource strain: Not on file  . Food insecurity:    Worry: Not on file    Inability: Not on file  . Transportation needs:    Medical: Not on file    Non-medical: Not on file  Tobacco Use  . Smoking status: Never Smoker  . Smokeless tobacco: Never Used  Substance and Sexual Activity  . Alcohol use: Yes    Alcohol/week: 0.0 standard drinks    Comment: 07-25-2016 per pt rarely  . Drug use: No    Comment: 07-25-2016 Per pt no   . Sexual activity: Never    Birth control/protection: Surgical    Comment: partial hyst  Lifestyle  . Physical activity:    Days per week: Not on file    Minutes per session: Not on file  . Stress: Not on file  Relationships  . Social connections:    Talks on phone: Not on file    Gets together: Not on file    Attends religious service: Not on file    Active member of club or organization: Not on file    Attends meetings of clubs or organizations: Not on file    Relationship status: Not on file  Other Topics Concern  . Not on file  Social History Narrative  . Not on file   Family History  Problem Relation Age of Onset  . Alcohol abuse Mother   . COPD Mother   . Alcohol abuse Father   . Heart disease Father   . Pneumonia Father   . Diabetes Father   . Colon cancer Father        Dx 67s; deceased 58s  . OCD Daughter   . Bipolar disorder Daughter   . Hypertension Daughter   . Schizophrenia Daughter   . Schizophrenia Daughter   . Bipolar disorder Daughter   . OCD Daughter   . Hypertension Daughter   . COPD Daughter   . Other Daughter        stomach don't empty out  . ADD / ADHD Grandchild   . Anxiety disorder Grandchild   . Depression Grandchild   . Healthy Sister   . Cancer Brother        leukemia 55s; deceased 68  . Heart disease Brother   . Cancer Brother        liver; currently 44  . Cirrhosis Brother   . Other Brother        waiting for liver transplant  .  Alzheimer's disease Paternal Grandmother   . Ovarian cancer Maternal Grandmother 23       deceased 27  .  Cancer Maternal Uncle        3 of 6 with various cancers: skin, NHL, Hodkin's Dx  . Breast cancer Cousin 63       mat first cousin related through unaffected aunt   ASSESSMENT Recent Results: Lab Results  Component Value Date   INR 2.4 06/21/2018   INR 2.5 05/31/2018   INR 2.6 05/10/2018   PROTIME 27.6 (H) 07/30/2013   Anticoagulation Dosing: Description   DR [name]     INR today: Therapeutic  PLAN Weekly dose was unchanged   There are no Patient Instructions on file for this visit. Patient advised to contact clinic or seek medical attention if signs/symptoms of bleeding or thromboembolism occur.  Patient verbalized understanding by repeating back information and was advised to contact me if further medication-related questions arise. Patient was also provided an information handout.  Follow-up Return in about 10 days (around 07/01/2018).  [name]

## 2018-07-01 ENCOUNTER — Encounter (INDEPENDENT_AMBULATORY_CARE_PROVIDER_SITE_OTHER): Payer: Self-pay | Admitting: *Deleted

## 2018-07-04 NOTE — Progress Notes (Signed)
I reviewed Dr. Julianne Rice note.  Patient is on [facility] for DVT and coagulopathy history.  INR at goal.

## 2018-07-09 DIAGNOSIS — C50011 Malignant neoplasm of nipple and areola, right female breast: Secondary | ICD-10-CM | POA: Diagnosis not present

## 2018-07-09 DIAGNOSIS — C50012 Malignant neoplasm of nipple and areola, left female breast: Secondary | ICD-10-CM | POA: Diagnosis not present

## 2018-07-11 LAB — PROTIME-INR

## 2018-07-12 ENCOUNTER — Encounter: Payer: Self-pay | Admitting: Pharmacist

## 2018-07-12 ENCOUNTER — Telehealth: Payer: Self-pay | Admitting: *Deleted

## 2018-07-12 DIAGNOSIS — Z7901 Long term (current) use of anticoagulants: Secondary | ICD-10-CM

## 2018-07-12 DIAGNOSIS — I82629 Acute embolism and thrombosis of deep veins of unspecified upper extremity: Secondary | ICD-10-CM

## 2018-07-12 DIAGNOSIS — D689 Coagulation defect, unspecified: Secondary | ICD-10-CM

## 2018-07-12 DIAGNOSIS — D6851 Activated protein C resistance: Secondary | ICD-10-CM

## 2018-07-12 LAB — POCT INR: INR: 2.9 (ref 2.0–3.0)

## 2018-07-12 NOTE — Telephone Encounter (Signed)
rec'd fax from [facility] INR on 10/4 at 1130 is 2.9 Hard copy placed on dr [employer] desk Sending to dr Maudie Mercury and dr [name]

## 2018-07-12 NOTE — Telephone Encounter (Signed)
Noted Thanks DrG 

## 2018-07-17 NOTE — Progress Notes (Addendum)
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 Patient has a home INR meter  Indication: DVThistory, Factor V Leiden mutation Duration: indefinite Supervising physician: Murriel Hopper  Anticoagulation Clinic Visit History: Patientdoes notreport signs/symptoms of bleeding or thromboembolism or other changes.  Anticoagulation Episode Summary    Current INR goal:   2.0-3.0  TTR:   71.8 % (6.2 y)  Next INR check:   07/26/2018  INR from last check:   2.9 (07/12/2018)     Weekly max warfarin dose:     Target end date:   04/11/2015  INR check location:     Preferred lab:     Send INR reminders to:   [place]   Indications   DVT of upper extremity (deep vein thrombosis) (HCC) [I82.629] Factor V Leiden mutation ([place]) [D68.51] Coagulopathy ([place]) [D68.9] Chronic anticoagulation [Z79.01]       Comments:         Anticoagulation Care Providers    Provider Role Specialty Phone number   Annia Belt, MD Referring Oncology [phone]     Allergies  Allergen Reactions  . Amoxicillin-Pot Clavulanate Rash  . Ceftin Rash  . Cefuroxime Rash  . Cefuroxime Axetil Rash  . Risperidone And Related Other (See Comments)    Has medical contraindications   Medication Sig  acetaminophen (TYLENOL) 500 MG tablet Take 1,000 mg by mouth as needed.  alprazolam (XANAX) 2 MG tablet Take 2 mg by mouth 2 (two) times daily as needed (May take a 1 mg during the day but always takes 2 mg at bedtime.). For anxiety  anastrozole (ARIMIDEX) 1 MG tablet Take 1 mg by mouth daily.    cyanocobalamin (,VITAMIN B-12,) 1000 MCG/ML injection Inject 100 mcg into the muscle every 30 (thirty) days.   docusate sodium (STOOL SOFTENER) 100 MG capsule Take 2 capsules (200 mg total) by mouth at bedtime.  enoxaparin (LOVENOX) 150 MG/ML injection Inject 1 mL (150 mg total) into the skin daily.  esomeprazole (NEXIUM) 40 MG  capsule Take 40 mg by mouth 2 (two) times daily before a meal.  fluticasone (FLONASE) 50 MCG/ACT nasal spray Place 2 sprays into the nose daily.   levocetirizine (XYZAL) 5 MG tablet TAKE ONE TABLET BY MOUTH AT BEDTIME.  lidocaine (LIDODERM) 5 % Place 1 patch onto the skin as needed. Remove & Discard patch within 12 hours or as directed by MD (only uses prn)  metoprolol (TOPROL-XL) 100 MG 24 hr tablet Take 100 mg by mouth daily.    omega-3 fish oil (MAXEPA) 1000 MG CAPS capsule Take 1 capsule (1,000 mg total) by mouth 2 (two) times daily.  promethazine (PHENERGAN) 25 MG tablet Take 25 mg by mouth as needed for nausea.   simvastatin (ZOCOR) 20 MG tablet Take 20 mg by mouth daily.   warfarin (COUMADIN) 7.5 MG tablet Take 1 tablet (7.5 mg total) by mouth daily. Except 1/2 tablet on Tue/Thur   Past Medical History:  Diagnosis Date  . Abnormal Pap smear   . Acid reflux   . Bipolar disorder ([place]) 03/22/2013  . Breast cancer ([place])   . Breast disorder    cancer  . Chronic anticoagulation 03/22/2013  . Coagulopathy ([place]) 04/10/2012  . Constipation   . DVT of upper extremity (deep vein thrombosis) ([place]) 04/10/2012  . Factor V Leiden ([place])   . Factor V Leiden mutation ([place]) 04/10/2012  . Family history of ovarian cancer   . Fatty liver 06/04/2017  . Fibroid   .  Gallstones   . Heart murmur   . High cholesterol   . History of abnormal cervical Pap smear 01/06/2015  . History of breast cancer 01/06/2015  . HTN (hypertension)   . HX: breast cancer   . Hypertension 12/31/2012  . Kidney stones   . Kidney stones 06/04/2017  . Lobular carcinoma of breast, estrogen receptor positive, 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  . Mental disorder    panic attacks  . Osteoarthritis   . Pain in both knees   . Vaginal Pap smear, abnormal    Social History   Socioeconomic History  . Marital status: Divorced    Spouse name: Not on file  . Number of children: Not on file  . Years of education: Not on file  . Highest education level:  Not on file  Occupational History  . Not on file  Social Needs  . Financial resource strain: Not on file  . Food insecurity:    Worry: Not on file    Inability: Not on file  . Transportation needs:    Medical: Not on file    Non-medical: Not on file  Tobacco Use  . Smoking status: Never Smoker  . Smokeless tobacco: Never Used  Substance and Sexual Activity  . Alcohol use: Yes    Alcohol/week: 0.0 standard drinks    Comment: 07-25-2016 per pt rarely  . Drug use: No    Comment: 07-25-2016 Per pt no   . Sexual activity: Never    Birth control/protection: Surgical    Comment: partial hyst  Lifestyle  . Physical activity:    Days per week: Not on file    Minutes per session: Not on file  . Stress: Not on file  Relationships  . Social connections:    Talks on phone: Not on file    Gets together: Not on file    Attends religious service: Not on file    Active member of club or organization: Not on file    Attends meetings of clubs or organizations: Not on file    Relationship status: Not on file  Other Topics Concern  . Not on file  Social History Narrative  . Not on file   Family History  Problem Relation Age of Onset  . Alcohol abuse Mother   . COPD Mother   . Alcohol abuse Father   . Heart disease Father   . Pneumonia Father   . Diabetes Father   . Colon cancer Father        Dx 25s; deceased 54s  . OCD Daughter   . Bipolar disorder Daughter   . Hypertension Daughter   . Schizophrenia Daughter   . Schizophrenia Daughter   . Bipolar disorder Daughter   . OCD Daughter   . Hypertension Daughter   . COPD Daughter   . Other Daughter        stomach don't empty out  . ADD / ADHD Grandchild   . Anxiety disorder Grandchild   . Depression Grandchild   . Healthy Sister   . Cancer Brother        leukemia 61s; deceased 69  . Heart disease Brother   . Cancer Brother        liver; currently 35  . Cirrhosis Brother   . Other Brother        waiting for liver  transplant  . Alzheimer's disease Paternal Grandmother   . Ovarian cancer Maternal Grandmother 28       deceased 75  .  Cancer Maternal Uncle        3 of 6 with various cancers: skin, NHL, Hodkin's Dx  . Breast cancer Cousin 4       mat first cousin related through unaffected aunt   ASSESSMENT Recent Results: Lab Results  Component Value Date   INR 2.9 07/12/2018   INR 2.4 06/21/2018   INR 2.5 05/31/2018   PROTIME 27.6 (H) 07/30/2013   Anticoagulation Dosing: Description   DR [name]     INR today: Therapeutic  PLAN Weekly dose was unchanged  There are no Patient Instructions on file for this visit. Patient advised to contact clinic or seek medical attention if signs/symptoms of bleeding or thromboembolism occur.  Patient verbalized understanding by repeating back information and was advised to contact me if further medication-related questions arise. Patient was also provided an information handout.  Follow-up 2 weeks  [name]

## 2018-07-18 LAB — PROTIME-INR

## 2018-08-12 ENCOUNTER — Encounter: Payer: Self-pay | Admitting: Pharmacist

## 2018-08-12 DIAGNOSIS — Z7901 Long term (current) use of anticoagulants: Secondary | ICD-10-CM

## 2018-08-12 DIAGNOSIS — D689 Coagulation defect, unspecified: Secondary | ICD-10-CM

## 2018-08-12 DIAGNOSIS — I82629 Acute embolism and thrombosis of deep veins of unspecified upper extremity: Secondary | ICD-10-CM

## 2018-08-12 DIAGNOSIS — D6851 Activated protein C resistance: Secondary | ICD-10-CM

## 2018-08-12 LAB — POCT INR: INR: 2.4 (ref 2.0–3.0)

## 2018-08-12 NOTE — Progress Notes (Signed)
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 Patient has a home INR meter  Indication: DVThistory, Factor V Leiden mutation Duration: indefinite Supervising physician: Murriel Hopper  Anticoagulation Clinic Visit History: Patientdoes notreport signs/symptoms of bleeding or thromboembolism or other changes.  Anticoagulation Episode Summary    Current INR goal:   2.0-3.0  TTR:   72.2 % (6.3 y)  Next INR check:   09/02/2018  INR from last check:   2.4 (08/12/2018)  Weekly max warfarin dose:     Target end date:   04/11/2015  INR check location:     Preferred lab:     Send INR reminders to:   [place]   Indications   DVT of upper extremity (deep vein thrombosis) (HCC) [I82.629] Factor V Leiden mutation ([place]) [D68.51] Coagulopathy ([place]) [D68.9] Chronic anticoagulation [Z79.01]       Comments:         Anticoagulation Care Providers    Provider Role Specialty Phone number   Annia Belt, MD Referring Oncology [phone]     Allergies  Allergen Reactions  . Amoxicillin-Pot Clavulanate Rash  . Ceftin Rash  . Cefuroxime Rash  . Cefuroxime Axetil Rash  . Risperidone And Related Other (See Comments)    Has medical contraindications   Medication Sig  acetaminophen (TYLENOL) 500 MG tablet Take 1,000 mg by mouth as needed.  alprazolam (XANAX) 2 MG tablet Take 2 mg by mouth 2 (two) times daily as needed (May take a 1 mg during the day but always takes 2 mg at bedtime.). For anxiety  anastrozole (ARIMIDEX) 1 MG tablet Take 1 mg by mouth daily.    cyanocobalamin (,VITAMIN B-12,) 1000 MCG/ML injection Inject 100 mcg into the muscle every 30 (thirty) days.   docusate sodium (STOOL SOFTENER) 100 MG capsule Take 2 capsules (200 mg total) by mouth at bedtime.  enoxaparin (LOVENOX) 150 MG/ML injection Inject 1 mL (150 mg total) into the skin daily.  esomeprazole (NEXIUM) 40 MG capsule  Take 40 mg by mouth 2 (two) times daily before a meal.  fluticasone (FLONASE) 50 MCG/ACT nasal spray Place 2 sprays into the nose daily.   levocetirizine (XYZAL) 5 MG tablet TAKE ONE TABLET BY MOUTH AT BEDTIME.  lidocaine (LIDODERM) 5 % Place 1 patch onto the skin as needed. Remove & Discard patch within 12 hours or as directed by MD (only uses prn)  metoprolol (TOPROL-XL) 100 MG 24 hr tablet Take 100 mg by mouth daily.    omega-3 fish oil (MAXEPA) 1000 MG CAPS capsule Take 1 capsule (1,000 mg total) by mouth 2 (two) times daily.  promethazine (PHENERGAN) 25 MG tablet Take 25 mg by mouth as needed for nausea.   simvastatin (ZOCOR) 20 MG tablet Take 20 mg by mouth daily.   warfarin (COUMADIN) 7.5 MG tablet Take 1 tablet (7.5 mg total) by mouth daily. Except 1/2 tablet on Tue/Thur   Past Medical History:  Diagnosis Date  . Abnormal Pap smear   . Acid reflux   . Bipolar disorder ([place]) 03/22/2013  . Breast cancer ([place])   . Breast disorder    cancer  . Chronic anticoagulation 03/22/2013  . Coagulopathy ([place]) 04/10/2012  . Constipation   . DVT of upper extremity (deep vein thrombosis) ([place]) 04/10/2012  . Factor V Leiden ([place])   . Factor V Leiden mutation ([place]) 04/10/2012  . Family history of ovarian cancer   . Fatty liver 06/04/2017  . Fibroid   . Gallstones   .  Heart murmur   . High cholesterol   . History of abnormal cervical Pap smear 01/06/2015  . History of breast cancer 01/06/2015  . HTN (hypertension)   . HX: breast cancer   . Hypertension 12/31/2012  . Kidney stones   . Kidney stones 06/04/2017  . Lobular carcinoma of breast, estrogen receptor positive, 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  . Mental disorder    panic attacks  . Osteoarthritis   . Pain in both knees   . Vaginal Pap smear, abnormal    Social History   Socioeconomic History  . Marital status: Divorced    Spouse name: Not on file  . Number of children: Not on file  . Years of education: Not on file  . Highest education level: Not on  file  Occupational History  . Not on file  Social Needs  . Financial resource strain: Not on file  . Food insecurity:    Worry: Not on file    Inability: Not on file  . Transportation needs:    Medical: Not on file    Non-medical: Not on file  Tobacco Use  . Smoking status: Never Smoker  . Smokeless tobacco: Never Used  Substance and Sexual Activity  . Alcohol use: Yes    Alcohol/week: 0.0 standard drinks    Comment: 07-25-2016 per pt rarely  . Drug use: No    Comment: 07-25-2016 Per pt no   . Sexual activity: Never    Birth control/protection: Surgical    Comment: partial hyst  Lifestyle  . Physical activity:    Days per week: Not on file    Minutes per session: Not on file  . Stress: Not on file  Relationships  . Social connections:    Talks on phone: Not on file    Gets together: Not on file    Attends religious service: Not on file    Active member of club or organization: Not on file    Attends meetings of clubs or organizations: Not on file    Relationship status: Not on file  Other Topics Concern  . Not on file  Social History Narrative  . Not on file   Family History  Problem Relation Age of Onset  . Alcohol abuse Mother   . COPD Mother   . Alcohol abuse Father   . Heart disease Father   . Pneumonia Father   . Diabetes Father   . Colon cancer Father        Dx 83s; deceased 66s  . OCD Daughter   . Bipolar disorder Daughter   . Hypertension Daughter   . Schizophrenia Daughter   . Schizophrenia Daughter   . Bipolar disorder Daughter   . OCD Daughter   . Hypertension Daughter   . COPD Daughter   . Other Daughter        stomach don't empty out  . ADD / ADHD Grandchild   . Anxiety disorder Grandchild   . Depression Grandchild   . Healthy Sister   . Cancer Brother        leukemia 77s; deceased 58  . Heart disease Brother   . Cancer Brother        liver; currently 26  . Cirrhosis Brother   . Other Brother        waiting for liver transplant  .  Alzheimer's disease Paternal Grandmother   . Ovarian cancer Maternal Grandmother 4       deceased 30  . Cancer Maternal Uncle  3 of 6 with various cancers: skin, NHL, Hodkin's Dx  . Breast cancer Cousin 36       mat first cousin related through unaffected aunt   ASSESSMENT Recent Results: Lab Results  Component Value Date   INR 2.4 08/12/2018   INR 2.9 07/12/2018   INR 2.4 06/21/2018   PROTIME 27.6 (H) 07/30/2013   Anticoagulation Dosing: Description   DR [name]     INR today: Therapeutic  PLAN Weekly dose was unchanged   There are no Patient Instructions on file for this visit. Patient advised to contact clinic or seek medical attention if signs/symptoms of bleeding or thromboembolism occur.  Patient verbalized understanding by repeating back information and was advised to contact me if further medication-related questions arise. Patient was also provided an information handout.  Follow-up 4 weeks  [name]

## 2018-08-12 NOTE — Progress Notes (Signed)
Reviewed thx drG 

## 2018-08-14 LAB — PROTIME-INR

## 2018-08-28 DIAGNOSIS — F329 Major depressive disorder, single episode, unspecified: Secondary | ICD-10-CM | POA: Diagnosis not present

## 2018-08-28 DIAGNOSIS — R079 Chest pain, unspecified: Secondary | ICD-10-CM | POA: Diagnosis not present

## 2018-08-28 DIAGNOSIS — I350 Nonrheumatic aortic (valve) stenosis: Secondary | ICD-10-CM | POA: Diagnosis not present

## 2018-08-28 DIAGNOSIS — Z79899 Other long term (current) drug therapy: Secondary | ICD-10-CM | POA: Diagnosis not present

## 2018-08-28 DIAGNOSIS — Z7901 Long term (current) use of anticoagulants: Secondary | ICD-10-CM | POA: Diagnosis not present

## 2018-08-28 DIAGNOSIS — R002 Palpitations: Secondary | ICD-10-CM | POA: Diagnosis not present

## 2018-08-28 DIAGNOSIS — D682 Hereditary deficiency of other clotting factors: Secondary | ICD-10-CM | POA: Diagnosis not present

## 2018-08-28 DIAGNOSIS — E669 Obesity, unspecified: Secondary | ICD-10-CM | POA: Diagnosis not present

## 2018-08-28 DIAGNOSIS — I517 Cardiomegaly: Secondary | ICD-10-CM | POA: Diagnosis not present

## 2018-08-29 ENCOUNTER — Encounter (INDEPENDENT_AMBULATORY_CARE_PROVIDER_SITE_OTHER): Payer: Self-pay | Admitting: Physical Medicine and Rehabilitation

## 2018-08-29 ENCOUNTER — Ambulatory Visit (INDEPENDENT_AMBULATORY_CARE_PROVIDER_SITE_OTHER): Payer: PPO | Admitting: Physical Medicine and Rehabilitation

## 2018-08-29 VITALS — BP 117/81 | HR 87

## 2018-08-29 DIAGNOSIS — M4125 Other idiopathic scoliosis, thoracolumbar region: Secondary | ICD-10-CM | POA: Diagnosis not present

## 2018-08-29 DIAGNOSIS — M545 Low back pain: Secondary | ICD-10-CM

## 2018-08-29 DIAGNOSIS — G8929 Other chronic pain: Secondary | ICD-10-CM

## 2018-08-29 DIAGNOSIS — F411 Generalized anxiety disorder: Secondary | ICD-10-CM | POA: Diagnosis not present

## 2018-08-29 DIAGNOSIS — M47816 Spondylosis without myelopathy or radiculopathy, lumbar region: Secondary | ICD-10-CM | POA: Diagnosis not present

## 2018-08-29 MED ORDER — TRIAZOLAM 0.25 MG PO TABS: ORAL_TABLET | ORAL | 0 refills | Status: DC

## 2018-08-29 NOTE — Progress Notes (Signed)
  Numeric Pain Rating Scale and Functional Assessment Average Pain 10 Pain Right Now 9 My pain is constant, sharp, dull and aching Pain is worse with: walking, sitting and standing Pain improves with: rest   In the last MONTH (on 0-10 scale) has pain interfered with the following?  1. General activity like being  able to carry out your everyday physical activities such as walking, climbing stairs, carrying groceries, or moving a chair?  Rating(10)  2. Relation with others like being able to carry out your usual social activities and roles such as  activities at home, at work and in your community. Rating(10)  3. Enjoyment of life such that you have  been bothered by emotional problems such as feeling anxious, depressed or irritable?  Rating(10)

## 2018-09-02 ENCOUNTER — Encounter: Payer: Self-pay | Admitting: Pharmacist

## 2018-09-02 DIAGNOSIS — Z7901 Long term (current) use of anticoagulants: Secondary | ICD-10-CM

## 2018-09-02 DIAGNOSIS — D689 Coagulation defect, unspecified: Secondary | ICD-10-CM

## 2018-09-02 DIAGNOSIS — I82629 Acute embolism and thrombosis of deep veins of unspecified upper extremity: Secondary | ICD-10-CM

## 2018-09-02 DIAGNOSIS — D6851 Activated protein C resistance: Secondary | ICD-10-CM

## 2018-09-02 LAB — POCT INR: INR: 2.7 (ref 2.0–3.0)

## 2018-09-02 NOTE — Progress Notes (Signed)
Reviewed Thanks drG 

## 2018-09-02 NOTE — Progress Notes (Signed)
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 Patient has a home INR meter  Indication: DVThistory, Factor V Leiden mutation Duration: indefinite Supervising physician: Murriel Hopper  Anticoagulation Clinic Visit History: Patientdoes notreport signs/symptoms of bleeding or thromboembolism or other changes.  Anticoagulation Episode Summary    Current INR goal:   2.0-3.0  TTR:   72.5 % (6.4 y)  Next INR check:   09/30/2018  INR from last check:   2.7 (09/02/2018)  Weekly max warfarin dose:     Target end date:   04/11/2015  INR check location:     Preferred lab:     Send INR reminders to:   [place]   Indications   DVT of upper extremity (deep vein thrombosis) (HCC) [I82.629] Factor V Leiden mutation ([place]) [D68.51] Coagulopathy ([place]) [D68.9] Chronic anticoagulation [Z79.01]       Comments:         Anticoagulation Care Providers    Provider Role Specialty Phone number   Annia Belt, MD Referring Oncology [phone]     Allergies  Allergen Reactions  . Amoxicillin-Pot Clavulanate Rash  . Ceftin Rash  . Cefuroxime Rash  . Cefuroxime Axetil Rash  . Risperidone And Related Other (See Comments)    Has medical contraindications   Medication Sig  acetaminophen (TYLENOL) 500 MG tablet Take 1,000 mg by mouth as needed.  alprazolam (XANAX) 2 MG tablet Take 2 mg by mouth 2 (two) times daily as needed (May take a 1 mg during the day but always takes 2 mg at bedtime.). For anxiety  anastrozole (ARIMIDEX) 1 MG tablet Take 1 mg by mouth daily.    Calcium Carbonate-Vit D-Min (CALCIUM 600+D3 PLUS MINERALS) 600-800 MG-UNIT TABS Take by mouth.  cyanocobalamin (,VITAMIN B-12,) 1000 MCG/ML injection Inject 100 mcg into the muscle every 30 (thirty) days.   docusate sodium (STOOL SOFTENER) 100 MG capsule Take 2 capsules (200 mg total) by mouth at bedtime.  enoxaparin (LOVENOX) 150  MG/ML injection Inject 1 mL (150 mg total) into the skin daily. Patient not taking: Reported on 08/29/2018  esomeprazole (NEXIUM) 40 MG capsule Take 40 mg by mouth 2 (two) times daily before a meal.  fluticasone (FLONASE) 50 MCG/ACT nasal spray Place 2 sprays into the nose daily.   ibuprofen (ADVIL,MOTRIN) 800 MG tablet Take by mouth.  levocetirizine (XYZAL) 5 MG tablet TAKE ONE TABLET BY MOUTH AT BEDTIME. Patient not taking: Reported on 08/29/2018  lidocaine (LIDODERM) 5 % Place 1 patch onto the skin as needed. Remove & Discard patch within 12 hours or as directed by MD (only uses prn)  metoprolol (TOPROL-XL) 100 MG 24 hr tablet Take 100 mg by mouth daily.    omega-3 fish oil (MAXEPA) 1000 MG CAPS capsule Take 1 capsule (1,000 mg total) by mouth 2 (two) times daily.  promethazine (PHENERGAN) 25 MG tablet Take 25 mg by mouth as needed for nausea.   simvastatin (ZOCOR) 20 MG tablet Take 20 mg by mouth daily.   triazolam (HALCION) 0.25 MG tablet Take 1 tab by mouth 1 hour prior to procedure with very light food, do not drive motor vehicle.  warfarin (COUMADIN) 7.5 MG tablet Take 1 tablet (7.5 mg total) by mouth daily. Except 1/2 tablet on Tue/Thur   Past Medical History:  Diagnosis Date  . Abnormal Pap smear   . Acid reflux   . Bipolar disorder ([place]) 03/22/2013  . Breast cancer ([place])   . Breast disorder    cancer  .  Chronic anticoagulation 03/22/2013  . Coagulopathy ([place]) 04/10/2012  . Constipation   . DVT of upper extremity (deep vein thrombosis) ([place]) 04/10/2012  . Factor V Leiden ([place])   . Factor V Leiden mutation ([place]) 04/10/2012  . Family history of ovarian cancer   . Fatty liver 06/04/2017  . Fibroid   . Gallstones   . Heart murmur   . High cholesterol   . History of abnormal cervical Pap smear 01/06/2015  . History of breast cancer 01/06/2015  . HTN (hypertension)   . HX: breast cancer   . Hypertension 12/31/2012  . Kidney stones   . Kidney stones 06/04/2017  . Lobular carcinoma of  breast, estrogen receptor positive, 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  . Mental disorder    panic attacks  . Osteoarthritis   . Pain in both knees   . Vaginal Pap smear, abnormal    Social History   Socioeconomic History  . Marital status: Divorced    Spouse name: Not on file  . Number of children: Not on file  . Years of education: Not on file  . Highest education level: Not on file  Occupational History  . Not on file  Social Needs  . Financial resource strain: Not on file  . Food insecurity:    Worry: Not on file    Inability: Not on file  . Transportation needs:    Medical: Not on file    Non-medical: Not on file  Tobacco Use  . Smoking status: Never Smoker  . Smokeless tobacco: Never Used  Substance and Sexual Activity  . Alcohol use: Yes    Alcohol/week: 0.0 standard drinks    Comment: 07-25-2016 per pt rarely  . Drug use: No    Comment: 07-25-2016 Per pt no   . Sexual activity: Never    Birth control/protection: Surgical    Comment: partial hyst  Lifestyle  . Physical activity:    Days per week: Not on file    Minutes per session: Not on file  . Stress: Not on file  Relationships  . Social connections:    Talks on phone: Not on file    Gets together: Not on file    Attends religious service: Not on file    Active member of club or organization: Not on file    Attends meetings of clubs or organizations: Not on file    Relationship status: Not on file  Other Topics Concern  . Not on file  Social History Narrative  . Not on file   Family History  Problem Relation Age of Onset  . Alcohol abuse Mother   . COPD Mother   . Alcohol abuse Father   . Heart disease Father   . Pneumonia Father   . Diabetes Father   . Colon cancer Father        Dx 73s; deceased 42s  . OCD Daughter   . Bipolar disorder Daughter   . Hypertension Daughter   . Schizophrenia Daughter   . Schizophrenia Daughter   . Bipolar disorder Daughter   . OCD Daughter   . Hypertension Daughter   .  COPD Daughter   . Other Daughter        stomach don't empty out  . ADD / ADHD Grandchild   . Anxiety disorder Grandchild   . Depression Grandchild   . Healthy Sister   . Cancer Brother        leukemia 77s; deceased 60  . Heart disease Brother   .  Cancer Brother        liver; currently 12  . Cirrhosis Brother   . Other Brother        waiting for liver transplant  . Alzheimer's disease Paternal Grandmother   . Ovarian cancer Maternal Grandmother 35       deceased 44  . Cancer Maternal Uncle        3 of 6 with various cancers: skin, NHL, Hodkin's Dx  . Breast cancer Cousin 4       mat first cousin related through unaffected aunt   ASSESSMENT Lab Results  Component Value Date   INR 2.7 09/02/2018   INR 2.4 08/12/2018   INR 2.9 07/12/2018   PROTIME 27.6 (H) 07/30/2013   Anticoagulation Dosing: Description   DR [name]     INR today: Therapeutic  PLAN Weekly dose was unchanged  There are no Patient Instructions on file for this visit. Patient advised to contact clinic or seek medical attention if signs/symptoms of bleeding or thromboembolism occur.  Patient verbalized understanding by repeating back information and was advised to contact me if further medication-related questions arise. Patient was also provided an information handout.  Follow-up Return in about 4 weeks (around 09/30/2018).  [name]

## 2018-09-17 DIAGNOSIS — Z6838 Body mass index (BMI) 38.0-38.9, adult: Secondary | ICD-10-CM | POA: Diagnosis not present

## 2018-09-17 DIAGNOSIS — F419 Anxiety disorder, unspecified: Secondary | ICD-10-CM | POA: Diagnosis not present

## 2018-09-17 DIAGNOSIS — J302 Other seasonal allergic rhinitis: Secondary | ICD-10-CM | POA: Diagnosis not present

## 2018-09-17 DIAGNOSIS — J310 Chronic rhinitis: Secondary | ICD-10-CM | POA: Diagnosis not present

## 2018-09-19 ENCOUNTER — Other Ambulatory Visit: Payer: Self-pay | Admitting: Oncology

## 2018-09-19 DIAGNOSIS — Z170 Estrogen receptor positive status [ER+]: Principal | ICD-10-CM

## 2018-09-19 DIAGNOSIS — I82629 Acute embolism and thrombosis of deep veins of unspecified upper extremity: Secondary | ICD-10-CM

## 2018-09-19 DIAGNOSIS — Z7901 Long term (current) use of anticoagulants: Secondary | ICD-10-CM

## 2018-09-19 DIAGNOSIS — C50912 Malignant neoplasm of unspecified site of left female breast: Secondary | ICD-10-CM

## 2018-09-26 ENCOUNTER — Encounter: Payer: Self-pay | Admitting: Pharmacist

## 2018-09-26 DIAGNOSIS — D6851 Activated protein C resistance: Secondary | ICD-10-CM | POA: Diagnosis not present

## 2018-09-26 DIAGNOSIS — Z7901 Long term (current) use of anticoagulants: Secondary | ICD-10-CM | POA: Diagnosis not present

## 2018-09-26 DIAGNOSIS — D689 Coagulation defect, unspecified: Secondary | ICD-10-CM

## 2018-09-26 DIAGNOSIS — I82629 Acute embolism and thrombosis of deep veins of unspecified upper extremity: Secondary | ICD-10-CM

## 2018-09-26 LAB — POCT INR: INR: 2.3 (ref 2.0–3.0)

## 2018-09-26 NOTE — Progress Notes (Signed)
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 Patient has a home INR meter  Indication: DVThistory, Factor V Leiden mutation Duration: indefinite Supervising physician: Murriel Hopper  Anticoagulation Clinic Visit History: Patientdoes notreport signs/symptoms of bleeding or thromboembolism or other changes.  Anticoagulation Episode Summary    Current INR goal:   2.0-3.0  TTR:   72.7 % (6.4 y)  Next INR check:   10/17/2018  INR from last check:   2.3 (09/26/2018)  Weekly max warfarin dose:     Target end date:   04/11/2015  INR check location:     Preferred lab:     Send INR reminders to:   [place]   Indications   DVT of upper extremity (deep vein thrombosis) ([place]) [I82.629] Factor V Leiden mutation ([place]) [D68.51] Coagulopathy ([place]) [D68.9] Chronic anticoagulation [Z79.01]       Comments:         Anticoagulation Care Providers    Provider Role Specialty Phone number   Annia Belt, MD Referring Oncology [phone]     Allergies  Allergen Reactions  . Amoxicillin-Pot Clavulanate Rash  . Ceftin Rash  . Cefuroxime Rash  . Cefuroxime Axetil Rash  . Risperidone And Related Other (See Comments)    Has medical contraindications   Medication Sig  acetaminophen (TYLENOL) 500 MG tablet Take 1,000 mg by mouth as needed.  alprazolam (XANAX) 2 MG tablet Take 2 mg by mouth 2 (two) times daily as needed (May take a 1 mg during the day but always takes 2 mg at bedtime.). For anxiety  anastrozole (ARIMIDEX) 1 MG tablet Take 1 mg by mouth daily.    Calcium Carbonate-Vit D-Min (CALCIUM 600+D3 PLUS MINERALS) 600-800 MG-UNIT TABS Take by mouth.  cyanocobalamin (,VITAMIN B-12,) 1000 MCG/ML injection Inject 100 mcg into the muscle every 30 (thirty) days.   docusate sodium (STOOL SOFTENER) 100 MG capsule Take 2 capsules (200 mg total) by mouth at bedtime.  enoxaparin (LOVENOX) 150 MG/ML  injection Inject 1 mL (150 mg total) into the skin daily. Patient not taking: Reported on 08/29/2018  esomeprazole (NEXIUM) 40 MG capsule Take 40 mg by mouth 2 (two) times daily before a meal.  fluticasone (FLONASE) 50 MCG/ACT nasal spray Place 2 sprays into the nose daily.   ibuprofen (ADVIL,MOTRIN) 800 MG tablet Take by mouth.  levocetirizine (XYZAL) 5 MG tablet TAKE ONE TABLET BY MOUTH AT BEDTIME. Patient not taking: Reported on 08/29/2018  lidocaine (LIDODERM) 5 % Place 1 patch onto the skin as needed. Remove & Discard patch within 12 hours or as directed by MD (only uses prn)  metoprolol (TOPROL-XL) 100 MG 24 hr tablet Take 100 mg by mouth daily.    omega-3 fish oil (MAXEPA) 1000 MG CAPS capsule Take 1 capsule (1,000 mg total) by mouth 2 (two) times daily.  promethazine (PHENERGAN) 25 MG tablet Take 25 mg by mouth as needed for nausea.   simvastatin (ZOCOR) 20 MG tablet Take 20 mg by mouth daily.   triazolam (HALCION) 0.25 MG tablet Take 1 tab by mouth 1 hour prior to procedure with very light food, do not drive motor vehicle.  warfarin (COUMADIN) 7.5 MG tablet Take 1 tablet (7.5 mg total) by mouth daily. Except 1/2 tablet on Tue/Thur   Past Medical History:  Diagnosis Date  . Abnormal Pap smear   . Acid reflux   . Bipolar disorder ([place]) 03/22/2013  . Breast cancer ([employer])   . Breast disorder    cancer  .  Chronic anticoagulation 03/22/2013  . Coagulopathy ([place]) 04/10/2012  . Constipation   . DVT of upper extremity (deep vein thrombosis) ([place]) 04/10/2012  . Factor V Leiden ([place])   . Factor V Leiden mutation ([place]) 04/10/2012  . Family history of ovarian cancer   . Fatty liver 06/04/2017  . Fibroid   . Gallstones   . Heart murmur   . High cholesterol   . History of abnormal cervical Pap smear 01/06/2015  . History of breast cancer 01/06/2015  . HTN (hypertension)   . HX: breast cancer   . Hypertension 12/31/2012  . Kidney stones   . Kidney stones 06/04/2017  . Lobular carcinoma of  breast, estrogen receptor positive, 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  . Mental disorder    panic attacks  . Osteoarthritis   . Pain in both knees   . Vaginal Pap smear, abnormal    Social History   Socioeconomic History  . Marital status: Divorced    Spouse name: Not on file  . Number of children: Not on file  . Years of education: Not on file  . Highest education level: Not on file  Occupational History  . Not on file  Social Needs  . Financial resource strain: Not on file  . Food insecurity:    Worry: Not on file    Inability: Not on file  . Transportation needs:    Medical: Not on file    Non-medical: Not on file  Tobacco Use  . Smoking status: Never Smoker  . Smokeless tobacco: Never Used  Substance and Sexual Activity  . Alcohol use: Yes    Alcohol/week: 0.0 standard drinks    Comment: 07-25-2016 per pt rarely  . Drug use: No    Comment: 07-25-2016 Per pt no   . Sexual activity: Never    Birth control/protection: Surgical    Comment: partial hyst  Lifestyle  . Physical activity:    Days per week: Not on file    Minutes per session: Not on file  . Stress: Not on file  Relationships  . Social connections:    Talks on phone: Not on file    Gets together: Not on file    Attends religious service: Not on file    Active member of club or organization: Not on file    Attends meetings of clubs or organizations: Not on file    Relationship status: Not on file  Other Topics Concern  . Not on file  Social History Narrative  . Not on file   Family History  Problem Relation Age of Onset  . Alcohol abuse Mother   . COPD Mother   . Alcohol abuse Father   . Heart disease Father   . Pneumonia Father   . Diabetes Father   . Colon cancer Father        Dx 17s; deceased 95s  . OCD Daughter   . Bipolar disorder Daughter   . Hypertension Daughter   . Schizophrenia Daughter   . Schizophrenia Daughter   . Bipolar disorder Daughter   . OCD Daughter   . Hypertension Daughter   .  COPD Daughter   . Other Daughter        stomach don't empty out  . ADD / ADHD Grandchild   . Anxiety disorder Grandchild   . Depression Grandchild   . Healthy Sister   . Cancer Brother        leukemia 44s; deceased 68  . Heart disease Brother   .  Cancer Brother        liver; currently 40  . Cirrhosis Brother   . Other Brother        waiting for liver transplant  . Alzheimer's disease Paternal Grandmother   . Ovarian cancer Maternal Grandmother 97       deceased 25  . Cancer Maternal Uncle        3 of 6 with various cancers: skin, NHL, Hodkin's Dx  . Breast cancer Cousin 61       mat first cousin related through unaffected aunt   ASSESSMENT Recent Results: Lab Results  Component Value Date   INR 2.3 09/26/2018   INR 2.7 09/02/2018   INR 2.4 08/12/2018   PROTIME 27.6 (H) 07/30/2013   Anticoagulation Dosing: Description   DR [name]     INR today: Therapeutic  PLAN Weekly dose was unchanged   There are no Patient Instructions on file for this visit. Patient advised to contact clinic or seek medical attention if signs/symptoms of bleeding or thromboembolism occur.  Patient verbalized understanding by repeating back information and was advised to contact me if further medication-related questions arise. Patient was also provided an information handout.  Follow-up 3 weeks  [name]

## 2018-10-16 ENCOUNTER — Ambulatory Visit
Admission: RE | Admit: 2018-10-16 | Discharge: 2018-10-16 | Disposition: A | Discharge status: Home / Self Care | Payer: PPO | Source: Ambulatory Visit | Attending: Physical Medicine and Rehabilitation | Admitting: Physical Medicine and Rehabilitation

## 2018-10-16 DIAGNOSIS — M4125 Other idiopathic scoliosis, thoracolumbar region: Secondary | ICD-10-CM

## 2018-10-16 DIAGNOSIS — M545 Low back pain, unspecified: Secondary | ICD-10-CM

## 2018-10-16 DIAGNOSIS — M47816 Spondylosis without myelopathy or radiculopathy, lumbar region: Secondary | ICD-10-CM

## 2018-10-16 DIAGNOSIS — G8929 Other chronic pain: Secondary | ICD-10-CM

## 2018-10-17 ENCOUNTER — Telehealth (INDEPENDENT_AMBULATORY_CARE_PROVIDER_SITE_OTHER): Payer: Self-pay | Admitting: Physical Medicine and Rehabilitation

## 2018-10-17 LAB — PROTIME-INR

## 2018-10-21 NOTE — Telephone Encounter (Signed)
Pt is scheduled for 11/06/2018 with driver and understands she needs to hold her BT 5 days prior to inj.

## 2018-10-21 NOTE — Telephone Encounter (Signed)
Called pt and lvm #1 

## 2018-10-21 NOTE — Telephone Encounter (Signed)
OK to hold coumadin for 5 days. Resume night of procedure. No bridge needed.  Dr [name] [phone]

## 2018-10-21 NOTE — Telephone Encounter (Signed)
Can you call patient to schedule an MRI review with L3-4 IL holding coumadin for 5 days?

## 2018-10-28 LAB — POCT INR: INR: 2.2 (ref 2.0–3.0)

## 2018-10-29 ENCOUNTER — Other Ambulatory Visit: Payer: Self-pay | Admitting: *Deleted

## 2018-10-29 ENCOUNTER — Ambulatory Visit: Payer: PPO | Admitting: Oncology

## 2018-10-29 ENCOUNTER — Encounter: Payer: Self-pay | Admitting: Oncology

## 2018-10-29 ENCOUNTER — Other Ambulatory Visit: Payer: Self-pay

## 2018-10-29 ENCOUNTER — Encounter: Payer: Self-pay | Admitting: Pharmacist

## 2018-10-29 VITALS — BP 110/59 | HR 72 | Temp 97.8°F | Ht 61.5 in | Wt 214.8 lb

## 2018-10-29 DIAGNOSIS — I82629 Acute embolism and thrombosis of deep veins of unspecified upper extremity: Secondary | ICD-10-CM

## 2018-10-29 DIAGNOSIS — G8929 Other chronic pain: Secondary | ICD-10-CM

## 2018-10-29 DIAGNOSIS — C50912 Malignant neoplasm of unspecified site of left female breast: Secondary | ICD-10-CM

## 2018-10-29 DIAGNOSIS — Z853 Personal history of malignant neoplasm of breast: Secondary | ICD-10-CM | POA: Diagnosis not present

## 2018-10-29 DIAGNOSIS — Z923 Personal history of irradiation: Secondary | ICD-10-CM | POA: Diagnosis not present

## 2018-10-29 DIAGNOSIS — Z9013 Acquired absence of bilateral breasts and nipples: Secondary | ICD-10-CM

## 2018-10-29 DIAGNOSIS — Z86718 Personal history of other venous thrombosis and embolism: Secondary | ICD-10-CM | POA: Diagnosis not present

## 2018-10-29 DIAGNOSIS — Z90722 Acquired absence of ovaries, bilateral: Secondary | ICD-10-CM

## 2018-10-29 DIAGNOSIS — Z7901 Long term (current) use of anticoagulants: Secondary | ICD-10-CM

## 2018-10-29 DIAGNOSIS — Z881 Allergy status to other antibiotic agents status: Secondary | ICD-10-CM | POA: Diagnosis not present

## 2018-10-29 DIAGNOSIS — D689 Coagulation defect, unspecified: Secondary | ICD-10-CM

## 2018-10-29 DIAGNOSIS — D6851 Activated protein C resistance: Secondary | ICD-10-CM | POA: Diagnosis not present

## 2018-10-29 DIAGNOSIS — I82623 Acute embolism and thrombosis of deep veins of upper extremity, bilateral: Secondary | ICD-10-CM

## 2018-10-29 DIAGNOSIS — Z9221 Personal history of antineoplastic chemotherapy: Secondary | ICD-10-CM

## 2018-10-29 DIAGNOSIS — I82622 Acute embolism and thrombosis of deep veins of left upper extremity: Secondary | ICD-10-CM

## 2018-10-29 DIAGNOSIS — Z79890 Hormone replacement therapy (postmenopausal): Secondary | ICD-10-CM | POA: Diagnosis not present

## 2018-10-29 DIAGNOSIS — Z888 Allergy status to other drugs, medicaments and biological substances status: Secondary | ICD-10-CM

## 2018-10-29 DIAGNOSIS — Z170 Estrogen receptor positive status [ER+]: Secondary | ICD-10-CM

## 2018-10-29 DIAGNOSIS — M549 Dorsalgia, unspecified: Secondary | ICD-10-CM | POA: Diagnosis not present

## 2018-10-29 LAB — PROTIME-INR

## 2018-10-29 MED ORDER — WARFARIN SODIUM 7.5 MG PO TABS: 7.5000 mg | ORAL_TABLET | Freq: Every day | ORAL | 6 refills | Status: DC

## 2018-10-29 NOTE — Progress Notes (Signed)
Hematology and Oncology Follow Up Visit  Jessica Reilly [id number] 12/11/1960 58 y.o. 10/29/2018 8:56 AM   Principle Diagnosis: Encounter Diagnoses  Name Primary?  . Deep vein thrombosis (DVT) of upper extremity, unspecified chronicity, unspecified laterality, unspecified vein (HCC) Yes  . Coagulopathy (HCC)   . Factor V Leiden mutation (HCC)   . Chronic anticoagulation   . Lobular carcinoma of left breast, stage 2, estrogen receptor positive (HCC)   Clinical Summary:  58-year-old woman with a history of a stage II, T2 N0, ER/PR positive, HER-2 negative cancer of the right breast status post right modified radical mastectomy and elective simple left mastectomy with subsequent bilateral oophorectomy. She received postoperative radiation to the right chest wall, axilla, and supraclavicular area. She sustained a left upper extremity DVT following left breast surgery. She tested negative for the BRCA1 and BRCA 2 genes by her history.  She received neoadjuvant CEF chemotherapy for 5 cycles. Initial tumor measured 4.1 x 3.1 x 2.7 cm on mammogram done 06/25/2007. She had her surgery at [facility] in [place]. Final tumor size was 1.7 cm. Grade 3. 0 of 3 sentinel lymph nodes tested negative. She had very poor tolerance to the treatments. She was then put on hormonal therapy with Arimidex.  Due to theperioperative left upper extremity DVT (as noted surgery on the right,) lab testing was done and she was found to be a heterozygotefor the factor V Leiden gene mutation.She was anticoagulated and she has been kept on therapeutic Coumadin. One of her daughters subsequently had a DVT in her 40s and was also found to be a Leiden heterozygote. Although the risk of thrombosis on an aromatase inhibitor is less then on tamoxifen, given her history, I felt it was reasonable to continue the Coumadin until she completes a planned 5 year course of Arimidex in September 2014. She has elected to have  her Coumadin monitored through our office.  Her oncologist at [facility]. Dr. Julia Lawrence felt that the Arimidex hormonal therapy should be continued past 5 years and we now have data from clinical trials to support extending hormonal therapy for 10 years whether it be tamoxifen or an aromatase inhibitor. Dr. Lawrence left the Medical Center and her new oncologist is Dr. Stephen Sorscher.  Interim History: Other than chronic problems with her back she has had no interim medical problems since last visit with me.  She just had an MRI of her spine on January 8.  Multilevel degenerative changes in the lumbar region worse at L2-3 with potential for nerve impingement at that level. She continues to follow with medical oncology Dr.Sorscher at [facility] for her breast cancer.  She is now out over 11 years.  I told her that she could stop her Arimidex.  There are no data to suggest additional beneficial effects after 10 years of drug.  This would also allow her to stop her Coumadin.  She is reluctant to do both.  She now tells me that she had a blood clot in her right axillary vein a few years before she developed a clot on the left side.  The left side was clearly unprovoked although it was on the contralateral side from her initial mastectomy on the right. Her oncologist is getting bone density studies periodically.     Medications: reviewed  Allergies:  Allergies  Allergen Reactions  . Amoxicillin-Pot Clavulanate Rash  . Ceftin Rash  . Cefuroxime Rash  . Cefuroxime Axetil Rash  . Risperidone And   Related Other (See Comments)    Has medical contraindications    Review of Systems: See interim history Remaining ROS negative:   Physical Exam: There were no vitals taken for this visit. Wt Readings from Last 3 Encounters:  11/13/17 96.3 kg  05/25/17 96.4 kg  11/30/16 92.8 kg     General appearance: well nourished Caucasian woman. HENNT: Pharynx no erythema,  exudate, mass, or ulcer. No thyromegaly or thyroid nodules Lymph nodes: No cervical, supraclavicular, or axillary lymphadenopathy Breasts:Bilateral mastectomies. No skin lesions. Lungs: Clear to auscultation, resonant to percussion throughout Heart: Regular rhythm, no murmur, no gallop, no rub, no click, no edema Abdomen: Soft, nontender, normal bowel sounds, no mass, no organomegaly Extremities: No edema, no calf tenderness Musculoskeletal: no joint deformities GU:  Vascular: Carotid pulses 2+, no bruits, distal pulses: Dorsalis pedis 1+ symmetric Neurologic: Alert, oriented, PERRLA, , cranial nerves grossly normal, motor strength 5 over 5, reflexes 1+ symmetric biceps, absent symmetric patellae, upper body coordination normal, gait normal, Skin: No rash or ecchymosis  Lab Results: CBC W/Diff    Component Value Date/Time   WBC 8.5 05/25/2017 1345   WBC 4.1 04/19/2016 2351   RBC 4.36 05/25/2017 1345   RBC 4.59 04/19/2016 2351   HGB 13.3 05/25/2017 1345   HGB 12.9 02/20/2013 1342   HCT 38.4 05/25/2017 1345   HCT 38.8 02/20/2013 1342   PLT 307 05/25/2017 1345   MCV 88 05/25/2017 1345   MCV 88.6 02/20/2013 1342   MCH 30.5 05/25/2017 1345   MCH 30.1 04/19/2016 2351   MCHC 34.6 05/25/2017 1345   MCHC 33.9 04/19/2016 2351   RDW 15.4 05/25/2017 1345   RDW 13.9 02/20/2013 1342   LYMPHSABS 1.4 04/19/2016 2351   LYMPHSABS 2.9 02/20/2013 1342   MONOABS 0.2 04/19/2016 2351   MONOABS 0.3 02/20/2013 1342   EOSABS 0.0 04/19/2016 2351   EOSABS 0.2 02/20/2013 1342   BASOSABS 0.0 04/19/2016 2351   BASOSABS 0.0 02/20/2013 1342     Chemistry      Component Value Date/Time   NA 142 05/25/2017 1345   NA 142 02/20/2013 1342   K 4.4 05/25/2017 1345   K 4.4 02/20/2013 1342   CL 103 05/25/2017 1345   CL 105 02/20/2013 1342   CO2 24 05/25/2017 1345   CO2 26 02/20/2013 1342   BUN 10 05/25/2017 1345   BUN 10.0 02/20/2013 1342   CREATININE 1.03 (H) 05/25/2017 1345   CREATININE 1.06  06/22/2014 1043   CREATININE 1.2 (H) 02/20/2013 1342      Component Value Date/Time   CALCIUM 9.6 05/25/2017 1345   CALCIUM 9.5 02/20/2013 1342   ALKPHOS 75 05/25/2017 1345   ALKPHOS 100 02/20/2013 1342   AST 16 05/25/2017 1345   AST 29 02/20/2013 1342   ALT 18 05/25/2017 1345   ALT 33 02/20/2013 1342   BILITOT 0.3 05/25/2017 1345   BILITOT 0.37 02/20/2013 1342       Impression:  1.  History of an unprovoked left upper extremity DVT perioperatively after a right mastectomy. She now states that she had a clot in the contralateral upper extremity years before that that was not provoked.  She is a Leiden heterozygote. I still feel this is a minor risk factor.  She has been on warfarin for over 10 years.  I recommended stopping the warfarin at this time as well as her Arimidex. She does not want to do this. I told her I am retiring in April.  She will need  to make arrangements to have her Coumadin monitored.  I made a referral for her to the [facility] center.  She does have a home monitor.  She does not like to travel.  She is hoping somebody is willing to work with her the way we have and allow her to do most of the monitoring on her own.  She has a follow-up appointment with her oncologist next week.  I encouraged her to discuss the situation with him.  She does not want to travel to [facility] to get blood checked but he may be able to supervise her from a remote site like we do.  2.  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  Status post initial right mastectomy, radiation, and chemotherapy with subsequent elective left mastectomy.  She has been on prolonged hormonal therapy with Arimidex.  No evidence for recurrent disease now out over 10 years from diagnosis in September 2008.  See discussion above.  CC: Patient Care Team: Sharilyn Sites, MD as PCP - General (Family Medicine)   Murriel Hopper, MD, FACP   Hematology-Oncology/Internal Medicine     1/21/20208:56 AM

## 2018-10-29 NOTE — Progress Notes (Signed)
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 Patient has a home INR meter  Indication: DVThistory, Factor V Leiden mutation Duration: indefinite Supervising physician: Murriel Hopper  Anticoagulation Clinic Visit History: Patientdoes notreport signs/symptoms of bleeding or thromboembolism or other changes.  Anticoagulation Episode Summary    Current INR goal:   2.0-3.0  TTR:   73.1 % (6.5 y)  Next INR check:   11/25/2018  INR from last check:   2.2 (10/28/2018)  Weekly max warfarin dose:     Target end date:   04/11/2015  INR check location:     Preferred lab:     Send INR reminders to:   [facility]   Indications   DVT of upper extremity (deep vein thrombosis) (HCC) [I82.629] Factor V Leiden mutation ([place]) [D68.51] Coagulopathy ([place]) [D68.9] Chronic anticoagulation [Z79.01]       Comments:         Anticoagulation Care Providers    Provider Role Specialty Phone number   Annia Belt, MD Referring Oncology [phone]     Allergies  Allergen Reactions  . Amoxicillin-Pot Clavulanate Rash  . Ceftin Rash  . Cefuroxime Rash  . Cefuroxime Axetil Rash  . Risperidone And Related Other (See Comments)    Has medical contraindications   Medication Sig  acetaminophen (TYLENOL) 500 MG tablet Take 1,000 mg by mouth as needed.  alprazolam (XANAX) 2 MG tablet Take 2 mg by mouth 2 (two) times daily as needed (May take a 1 mg during the day but always takes 2 mg at bedtime.). For anxiety  anastrozole (ARIMIDEX) 1 MG tablet Take 1 mg by mouth daily.    Calcium Carbonate-Vit D-Min (CALCIUM 600+D3 PLUS MINERALS) 600-800 MG-UNIT TABS Take by mouth.  cyanocobalamin (,VITAMIN B-12,) 1000 MCG/ML injection Inject 100 mcg into the muscle every 30 (thirty) days.   docusate sodium (STOOL SOFTENER) 100 MG capsule Take 2 capsules (200 mg total) by mouth at bedtime.  enoxaparin (LOVENOX) 150 MG/ML  injection Inject 1 mL (150 mg total) into the skin daily. Patient not taking: Reported on 08/29/2018  esomeprazole (NEXIUM) 40 MG capsule Take 40 mg by mouth 2 (two) times daily before a meal.  fluticasone (FLONASE) 50 MCG/ACT nasal spray Place 2 sprays into the nose daily.   ibuprofen (ADVIL,MOTRIN) 800 MG tablet Take by mouth.  levocetirizine (XYZAL) 5 MG tablet TAKE ONE TABLET BY MOUTH AT BEDTIME. Patient not taking: Reported on 08/29/2018  lidocaine (LIDODERM) 5 % Place 1 patch onto the skin as needed. Remove & Discard patch within 12 hours or as directed by MD (only uses prn)  metoprolol (TOPROL-XL) 100 MG 24 hr tablet Take 100 mg by mouth daily.    omega-3 fish oil (MAXEPA) 1000 MG CAPS capsule Take 1 capsule (1,000 mg total) by mouth 2 (two) times daily.  promethazine (PHENERGAN) 25 MG tablet Take 25 mg by mouth as needed for nausea.   simvastatin (ZOCOR) 20 MG tablet Take 20 mg by mouth daily.   triazolam (HALCION) 0.25 MG tablet Take 1 tab by mouth 1 hour prior to procedure with very light food, do not drive motor vehicle.  warfarin (COUMADIN) 7.5 MG tablet Take 1 tablet (7.5 mg total) by mouth daily. Except 1/2 tablet on Tue/Thur   Past Medical History:  Diagnosis Date  . Abnormal Pap smear   . Acid reflux   . Bipolar disorder ([place]) 03/22/2013  . Breast cancer ([place])   . Breast disorder    cancer  .  Chronic anticoagulation 03/22/2013  . Coagulopathy ([place]) 04/10/2012  . Constipation   . DVT of upper extremity (deep vein thrombosis) ([place]) 04/10/2012  . Factor V Leiden ([place])   . Factor V Leiden mutation ([place]) 04/10/2012  . Family history of ovarian cancer   . Fatty liver 06/04/2017  . Fibroid   . Gallstones   . Heart murmur   . High cholesterol   . History of abnormal cervical Pap smear 01/06/2015  . History of breast cancer 01/06/2015  . HTN (hypertension)   . HX: breast cancer   . Hypertension 12/31/2012  . Kidney stones   . Kidney stones 06/04/2017  . Lobular carcinoma of  breast, estrogen receptor positive, 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  . Mental disorder    panic attacks  . Osteoarthritis   . Pain in both knees   . Vaginal Pap smear, abnormal    Social History   Socioeconomic History  . Marital status: Divorced    Spouse name: Not on file  . Number of children: Not on file  . Years of education: Not on file  . Highest education level: Not on file  Occupational History  . Not on file  Social Needs  . Financial resource strain: Not on file  . Food insecurity:    Worry: Not on file    Inability: Not on file  . Transportation needs:    Medical: Not on file    Non-medical: Not on file  Tobacco Use  . Smoking status: Never Smoker  . Smokeless tobacco: Never Used  Substance and Sexual Activity  . Alcohol use: Yes    Alcohol/week: 0.0 standard drinks    Comment: Rarely.  . Drug use: No  . Sexual activity: Never    Birth control/protection: Surgical    Comment: partial hyst  Lifestyle  . Physical activity:    Days per week: Not on file    Minutes per session: Not on file  . Stress: Not on file  Relationships  . Social connections:    Talks on phone: Not on file    Gets together: Not on file    Attends religious service: Not on file    Active member of club or organization: Not on file    Attends meetings of clubs or organizations: Not on file    Relationship status: Not on file  Other Topics Concern  . Not on file  Social History Narrative  . Not on file   Family History  Problem Relation Age of Onset  . Alcohol abuse Mother   . COPD Mother   . Alcohol abuse Father   . Heart disease Father   . Pneumonia Father   . Diabetes Father   . Colon cancer Father        Dx 60s; deceased 14s  . OCD Daughter   . Bipolar disorder Daughter   . Hypertension Daughter   . Schizophrenia Daughter   . Schizophrenia Daughter   . Bipolar disorder Daughter   . OCD Daughter   . Hypertension Daughter   . COPD Daughter   . Other Daughter        stomach  don't empty out  . ADD / ADHD Grandchild   . Anxiety disorder Grandchild   . Depression Grandchild   . Healthy Sister   . Cancer Brother        leukemia 64s; deceased 47  . Heart disease Brother   . Cancer Brother        liver; currently  43  . Cirrhosis Brother   . Other Brother        waiting for liver transplant  . Alzheimer's disease Paternal Grandmother   . Ovarian cancer Maternal Grandmother 4       deceased 52  . Cancer Maternal Uncle        3 of 6 with various cancers: skin, NHL, Hodkin's Dx  . Breast cancer Cousin 70       mat first cousin related through unaffected aunt   ASSESSMENT Lab Results  Component Value Date   INR 2.2 10/28/2018   INR 2.3 09/26/2018   INR 2.7 09/02/2018   PROTIME 27.6 (H) 07/30/2013   Anticoagulation Dosing: Description   DR [name]     INR today: Therapeutic  PLAN Weekly dose was unchanged   There are no Patient Instructions on file for this visit. Patient advised to contact clinic or seek medical attention if signs/symptoms of bleeding or thromboembolism occur.  Patient verbalized understanding by repeating back information and was advised to contact me if further medication-related questions arise. Patient was also provided an information handout.  Follow-up 4 weeks  [name]

## 2018-10-29 NOTE — Patient Instructions (Signed)
We will make a referral to [facility] to have your coumadin monitored. You have the option of stopping your coumadin and your Arimidex.  It has been a pleasure working with you. Good luck.  Dr Darnell Level

## 2018-11-06 ENCOUNTER — Ambulatory Visit (INDEPENDENT_AMBULATORY_CARE_PROVIDER_SITE_OTHER): Payer: PPO | Admitting: Physical Medicine and Rehabilitation

## 2018-11-06 ENCOUNTER — Encounter (INDEPENDENT_AMBULATORY_CARE_PROVIDER_SITE_OTHER): Payer: Self-pay | Admitting: Physical Medicine and Rehabilitation

## 2018-11-06 ENCOUNTER — Ambulatory Visit (INDEPENDENT_AMBULATORY_CARE_PROVIDER_SITE_OTHER): Payer: Self-pay

## 2018-11-06 VITALS — BP 119/55 | HR 62 | Temp 97.5°F

## 2018-11-06 DIAGNOSIS — M5416 Radiculopathy, lumbar region: Secondary | ICD-10-CM | POA: Diagnosis not present

## 2018-11-06 DIAGNOSIS — M48062 Spinal stenosis, lumbar region with neurogenic claudication: Secondary | ICD-10-CM

## 2018-11-06 DIAGNOSIS — M4125 Other idiopathic scoliosis, thoracolumbar region: Secondary | ICD-10-CM

## 2018-11-06 MED ORDER — METHYLPREDNISOLONE ACETATE 80 MG/ML IJ SUSP
80.0000 mg | Freq: Once | INTRAMUSCULAR | Status: AC
  Administered 2018-11-06: 80 mg

## 2018-11-06 NOTE — Progress Notes (Signed)
 .  Numeric Pain Rating Scale and Functional Assessment Average Pain 10   In the last MONTH (on 0-10 scale) has pain interfered with the following?  1. General activity like being  able to carry out your everyday physical activities such as walking, climbing stairs, carrying groceries, or moving a chair?  Rating(10)   +Driver, +BT(Stopped coumadin 11/02/2018) , -Dye Allergies.

## 2018-11-06 NOTE — Patient Instructions (Signed)

## 2018-11-07 ENCOUNTER — Encounter (INDEPENDENT_AMBULATORY_CARE_PROVIDER_SITE_OTHER): Payer: Self-pay | Admitting: Physical Medicine and Rehabilitation

## 2018-11-07 NOTE — Procedures (Signed)
Lumbar Epidural Steroid Injection - Interlaminar Approach with Fluoroscopic Guidance  Patient: Jessica Reilly      Date of Birth: [birth year] MRN: [id number] PCP: Sharilyn Sites, MD      Visit Date: 11/06/2018   Universal Protocol:     Consent Given By: the patient  Position: PRONE  Additional Comments: Vital signs were monitored before and after the procedure. Patient was prepped and draped in the usual sterile fashion. The correct patient, procedure, and site was verified.   Injection Procedure Details:  Procedure Site One Meds Administered:  Meds ordered this encounter  Medications  . methylPREDNISolone acetate (DEPO-MEDROL) injection 80 mg     Laterality: Right  Location/Site:  L3-L4  Needle size: 20 G  Needle type: Tuohy  Needle Placement: Paramedian epidural  Findings:   -Comments: Excellent flow of contrast into the epidural space.  Procedure Details: Using a paramedian approach from the side mentioned above, the region overlying the inferior lamina was localized under fluoroscopic visualization and the soft tissues overlying this structure were infiltrated with 4 ml. of 1% Lidocaine without Epinephrine. The Tuohy needle was inserted into the epidural space using a paramedian approach.   The epidural space was localized using loss of resistance along with lateral and bi-planar fluoroscopic views.  After negative aspirate for air, blood, and CSF, a 2 ml. volume of Isovue-250 was injected into the epidural space and the flow of contrast was observed. Radiographs were obtained for documentation purposes.    The injectate was administered into the level noted above.   Additional Comments:  No complications occurred Dressing: Band-Aid    Post-procedure details: Patient was observed during the procedure. Post-procedure instructions were reviewed.  Patient left the clinic in stable condition.

## 2018-11-07 NOTE — Progress Notes (Signed)
ELIA NUNLEY - 58 y.o. female MRN [id number]  Date of birth: [birth year]  Office Visit Note: Visit Date: 08/29/2018 PCP: Sharilyn Sites, MD Referred by: Sharilyn Sites, MD  Subjective: Chief Complaint  Patient presents with  . Lower Back - Pain  . Left Hip - Pain  . Middle Back - Pain   HPI: JANEANN PAISLEY is a 58 y.o. female who comes in today As a new patient for evaluation management of chronic worsening severe low back pain with some referral in the left buttock and leg.  Her back history is that she has had chronic back pain for many years and she has been seeing Dr. Raquel Sarna Dalton-Bethea for many years.  She reports the injections that she was receiving really had not helped as much as she had hoped.  She reports they had discussed medial branch blocks and radiofrequency ablation but had not performed this.  We did get notes to review from Dr. Ace Gins and does appear injections performed were more facet joint blocks.  She reports medications have not helped as much either.  She reports not having had MRI of the lumbar spine.  She reports physical therapy in the remote past but does not do physical therapy because it increases her pain levels greatly.  Her case is complicated by concurrent use of Coumadin which has been managed by Dr. [name].  She has factor V Leiden.  She also has a history of what is listed in the chart as bipolar affective disorder and possibly borderline personality.  She has some depression and anxiety.  She does take Xanax for anxiety.  She reports 10 out of 10 average pain across the low back which is constant sharp sometimes dull and aching.  Is worse with walking but can be worse with prolonged sitting.  Standing also is problematic.  She has to find a time to change positions.  If she can fully rest and lay down it does seem to help.  She denies any radicular pain past the hip area.  She denies any paresthesias or focal weakness.  She has had no bowel or bladder  changes and no unexplained weight loss.  Patient is obese.  She is very frustrated with the amount of pain that she is having and has not been able to get much relief.  She also has multiple other pain complaints including the shoulders which have been managed at [facility] in the past.  She has had multiple other orthopedic complaints.  Appropriate Use Addendum Patient was evaluated for low back pain and according to appropriate use criteria (did not) receive imaging tests.  Review of Systems  Constitutional: Negative for chills, fever, malaise/fatigue and weight loss.  HENT: Negative for hearing loss and sinus pain.   Eyes: Negative for blurred vision, double vision and photophobia.  Respiratory: Negative for cough and shortness of breath.   Cardiovascular: Negative for chest pain, palpitations and leg swelling.  Gastrointestinal: Negative for abdominal pain, nausea and vomiting.  Genitourinary: Negative for flank pain.  Musculoskeletal: Positive for back pain and joint pain. Negative for myalgias.  Skin: Negative for itching and rash.  Neurological: Negative for tremors, focal weakness and weakness.  Endo/Heme/Allergies: Negative.   Psychiatric/Behavioral: Positive for depression. The patient is nervous/anxious.   All other systems reviewed and are negative.  Otherwise per HPI.  Assessment & Plan: Visit Diagnoses:  1. Chronic bilateral low back pain without sciatica   2. Spondylosis without myelopathy or radiculopathy, lumbar region  3. Other idiopathic scoliosis, thoracolumbar region   4. Pre-operative anxiety     Plan: Findings:  Chronic mechanical low back pain now with several months of worsening pain into the sometimes right more than left hip sometimes more left into the right hip but no specific groin pain.  Exam is more consistent with facet mediated low back pain and possible disc herniation.  Prior x-ray imaging from several years ago particularly in 2015 did show  significant scoliosis.  I think at this point given the issues with injections by Dr. Ace Gins not really helping he does a good job with injections and current ongoing issues with severe pain and problematic issues with anticoagulation etc. it is time to look at an MRI of her lumbar spine.  We will try to get that approved.  She has had this pain for quite some time without any relief at all I think an MRI is the next step.  My concern would be for lumbar stenosis versus disc herniation coupled with the scoliosis.  Greater than 50% of this visit (total duration of visit was 45 min) was spent in counseling and coordination of care discussing lumbar scoliosis and injections and radiofrequency ablation.    Meds & Orders:  Meds ordered this encounter  Medications  . triazolam (HALCION) 0.25 MG tablet    Sig: Take 1 tab by mouth 1 hour prior to procedure with very light food, do not drive motor vehicle.    Dispense:  2 tablet    Refill:  0    Orders Placed This Encounter  Procedures  . MR LUMBAR SPINE WO CONTRAST    Follow-up: Return for MRI review after completion.   Procedures: No procedures performed  No notes on file   Clinical History: LUMBAR SPINE - COMPLETE WITH BENDING VIEWS - 7 views total  COMPARISON:  None.  FINDINGS: There is 15 degrees of levoconvex thoracolumbar scoliosis as measured between T11 and L4. Small ribs are present at the L1 level. Calcifications project over the right renal shadow.  Endplate sclerosis and loss of disc height noted at L1-2 and L2-3. Facet arthropathy noted on the left at L5-S1 and L4-5. There is a suggestion of up to 3 mm retrolisthesis at L3-4 and L2-3. These do not appreciably change with flexion or extension. No abnormal motion on bending views.  IMPRESSION: 1. 3 mm retrolisthesis at L3-4 and L2-3, not changing with flexion or extension. No abnormal motion with flexion or extension. 2. 15 degrees of levoconvex thoracolumbar  scoliosis. 3. Degenerative disc disease and endplate sclerosis at G0-1 and L2-3. Lower lumbar facet arthropathy, left greater than right. 4. Suspected right nephrolithiasis.   Electronically Signed   By: Sherryl Barters M.D.   On: 01/23/2014 14:28   She reports that she has never smoked. She has never used smokeless tobacco. No results for input(s): HGBA1C, LABURIC in the last 8760 hours.  Objective:  VS:  HT:    WT:   BMI:     BP:117/81  HR:87bpm  TEMP: ( )  RESP:  Physical Exam Vitals signs and nursing note reviewed.  Constitutional:      General: She is not in acute distress.    Appearance: Normal appearance. She is well-developed. She is obese.  HENT:     Head: Normocephalic and atraumatic.     Nose: Nose normal.     Mouth/Throat:     Mouth: Mucous membranes are moist.     Pharynx: Oropharynx is clear.  Eyes:  Conjunctiva/sclera: Conjunctivae normal.     Pupils: Pupils are equal, round, and reactive to light.  Neck:     Musculoskeletal: Normal range of motion and neck supple.  Cardiovascular:     Rate and Rhythm: Regular rhythm.  Pulmonary:     Effort: Pulmonary effort is normal. No respiratory distress.  Abdominal:     General: There is no distension.     Palpations: Abdomen is soft.     Tenderness: There is no guarding.  Musculoskeletal:     Right lower leg: Edema present.     Left lower leg: Edema present.     Comments: Patient ambulates with a forward flexed lumbar spine and stiffness and pain with facet loading and extension.  She has tender points across the lower back and some tender points on the greater trochanters bilaterally.  No pain with hip rotation internal or external.  She has good distal strength without clonus.  Has negative slump test bilaterally.  Skin:    General: Skin is warm and dry.     Findings: No erythema or rash.  Neurological:     General: No focal deficit present.     Mental Status: She is alert and oriented to person,  place, and time.     Sensory: No sensory deficit.     Motor: No weakness or abnormal muscle tone.     Coordination: Coordination normal.     Gait: Gait normal.  Psychiatric:        Mood and Affect: Mood normal.        Behavior: Behavior normal.        Thought Content: Thought content normal.     Ortho Exam Imaging: Xr C-arm No Report  Result Date: 11/06/2018 Please see Notes tab for imaging impression.   Past Medical/Family/Surgical/Social History: Medications & Allergies reviewed per EMR, new medications updated. Patient Active Problem List   Diagnosis Date Noted  . Fatty liver 06/04/2017  . Kidney stones 06/04/2017  . Depression 05/25/2017  . Fatigue 05/25/2017  . Hematuria 05/25/2017  . Proteinuria 05/25/2017  . Encounter for gynecological examination with Papanicolaou smear of cervix 05/25/2017  . Bipolar affective disorder, currently depressed, moderate ([place]) 07/25/2016  . Genetic testing 03/25/2015  . Family history of ovarian cancer   . History of breast cancer 01/06/2015  . History of abnormal cervical Pap smear 01/06/2015  . GERD (gastroesophageal reflux disease) 06/25/2013  . Gastric polyps 06/25/2013  . Personal history of colonic polyps 06/25/2013  . Chronic anticoagulation 03/22/2013  . Obesity 12/31/2012  . Hypertension 12/31/2012  . Elevated cholesterol 12/31/2012  . Generalized anxiety disorder 12/17/2012  . Borderline behavior 08/28/2012  . Coagulopathy ([place]) 04/10/2012  . Factor V Leiden mutation ([place]) 04/10/2012  . DVT of upper extremity (deep vein thrombosis) ([place]) 04/10/2012  . Lobular carcinoma of breast, stage 2, estrogen receptor positive ([place]) 04/10/2012  . Abdominal pain, left lower quadrant 12/25/2011  . OA (osteoarthritis) of knee 05/04/2011  . ARTHRITIS, KNEES, BILATERAL 09/22/2010  . 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 MEDIAL MENISCUS TEAR 11/08/2009  . KNEE, ARTHRITIS, DEGEN./OSTEO 10/28/2009  . LEG PAIN, BILATERAL 10/28/2009  . SHOULDER  PAIN 02/11/2009  . KNEE PAIN 02/11/2009  . IMPINGEMENT SYNDROME 02/11/2009  . CONSTIPATION 02/01/2009   Past Medical History:  Diagnosis Date  . Abnormal Pap smear   . Acid reflux   . Bipolar disorder ([place]) 03/22/2013  . Breast cancer ([place])   . Breast disorder    cancer  . Chronic anticoagulation 03/22/2013  .  Coagulopathy ([place]) 04/10/2012  . Constipation   . DVT of upper extremity (deep vein thrombosis) ([place]) 04/10/2012  . Factor V Leiden ([place])   . Factor V Leiden mutation ([place]) 04/10/2012  . Family history of ovarian cancer   . Fatty liver 06/04/2017  . Fibroid   . Gallstones   . Heart murmur   . High cholesterol   . History of abnormal cervical Pap smear 01/06/2015  . History of breast cancer 01/06/2015  . HTN (hypertension)   . HX: breast cancer   . Hypertension 12/31/2012  . Kidney stones   . Kidney stones 06/04/2017  . Lobular carcinoma of breast, estrogen receptor positive, 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  . Mental disorder    panic attacks  . Osteoarthritis   . Pain in both knees   . Vaginal Pap smear, abnormal    Family History  Problem Relation Age of Onset  . Alcohol abuse Mother   . COPD Mother   . Alcohol abuse Father   . Heart disease Father   . Pneumonia Father   . Diabetes Father   . Colon cancer Father        Dx 52s; deceased 105s  . OCD Daughter   . Bipolar disorder Daughter   . Hypertension Daughter   . Schizophrenia Daughter   . Schizophrenia Daughter   . Bipolar disorder Daughter   . OCD Daughter   . Hypertension Daughter   . COPD Daughter   . Other Daughter        stomach don't empty out  . ADD / ADHD Grandchild   . Anxiety disorder Grandchild   . Depression Grandchild   . Healthy Sister   . Cancer Brother        leukemia 56s; deceased 50  . Heart disease Brother   . Cancer Brother        liver; currently 82  . Cirrhosis Brother   . Other Brother        waiting for liver transplant  . Alzheimer's disease Paternal Grandmother   . Ovarian cancer  Maternal Grandmother 75       deceased 48  . Cancer Maternal Uncle        3 of 6 with various cancers: skin, NHL, Hodkin's Dx  . Breast cancer Cousin 68       mat first cousin related through unaffected aunt   Past Surgical History:  Procedure Laterality Date  . BREAST LUMPECTOMY    . COLONOSCOPY WITH ESOPHAGOGASTRODUODENOSCOPY (EGD) N/A 07/23/2013   Procedure: COLONOSCOPY WITH ESOPHAGOGASTRODUODENOSCOPY (EGD);  Surgeon: Rogene Houston, MD;  Location: [facility];  Service: Endoscopy;  Laterality: N/A;  125  . LYMPH NODE BIOPSY    . MASTECTOMY  bilateral  . ovaries removed    . TONSILLECTOMY AND ADENOIDECTOMY     Social History   Occupational History  . Not on file  Tobacco Use  . Smoking status: Never Smoker  . Smokeless tobacco: Never Used  Substance and Sexual Activity  . Alcohol use: Yes    Alcohol/week: 0.0 standard drinks    Comment: Rarely.  . Drug use: No  . Sexual activity: Never    Birth control/protection: Surgical    Comment: partial hyst

## 2018-11-07 NOTE — Progress Notes (Signed)
Jessica Reilly - 58 y.o. female MRN [id number]  Date of birth: 07/18/1961  Office Visit Note: Visit Date: 11/06/2018 PCP: Sharilyn Sites, MD Referred by: Sharilyn Sites, MD  Subjective: Chief Complaint  Patient presents with  . Lower Back - Pain   HPI:  Jessica Reilly is a 58 y.o. female who comes in today For evaluation management of her low back and right buttock pain and MRI review.  Please see our prior notes for further details and justification of her complete history.  MRI was reviewed with the patient today and shows pretty significant findings at the area of scoliosis which is at L2-3 and L3-4.  There appears to be lateral recess narrowing and some central canal narrowing due to facet arthropathy and slippage as well as disc protrusion.  I do think this is probably the source of her buttock pain.  I think her low back pain could be more mechanical with facet problems around this area as well.  Her lower spine actually around L5-S1 does not look very bad at all and there is no focal disc herniations or really any arthritis of the lower L5-S1 level.  We discussed this at length with the patient.  We had already prepared for the injection by getting her off of her Coumadin for 5 days.  She will return to taking that tonight.  ROS Otherwise per HPI.  Assessment & Plan: Visit Diagnoses:  1. Lumbar radiculopathy   2. Other idiopathic scoliosis, thoracolumbar region   3. Spinal stenosis of lumbar region with neurogenic claudication     Plan: No additional findings.   Meds & Orders:  Meds ordered this encounter  Medications  . methylPREDNISolone acetate (DEPO-MEDROL) injection 80 mg    Orders Placed This Encounter  Procedures  . XR C-ARM NO REPORT  . Epidural Steroid injection    Follow-up: Return if symptoms worsen or fail to improve.   Procedures: No procedures performed  Lumbar Epidural Steroid Injection - Interlaminar Approach with Fluoroscopic Guidance  Patient:  Jessica Reilly      Date of Birth: 1960/12/27 MRN: [id number] PCP: Sharilyn Sites, MD      Visit Date: 11/06/2018   Universal Protocol:     Consent Given By: the patient  Position: PRONE  Additional Comments: Vital signs were monitored before and after the procedure. Patient was prepped and draped in the usual sterile fashion. The correct patient, procedure, and site was verified.   Injection Procedure Details:  Procedure Site One Meds Administered:  Meds ordered this encounter  Medications  . methylPREDNISolone acetate (DEPO-MEDROL) injection 80 mg     Laterality: Right  Location/Site:  L3-L4  Needle size: 20 G  Needle type: Tuohy  Needle Placement: Paramedian epidural  Findings:   -Comments: Excellent flow of contrast into the epidural space.  Procedure Details: Using a paramedian approach from the side mentioned above, the region overlying the inferior lamina was localized under fluoroscopic visualization and the soft tissues overlying this structure were infiltrated with 4 ml. of 1% Lidocaine without Epinephrine. The Tuohy needle was inserted into the epidural space using a paramedian approach.   The epidural space was localized using loss of resistance along with lateral and bi-planar fluoroscopic views.  After negative aspirate for air, blood, and CSF, a 2 ml. volume of Isovue-250 was injected into the epidural space and the flow of contrast was observed. Radiographs were obtained for documentation purposes.    The injectate was administered into the level noted  above.   Additional Comments:  No complications occurred Dressing: Band-Aid    Post-procedure details: Patient was observed during the procedure. Post-procedure instructions were reviewed.  Patient left the clinic in stable condition.   Clinical History: MRI LUMBAR SPINE WITHOUT CONTRAST  TECHNIQUE: Multiplanar, multisequence MR imaging of the lumbar spine was performed. No intravenous  contrast was administered.  COMPARISON:  CT abdomen 07/23/2012  FINDINGS: Segmentation:  5 lumbar type vertebral bodies.  Alignment: Curvature convex to the left with the apex at L1-2 and convex to the right with the apex at L5. 2 mm retrolisthesis at L2-3.  Vertebrae: No fracture or primary bone lesion. Discogenic marrow changes affecting the L1 and L2 vertebral bodies, more on the right.  Conus medullaris and cauda equina: Conus extends to the L1 level. Conus and cauda equina appear normal.  Paraspinal and other soft tissues: Negative  Disc levels:  T12-L1: Mild noncompressive disc bulge.  Mild facet hypertrophy.  L1-2: Disc degeneration worse on the right with endplate osteophytes and bulging of the disc. Discogenic marrow change on the right. Facet degeneration right more than left. Right foraminal narrowing that could affect the right L1 nerve.  L2-3: 2 mm retrolisthesis. Disc degeneration with endplate osteophytes and shallow protrusion of the disc more towards the right. Facet and ligamentous hypertrophy. Spinal stenosis at this level which could cause neural compression on either or both sides. Canal stenosis is worse on the right. Foraminal stenosis on the right could affect the right L2 nerve.  L3-4: Mild bulging of the disc. Facet and ligamentous hypertrophy. Mild lateral recess narrowing without visible neural compression.  L4-5: Mild bulging of the disc. Bilateral facet and ligamentous hypertrophy. Narrowing of the lateral recesses and foramina left more than right. Definite neural compression is not established however.  L5-S1: Mild bulging of the disc. Facet osteoarthritis left more than right. Mild narrowing of the subarticular lateral recess and intervertebral foramen on the left but without likely neural compression.  IMPRESSION: Curvature convex to the left with the apex at L1-2. Right-sided canal and foraminal stenosis worse at L2-3  than at L1-2 with potential for neural compression, particularly at the L2-3 level. Right-sided discogenic marrow changes at L1-2 could contribute to regional back pain.   Electronically Signed   By: Nelson Chimes M.D.   On: 10/16/2018 14:33  LUMBAR SPINE - COMPLETE WITH BENDING VIEWS - 7 views total  COMPARISON: None.  FINDINGS: There is 15 degrees of levoconvex thoracolumbar scoliosis as measured between T11 and L4. Small ribs are present at the L1 level. Calcifications project over the right renal shadow.  Endplate sclerosis and loss of disc height noted at L1-2 and L2-3. Facet arthropathy noted on the left at L5-S1 and L4-5. There is a suggestion of up to 3 mm retrolisthesis at L3-4 and L2-3. These do not appreciably change with flexion or extension. No abnormal motion on bending views.  IMPRESSION: 1. 3 mm retrolisthesis at L3-4 and L2-3, not changing with flexion or extension. No abnormal motion with flexion or extension. 2. 15 degrees of levoconvex thoracolumbar scoliosis. 3. Degenerative disc disease and endplate sclerosis at Y1-0 and L2-3. Lower lumbar facet arthropathy, left greater than right. 4. Suspected right nephrolithiasis.   Electronically Signed By: Sherryl Barters M.D. On: 01/23/2014 14:28     Objective:  VS:  HT:    WT:   BMI:     BP:(!) 119/55  HR:62bpm  TEMP:(!) 97.5 F (36.4 C)(Oral)  RESP:  Physical Exam  Ortho Exam  Imaging: Xr C-arm No Report  Result Date: 11/06/2018 Please see Notes tab for imaging impression.

## 2018-11-11 ENCOUNTER — Telehealth: Payer: Self-pay | Admitting: *Deleted

## 2018-11-11 ENCOUNTER — Encounter: Payer: Self-pay | Admitting: Pharmacist

## 2018-11-11 DIAGNOSIS — I82629 Acute embolism and thrombosis of deep veins of unspecified upper extremity: Secondary | ICD-10-CM

## 2018-11-11 DIAGNOSIS — Z7901 Long term (current) use of anticoagulants: Secondary | ICD-10-CM

## 2018-11-11 DIAGNOSIS — D6851 Activated protein C resistance: Secondary | ICD-10-CM

## 2018-11-11 DIAGNOSIS — D689 Coagulation defect, unspecified: Secondary | ICD-10-CM

## 2018-11-11 LAB — POCT INR: INR: 1.9 — AB (ref 2.0–3.0)

## 2018-11-11 NOTE — Progress Notes (Signed)
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 Patient has a home INR meter  Indication: DVThistory, Factor V Leiden mutation Duration: indefinite Supervising physician: Murriel Hopper  Anticoagulation Clinic Visit History: Patientdoes notreport signs/symptoms of bleeding or thromboembolism or other changes.  Anticoagulation Episode Summary    Current INR goal:   2.0-3.0  TTR:   73.1 % (6.6 y)  Next INR check:   11/14/2018  INR from last check:   1.9! (11/11/2018)  Weekly max warfarin dose:     Target end date:   04/11/2015  INR check location:     Preferred lab:     Send INR reminders to:   [facility]   Indications   DVT of upper extremity (deep vein thrombosis) ([place]) [I82.629] Factor V Leiden mutation ([place]) [D68.51] Coagulopathy ([place]) [D68.9] Chronic anticoagulation [Z79.01]       Comments:         Anticoagulation Care Providers    Provider Role Specialty Phone number   Annia Belt, MD Referring Oncology [phone]     Allergies  Allergen Reactions  . Amoxicillin-Pot Clavulanate Rash  . Ceftin Rash  . Cefuroxime Rash  . Cefuroxime Axetil Rash  . Risperidone And Related Other (See Comments)    Has medical contraindications   Medication Sig  acetaminophen (TYLENOL) 500 MG tablet Take 1,000 mg by mouth as needed.  alprazolam (XANAX) 2 MG tablet Take 2 mg by mouth 2 (two) times daily as needed (May take a 1 mg during the day but always takes 2 mg at bedtime.). For anxiety  anastrozole (ARIMIDEX) 1 MG tablet Take 1 mg by mouth daily.    Calcium Carbonate-Vit D-Min (CALCIUM 600+D3 PLUS MINERALS) 600-800 MG-UNIT TABS Take by mouth.  cyanocobalamin (,VITAMIN B-12,) 1000 MCG/ML injection Inject 100 mcg into the muscle every 30 (thirty) days.   docusate sodium (STOOL SOFTENER) 100 MG capsule Take 2 capsules (200 mg total) by mouth at bedtime.  enoxaparin (LOVENOX) 150 MG/ML  injection Inject 1 mL (150 mg total) into the skin daily.  esomeprazole (NEXIUM) 40 MG capsule Take 40 mg by mouth 2 (two) times daily before a meal.  fluticasone (FLONASE) 50 MCG/ACT nasal spray Place 2 sprays into the nose daily.   ibuprofen (ADVIL,MOTRIN) 800 MG tablet Take by mouth.  levocetirizine (XYZAL) 5 MG tablet TAKE ONE TABLET BY MOUTH AT BEDTIME.  lidocaine (LIDODERM) 5 % Place 1 patch onto the skin as needed. Remove & Discard patch within 12 hours or as directed by MD (only uses prn)  metoprolol (TOPROL-XL) 100 MG 24 hr tablet Take 100 mg by mouth daily.    omega-3 fish oil (MAXEPA) 1000 MG CAPS capsule Take 1 capsule (1,000 mg total) by mouth 2 (two) times daily.  promethazine (PHENERGAN) 25 MG tablet Take 25 mg by mouth as needed for nausea.   simvastatin (ZOCOR) 20 MG tablet Take 20 mg by mouth daily.   triazolam (HALCION) 0.25 MG tablet Take 1 tab by mouth 1 hour prior to procedure with very light food, do not drive motor vehicle.  warfarin (COUMADIN) 7.5 MG tablet Take 1 tablet (7.5 mg total) by mouth daily. Except 1/2 tablet on Tue/Thur   Past Medical History:  Diagnosis Date  . Abnormal Pap smear   . Acid reflux   . Bipolar disorder ([place]) 03/22/2013  . Breast cancer ([place])   . Breast disorder    cancer  . Chronic anticoagulation 03/22/2013  . Coagulopathy ([place]) 04/10/2012  .  Constipation   . DVT of upper extremity (deep vein thrombosis) ([place]) 04/10/2012  . Factor V Leiden ([place])   . Factor V Leiden mutation ([place]) 04/10/2012  . Family history of ovarian cancer   . Fatty liver 06/04/2017  . Fibroid   . Gallstones   . Heart murmur   . High cholesterol   . History of abnormal cervical Pap smear 01/06/2015  . History of breast cancer 01/06/2015  . HTN (hypertension)   . HX: breast cancer   . Hypertension 12/31/2012  . Kidney stones   . Kidney stones 06/04/2017  . Lobular carcinoma of breast, estrogen receptor positive, 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  . Mental disorder    panic attacks  .  Osteoarthritis   . Pain in both knees   . Vaginal Pap smear, abnormal    Social History   Socioeconomic History  . Marital status: Divorced    Spouse name: Not on file  . Number of children: Not on file  . Years of education: Not on file  . Highest education level: Not on file  Occupational History  . Not on file  Social Needs  . Financial resource strain: Not on file  . Food insecurity:    Worry: Not on file    Inability: Not on file  . Transportation needs:    Medical: Not on file    Non-medical: Not on file  Tobacco Use  . Smoking status: Never Smoker  . Smokeless tobacco: Never Used  Substance and Sexual Activity  . Alcohol use: Yes    Alcohol/week: 0.0 standard drinks    Comment: Rarely.  . Drug use: No  . Sexual activity: Never    Birth control/protection: Surgical    Comment: partial hyst  Lifestyle  . Physical activity:    Days per week: Not on file    Minutes per session: Not on file  . Stress: Not on file  Relationships  . Social connections:    Talks on phone: Not on file    Gets together: Not on file    Attends religious service: Not on file    Active member of club or organization: Not on file    Attends meetings of clubs or organizations: Not on file    Relationship status: Not on file  Other Topics Concern  . Not on file  Social History Narrative  . Not on file   Family History  Problem Relation Age of Onset  . Alcohol abuse Mother   . COPD Mother   . Alcohol abuse Father   . Heart disease Father   . Pneumonia Father   . Diabetes Father   . Colon cancer Father        Dx 53s; deceased 57s  . OCD Daughter   . Bipolar disorder Daughter   . Hypertension Daughter   . Schizophrenia Daughter   . Schizophrenia Daughter   . Bipolar disorder Daughter   . OCD Daughter   . Hypertension Daughter   . COPD Daughter   . Other Daughter        stomach don't empty out  . ADD / ADHD Grandchild   . Anxiety disorder Grandchild   . Depression  Grandchild   . Healthy Sister   . Cancer Brother        leukemia 19s; deceased 70  . Heart disease Brother   . Cancer Brother        liver; currently 43  . Cirrhosis Brother   . Other Brother  waiting for liver transplant  . Alzheimer's disease Paternal Grandmother   . Ovarian cancer Maternal Grandmother 20       deceased 19  . Cancer Maternal Uncle        3 of 6 with various cancers: skin, NHL, Hodkin's Dx  . Breast cancer Cousin 6       mat first cousin related through unaffected aunt   ASSESSMENT Lab Results  Component Value Date   INR 1.9 (A) 11/11/2018   INR 2.2 10/28/2018   INR 2.3 09/26/2018   PROTIME 27.6 (H) 07/30/2013   Anticoagulation Dosing: Description   DR [id number] PATIENT     INR today: slightly subtherapeutic  PLAN Patient reports a steroid injection last week for which she held 3 doses of warfarin. She re-started her normal warfarin dose 5 days ago. No changes today, re-check in 3 days.  There are no Patient Instructions on file for this visit.   Patient advised to contact clinic or seek medical attention if signs/symptoms of bleeding or thromboembolism occur.  Patient verbalized understanding by repeating back information and was advised to contact me if further medication-related questions arise. Patient was also provided an information handout.  Follow-up 3 days  [name]

## 2018-11-11 NOTE — Progress Notes (Signed)
Reviewed & agree Thanks DrG 

## 2018-11-11 NOTE — Telephone Encounter (Signed)
rec'd INR fax from [facility]:  2/3 at 1015 INR= 1.9  Sending to dr's kim and granfortuna Will give paper copy to dr Maudie Mercury 11/12/2018

## 2018-11-19 DIAGNOSIS — H2589 Other age-related cataract: Secondary | ICD-10-CM | POA: Diagnosis not present

## 2018-11-19 DIAGNOSIS — H2521 Age-related cataract, morgagnian type, right eye: Secondary | ICD-10-CM | POA: Diagnosis not present

## 2018-11-19 DIAGNOSIS — H25811 Combined forms of age-related cataract, right eye: Secondary | ICD-10-CM | POA: Diagnosis not present

## 2018-11-27 LAB — PROTIME-INR

## 2018-12-04 ENCOUNTER — Telehealth (INDEPENDENT_AMBULATORY_CARE_PROVIDER_SITE_OTHER): Payer: PPO | Admitting: Pharmacist

## 2018-12-04 DIAGNOSIS — D689 Coagulation defect, unspecified: Secondary | ICD-10-CM

## 2018-12-04 DIAGNOSIS — Z86718 Personal history of other venous thrombosis and embolism: Secondary | ICD-10-CM | POA: Diagnosis not present

## 2018-12-04 DIAGNOSIS — D6851 Activated protein C resistance: Secondary | ICD-10-CM | POA: Diagnosis not present

## 2018-12-04 DIAGNOSIS — I82629 Acute embolism and thrombosis of deep veins of unspecified upper extremity: Secondary | ICD-10-CM

## 2018-12-04 DIAGNOSIS — Z7901 Long term (current) use of anticoagulants: Secondary | ICD-10-CM

## 2018-12-04 LAB — POCT INR: INR: 1.9 — AB (ref 2.0–3.0)

## 2018-12-04 NOTE — Patient Instructions (Signed)
Patient educated about medication as defined in this encounter and verbalized understanding by repeating back instructions provided.   

## 2018-12-04 NOTE — Progress Notes (Signed)
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 Patient has a home INR meter  Indication: DVThistory, Factor V Leiden mutation Duration: indefinite Supervising physician: Murriel Hopper  Anticoagulation Clinic Visit History: Patientdoes notreport signs/symptoms of bleeding or thromboembolism or other changes.  Anticoagulation Episode Summary    Current INR goal:   2.0-3.0  TTR:   72.4 % (6.6 y)  Next INR check:   12/18/2018  INR from last check:   1.9! (12/04/2018)  Weekly max warfarin dose:     Target end date:   04/11/2015  INR check location:     Preferred lab:     Send INR reminders to:   [facility]   Indications   DVT of upper extremity (deep vein thrombosis) ([place]) [I82.629] Factor V Leiden mutation ([place]) [D68.51] Coagulopathy ([place]) [D68.9] Chronic anticoagulation [Z79.01]       Comments:         Anticoagulation Care Providers    Provider Role Specialty Phone number   Annia Belt, MD Referring Oncology [phone]     Allergies  Allergen Reactions  . Amoxicillin-Pot Clavulanate Rash  . Ceftin Rash  . Cefuroxime Rash  . Cefuroxime Axetil Rash  . Risperidone And Related Other (See Comments)    Has medical contraindications   Medication Sig  acetaminophen (TYLENOL) 500 MG tablet Take 1,000 mg by mouth as needed.  alprazolam (XANAX) 2 MG tablet Take 2 mg by mouth 2 (two) times daily as needed (May take a 1 mg during the day but always takes 2 mg at bedtime.). For anxiety  anastrozole (ARIMIDEX) 1 MG tablet Take 1 mg by mouth daily.    Calcium Carbonate-Vit D-Min (CALCIUM 600+D3 PLUS MINERALS) 600-800 MG-UNIT TABS Take by mouth.  cyanocobalamin (,VITAMIN B-12,) 1000 MCG/ML injection Inject 100 mcg into the muscle every 30 (thirty) days.   docusate sodium (STOOL SOFTENER) 100 MG capsule Take 2 capsules (200 mg total) by mouth at bedtime.  enoxaparin (LOVENOX) 150 MG/ML  injection Inject 1 mL (150 mg total) into the skin daily.  esomeprazole (NEXIUM) 40 MG capsule Take 40 mg by mouth 2 (two) times daily before a meal.  fluticasone (FLONASE) 50 MCG/ACT nasal spray Place 2 sprays into the nose daily.   ibuprofen (ADVIL,MOTRIN) 800 MG tablet Take by mouth.  levocetirizine (XYZAL) 5 MG tablet TAKE ONE TABLET BY MOUTH AT BEDTIME.  lidocaine (LIDODERM) 5 % Place 1 patch onto the skin as needed. Remove & Discard patch within 12 hours or as directed by MD (only uses prn)  metoprolol (TOPROL-XL) 100 MG 24 hr tablet Take 100 mg by mouth daily.    omega-3 fish oil (MAXEPA) 1000 MG CAPS capsule Take 1 capsule (1,000 mg total) by mouth 2 (two) times daily.  promethazine (PHENERGAN) 25 MG tablet Take 25 mg by mouth as needed for nausea.   simvastatin (ZOCOR) 20 MG tablet Take 20 mg by mouth daily.   triazolam (HALCION) 0.25 MG tablet Take 1 tab by mouth 1 hour prior to procedure with very light food, do not drive motor vehicle.  warfarin (COUMADIN) 7.5 MG tablet Take 1 tablet (7.5 mg total) by mouth daily. Except 1/2 tablet on Tue/Thur   Past Medical History:  Diagnosis Date  . Abnormal Pap smear   . Acid reflux   . Bipolar disorder ([place]) 03/22/2013  . Breast cancer ([place])   . Breast disorder    cancer  . Chronic anticoagulation 03/22/2013  . Coagulopathy ([place]) 04/10/2012  .  Constipation   . DVT of upper extremity (deep vein thrombosis) ([place]) 04/10/2012  . Factor V Leiden ([place])   . Factor V Leiden mutation ([place]) 04/10/2012  . Family history of ovarian cancer   . Fatty liver 06/04/2017  . Fibroid   . Gallstones   . Heart murmur   . High cholesterol   . History of abnormal cervical Pap smear 01/06/2015  . History of breast cancer 01/06/2015  . HTN (hypertension)   . HX: breast cancer   . Hypertension 12/31/2012  . Kidney stones   . Kidney stones 06/04/2017  . Lobular carcinoma of breast, estrogen receptor positive, 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  . Mental disorder    panic attacks  .  Osteoarthritis   . Pain in both knees   . Vaginal Pap smear, abnormal    Social History   Socioeconomic History  . Marital status: Divorced    Spouse name: Not on file  . Number of children: Not on file  . Years of education: Not on file  . Highest education level: Not on file  Occupational History  . Not on file  Social Needs  . Financial resource strain: Not on file  . Food insecurity:    Worry: Not on file    Inability: Not on file  . Transportation needs:    Medical: Not on file    Non-medical: Not on file  Tobacco Use  . Smoking status: Never Smoker  . Smokeless tobacco: Never Used  Substance and Sexual Activity  . Alcohol use: Yes    Alcohol/week: 0.0 standard drinks    Comment: Rarely.  . Drug use: No  . Sexual activity: Never    Birth control/protection: Surgical    Comment: partial hyst  Lifestyle  . Physical activity:    Days per week: Not on file    Minutes per session: Not on file  . Stress: Not on file  Relationships  . Social connections:    Talks on phone: Not on file    Gets together: Not on file    Attends religious service: Not on file    Active member of club or organization: Not on file    Attends meetings of clubs or organizations: Not on file    Relationship status: Not on file  Other Topics Concern  . Not on file  Social History Narrative  . Not on file   Family History  Problem Relation Age of Onset  . Alcohol abuse Mother   . COPD Mother   . Alcohol abuse Father   . Heart disease Father   . Pneumonia Father   . Diabetes Father   . Colon cancer Father        Dx 87s; deceased 41s  . OCD Daughter   . Bipolar disorder Daughter   . Hypertension Daughter   . Schizophrenia Daughter   . Schizophrenia Daughter   . Bipolar disorder Daughter   . OCD Daughter   . Hypertension Daughter   . COPD Daughter   . Other Daughter        stomach don't empty out  . ADD / ADHD Grandchild   . Anxiety disorder Grandchild   . Depression  Grandchild   . Healthy Sister   . Cancer Brother        leukemia 51s; deceased 42  . Heart disease Brother   . Cancer Brother        liver; currently 75  . Cirrhosis Brother   . Other Brother  waiting for liver transplant  . Alzheimer's disease Paternal Grandmother   . Ovarian cancer Maternal Grandmother 31       deceased 4  . Cancer Maternal Uncle        3 of 6 with various cancers: skin, NHL, Hodkin's Dx  . Breast cancer Cousin 59       mat first cousin related through unaffected aunt   ASSESSMENT Lab Results  Component Value Date   INR 1.9 (A) 12/04/2018   INR 1.9 (A) 11/11/2018   INR 2.2 10/28/2018   PROTIME 27.6 (H) 07/30/2013   Anticoagulation Dosing: Description   DR [id number] PATIENT     INR today: slightly subtherapeutic  PLAN Weekly dose was unchanged, re-check in 2 weeks  Patient Instructions  Patient educated about medication as defined in this encounter and verbalized understanding by repeating back instructions provided.    Patient advised to contact clinic or seek medical attention if signs/symptoms of bleeding or thromboembolism occur.  Patient verbalized understanding by repeating back information and was advised to contact me if further medication-related questions arise. Patient was also provided an information handout.  Follow-up 2 weeks  [name]

## 2018-12-05 NOTE — Telephone Encounter (Signed)
Reviewed Thanks DrG 

## 2018-12-06 ENCOUNTER — Telehealth: Payer: Self-pay | Admitting: Hematology and Oncology

## 2018-12-06 ENCOUNTER — Encounter: Payer: Self-pay | Admitting: Hematology and Oncology

## 2018-12-06 NOTE — Telephone Encounter (Signed)
A new patient appt has been scheduled for the pt to see Dr. Lindi Adie on 3/19 at 345pm. Letter mailed.

## 2018-12-12 DIAGNOSIS — Z6838 Body mass index (BMI) 38.0-38.9, adult: Secondary | ICD-10-CM | POA: Diagnosis not present

## 2018-12-12 DIAGNOSIS — Z1389 Encounter for screening for other disorder: Secondary | ICD-10-CM | POA: Diagnosis not present

## 2018-12-12 DIAGNOSIS — J309 Allergic rhinitis, unspecified: Secondary | ICD-10-CM | POA: Diagnosis not present

## 2018-12-12 DIAGNOSIS — J019 Acute sinusitis, unspecified: Secondary | ICD-10-CM | POA: Diagnosis not present

## 2018-12-18 DIAGNOSIS — Z7901 Long term (current) use of anticoagulants: Secondary | ICD-10-CM | POA: Diagnosis not present

## 2018-12-18 DIAGNOSIS — C50911 Malignant neoplasm of unspecified site of right female breast: Secondary | ICD-10-CM | POA: Diagnosis not present

## 2018-12-18 DIAGNOSIS — Z9079 Acquired absence of other genital organ(s): Secondary | ICD-10-CM | POA: Diagnosis not present

## 2018-12-18 DIAGNOSIS — D6851 Activated protein C resistance: Secondary | ICD-10-CM | POA: Diagnosis not present

## 2018-12-18 DIAGNOSIS — Z8249 Family history of ischemic heart disease and other diseases of the circulatory system: Secondary | ICD-10-CM | POA: Diagnosis not present

## 2018-12-18 DIAGNOSIS — Z90722 Acquired absence of ovaries, bilateral: Secondary | ICD-10-CM | POA: Diagnosis not present

## 2018-12-18 DIAGNOSIS — Z79811 Long term (current) use of aromatase inhibitors: Secondary | ICD-10-CM | POA: Diagnosis not present

## 2018-12-18 DIAGNOSIS — I2510 Atherosclerotic heart disease of native coronary artery without angina pectoris: Secondary | ICD-10-CM | POA: Diagnosis not present

## 2018-12-18 DIAGNOSIS — Z170 Estrogen receptor positive status [ER+]: Secondary | ICD-10-CM | POA: Diagnosis not present

## 2018-12-18 DIAGNOSIS — R079 Chest pain, unspecified: Secondary | ICD-10-CM | POA: Diagnosis not present

## 2018-12-18 DIAGNOSIS — I350 Nonrheumatic aortic (valve) stenosis: Secondary | ICD-10-CM | POA: Diagnosis not present

## 2018-12-18 DIAGNOSIS — Z9013 Acquired absence of bilateral breasts and nipples: Secondary | ICD-10-CM | POA: Diagnosis not present

## 2018-12-18 DIAGNOSIS — E669 Obesity, unspecified: Secondary | ICD-10-CM | POA: Diagnosis not present

## 2018-12-18 DIAGNOSIS — F329 Major depressive disorder, single episode, unspecified: Secondary | ICD-10-CM | POA: Diagnosis not present

## 2018-12-18 DIAGNOSIS — Z923 Personal history of irradiation: Secondary | ICD-10-CM | POA: Diagnosis not present

## 2018-12-24 ENCOUNTER — Ambulatory Visit (INDEPENDENT_AMBULATORY_CARE_PROVIDER_SITE_OTHER): Payer: Self-pay | Admitting: Internal Medicine

## 2018-12-25 NOTE — Progress Notes (Signed)
[facility] CONSULT NOTE  Patient Care Team: Sharilyn Sites, MD as PCP - General (Family Medicine)  CHIEF COMPLAINTS/PURPOSE OF CONSULTATION: History of breast cancer and DVT  HISTORY OF PRESENTING ILLNESS:  Jessica Reilly 58 y.o. female is here because of a history of right breast cancer in 2009, ER/PR positive, HER2 negative, treated with bilateral mastectomies, adjuvant chemotherapy, and adjuvant radiation. She is currently on Arimidex and has been tolerating it well for about 11 years and is adamantly opposed to stopping it. She was later diagnosed with a DVT and factor V Leiden mutation and is currently on Coumadin 7.36m daily, with half a dose on Tuesday and Thursday. She presents to the clinic today with a family member. She has a history of oophorectomy. She denies any family history of breast cancer and had negative genetic testing done several years ago. She has previously been a patient of Dr. GBeryle Beams   I reviewed her records extensively and collaborated the history with the patient.  SUMMARY OF ONCOLOGIC HISTORY:   Malignant neoplasm of upper-outer quadrant of right breast in female, estrogen receptor positive ([place]   2008 Initial Diagnosis    T2N0, ER PR positive, HER-2 negative, invasive ductal carcinoma of the right breast.    Status post bilateral mastectomies, radiation and antiestrogen therapy since 2009 [facility]    2009 -  Anti-estrogen oral therapy    Anastrozole 1 mg daily.  Plan is to treat her indefinitely.  Patient understands risks and benefits.  There is no data for long-term antiestrogen therapy but she is very anxious to come off of it.     MEDICAL HISTORY:  Past Medical History:  Diagnosis Date   Abnormal Pap smear    Acid reflux    Bipolar disorder ([facility] 03/22/2013   Breast cancer ([facility]    Breast disorder    cancer   Chronic anticoagulation 03/22/2013   Coagulopathy ([place] 04/10/2012    Constipation    DVT of upper extremity (deep vein thrombosis) ([facility] 04/10/2012   Factor V Leiden ([facility]    Factor [place] Leiden mutation ([facility] 04/10/2012   Family history of ovarian cancer    Fatty liver 06/04/2017   Fibroid    Gallstones    Heart murmur    High cholesterol    History of abnormal cervical Pap smear 01/06/2015   History of breast cancer 01/06/2015   HTN (hypertension)    HX: breast cancer    Hypertension 12/31/2012   Kidney stones    Kidney stones 06/04/2017   Lobular carcinoma of breast, estrogen receptor positive, 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   Mental disorder    panic attacks   Osteoarthritis    Pain in both knees    Vaginal Pap smear, abnormal     SURGICAL HISTORY: Past Surgical History:  Procedure Laterality Date   BREAST LUMPECTOMY     COLONOSCOPY WITH ESOPHAGOGASTRODUODENOSCOPY (EGD) N/A 07/23/2013   Procedure: COLONOSCOPY WITH ESOPHAGOGASTRODUODENOSCOPY (EGD);  Surgeon: NRogene Houston MD;  [facility];  Service: Endoscopy;  Laterality: N/A;  125   LYMPH NODE BIOPSY     MASTECTOMY  bilateral   ovaries removed     TONSILLECTOMY AND ADENOIDECTOMY      SOCIAL HISTORY: Social History   Socioeconomic History   Marital status: Divorced    Spouse name: Not on file   Number of children: Not on file   Years of education: Not on file   Highest education level: Not  on file  Occupational History   Not on file  Social Needs   Financial resource strain: Not on file   Food insecurity:    Worry: Not on file    Inability: Not on file   Transportation needs:    Medical: Not on file    Non-medical: Not on file  Tobacco Use   Smoking status: Never Smoker   Smokeless tobacco: Never Used  Substance and Sexual Activity   Alcohol use: Yes    Alcohol/week: 0.0 standard drinks    Comment: Rarely.   Drug use: No   Sexual activity: Never    Birth control/protection: Surgical    Comment: partial hyst  Lifestyle   Physical  activity:    Days per week: Not on file    Minutes per session: Not on file   Stress: Not on file  Relationships   Social connections:    Talks on phone: Not on file    Gets together: Not on file    Attends religious service: Not on file    Active member of club or organization: Not on file    Attends meetings of clubs or organizations: Not on file    Relationship status: Not on file   Intimate partner violence:    Fear of current or ex partner: Not on file    Emotionally abused: Not on file    Physically abused: Not on file    Forced sexual activity: Not on file  Other Topics Concern   Not on file  Social History Narrative   Not on file    FAMILY HISTORY: Family History  Problem Relation Age of Onset   Alcohol abuse Mother    COPD Mother    Alcohol abuse Father    Heart disease Father    Pneumonia Father    Diabetes Father    Colon cancer Father        Dx 23s; deceased 34s   OCD Daughter    Bipolar disorder Daughter    Hypertension Daughter    Schizophrenia Daughter    Schizophrenia Daughter    Bipolar disorder Daughter    OCD Daughter    Hypertension Daughter    COPD Daughter    Other Daughter        stomach don't empty out   ADD / ADHD Grandchild    Anxiety disorder Grandchild    Depression Grandchild    Healthy Sister    Cancer Brother        leukemia 34s; deceased 36   Heart disease Brother    Cancer Brother        liver; currently 98   Cirrhosis Brother    Other Brother        waiting for liver transplant   Alzheimer's disease Paternal Grandmother    Ovarian cancer Maternal Grandmother 73       deceased 74   Cancer Maternal Uncle        3 of 6 with various cancers: skin, NHL, Hodkin's Dx   Breast cancer Cousin 32       mat first cousin related through unaffected aunt    ALLERGIES:  is allergic to amoxicillin-pot clavulanate; ceftin; cefuroxime; cefuroxime axetil; and risperidone and related.  MEDICATIONS:    Current Outpatient Medications  Medication Sig Dispense Refill   acetaminophen (TYLENOL) 500 MG tablet Take 1,000 mg by mouth as needed.     alprazolam (XANAX) 2 MG tablet Take 2 mg by mouth 2 (two) times daily as  needed (May take a 1 mg during the day but always takes 2 mg at bedtime.). For anxiety     anastrozole (ARIMIDEX) 1 MG tablet Take 1 tablet (1 mg total) by mouth daily. 90 tablet 3   Calcium Carbonate-Vit D-Min (CALCIUM 600+D3 PLUS MINERALS) 600-800 MG-UNIT TABS Take by mouth.     cyanocobalamin (,VITAMIN B-12,) 1000 MCG/ML injection Inject 100 mcg into the muscle every 30 (thirty) days.      docusate sodium (STOOL SOFTENER) 100 MG capsule Take 2 capsules (200 mg total) by mouth at bedtime. 10 capsule 0   enoxaparin (LOVENOX) 150 MG/ML injection Inject 1 mL (150 mg total) into the skin daily. 30 Syringe 0   esomeprazole (NEXIUM) 40 MG capsule Take 40 mg by mouth 2 (two) times daily before a meal.     fluticasone (FLONASE) 50 MCG/ACT nasal spray Place 2 sprays into the nose daily.      ibuprofen (ADVIL,MOTRIN) 800 MG tablet Take by mouth.     levocetirizine (XYZAL) 5 MG tablet TAKE ONE TABLET BY MOUTH AT BEDTIME. 30 tablet 3   lidocaine (LIDODERM) 5 % Place 1 patch onto the skin as needed. Remove & Discard patch within 12 hours or as directed by MD (only uses prn)     metoprolol (TOPROL-XL) 100 MG 24 hr tablet Take 100 mg by mouth daily.       omega-3 fish oil (MAXEPA) 1000 MG CAPS capsule Take 1 capsule (1,000 mg total) by mouth 2 (two) times daily. 180 each 3   promethazine (PHENERGAN) 25 MG tablet Take 25 mg by mouth as needed for nausea.      simvastatin (ZOCOR) 20 MG tablet Take 20 mg by mouth daily.      triazolam (HALCION) 0.25 MG tablet Take 1 tab by mouth 1 hour prior to procedure with very light food, do not drive motor vehicle. 2 tablet 0   warfarin (COUMADIN) 7.5 MG tablet Take 1 tablet (7.5 mg total) by mouth daily. Except 1/2 tablet on Tue/Thur 32 tablet  6   No current facility-administered medications for this visit.     REVIEW OF SYSTEMS:   Constitutional: Denies fevers, chills or abnormal night sweats Eyes: Denies blurriness of vision, double vision or watery eyes Ears, nose, mouth, throat, and face: Denies mucositis or sore throat Respiratory: Denies cough, dyspnea or wheezes Cardiovascular: Denies palpitation, chest discomfort or lower extremity swelling Gastrointestinal:  Denies nausea, heartburn or change in bowel habits Skin: Denies abnormal skin rashes Lymphatics: Denies new lymphadenopathy or easy bruising Neurological:Denies numbness, tingling or new weaknesses Behavioral/Psych: Mood is stable, no new changes  Breast: Bilateral mastectomies All other systems were reviewed with the patient and are negative.  PHYSICAL EXAMINATION: ECOG PERFORMANCE STATUS: 1 - Symptomatic but completely ambulatory  Vitals:   12/26/18 1315  BP: 122/79  Pulse: 62  Resp: 18  Temp: 97.8 F (36.6 C)  SpO2: 98%   Filed Weights   12/26/18 1315  Weight: 209 lb 1.6 oz (94.8 kg)    GENERAL:alert, no distress and comfortable SKIN: skin color, texture, turgor are normal, no rashes or significant lesions EYES: normal, conjunctiva are pink and non-injected, sclera clear OROPHARYNX:no exudate, no erythema and lips, buccal mucosa, and tongue normal  NECK: supple, thyroid normal size, non-tender, without nodularity LYMPH:  no palpable lymphadenopathy in the cervical, axillary or inguinal LUNGS: clear to auscultation and percussion with normal breathing effort HEART: regular rate & rhythm and no murmurs and no lower extremity edema ABDOMEN:abdomen soft,  non-tender and normal bowel sounds Musculoskeletal:no cyanosis of digits and no clubbing  PSYCH: alert & oriented x 3 with fluent speech NEURO: no focal motor/sensory deficits  LABORATORY DATA:  I have reviewed the data as listed Lab Results  Component Value Date   WBC 8.5 05/25/2017   HGB  13.3 05/25/2017   HCT 38.4 05/25/2017   MCV 88 05/25/2017   PLT 307 05/25/2017   Lab Results  Component Value Date   NA 142 05/25/2017   K 4.4 05/25/2017   CL 103 05/25/2017   CO2 24 05/25/2017    RADIOGRAPHIC STUDIES: I have personally reviewed the radiological reports and agreed with the findings in the report.  ASSESSMENT AND PLAN:  Malignant neoplasm of upper-outer quadrant of right breast in female, estrogen receptor positive ([place]) T2N0, ER PR positive, HER-2 negative, 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  Status post bilateral mastectomies at [facility], radiation, and chemotherapy CEF x5 cycles followed by radiation to the right chest  Current treatment: Anastrozole started 2009 Anastrozole toxicities: Denies any hot flashes or myalgias.  Breast cancer surveillance: 1.  Examination: Bilateral mastectomies 2.  No role of imaging studies since she had bilateral mastectomies  Patient wishes to stay on anastrozole indefinitely.  I recommended bone density evaluation to make sure she does not have osteoporosis.  She understands fully well that there is no data for more than 10 years of antiestrogen therapy.  Return to clinic in 1 year for follow-up  DVT of upper extremity (deep vein thrombosis) (HCC) Recurrent DVTs  lab testing was done and she was found to be a heterozygotefor the factor V Leiden gene mutation.She was anticoagulated and she has been kept on therapeutic Coumadin.  She does her INR checks at home.  She talked to a pharmacist who is been managing her INRs.  All questions were answered. The patient knows to call the clinic with any problems, questions or concerns.   Nicholas Lose, MD 12/26/2018   I, Cloyde Reams Dorshimer, am acting as scribe for Nicholas Lose, MD.  I have reviewed the above documentation for accuracy and completeness, and I agree with the above.

## 2018-12-26 ENCOUNTER — Inpatient Hospital Stay: Payer: PPO | Attending: Hematology and Oncology | Admitting: Hematology and Oncology

## 2018-12-26 ENCOUNTER — Telehealth: Payer: Self-pay | Admitting: Hematology and Oncology

## 2018-12-26 ENCOUNTER — Other Ambulatory Visit: Payer: Self-pay

## 2018-12-26 DIAGNOSIS — Z79811 Long term (current) use of aromatase inhibitors: Secondary | ICD-10-CM

## 2018-12-26 DIAGNOSIS — Z86718 Personal history of other venous thrombosis and embolism: Secondary | ICD-10-CM

## 2018-12-26 DIAGNOSIS — D6851 Activated protein C resistance: Secondary | ICD-10-CM

## 2018-12-26 DIAGNOSIS — Z170 Estrogen receptor positive status [ER+]: Secondary | ICD-10-CM | POA: Diagnosis not present

## 2018-12-26 DIAGNOSIS — C50411 Malignant neoplasm of upper-outer quadrant of right female breast: Secondary | ICD-10-CM

## 2018-12-26 DIAGNOSIS — I82629 Acute embolism and thrombosis of deep veins of unspecified upper extremity: Secondary | ICD-10-CM

## 2018-12-26 DIAGNOSIS — Z7901 Long term (current) use of anticoagulants: Secondary | ICD-10-CM

## 2018-12-26 IMAGING — US US ABDOMEN COMPLETE W/ ELASTOGRAPHY
2 series · 13 of 25 positions shown · non-contrast
Comparison: None.

CLINICAL DATA: Fatty liver



[Series 1: us abdomen complete w/ elastography · 0.19mm/px · 12 of 170 slices shown (1 of 2)]
[im 1/170]
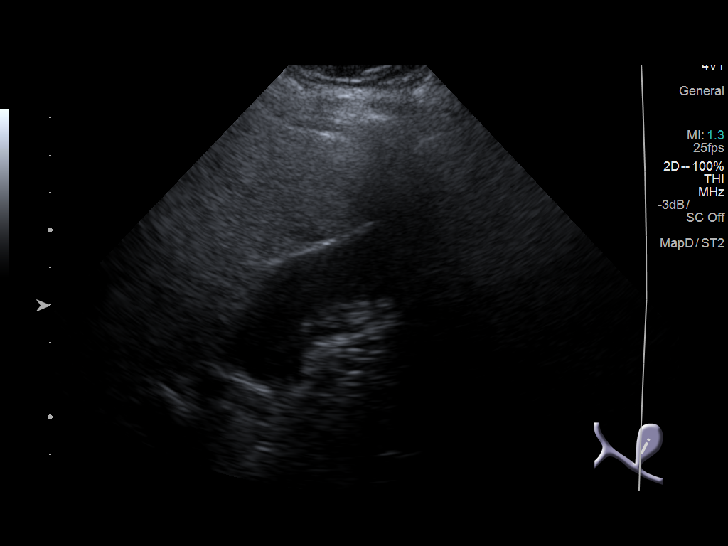
[im 16/170]
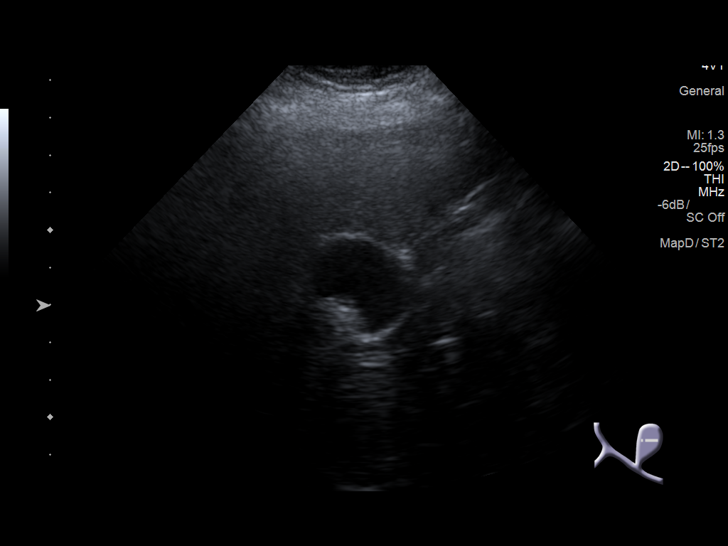
[im 31/170]
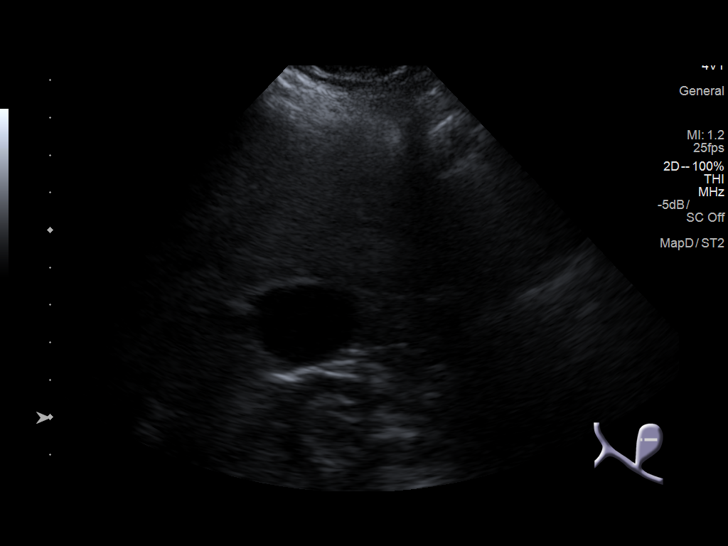
[im 47/170]
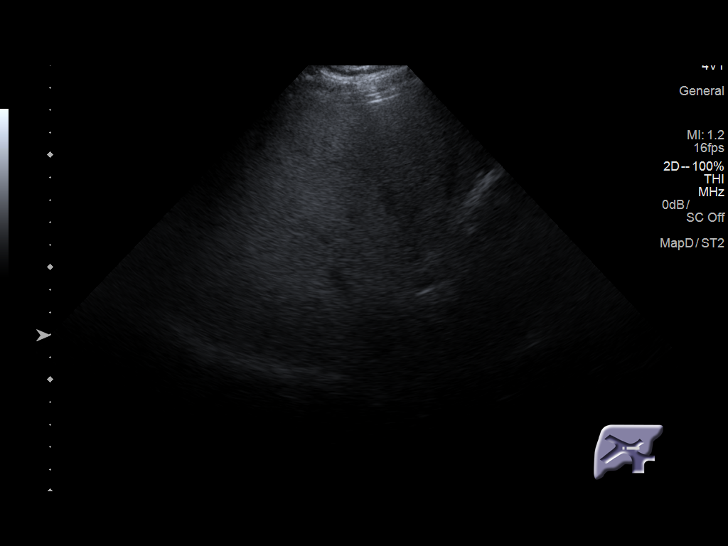
[im 62/170]
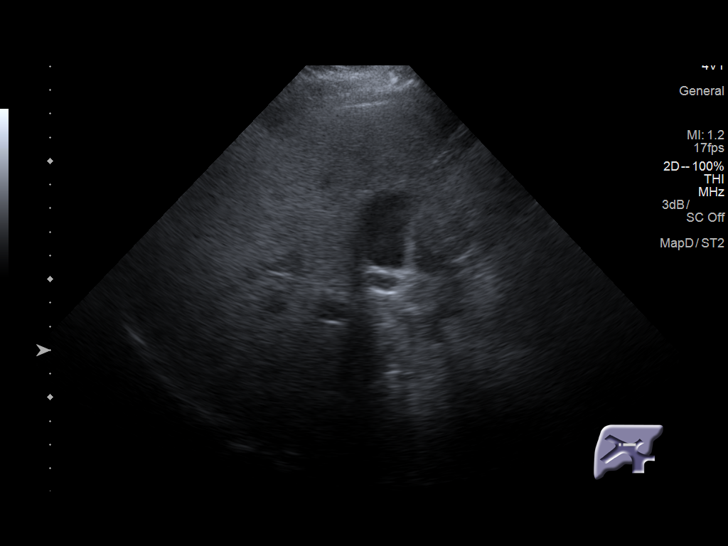
[im 77/170]
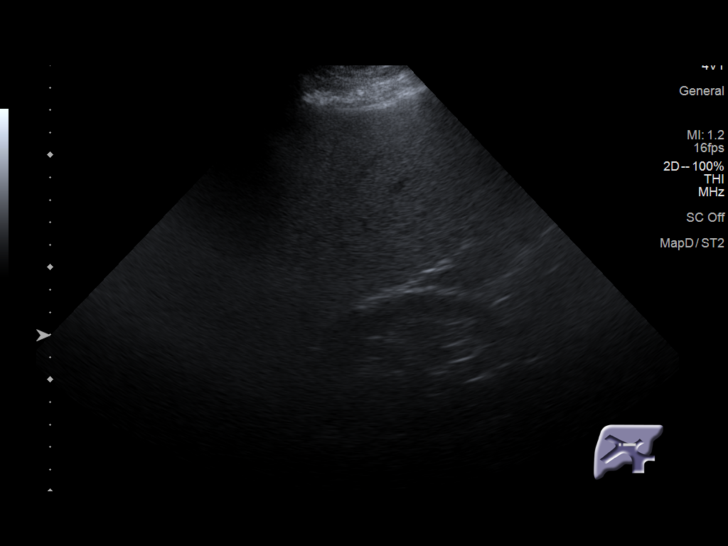
[im 93/170]
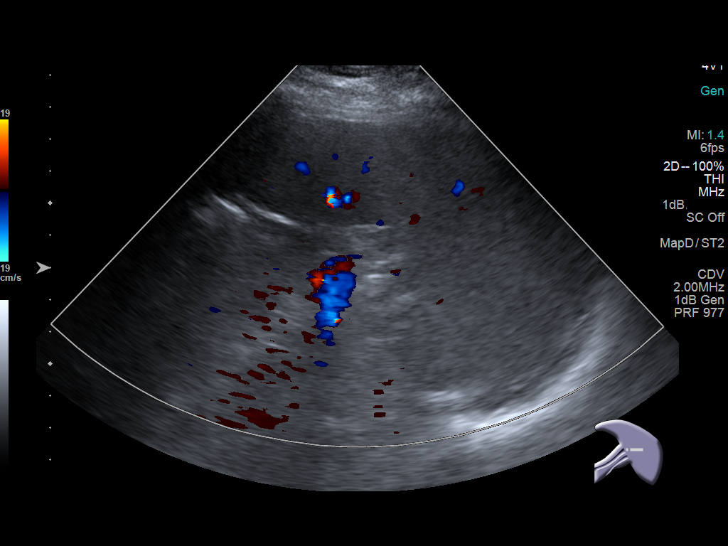
[im 108/170]
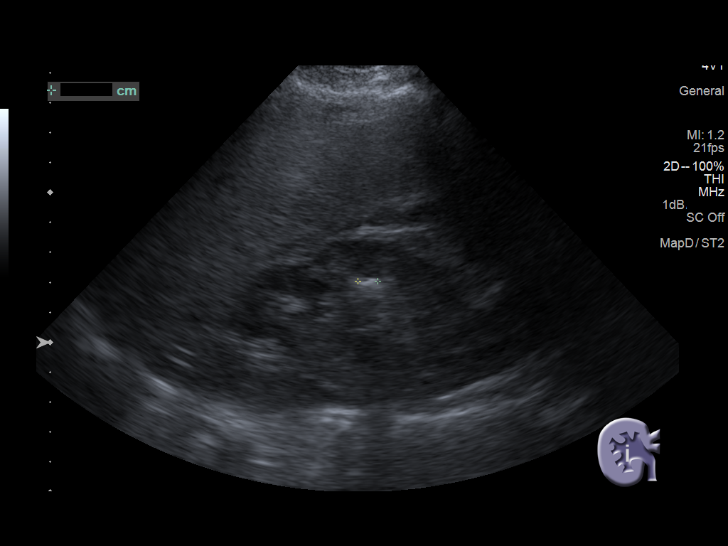
[im 123/170]
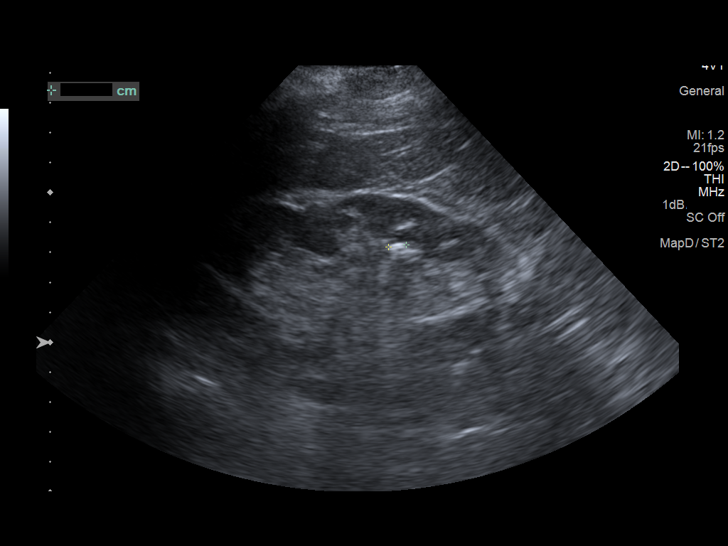
[im 139/170]
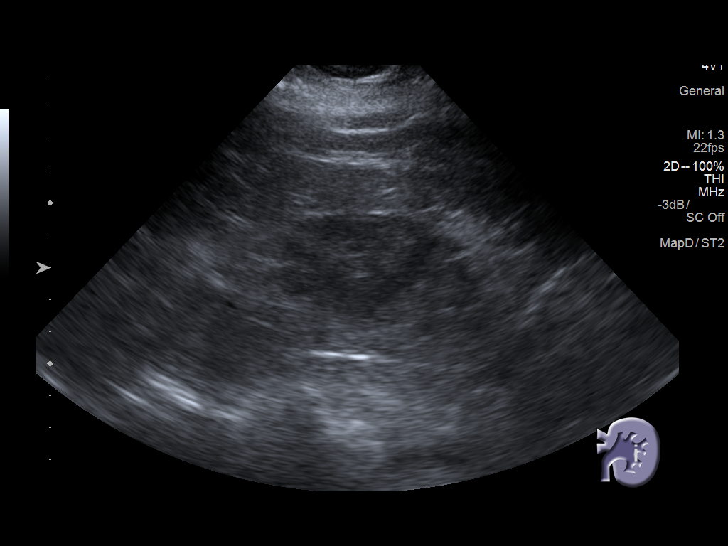
[im 154/170]
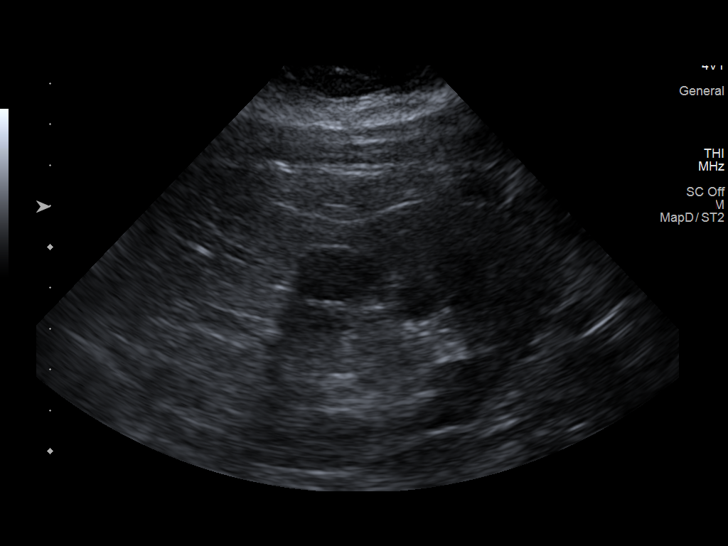
[im 170/170]
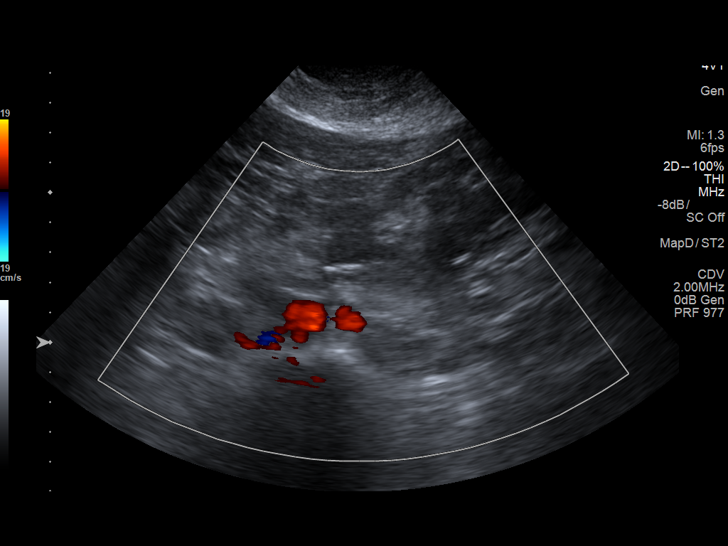

[Series 1: us abdomen complete w/ elastography · 0.13mm/px · 1 of 18 slices shown (2 of 2)]
[im 18/18]
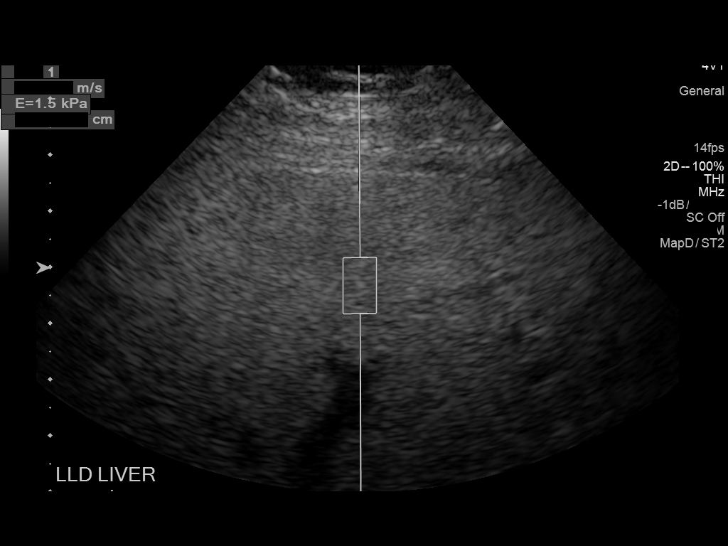

[13 of 25 positions shown; findings below may reference images not displayed]

FINDINGS: ULTRASOUND ABDOMEN

Gallbladder: Multiple mobile layering gallstones. No wall thickening
or sonographic Murphy's sign.

Common bile duct: Diameter: Normal caliber, 2 mm

Liver: Increased echotexture compatible with fatty infiltration. No
focal abnormality or biliary ductal dilatation. Portal vein is
patent on color Doppler imaging with normal direction of blood flow
towards the liver.

IVC: No abnormality visualized.

Pancreas: Visualized portion unremarkable.

Spleen: Size and appearance within normal limits.

Right Kidney: Length: 8.9 cm. Small echogenic shadowing areas likely
reflects small nonobstructing stones.. Echogenicity within normal
limits. No mass or hydronephrosis visualized. Mild cortical
thinning.

Left Kidney: Length: 10.0 cm. Mild cortical thinning. Normal
echotexture. Small cysts in the lower pole measures 1.8 cm.

Abdominal aorta: No aneurysm visualized.

Other findings: None.

ULTRASOUND HEPATIC ELASTOGRAPHY

Device: Siemens Helix VTQ

Patient position: Left lateral decubitus

Transducer 4V1

Number of measurements: 12

Hepatic segment:  8

Median velocity:   0.72 m/sec

IQR:

IQR/Median velocity ratio:

Corresponding Metavir fibrosis score:  F0/F1

Risk of fibrosis: Minimal

Limitations of exam: None

Please note that abnormal shear wave velocities may also be
identified in clinical settings other than with hepatic fibrosis,
such as: acute hepatitis, elevated right heart and central venous
pressures including use of beta blockers, Stoltz disease
([place]), infiltrative processes such as
mastocytosis/amyloidosis/infiltrative tumor, extrahepatic
cholestasis, in the post-prandial state, and liver transplantation.
Correlation with patient history, laboratory data, and clinical
condition recommended.
IMPRESSION: ULTRASOUND ABDOMEN:
Fatty infiltration of the liver.

Cholelithiasis.

Probable nonobstructing right renal stones. Mild cortical thinning
bilaterally.

ULTRASOUND HEPATIC ELASTOGRAPHY:

Median hepatic shear wave velocity is calculated at 0.72 m/sec.

Corresponding Metavir fibrosis score is F0/F1.

Risk of fibrosis is Minimal.

Follow-up: None required

## 2018-12-26 MED ORDER — ANASTROZOLE 1 MG PO TABS: 1.0000 mg | ORAL_TABLET | Freq: Every day | ORAL | 3 refills | Status: DC

## 2018-12-26 NOTE — Assessment & Plan Note (Signed)
Perioperative left upper extremity DVT (as noted surgery on the right,) lab testing was done and she was found to be a heterozygotefor the factor V Leiden gene mutation.She was anticoagulated and she has been kept on therapeutic Coumadin.

## 2018-12-26 NOTE — Assessment & Plan Note (Addendum)
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  Status post initial right mastectomy at [facility], radiation, and chemotherapy CEF x5 cycles with subsequent elective left mastectomy  Current treatment: Anastrozole started 2009 Anastrozole toxicities:  Breast cancer surveillance: 1.  Examination: Bilateral mastectomies 2.  No role of imaging studies since she had bilateral mastectomies  Patient wishes to stay on anastrozole indefinitely.  I recommended bone density evaluation to make sure she does not have osteoporosis.  She understands fully well that there is no data for more than 10 years of antiestrogen therapy.  Return to clinic in 1 year for follow-up

## 2018-12-26 NOTE — Telephone Encounter (Signed)
Gave avs and calendar ° °

## 2018-12-30 NOTE — Addendum Note (Signed)
Addended by: Forde Dandy on: 12/30/2018 11:27 AM   Modules accepted: Orders

## 2019-01-07 DIAGNOSIS — Z0001 Encounter for general adult medical examination with abnormal findings: Secondary | ICD-10-CM | POA: Diagnosis not present

## 2019-01-22 ENCOUNTER — Telehealth (INDEPENDENT_AMBULATORY_CARE_PROVIDER_SITE_OTHER): Payer: PPO | Admitting: Pharmacist

## 2019-01-22 DIAGNOSIS — Z5181 Encounter for therapeutic drug level monitoring: Secondary | ICD-10-CM | POA: Diagnosis not present

## 2019-01-22 DIAGNOSIS — D6851 Activated protein C resistance: Secondary | ICD-10-CM

## 2019-01-22 DIAGNOSIS — Z7901 Long term (current) use of anticoagulants: Secondary | ICD-10-CM | POA: Diagnosis not present

## 2019-01-22 DIAGNOSIS — Z86718 Personal history of other venous thrombosis and embolism: Secondary | ICD-10-CM | POA: Diagnosis not present

## 2019-01-22 DIAGNOSIS — I82629 Acute embolism and thrombosis of deep veins of unspecified upper extremity: Secondary | ICD-10-CM

## 2019-01-22 DIAGNOSIS — D689 Coagulation defect, unspecified: Secondary | ICD-10-CM

## 2019-01-22 LAB — POCT INR: INR: 2.3 (ref 2.0–3.0)

## 2019-02-07 NOTE — Progress Notes (Signed)
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 Patient has a home INR meter  Indication: DVThistory, Factor V Leiden mutation Duration: indefinite Supervising physician: Nicholas Lose  Anticoagulation Clinic Visit History: Patientdoes notreport signs/symptoms of bleeding or thromboembolism or other changes.  Anticoagulation Episode Summary    Current INR goal:   2.0-3.0  TTR:   72.4 % (6.8 y)  Next INR check:   12/18/2018  INR from last check:   1.9! (12/04/2018)  Most recent INR:    2.3 (01/22/2019)  Weekly max warfarin dose:     Target end date:   04/11/2015  INR check location:     Preferred lab:     Send INR reminders to:   [place]   Indications   DVT of upper extremity (deep vein thrombosis) ([place]) [I82.629] Factor V Leiden mutation ([place]) [D68.51] Coagulopathy ([place]) [D68.9] Chronic anticoagulation [Z79.01]       Comments:         Anticoagulation Care Providers    Provider Role Specialty Phone number   Annia Belt, MD Referring Oncology [phone]      Allergies  Allergen Reactions  . Amoxicillin-Pot Clavulanate Rash  . Ceftin Rash  . Cefuroxime Rash  . Cefuroxime Axetil Rash  . Risperidone And Related Other (See Comments)    Has medical contraindications   Medication Sig  acetaminophen (TYLENOL) 500 MG tablet Take 1,000 mg by mouth as needed.  alprazolam (XANAX) 2 MG tablet Take 2 mg by mouth 2 (two) times daily as needed (May take a 1 mg during the day but always takes 2 mg at bedtime.). For anxiety  anastrozole (ARIMIDEX) 1 MG tablet Take 1 tablet (1 mg total) by mouth daily.  Calcium Carbonate-Vit D-Min (CALCIUM 600+D3 PLUS MINERALS) 600-800 MG-UNIT TABS Take by mouth.  cyanocobalamin (,VITAMIN B-12,) 1000 MCG/ML injection Inject 100 mcg into the muscle every 30 (thirty) days.   docusate sodium (STOOL SOFTENER) 100 MG capsule Take 2 capsules (200 mg total) by  mouth at bedtime.  enoxaparin (LOVENOX) 150 MG/ML injection Inject 1 mL (150 mg total) into the skin daily.  esomeprazole (NEXIUM) 40 MG capsule Take 40 mg by mouth 2 (two) times daily before a meal.  fluticasone (FLONASE) 50 MCG/ACT nasal spray Place 2 sprays into the nose daily.   ibuprofen (ADVIL,MOTRIN) 800 MG tablet Take by mouth.  levocetirizine (XYZAL) 5 MG tablet TAKE ONE TABLET BY MOUTH AT BEDTIME.  lidocaine (LIDODERM) 5 % Place 1 patch onto the skin as needed. Remove & Discard patch within 12 hours or as directed by MD (only uses prn)  metoprolol (TOPROL-XL) 100 MG 24 hr tablet Take 100 mg by mouth daily.    omega-3 fish oil (MAXEPA) 1000 MG CAPS capsule Take 1 capsule (1,000 mg total) by mouth 2 (two) times daily.  promethazine (PHENERGAN) 25 MG tablet Take 25 mg by mouth as needed for nausea.   simvastatin (ZOCOR) 20 MG tablet Take 20 mg by mouth daily.   triazolam (HALCION) 0.25 MG tablet Take 1 tab by mouth 1 hour prior to procedure with very light food, do not drive motor vehicle.  warfarin (COUMADIN) 7.5 MG tablet Take 1 tablet (7.5 mg total) by mouth daily. Except 1/2 tablet on Tue/Thur   Past Medical History:  Diagnosis Date  . Abnormal Pap smear   . Acid reflux   . Bipolar disorder ([place]) 03/22/2013  . Breast cancer ([place])   . Breast disorder    cancer  .  Chronic anticoagulation 03/22/2013  . Coagulopathy ([place]) 04/10/2012  . Constipation   . DVT of upper extremity (deep vein thrombosis) ([place]) 04/10/2012  . Factor V Leiden ([place])   . Factor V Leiden mutation ([place]) 04/10/2012  . Family history of ovarian cancer   . Fatty liver 06/04/2017  . Fibroid   . Gallstones   . Heart murmur   . High cholesterol   . History of abnormal cervical Pap smear 01/06/2015  . History of breast cancer 01/06/2015  . HTN (hypertension)   . HX: breast cancer   . Hypertension 12/31/2012  . Kidney stones   . Kidney stones 06/04/2017  . Lobular carcinoma of breast, estrogen receptor positive, stage 2  04/10/2012  . Mental disorder    panic attacks  . Osteoarthritis   . Pain in both knees   . Vaginal Pap smear, abnormal    Social History   Socioeconomic History  . Marital status: Divorced    Spouse name: Not on file  . Number of children: Not on file  . Years of education: Not on file  . Highest education level: Not on file  Occupational History  . Not on file  Social Needs  . Financial resource strain: Not on file  . Food insecurity:    Worry: Not on file    Inability: Not on file  . Transportation needs:    Medical: Not on file    Non-medical: Not on file  Tobacco Use  . Smoking status: Never Smoker  . Smokeless tobacco: Never Used  Substance and Sexual Activity  . Alcohol use: Yes    Alcohol/week: 0.0 standard drinks    Comment: Rarely.  . Drug use: No  . Sexual activity: Never    Birth control/protection: Surgical    Comment: partial hyst  Lifestyle  . Physical activity:    Days per week: Not on file    Minutes per session: Not on file  . Stress: Not on file  Relationships  . Social connections:    Talks on phone: Not on file    Gets together: Not on file    Attends religious service: Not on file    Active member of club or organization: Not on file    Attends meetings of clubs or organizations: Not on file    Relationship status: Not on file  Other Topics Concern  . Not on file  Social History Narrative  . Not on file   Family History  Problem Relation Age of Onset  . Alcohol abuse Mother   . COPD Mother   . Alcohol abuse Father   . Heart disease Father   . Pneumonia Father   . Diabetes Father   . Colon cancer Father        Dx 70s; deceased 43s  . OCD Daughter   . Bipolar disorder Daughter   . Hypertension Daughter   . Schizophrenia Daughter   . Schizophrenia Daughter   . Bipolar disorder Daughter   . OCD Daughter   . Hypertension Daughter   . COPD Daughter   . Other Daughter        stomach don't empty out  . ADD / ADHD Grandchild   .  Anxiety disorder Grandchild   . Depression Grandchild   . Healthy Sister   . Cancer Brother        leukemia 55s; deceased 41  . Heart disease Brother   . Cancer Brother        liver; currently  58  . Cirrhosis Brother   . Other Brother        waiting for liver transplant  . Alzheimer's disease Paternal Grandmother   . Ovarian cancer Maternal Grandmother 61       deceased 61  . Cancer Maternal Uncle        3 of 6 with various cancers: skin, NHL, Hodkin's Dx  . Breast cancer Cousin 56       mat first cousin related through unaffected aunt   ASSESSMENT Recent Results: Lab Results  Component Value Date   INR 2.3 01/22/2019   INR 1.9 (A) 12/04/2018   INR 1.9 (A) 11/11/2018   PROTIME 27.6 (H) 07/30/2013   INR today: Therapeutic  PLAN Weekly dose was unchanged. Take 1 tablet (7.5 mg total) by mouth daily. Except 1/2 tablet on Tue/Thur  There are no Patient Instructions on file for this visit. Patient advised to contact clinic or seek medical attention if signs/symptoms of bleeding or thromboembolism occur.  Patient verbalized understanding by repeating back information and was advised to contact me if further medication-related questions arise. Patient was also provided an information handout.  Follow-up 4 weeks  [name]

## 2019-02-10 ENCOUNTER — Telehealth (INDEPENDENT_AMBULATORY_CARE_PROVIDER_SITE_OTHER): Payer: PPO | Admitting: Pharmacist

## 2019-02-10 DIAGNOSIS — Z7901 Long term (current) use of anticoagulants: Secondary | ICD-10-CM

## 2019-02-10 DIAGNOSIS — D6851 Activated protein C resistance: Secondary | ICD-10-CM | POA: Diagnosis not present

## 2019-02-10 DIAGNOSIS — Z86718 Personal history of other venous thrombosis and embolism: Secondary | ICD-10-CM

## 2019-02-10 DIAGNOSIS — Z5181 Encounter for therapeutic drug level monitoring: Secondary | ICD-10-CM

## 2019-02-10 DIAGNOSIS — I82629 Acute embolism and thrombosis of deep veins of unspecified upper extremity: Secondary | ICD-10-CM

## 2019-02-10 DIAGNOSIS — D689 Coagulation defect, unspecified: Secondary | ICD-10-CM

## 2019-02-10 LAB — POCT INR: INR: 2.2 (ref 2.0–3.0)

## 2019-02-10 NOTE — Progress Notes (Addendum)
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 Patient has a home INR meter. Identity was verified using date of birth and address.  Indication: DVThistory, Factor V Leiden mutation Duration: indefinite Supervising physician: Nicholas Lose  Anticoagulation Clinic Visit History: Patientdoes notreport signs/symptoms of bleeding or thromboembolism or other changes.  Anticoagulation Episode Summary    Current INR goal:   2.0-3.0  TTR:   72.6 % (6.8 y)  Next INR check:   03/10/2019  INR from last check:   2.2 (02/10/2019)  Weekly max warfarin dose:     Target end date:   04/11/2015  INR check location:     Preferred lab:     Send INR reminders to:   [facility]   Indications   DVT of upper extremity (deep vein thrombosis) (HCC) [I82.629] Factor V Leiden mutation ([place]) [D68.51] Coagulopathy ([place]) [D68.9] Chronic anticoagulation [Z79.01]       Comments:         Anticoagulation Care Providers    Provider Role Specialty Phone number   Nicholas Lose, MD Responsible Hematology and Oncology [phone]      Allergies  Allergen Reactions  . Amoxicillin-Pot Clavulanate Rash  . Ceftin Rash  . Cefuroxime Rash  . Cefuroxime Axetil Rash  . Risperidone And Related Other (See Comments)    Has medical contraindications   Medication Sig  acetaminophen (TYLENOL) 500 MG tablet Take 1,000 mg by mouth as needed.  alprazolam (XANAX) 2 MG tablet Take 2 mg by mouth 2 (two) times daily as needed (May take a 1 mg during the day but always takes 2 mg at bedtime.). For anxiety  anastrozole (ARIMIDEX) 1 MG tablet Take 1 tablet (1 mg total) by mouth daily.  Calcium Carbonate-Vit D-Min (CALCIUM 600+D3 PLUS MINERALS) 600-800 MG-UNIT TABS Take by mouth.  cyanocobalamin (,VITAMIN B-12,) 1000 MCG/ML injection Inject 100 mcg into the muscle every 30 (thirty) days.   docusate sodium (STOOL SOFTENER) 100 MG capsule Take 2  capsules (200 mg total) by mouth at bedtime.  enoxaparin (LOVENOX) 150 MG/ML injection Inject 1 mL (150 mg total) into the skin daily.  esomeprazole (NEXIUM) 40 MG capsule Take 40 mg by mouth 2 (two) times daily before a meal.  fluticasone (FLONASE) 50 MCG/ACT nasal spray Place 2 sprays into the nose daily.   ibuprofen (ADVIL,MOTRIN) 800 MG tablet Take by mouth.  levocetirizine (XYZAL) 5 MG tablet TAKE ONE TABLET BY MOUTH AT BEDTIME.  lidocaine (LIDODERM) 5 % Place 1 patch onto the skin as needed. Remove & Discard patch within 12 hours or as directed by MD (only uses prn)  metoprolol (TOPROL-XL) 100 MG 24 hr tablet Take 100 mg by mouth daily.    omega-3 fish oil (MAXEPA) 1000 MG CAPS capsule Take 1 capsule (1,000 mg total) by mouth 2 (two) times daily.  promethazine (PHENERGAN) 25 MG tablet Take 25 mg by mouth as needed for nausea.   simvastatin (ZOCOR) 20 MG tablet Take 20 mg by mouth daily.   triazolam (HALCION) 0.25 MG tablet Take 1 tab by mouth 1 hour prior to procedure with very light food, do not drive motor vehicle.  warfarin (COUMADIN) 7.5 MG tablet Take 1 tablet (7.5 mg total) by mouth daily. Except 1/2 tablet on Tue/Thur   Past Medical History:  Diagnosis Date  . Abnormal Pap smear   . Acid reflux   . Bipolar disorder ([place]) 03/22/2013  . Breast cancer ([place])   . Breast disorder    cancer  .  Chronic anticoagulation 03/22/2013  . Coagulopathy ([place]) 04/10/2012  . Constipation   . DVT of upper extremity (deep vein thrombosis) ([place]) 04/10/2012  . Factor V Leiden ([place])   . Factor V Leiden mutation ([place]) 04/10/2012  . Family history of ovarian cancer   . Fatty liver 06/04/2017  . Fibroid   . Gallstones   . Heart murmur   . High cholesterol   . History of abnormal cervical Pap smear 01/06/2015  . History of breast cancer 01/06/2015  . HTN (hypertension)   . HX: breast cancer   . Hypertension 12/31/2012  . Kidney stones   . Kidney stones 06/04/2017  . Lobular carcinoma of breast, estrogen  receptor positive, 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  . Mental disorder    panic attacks  . Osteoarthritis   . Pain in both knees   . Vaginal Pap smear, abnormal    Social History   Socioeconomic History  . Marital status: Divorced    Spouse name: Not on file  . Number of children: Not on file  . Years of education: Not on file  . Highest education level: Not on file  Occupational History  . Not on file  Social Needs  . Financial resource strain: Not on file  . Food insecurity:    Worry: Not on file    Inability: Not on file  . Transportation needs:    Medical: Not on file    Non-medical: Not on file  Tobacco Use  . Smoking status: Never Smoker  . Smokeless tobacco: Never Used  Substance and Sexual Activity  . Alcohol use: Yes    Alcohol/week: 0.0 standard drinks    Comment: Rarely.  . Drug use: No  . Sexual activity: Never    Birth control/protection: Surgical    Comment: partial hyst  Lifestyle  . Physical activity:    Days per week: Not on file    Minutes per session: Not on file  . Stress: Not on file  Relationships  . Social connections:    Talks on phone: Not on file    Gets together: Not on file    Attends religious service: Not on file    Active member of club or organization: Not on file    Attends meetings of clubs or organizations: Not on file    Relationship status: Not on file  Other Topics Concern  . Not on file  Social History Narrative  . Not on file   Family History  Problem Relation Age of Onset  . Alcohol abuse Mother   . COPD Mother   . Alcohol abuse Father   . Heart disease Father   . Pneumonia Father   . Diabetes Father   . Colon cancer Father        Dx 50s; deceased 70s  . OCD Daughter   . Bipolar disorder Daughter   . Hypertension Daughter   . Schizophrenia Daughter   . Schizophrenia Daughter   . Bipolar disorder Daughter   . OCD Daughter   . Hypertension Daughter   . COPD Daughter   . Other Daughter        stomach don't empty out  .  ADD / ADHD Grandchild   . Anxiety disorder Grandchild   . Depression Grandchild   . Healthy Sister   . Cancer Brother        leukemia 66s; deceased 79  . Heart disease Brother   . Cancer Brother        liver; currently  71  . Cirrhosis Brother   . Other Brother        waiting for liver transplant  . Alzheimer's disease Paternal Grandmother   . Ovarian cancer Maternal Grandmother 25       deceased 73  . Cancer Maternal Uncle        3 of 6 with various cancers: skin, NHL, Hodkin's Dx  . Breast cancer Cousin 26       mat first cousin related through unaffected aunt   ASSESSMENT Recent Results: Lab Results  Component Value Date   INR 2.2 02/10/2019   INR 2.3 01/22/2019   INR 1.9 (A) 12/04/2018   PROTIME 27.6 (H) 07/30/2013   Anticoagulation Dosing: Description   DR [id number] PATIENT     INR today: Therapeutic  PLAN Weekly dose was unchanged (7.5 mg daily, except 3.75 mg on Tue/Thu)  Patient correctly reports her dosing and expressed no concerns today. Patient advised to contact clinic or seek medical attention if signs/symptoms of bleeding or thromboembolism occur.  Patient verbalized understanding by repeating back information and was advised to contact me if further medication-related questions arise. Patient was also provided an information handout.  Follow-up 2-4 weeks  [name]

## 2019-02-10 NOTE — Patient Instructions (Signed)
Patient educated about medication as defined in this encounter and verbalized understanding by repeating back instructions provided.   

## 2019-02-11 ENCOUNTER — Other Ambulatory Visit: Payer: Self-pay | Admitting: *Deleted

## 2019-02-11 ENCOUNTER — Telehealth: Payer: Self-pay | Admitting: Physical Medicine and Rehabilitation

## 2019-02-11 DIAGNOSIS — I82629 Acute embolism and thrombosis of deep veins of unspecified upper extremity: Secondary | ICD-10-CM

## 2019-02-11 DIAGNOSIS — D6851 Activated protein C resistance: Secondary | ICD-10-CM

## 2019-02-11 DIAGNOSIS — Z7901 Long term (current) use of anticoagulants: Secondary | ICD-10-CM

## 2019-02-11 DIAGNOSIS — D689 Coagulation defect, unspecified: Secondary | ICD-10-CM

## 2019-02-11 LAB — PROTIME-INR

## 2019-02-11 NOTE — Telephone Encounter (Signed)
Ok if helped, she neds to be off her coumadin if possible

## 2019-02-12 ENCOUNTER — Telehealth: Payer: Self-pay | Admitting: Physical Medicine and Rehabilitation

## 2019-02-12 NOTE — Telephone Encounter (Signed)
Patient is scheduled for 6/2. She states that Dr. Lindi Adie now manages her Coumadin. I sent a message asking for the ok to hold this for 5 days prior to this injection. I advised patient that this injection is pending the ok to hold Coumadin. She states that she was told last time that she did not need to hold this. There is a telephone message from Dr. [name] that it was ok to hold coumadin.

## 2019-02-13 NOTE — Telephone Encounter (Signed)
Okay to hold Coumadin for 5 days

## 2019-02-14 NOTE — Telephone Encounter (Signed)
Patient advised.

## 2019-02-21 ENCOUNTER — Telehealth: Payer: Self-pay | Admitting: Pharmacy Technician

## 2019-02-21 DIAGNOSIS — Z7901 Long term (current) use of anticoagulants: Secondary | ICD-10-CM

## 2019-02-21 DIAGNOSIS — D6851 Activated protein C resistance: Secondary | ICD-10-CM

## 2019-02-21 DIAGNOSIS — D689 Coagulation defect, unspecified: Secondary | ICD-10-CM

## 2019-02-21 DIAGNOSIS — I82629 Acute embolism and thrombosis of deep veins of unspecified upper extremity: Secondary | ICD-10-CM

## 2019-02-25 ENCOUNTER — Encounter (INDEPENDENT_AMBULATORY_CARE_PROVIDER_SITE_OTHER): Payer: Self-pay | Admitting: Internal Medicine

## 2019-02-25 ENCOUNTER — Ambulatory Visit (INDEPENDENT_AMBULATORY_CARE_PROVIDER_SITE_OTHER): Payer: PPO | Admitting: Internal Medicine

## 2019-02-25 ENCOUNTER — Other Ambulatory Visit: Payer: Self-pay

## 2019-02-25 ENCOUNTER — Ambulatory Visit: Payer: Self-pay | Admitting: Pharmacy Technician

## 2019-02-25 DIAGNOSIS — K219 Gastro-esophageal reflux disease without esophagitis: Secondary | ICD-10-CM | POA: Diagnosis not present

## 2019-02-25 DIAGNOSIS — Z860100 Personal history of colon polyps, unspecified: Secondary | ICD-10-CM

## 2019-02-25 DIAGNOSIS — Z7901 Long term (current) use of anticoagulants: Secondary | ICD-10-CM

## 2019-02-25 DIAGNOSIS — D6851 Activated protein C resistance: Secondary | ICD-10-CM

## 2019-02-25 DIAGNOSIS — D689 Coagulation defect, unspecified: Secondary | ICD-10-CM

## 2019-02-25 DIAGNOSIS — I82629 Acute embolism and thrombosis of deep veins of unspecified upper extremity: Secondary | ICD-10-CM

## 2019-02-25 LAB — POCT INR: INR: 2.4 (ref 2.0–3.0)

## 2019-02-25 NOTE — Progress Notes (Signed)
Virtual Visit via Telephone Note  Patient had scheduled visit today.  Visit was canceled because of ongoing COVID-19 pandemic.  Patient requested to proceed with tele-visit and I agreed to proceed with it. I connected with Jessica Reilly on 02/25/19 at  9:45 AM EDT by telephone and verified that I am speaking with the correct person using two identifiers.  Location: Patient: home Provider: office   I discussed the limitations, risks, security and privacy concerns of performing an evaluation and management service by telephone and the availability of in person appointments. I also discussed with the patient that there may be a patient responsible charge related to this service. The patient expressed understanding and agreed to proceed.   History of Present Illness:  Patient is 58 year old Caucasian female who has a history of GERD fatty liver and colonic adenomas and was last seen in the office 15 months ago.  Patient states she is doing well.  She has tried cutting back on her Nexium to once a day but she has severe heartburn and vomiting by night.  She also has tried taking PPI in the morning and famotidine or Zantac in the evening but it has not worked.  She is not having any side effects with Nexium.  She denies throat symptoms.  She also denies dysphagia.  She says she is doing well as far as her bowels are concerned.  She drinks coffee and within few minutes she has bowel movement.  She denies melena or rectal bleeding.  She is having occasional discomfort in left lower quadrant of her abdomen. She is due for surveillance colonoscopy.  She would like to wait until this pandemic is over.   Observations/Objective: Patient's weight not available.  Assessment and Plan:  #1 GERD.  She is doing well with therapy however she is requiring 80 mg of esomeprazole daily.  So far attempts at reducing PPI dose have not worked.  She will continue present dose for now.  #2. history of colonic adenomas  and family history of colon carcinoma.  She will undergo surveillance colonoscopy later this year.  #3.  History of fatty liver.  No recent LFTs on file.  Follow Up Instructions:  Patient will continue Esomeprazole at 40 mg p.o. twice daily.  Surveillance colonoscopy to be scheduled in fall of this year. Office visit in 1 year. I discussed the assessment and treatment plan with the patient. The patient was provided an opportunity to ask questions and all were answered. The patient agreed with the plan and demonstrated an understanding of the instructions.   The patient was advised to call back or seek an in-person evaluation if the symptoms worsen or if the condition fails to improve as anticipated.  I provided  9 minutes of non-face-to-face time during this encounter.   Hildred Laser, MD

## 2019-02-25 NOTE — Telephone Encounter (Signed)
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 Patient has a home INR meter. Identity was verified using date of birth and address.  Indication: DVThistory, Factor V Leiden mutation Duration: indefinite Supervising physician: Nicholas Lose  Anticoagulation Clinic Visit: - Signs/symptoms of bleeding or thromboembolism reported: none - Diet: no changes - Medications: no changes, no missed warfarin doses - Other concerns: Patient states that she is to have a procedure (steroid injection) on 6/2 and has been advised to hold warfarin for 5 days before procedure (starting 5/29). She states that she has not been informed about the need to bridge with Lovenox for this upcoming procedure, but she has done so in the past and still has some unexpired Lovenox injections from that past encounter. The patient asks if she is to bridge for this procedure on 6/2.   Anticoagulation Episode Summary    Current INR goal:   2.0-3.0  TTR:   72.6 % (6.8 y)  Next INR check:   03/10/2019  INR from last check:   2.2 (02/10/2019)  Weekly max warfarin dose:     Target end date:   04/11/2015  INR check location:     Preferred lab:     Send INR reminders to:   [facility]   Indications   DVT of upper extremity (deep vein thrombosis) (HCC) [I82.629] Factor V Leiden mutation ([place]) [D68.51] Coagulopathy ([place]) [D68.9] Chronic anticoagulation [Z79.01]       Comments:         Anticoagulation Care Providers    Provider Role Specialty Phone number   Nicholas Lose, MD Responsible Hematology and Oncology [phone]      Allergies  Allergen Reactions  . Amoxicillin-Pot Clavulanate Rash  . Ceftin Rash  . Cefuroxime Rash  . Cefuroxime Axetil Rash  . Risperidone And Related Other (See Comments)    Has medical contraindications   Medication Sig  acetaminophen (TYLENOL) 500 MG tablet Take 1,000 mg by mouth as needed.  alprazolam  (XANAX) 2 MG tablet Take 2 mg by mouth 2 (two) times daily as needed (May take a 1 mg during the day but always takes 2 mg at bedtime.). For anxiety  anastrozole (ARIMIDEX) 1 MG tablet Take 1 tablet (1 mg total) by mouth daily.  Calcium Carbonate-Vit D-Min (CALCIUM 600+D3 PLUS MINERALS) 600-800 MG-UNIT TABS Take by mouth.  cyanocobalamin (,VITAMIN B-12,) 1000 MCG/ML injection Inject 100 mcg into the muscle every 30 (thirty) days.   docusate sodium (STOOL SOFTENER) 100 MG capsule Take 2 capsules (200 mg total) by mouth at bedtime.  enoxaparin (LOVENOX) 150 MG/ML injection Inject 1 mL (150 mg total) into the skin daily.  esomeprazole (NEXIUM) 40 MG capsule Take 40 mg by mouth 2 (two) times daily before a meal.  fluticasone (FLONASE) 50 MCG/ACT nasal spray Place 2 sprays into the nose daily.   ibuprofen (ADVIL,MOTRIN) 800 MG tablet Take by mouth.  levocetirizine (XYZAL) 5 MG tablet TAKE ONE TABLET BY MOUTH AT BEDTIME.  lidocaine (LIDODERM) 5 % Place 1 patch onto the skin as needed. Remove & Discard patch within 12 hours or as directed by MD (only uses prn)  metoprolol (TOPROL-XL) 100 MG 24 hr tablet Take 100 mg by mouth daily.    omega-3 fish oil (MAXEPA) 1000 MG CAPS capsule Take 1 capsule (1,000 mg total) by mouth 2 (two) times daily.  promethazine (PHENERGAN) 25 MG tablet Take 25 mg by mouth as needed for nausea.   simvastatin (ZOCOR) 20 MG tablet  Take 20 mg by mouth daily.   triazolam (HALCION) 0.25 MG tablet Take 1 tab by mouth 1 hour prior to procedure with very light food, do not drive motor vehicle.  warfarin (COUMADIN) 7.5 MG tablet Take 1 tablet (7.5 mg total) by mouth daily. Except 1/2 tablet on Tue/Thur   Past Medical History:  Diagnosis Date  . Abnormal Pap smear   . Acid reflux   . Bipolar disorder ([place]) 03/22/2013  . Breast cancer ([place])   . Breast disorder    cancer  . Chronic anticoagulation 03/22/2013  . Coagulopathy ([place]) 04/10/2012  . Constipation   . DVT of upper extremity  (deep vein thrombosis) ([place]) 04/10/2012  . Factor V Leiden ([place])   . Factor V Leiden mutation ([place]) 04/10/2012  . Family history of ovarian cancer   . Fatty liver 06/04/2017  . Fibroid   . Gallstones   . Heart murmur   . High cholesterol   . History of abnormal cervical Pap smear 01/06/2015  . History of breast cancer 01/06/2015  . HTN (hypertension)   . HX: breast cancer   . Hypertension 12/31/2012  . Kidney stones   . Kidney stones 06/04/2017  . Lobular carcinoma of breast, estrogen receptor positive, 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  . Mental disorder    panic attacks  . Osteoarthritis   . Pain in both knees   . Vaginal Pap smear, abnormal     ASSESSMENT Recent Results: Lab Results  Component Value Date   INR 2.4 02/25/2019   INR 2.2 02/10/2019   INR 2.3 01/22/2019   PROTIME 27.6 (H) 07/30/2013   Anticoagulation Dosing: Description   DR Geralyn Flash PATIENT     INR today: Therapeutic and stable for the past several INR checks  PLAN - Weekly dose was unchanged (7.5 mg daily, except 3.75 mg on Tue/Thu). Patient correctly reports her warfarin dosing regimen.  - Will need clarification on need for Lovenox bridge while patient holding warfarin for procedure, 6/2. Patient has Factor V Leiden and history of upper extremity DVTs but no lower extremity DVTs.  - Patient advised to contact clinic or seek medical attention if signs/symptoms of bleeding or thromboembolism occur. Patient verbalized understanding by repeating back information and was advised to contact me if further medication-related questions arise.  Follow-up - Will contact PCP for clarification on Lovenox bridging and relay information to the patient - If bridging required, will provide injection schedule and education to patient on injection technique. - Next INR check in 3 weeks (after procedure)   Brendolyn Patty, PharmD PGY1 Pharmacy Resident Phone [phone]  02/25/2019   9:52 AM

## 2019-02-26 NOTE — Patient Instructions (Signed)
Surveillance colonoscopy to be scheduled this fall.

## 2019-03-05 ENCOUNTER — Telehealth: Payer: Self-pay | Admitting: Pharmacy Technician

## 2019-03-05 NOTE — Telephone Encounter (Signed)
Anticoagulation Management Jessica Reilly is a 59 y.o. female who was contacted regarding warfarin treatment. Patient identity verified using date of birth and address.   I called the patient back to discuss bridging with Lovenox for upcoming procedure, but the patient was adamant that she did not want to bridge no matter what the provider recommendation.   Discussed patient's schedule for holding warfarin for 5 days pre-procedure. Will reschedule next INR check to 03/17/2019 as the patient's INR will likely remain subtherapeutic for several days when she restarts warfarin after holding. Patient agreeable and had no questions at this time.  Patient instructed to call clinic if she has any questions or develops any signs or symptoms of bleeding/thrombus. Patient expressed understanding.   Brendolyn Patty, PharmD PGY1 Pharmacy Resident Phone [phone]  03/05/2019   9:08 AM

## 2019-03-11 ENCOUNTER — Ambulatory Visit: Payer: Self-pay

## 2019-03-11 ENCOUNTER — Ambulatory Visit (INDEPENDENT_AMBULATORY_CARE_PROVIDER_SITE_OTHER): Payer: PPO | Admitting: Physical Medicine and Rehabilitation

## 2019-03-11 ENCOUNTER — Other Ambulatory Visit: Payer: Self-pay

## 2019-03-11 ENCOUNTER — Encounter: Payer: Self-pay | Admitting: Physical Medicine and Rehabilitation

## 2019-03-11 VITALS — BP 145/86 | HR 67

## 2019-03-11 DIAGNOSIS — M5416 Radiculopathy, lumbar region: Secondary | ICD-10-CM

## 2019-03-11 MED ORDER — METHOCARBAMOL 500 MG PO TABS: 500.0000 mg | ORAL_TABLET | Freq: Four times a day (QID) | ORAL | 1 refills | Status: DC | PRN

## 2019-03-11 MED ORDER — METHYLPREDNISOLONE ACETATE 80 MG/ML IJ SUSP
80.0000 mg | Freq: Once | INTRAMUSCULAR | Status: AC
  Administered 2019-03-11: 80 mg

## 2019-03-11 NOTE — Progress Notes (Signed)
 .  Numeric Pain Rating Scale and Functional Assessment Average Pain 10   In the last MONTH (on 0-10 scale) has pain interfered with the following?  1. General activity like being  able to carry out your everyday physical activities such as walking, climbing stairs, carrying groceries, or moving a chair?  Rating(9)   +Driver, +BT(coumadin, stopped 03/06/2019) , -Dye Allergies.

## 2019-03-11 NOTE — Progress Notes (Signed)
Jessica Reilly - 58 y.o. female MRN [id number]  Date of birth: Feb 17, 1961  Office Visit Note: Visit Date: 03/11/2019 PCP: Sharilyn Sites, MD Referred by: Sharilyn Sites, MD  Subjective: Chief Complaint  Patient presents with  . Lower Back - Pain  . Right Thigh - Pain   HPI:  Jessica Reilly is a 58 y.o. female who comes in today For repeat right L3-4 interlaminar epidural steroid injection.  Patient has scoliosis with facet arthritis more on the right at the level of L2-3 and L3-4 with canal and lateral recess narrowing.  Last injection helped quite a bit for a couple of months.  She still feels like there is a knot area that gives her trouble but overall it did help her quite a bit.  She does not really like taking pain medication but is asking for muscle relaxer and we did prescribe Robaxin.  Her pain is 10 out of 10 no new findings no focal weakness.  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  She has been off anticoagulation for 5 days.  Case is also complicated by bipolar disorder.  ROS Otherwise per HPI.  Assessment & Plan: Visit Diagnoses:  1. Lumbar radiculopathy     Plan: No additional findings.   Meds & Orders:  Meds ordered this encounter  Medications  . methylPREDNISolone acetate (DEPO-MEDROL) injection 80 mg  . methocarbamol (ROBAXIN) 500 MG tablet    Sig: Take 1 tablet (500 mg total) by mouth every 6 (six) hours as needed for muscle spasms.    Dispense:  90 tablet    Refill:  1    Orders Placed This Encounter  Procedures  . XR C-ARM NO REPORT  . Epidural Steroid injection    Follow-up: Return if symptoms worsen or fail to improve.   Procedures: No procedures performed  Lumbar Epidural Steroid Injection - Interlaminar Approach with Fluoroscopic Guidance  Patient: Jessica Reilly      Date of Birth: [birth year] MRN: [id number] PCP: Sharilyn Sites, MD      Visit Date: 03/11/2019   Universal Protocol:     Consent  Given By: the patient  Position: PRONE  Additional Comments: Vital signs were monitored before and after the procedure. Patient was prepped and draped in the usual sterile fashion. The correct patient, procedure, and site was verified.   Injection Procedure Details:  Procedure Site One Meds Administered:  Meds ordered this encounter  Medications  . methylPREDNISolone acetate (DEPO-MEDROL) injection 80 mg  . methocarbamol (ROBAXIN) 500 MG tablet    Sig: Take 1 tablet (500 mg total) by mouth every 6 (six) hours as needed for muscle spasms.    Dispense:  90 tablet    Refill:  1     Laterality: Right  Location/Site:  L3-L4  Needle size: 20 G  Needle type: Tuohy  Needle Placement: Paramedian epidural  Findings:   -Comments: Excellent flow of contrast into the epidural space.  Initial flow of contrast seen to outline the right L3-4 facet joint and that of the needle was medially placed intraluminally.  With repositioning we did get flow contrast epidural.  Procedure Details: Using a paramedian approach from the side mentioned above, the region overlying the inferior lamina was localized under fluoroscopic visualization and the soft tissues overlying this structure were infiltrated with 4 ml. of 1% Lidocaine without Epinephrine. The Tuohy needle was inserted into the epidural space using a paramedian approach.   The  epidural space was localized using loss of resistance along with lateral and bi-planar fluoroscopic views.  After negative aspirate for air, blood, and CSF, a 2 ml. volume of Isovue-250 was injected into the epidural space and the flow of contrast was observed. Radiographs were obtained for documentation purposes.    The injectate was administered into the level noted above.   Additional Comments:  The patient tolerated the procedure well Dressing: 2 x 2 sterile gauze and Band-Aid    Post-procedure details: Patient was observed during the procedure.  Post-procedure instructions were reviewed.  Patient left the clinic in stable condition.   Clinical History: MRI LUMBAR SPINE WITHOUT CONTRAST  TECHNIQUE: Multiplanar, multisequence MR imaging of the lumbar spine was performed. No intravenous contrast was administered.  COMPARISON:  CT abdomen 07/23/2012  FINDINGS: Segmentation:  5 lumbar type vertebral bodies.  Alignment: Curvature convex to the left with the apex at L1-2 and convex to the right with the apex at L5. 2 mm retrolisthesis at L2-3.  Vertebrae: No fracture or primary bone lesion. Discogenic marrow changes affecting the L1 and L2 vertebral bodies, more on the right.  Conus medullaris and cauda equina: Conus extends to the L1 level. Conus and cauda equina appear normal.  Paraspinal and other soft tissues: Negative  Disc levels:  T12-L1: Mild noncompressive disc bulge.  Mild facet hypertrophy.  L1-2: Disc degeneration worse on the right with endplate osteophytes and bulging of the disc. Discogenic marrow change on the right. Facet degeneration right more than left. Right foraminal narrowing that could affect the right L1 nerve.  L2-3: 2 mm retrolisthesis. Disc degeneration with endplate osteophytes and shallow protrusion of the disc more towards the right. Facet and ligamentous hypertrophy. Spinal stenosis at this level which could cause neural compression on either or both sides. Canal stenosis is worse on the right. Foraminal stenosis on the right could affect the right L2 nerve.  L3-4: Mild bulging of the disc. Facet and ligamentous hypertrophy. Mild lateral recess narrowing without visible neural compression.  L4-5: Mild bulging of the disc. Bilateral facet and ligamentous hypertrophy. Narrowing of the lateral recesses and foramina left more than right. Definite neural compression is not established however.  L5-S1: Mild bulging of the disc. Facet osteoarthritis left more than right.  Mild narrowing of the subarticular lateral recess and intervertebral foramen on the left but without likely neural compression.  IMPRESSION: Curvature convex to the left with the apex at L1-2. Right-sided canal and foraminal stenosis worse at L2-3 than at L1-2 with potential for neural compression, particularly at the L2-3 level. Right-sided discogenic marrow changes at L1-2 could contribute to regional back pain.   Electronically Signed   By: Nelson Chimes M.D.   On: 10/16/2018 14:33  LUMBAR SPINE - COMPLETE WITH BENDING VIEWS - 7 views total  COMPARISON: None.  FINDINGS: There is 15 degrees of levoconvex thoracolumbar scoliosis as measured between T11 and L4. Small ribs are present at the L1 level. Calcifications project over the right renal shadow.  Endplate sclerosis and loss of disc height noted at L1-2 and L2-3. Facet arthropathy noted on the left at L5-S1 and L4-5. There is a suggestion of up to 3 mm retrolisthesis at L3-4 and L2-3. These do not appreciably change with flexion or extension. No abnormal motion on bending views.  IMPRESSION: 1. 3 mm retrolisthesis at L3-4 and L2-3, not changing with flexion or extension. No abnormal motion with flexion or extension. 2. 15 degrees of levoconvex thoracolumbar scoliosis. 3. Degenerative disc  disease and endplate sclerosis at D6-2 and L2-3. Lower lumbar facet arthropathy, left greater than right. 4. Suspected right nephrolithiasis.   Electronically Signed By: Sherryl Barters M.D. On: 01/23/2014 14:28     Objective:  VS:  HT:    WT:   BMI:     BP:(!) 145/86  HR:67bpm  TEMP: ( )  RESP:  Physical Exam  Ortho Exam Imaging: Xr C-arm No Report  Result Date: 03/11/2019 Please see Notes tab for imaging impression.

## 2019-03-11 NOTE — Procedures (Signed)
Lumbar Epidural Steroid Injection - Interlaminar Approach with Fluoroscopic Guidance  Patient: Jessica Reilly      Date of Birth: April 18, 1961 MRN: [id number] PCP: Sharilyn Sites, MD      Visit Date: 03/11/2019   Universal Protocol:     Consent Given By: the patient  Position: PRONE  Additional Comments: Vital signs were monitored before and after the procedure. Patient was prepped and draped in the usual sterile fashion. The correct patient, procedure, and site was verified.   Injection Procedure Details:  Procedure Site One Meds Administered:  Meds ordered this encounter  Medications  . methylPREDNISolone acetate (DEPO-MEDROL) injection 80 mg  . methocarbamol (ROBAXIN) 500 MG tablet    Sig: Take 1 tablet (500 mg total) by mouth every 6 (six) hours as needed for muscle spasms.    Dispense:  90 tablet    Refill:  1     Laterality: Right  Location/Site:  L3-L4  Needle size: 20 G  Needle type: Tuohy  Needle Placement: Paramedian epidural  Findings:   -Comments: Excellent flow of contrast into the epidural space.  Initial flow of contrast seen to outline the right L3-4 facet joint and that of the needle was medially placed intraluminally.  With repositioning we did get flow contrast epidural.  Procedure Details: Using a paramedian approach from the side mentioned above, the region overlying the inferior lamina was localized under fluoroscopic visualization and the soft tissues overlying this structure were infiltrated with 4 ml. of 1% Lidocaine without Epinephrine. The Tuohy needle was inserted into the epidural space using a paramedian approach.   The epidural space was localized using loss of resistance along with lateral and bi-planar fluoroscopic views.  After negative aspirate for air, blood, and CSF, a 2 ml. volume of Isovue-250 was injected into the epidural space and the flow of contrast was observed. Radiographs were obtained for documentation purposes.    The  injectate was administered into the level noted above.   Additional Comments:  The patient tolerated the procedure well Dressing: 2 x 2 sterile gauze and Band-Aid    Post-procedure details: Patient was observed during the procedure. Post-procedure instructions were reviewed.  Patient left the clinic in stable condition.

## 2019-03-17 ENCOUNTER — Telehealth (INDEPENDENT_AMBULATORY_CARE_PROVIDER_SITE_OTHER): Payer: PPO | Admitting: Pharmacist

## 2019-03-17 DIAGNOSIS — Z86718 Personal history of other venous thrombosis and embolism: Secondary | ICD-10-CM

## 2019-03-17 DIAGNOSIS — D689 Coagulation defect, unspecified: Secondary | ICD-10-CM

## 2019-03-17 DIAGNOSIS — I82629 Acute embolism and thrombosis of deep veins of unspecified upper extremity: Secondary | ICD-10-CM

## 2019-03-17 DIAGNOSIS — Z5181 Encounter for therapeutic drug level monitoring: Secondary | ICD-10-CM

## 2019-03-17 DIAGNOSIS — D6851 Activated protein C resistance: Secondary | ICD-10-CM

## 2019-03-17 DIAGNOSIS — Z7901 Long term (current) use of anticoagulants: Secondary | ICD-10-CM

## 2019-03-17 LAB — POCT INR: INR: 1.4 — AB (ref 2.0–3.0)

## 2019-03-17 NOTE — Progress Notes (Signed)
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 Patient has a home INR meter. Identity was verified using date of birth and address.  Indication: DVThistory, Factor V Leiden mutation Duration: indefinite Supervising physician:Vinay Gudena  Anticoagulation Clinic Visit History: Patientdoes notreport signs/symptoms of bleeding or thromboembolism or other changes. One week ago, patient received steroid injection for which she held warfarin for 5 days, re-started 6/2 (7.5 mg daily, except 3.75 mg on Tuesdays and Thursdays). No medication, dietary, or other changes reported.  Anticoagulation Episode Summary    Current INR goal:   2.0-3.0  TTR:   72.5 % (6.9 y)  Next INR check:   03/14/2019  INR from last check:   2.4 (02/25/2019)  Most recent INR:    1.4! (03/17/2019)  Weekly max warfarin dose:     Target end date:   04/11/2015  INR check location:     Preferred lab:     Send INR reminders to:   [place]   Indications   DVT of upper extremity (deep vein thrombosis) ([place]) [I82.629] Factor V Leiden mutation ([place]) [D68.51] Coagulopathy ([place]) [D68.9] Chronic anticoagulation [Z79.01]       Comments:         Anticoagulation Care Providers    Provider Role Specialty Phone number   Nicholas Lose, MD Responsible Hematology and Oncology [phone]     Allergies  Allergen Reactions  . Amoxicillin-Pot Clavulanate Rash  . Ceftin Rash  . Cefuroxime Rash  . Cefuroxime Axetil Rash  . Risperidone And Related Other (See Comments)    Has medical contraindications   Medication Sig  acetaminophen (TYLENOL) 500 MG tablet Take 1,000 mg by mouth as needed.  alprazolam (XANAX) 2 MG tablet Take 2 mg by mouth 2 (two) times daily as needed (May take a 1 mg during the day but always takes 2 mg at bedtime.). For anxiety  anastrozole (ARIMIDEX) 1 MG tablet Take 1 tablet (1 mg total) by mouth daily.  Calcium  Carbonate-Vit D-Min (CALCIUM 600+D3 PLUS MINERALS) 600-800 MG-UNIT TABS Take by mouth.  cyanocobalamin (,VITAMIN B-12,) 1000 MCG/ML injection Inject 100 mcg into the muscle every 30 (thirty) days.   docusate sodium (STOOL SOFTENER) 100 MG capsule Take 2 capsules (200 mg total) by mouth at bedtime.  esomeprazole (NEXIUM) 40 MG capsule Take 40 mg by mouth 2 (two) times daily before a meal.  fluticasone (FLONASE) 50 MCG/ACT nasal spray Place 2 sprays into the nose daily.   ibuprofen (ADVIL,MOTRIN) 800 MG tablet Take 800 mg by mouth as needed.   levocetirizine (XYZAL) 5 MG tablet TAKE ONE TABLET BY MOUTH AT BEDTIME.  lidocaine (LIDODERM) 5 % Place 1 patch onto the skin as needed. Remove & Discard patch within 12 hours or as directed by MD (only uses prn)  methocarbamol (ROBAXIN) 500 MG tablet Take 1 tablet (500 mg total) by mouth every 6 (six) hours as needed for muscle spasms.  metoprolol (TOPROL-XL) 100 MG 24 hr tablet Take 100 mg by mouth daily.    omega-3 fish oil (MAXEPA) 1000 MG CAPS capsule Take 1 capsule (1,000 mg total) by mouth 2 (two) times daily.  promethazine (PHENERGAN) 25 MG tablet Take 25 mg by mouth as needed for nausea.   simvastatin (ZOCOR) 20 MG tablet Take 20 mg by mouth daily.   warfarin (COUMADIN) 7.5 MG tablet Take 1 tablet (7.5 mg total) by mouth daily. Except 1/2 tablet on Tue/Thur   Past Medical History:  Diagnosis Date  . Abnormal  Pap smear   . Acid reflux   . Bipolar disorder ([place]) 03/22/2013  . Breast cancer ([place])   . Breast disorder    cancer  . Chronic anticoagulation 03/22/2013  . Coagulopathy ([place]) 04/10/2012  . Constipation   . DVT of upper extremity (deep vein thrombosis) ([place]) 04/10/2012  . Factor V Leiden ([place])   . Factor V Leiden mutation ([place]) 04/10/2012  . Family history of ovarian cancer   . Fatty liver 06/04/2017  . Fibroid   . Gallstones   . Heart murmur   . High cholesterol   . History of abnormal cervical Pap smear 01/06/2015  . History of breast  cancer 01/06/2015  . HTN (hypertension)   . HX: breast cancer   . Hypertension 12/31/2012  . Kidney stones   . Kidney stones 06/04/2017  . Lobular carcinoma of breast, estrogen receptor positive, 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  . Mental disorder    panic attacks  . Osteoarthritis   . Pain in both knees   . Vaginal Pap smear, abnormal    Social History   Socioeconomic History  . Marital status: Divorced    Spouse name: Not on file  . Number of children: Not on file  . Years of education: Not on file  . Highest education level: Not on file  Occupational History  . Not on file  Social Needs  . Financial resource strain: Not on file  . Food insecurity:    Worry: Not on file    Inability: Not on file  . Transportation needs:    Medical: Not on file    Non-medical: Not on file  Tobacco Use  . Smoking status: Never Smoker  . Smokeless tobacco: Never Used  Substance and Sexual Activity  . Alcohol use: Yes    Alcohol/week: 0.0 standard drinks    Comment: Rarely.  . Drug use: No  . Sexual activity: Never    Birth control/protection: Surgical    Comment: partial hyst  Lifestyle  . Physical activity:    Days per week: Not on file    Minutes per session: Not on file  . Stress: Not on file  Relationships  . Social connections:    Talks on phone: Not on file    Gets together: Not on file    Attends religious service: Not on file    Active member of club or organization: Not on file    Attends meetings of clubs or organizations: Not on file    Relationship status: Not on file  Other Topics Concern  . Not on file  Social History Narrative  . Not on file   Family History  Problem Relation Age of Onset  . Alcohol abuse Mother   . COPD Mother   . Alcohol abuse Father   . Heart disease Father   . Pneumonia Father   . Diabetes Father   . Colon cancer Father        Dx 21s; deceased 15s  . OCD Daughter   . Bipolar disorder Daughter   . Hypertension Daughter   . Schizophrenia Daughter    . Schizophrenia Daughter   . Bipolar disorder Daughter   . OCD Daughter   . Hypertension Daughter   . COPD Daughter   . Other Daughter        stomach don't empty out  . ADD / ADHD Grandchild   . Anxiety disorder Grandchild   . Depression Grandchild   . Healthy Sister   . Cancer Brother  leukemia 33s; deceased 85  . Heart disease Brother   . Cancer Brother        liver; currently 38  . Cirrhosis Brother   . Other Brother        waiting for liver transplant  . Alzheimer's disease Paternal Grandmother   . Ovarian cancer Maternal Grandmother 56       deceased 67  . Cancer Maternal Uncle        3 of 6 with various cancers: skin, NHL, Hodkin's Dx  . Breast cancer Cousin 28       mat first cousin related through unaffected aunt   ASSESSMENT Recent Results: Lab Results  Component Value Date   INR 1.4 (A) 03/17/2019   INR 2.4 02/25/2019   INR 2.2 02/10/2019   PROTIME 27.6 (H) 07/30/2013   Anticoagulation Dosing: Description   DR Geralyn Flash PATIENT     INR today: Subtherapeutic  PLAN Increase warfarin to 7.5 mg daily from 45 mg weekly to 52.5 mg weekly, initiate enoxaparin 150 mg daily until INR therapeutic, re-check INR in 4 days.  Patient advised to contact clinic or seek medical attention if signs/symptoms of bleeding or thromboembolism occur.  Patient verbalized understanding by repeating back information and was advised to contact me if further medication-related questions arise. Patient was also provided an information handout.  Follow-up 4 days  [name]

## 2019-03-21 ENCOUNTER — Telehealth (INDEPENDENT_AMBULATORY_CARE_PROVIDER_SITE_OTHER): Payer: PPO | Admitting: Pharmacist

## 2019-03-21 DIAGNOSIS — Z7901 Long term (current) use of anticoagulants: Secondary | ICD-10-CM | POA: Diagnosis not present

## 2019-03-21 DIAGNOSIS — D689 Coagulation defect, unspecified: Secondary | ICD-10-CM

## 2019-03-21 DIAGNOSIS — Z86718 Personal history of other venous thrombosis and embolism: Secondary | ICD-10-CM | POA: Diagnosis not present

## 2019-03-21 DIAGNOSIS — D6851 Activated protein C resistance: Secondary | ICD-10-CM

## 2019-03-21 DIAGNOSIS — Z5181 Encounter for therapeutic drug level monitoring: Secondary | ICD-10-CM | POA: Diagnosis not present

## 2019-03-21 DIAGNOSIS — I82629 Acute embolism and thrombosis of deep veins of unspecified upper extremity: Secondary | ICD-10-CM

## 2019-03-21 LAB — POCT INR: INR: 1.8 — AB (ref 2.0–3.0)

## 2019-03-21 NOTE — Progress Notes (Signed)
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 Patient has a home INR meter. Identity was verified using date of birth and address.  Indication: DVThistory, Factor V Leiden mutation Duration: indefinite Supervising physician:Vinay Gudena  Anticoagulation Clinic Visit History: Patientdoes notreport signs/symptoms of bleeding or thromboembolism or other changes. Patient received steroid injection for which she held warfarin for 5 days, re-started 6/2 (7.5 mg daily, except 3.75 mg on Tuesdays and Thursdays). No medication, dietary, or other changes reported.  Anticoagulation Episode Summary    Current INR goal:  2.0-3.0  TTR:  72.4 % (6.9 y)  Next INR check:  03/24/2019  INR from last check:  1.8 (03/21/2019)  Weekly max warfarin dose:    Target end date:  04/11/2015  INR check location:    Preferred lab:    Send INR reminders to:  [facility]   Indications   DVT of upper extremity (deep vein thrombosis) (HCC) [I82.629] Factor V Leiden mutation ([place]) [D68.51] Coagulopathy ([place]) [D68.9] Chronic anticoagulation [Z79.01]       Comments:        Anticoagulation Care Providers    Provider Role Specialty Phone number   Nicholas Lose, MD Responsible Hematology and Oncology [phone]     Allergies  Allergen Reactions  . Amoxicillin-Pot Clavulanate Rash  . Ceftin Rash  . Cefuroxime Rash  . Cefuroxime Axetil Rash  . Risperidone And Related Other (See Comments)    Has medical contraindications   Medication Sig  acetaminophen (TYLENOL) 500 MG tablet Take 1,000 mg by mouth as needed.  alprazolam (XANAX) 2 MG tablet Take 2 mg by mouth 2 (two) times daily as needed (May take a 1 mg during the day but always takes 2 mg at bedtime.). For anxiety  anastrozole (ARIMIDEX) 1 MG tablet Take 1 tablet (1 mg total) by mouth daily.  Calcium Carbonate-Vit D-Min (CALCIUM 600+D3 PLUS MINERALS) 600-800 MG-UNIT  TABS Take by mouth.  cyanocobalamin (,VITAMIN B-12,) 1000 MCG/ML injection Inject 100 mcg into the muscle every 30 (thirty) days.   docusate sodium (STOOL SOFTENER) 100 MG capsule Take 2 capsules (200 mg total) by mouth at bedtime.  esomeprazole (NEXIUM) 40 MG capsule Take 40 mg by mouth 2 (two) times daily before a meal.  fluticasone (FLONASE) 50 MCG/ACT nasal spray Place 2 sprays into the nose daily.   ibuprofen (ADVIL,MOTRIN) 800 MG tablet Take 800 mg by mouth as needed.   levocetirizine (XYZAL) 5 MG tablet TAKE ONE TABLET BY MOUTH AT BEDTIME.  lidocaine (LIDODERM) 5 % Place 1 patch onto the skin as needed. Remove & Discard patch within 12 hours or as directed by MD (only uses prn)  methocarbamol (ROBAXIN) 500 MG tablet Take 1 tablet (500 mg total) by mouth every 6 (six) hours as needed for muscle spasms.  metoprolol (TOPROL-XL) 100 MG 24 hr tablet Take 100 mg by mouth daily.    omega-3 fish oil (MAXEPA) 1000 MG CAPS capsule Take 1 capsule (1,000 mg total) by mouth 2 (two) times daily.  promethazine (PHENERGAN) 25 MG tablet Take 25 mg by mouth as needed for nausea.   simvastatin (ZOCOR) 20 MG tablet Take 20 mg by mouth daily.   warfarin (COUMADIN) 7.5 MG tablet Take 1 tablet (7.5 mg total) by mouth daily. Except 1/2 tablet on Tue/Thur   Past Medical History:  Diagnosis Date  . Abnormal Pap smear   . Acid reflux   . Bipolar disorder ([place]) 03/22/2013  . Breast cancer ([place])   .  Breast disorder    cancer  . Chronic anticoagulation 03/22/2013  . Coagulopathy ([place]) 04/10/2012  . Constipation   . DVT of upper extremity (deep vein thrombosis) ([place]) 04/10/2012  . Factor V Leiden ([place])   . Factor V Leiden mutation ([place]) 04/10/2012  . Family history of ovarian cancer   . Fatty liver 06/04/2017  . Fibroid   . Gallstones   . Heart murmur   . High cholesterol   . History of abnormal cervical Pap smear 01/06/2015  . History of breast cancer 01/06/2015  . HTN (hypertension)   . HX: breast cancer   .  Hypertension 12/31/2012  . Kidney stones   . Kidney stones 06/04/2017  . Lobular carcinoma of breast, estrogen receptor positive, 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  . Mental disorder    panic attacks  . Osteoarthritis   . Pain in both knees   . Vaginal Pap smear, abnormal    Social History   Socioeconomic History  . Marital status: Divorced    Spouse name: Not on file  . Number of children: Not on file  . Years of education: Not on file  . Highest education level: Not on file  Occupational History  . Not on file  Social Needs  . Financial resource strain: Not on file  . Food insecurity    Worry: Not on file    Inability: Not on file  . Transportation needs    Medical: Not on file    Non-medical: Not on file  Tobacco Use  . Smoking status: Never Smoker  . Smokeless tobacco: Never Used  Substance and Sexual Activity  . Alcohol use: Yes    Alcohol/week: 0.0 standard drinks    Comment: Rarely.  . Drug use: No  . Sexual activity: Never    Birth control/protection: Surgical    Comment: partial hyst  Lifestyle  . Physical activity    Days per week: Not on file    Minutes per session: Not on file  . Stress: Not on file  Relationships  . Social Herbalist on phone: Not on file    Gets together: Not on file    Attends religious service: Not on file    Active member of club or organization: Not on file    Attends meetings of clubs or organizations: Not on file    Relationship status: Not on file  Other Topics Concern  . Not on file  Social History Narrative  . Not on file   Family History  Problem Relation Age of Onset  . Alcohol abuse Mother   . COPD Mother   . Alcohol abuse Father   . Heart disease Father   . Pneumonia Father   . Diabetes Father   . Colon cancer Father        Dx 36s; deceased 79s  . OCD Daughter   . Bipolar disorder Daughter   . Hypertension Daughter   . Schizophrenia Daughter   . Schizophrenia Daughter   . Bipolar disorder Daughter   . OCD  Daughter   . Hypertension Daughter   . COPD Daughter   . Other Daughter        stomach don't empty out  . ADD / ADHD Grandchild   . Anxiety disorder Grandchild   . Depression Grandchild   . Healthy Sister   . Cancer Brother        leukemia 67s; deceased 3  . Heart disease Brother   . Cancer Brother  liver; currently 73  . Cirrhosis Brother   . Other Brother        waiting for liver transplant  . Alzheimer's disease Paternal Grandmother   . Ovarian cancer Maternal Grandmother 87       deceased 58  . Cancer Maternal Uncle        3 of 6 with various cancers: skin, NHL, Hodkin's Dx  . Breast cancer Cousin 107       mat first cousin related through unaffected aunt   ASSESSMENT Recent Results: Lab Results  Component Value Date   INR 1.8 (A) 03/21/2019   INR 1.4 (A) 03/17/2019   INR 2.4 02/25/2019   PROTIME 27.6 (H) 07/30/2013   Anticoagulation Dosing: Description   DR Geralyn Flash PATIENT     INR today: Slightly subtherapeutic  PLAN Weekly dose was increased from 52.5 mg 60 mg weekly.  Patient advised to contact clinic or seek medical attention if signs/symptoms of bleeding or thromboembolism occur.  Patient verbalized understanding by repeating back information and was advised to contact me if further medication-related questions arise. Patient was also provided an information handout.  Follow-up 3 days  Jessica Reilly

## 2019-03-24 ENCOUNTER — Telehealth (INDEPENDENT_AMBULATORY_CARE_PROVIDER_SITE_OTHER): Payer: PPO | Admitting: Pharmacist

## 2019-03-24 DIAGNOSIS — Z5181 Encounter for therapeutic drug level monitoring: Secondary | ICD-10-CM

## 2019-03-24 DIAGNOSIS — I82629 Acute embolism and thrombosis of deep veins of unspecified upper extremity: Secondary | ICD-10-CM

## 2019-03-24 DIAGNOSIS — D689 Coagulation defect, unspecified: Secondary | ICD-10-CM

## 2019-03-24 DIAGNOSIS — Z7901 Long term (current) use of anticoagulants: Secondary | ICD-10-CM

## 2019-03-24 DIAGNOSIS — D6851 Activated protein C resistance: Secondary | ICD-10-CM | POA: Diagnosis not present

## 2019-03-24 DIAGNOSIS — Z86718 Personal history of other venous thrombosis and embolism: Secondary | ICD-10-CM

## 2019-03-24 LAB — POCT INR: INR: 2.7 (ref 2.0–3.0)

## 2019-03-25 NOTE — Progress Notes (Signed)
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 Patient has a home INR meter. Identity was verified using date of birth and address.  Indication: DVThistory, Factor V Leiden mutation Duration: indefinite Supervising physician:Vinay Gudena  Anticoagulation Clinic Visit History: Patientdoes notreport signs/symptoms of bleeding or thromboembolism or other changes.  Anticoagulation Episode Summary    Current INR goal:  2.0-3.0  TTR:  72.4 % (6.9 y)  Next INR check:  03/31/2019  INR from last check:  2.7 (03/24/2019)  Weekly max warfarin dose:    Target end date:  04/11/2015  INR check location:    Preferred lab:    Send INR reminders to:  [facility]   Indications   DVT of upper extremity (deep vein thrombosis) ([place]) [I82.629] Factor V Leiden mutation ([place]) [D68.51] Coagulopathy ([place]) [D68.9] Chronic anticoagulation [Z79.01]       Comments:        Anticoagulation Care Providers    Provider Role Specialty Phone number   Nicholas Lose, MD Responsible Hematology and Oncology [phone]      Allergies  Allergen Reactions  . Amoxicillin-Pot Clavulanate Rash  . Ceftin Rash  . Cefuroxime Rash  . Cefuroxime Axetil Rash  . Risperidone And Related Other (See Comments)    Has medical contraindications    Current Outpatient Medications:  .  acetaminophen (TYLENOL) 500 MG tablet, Take 1,000 mg by mouth as needed., Disp: , Rfl:  .  alprazolam (XANAX) 2 MG tablet, Take 2 mg by mouth 2 (two) times daily as needed (May take a 1 mg during the day but always takes 2 mg at bedtime.). For anxiety, Disp: , Rfl:  .  anastrozole (ARIMIDEX) 1 MG tablet, Take 1 tablet (1 mg total) by mouth daily., Disp: 90 tablet, Rfl: 3 .  Calcium Carbonate-Vit D-Min (CALCIUM 600+D3 PLUS MINERALS) 600-800 MG-UNIT TABS, Take by mouth., Disp: , Rfl:  .  cyanocobalamin (,VITAMIN B-12,) 1000 MCG/ML injection, Inject 100 mcg  into the muscle every 30 (thirty) days. , Disp: , Rfl:  .  docusate sodium (STOOL SOFTENER) 100 MG capsule, Take 2 capsules (200 mg total) by mouth at bedtime., Disp: 10 capsule, Rfl: 0 .  esomeprazole (NEXIUM) 40 MG capsule, Take 40 mg by mouth 2 (two) times daily before a meal., Disp: , Rfl:  .  fluticasone (FLONASE) 50 MCG/ACT nasal spray, Place 2 sprays into the nose daily. , Disp: , Rfl:  .  ibuprofen (ADVIL,MOTRIN) 800 MG tablet, Take 800 mg by mouth as needed. , Disp: , Rfl:  .  levocetirizine (XYZAL) 5 MG tablet, TAKE ONE TABLET BY MOUTH AT BEDTIME., Disp: 30 tablet, Rfl: 3 .  lidocaine (LIDODERM) 5 %, Place 1 patch onto the skin as needed. Remove & Discard patch within 12 hours or as directed by MD (only uses prn), Disp: , Rfl:  .  methocarbamol (ROBAXIN) 500 MG tablet, Take 1 tablet (500 mg total) by mouth every 6 (six) hours as needed for muscle spasms., Disp: 90 tablet, Rfl: 1 .  metoprolol (TOPROL-XL) 100 MG 24 hr tablet, Take 100 mg by mouth daily.  , Disp: , Rfl:  .  omega-3 fish oil (MAXEPA) 1000 MG CAPS capsule, Take 1 capsule (1,000 mg total) by mouth 2 (two) times daily., Disp: 180 each, Rfl: 3 .  promethazine (PHENERGAN) 25 MG tablet, Take 25 mg by mouth as needed for nausea. , Disp: , Rfl:  .  simvastatin (ZOCOR) 20 MG tablet, Take 20 mg by mouth daily. ,  Disp: , Rfl:  .  warfarin (COUMADIN) 7.5 MG tablet, Take 1 tablet (7.5 mg total) by mouth daily. Except 1/2 tablet on Tue/Thur, Disp: 32 tablet, Rfl: 6 Past Medical History:  Diagnosis Date  . Abnormal Pap smear   . Acid reflux   . Bipolar disorder ([place]) 03/22/2013  . Breast cancer ([place])   . Breast disorder    cancer  . Chronic anticoagulation 03/22/2013  . Coagulopathy ([place]) 04/10/2012  . Constipation   . DVT of upper extremity (deep vein thrombosis) ([place]) 04/10/2012  . Factor V Leiden ([place])   . Factor V Leiden mutation ([place]) 04/10/2012  . Family history of ovarian cancer   . Fatty liver 06/04/2017  . Fibroid   .  Gallstones   . Heart murmur   . High cholesterol   . History of abnormal cervical Pap smear 01/06/2015  . History of breast cancer 01/06/2015  . HTN (hypertension)   . HX: breast cancer   . Hypertension 12/31/2012  . Kidney stones   . Kidney stones 06/04/2017  . Lobular carcinoma of breast, estrogen receptor positive, 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  . Mental disorder    panic attacks  . Osteoarthritis   . Pain in both knees   . Vaginal Pap smear, abnormal    Social History   Socioeconomic History  . Marital status: Divorced    Spouse name: Not on file  . Number of children: Not on file  . Years of education: Not on file  . Highest education level: Not on file  Occupational History  . Not on file  Social Needs  . Financial resource strain: Not on file  . Food insecurity    Worry: Not on file    Inability: Not on file  . Transportation needs    Medical: Not on file    Non-medical: Not on file  Tobacco Use  . Smoking status: Never Smoker  . Smokeless tobacco: Never Used  Substance and Sexual Activity  . Alcohol use: Yes    Alcohol/week: 0.0 standard drinks    Comment: Rarely.  . Drug use: No  . Sexual activity: Never    Birth control/protection: Surgical    Comment: partial hyst  Lifestyle  . Physical activity    Days per week: Not on file    Minutes per session: Not on file  . Stress: Not on file  Relationships  . Social Herbalist on phone: Not on file    Gets together: Not on file    Attends religious service: Not on file    Active member of club or organization: Not on file    Attends meetings of clubs or organizations: Not on file    Relationship status: Not on file  Other Topics Concern  . Not on file  Social History Narrative  . Not on file   Family History  Problem Relation Age of Onset  . Alcohol abuse Mother   . COPD Mother   . Alcohol abuse Father   . Heart disease Father   . Pneumonia Father   . Diabetes Father   . Colon cancer Father         Dx 62s; deceased 16s  . OCD Daughter   . Bipolar disorder Daughter   . Hypertension Daughter   . Schizophrenia Daughter   . Schizophrenia Daughter   . Bipolar disorder Daughter   . OCD Daughter   . Hypertension Daughter   . COPD Daughter   . Other  Daughter        stomach don't empty out  . ADD / ADHD Grandchild   . Anxiety disorder Grandchild   . Depression Grandchild   . Healthy Sister   . Cancer Brother        leukemia 43s; deceased 59  . Heart disease Brother   . Cancer Brother        liver; currently 40  . Cirrhosis Brother   . Other Brother        waiting for liver transplant  . Alzheimer's disease Paternal Grandmother   . Ovarian cancer Maternal Grandmother 68       deceased 67  . Cancer Maternal Uncle        3 of 6 with various cancers: skin, NHL, Hodkin's Dx  . Breast cancer Cousin 76       mat first cousin related through unaffected aunt    ASSESSMENT Recent Results: Lab Results  Component Value Date   INR 2.7 03/24/2019   INR 1.8 (A) 03/21/2019   INR 1.4 (A) 03/17/2019   PROTIME 27.6 (H) 07/30/2013   Anticoagulation Dosing: Description   DR Geralyn Flash PATIENT     INR today: Therapeutic  PLAN Patient was advised to resume her previous warfarin dose prior to the steroid injection since she had held warfarin for 5 days resulting in subtherapeutic levels last week (1 tablet daily, except 0.5 tablets on Tuesdays and Thursdays), follow up in 1 week.  There are no Patient Instructions on file for this visit. Patient advised to contact clinic or seek medical attention if signs/symptoms of bleeding or thromboembolism occur.  Patient verbalized understanding by repeating back information and was advised to contact me if further medication-related questions arise. Patient was also provided an information handout.  Follow-up 1 week  [name]

## 2019-04-01 ENCOUNTER — Telehealth: Payer: Self-pay | Admitting: *Deleted

## 2019-04-01 NOTE — Telephone Encounter (Signed)
Received letter from [facility] stating they have not received any PT/INR home testing results from pt since 02/25/2019.  Stated in letter they attempted to call pt several times with no return call.  RN attempt x1 to call pt to see if she was experiencing any issues with the home testing.  No answer, LVM to return call.

## 2019-04-02 ENCOUNTER — Telehealth: Payer: Self-pay | Admitting: *Deleted

## 2019-04-02 NOTE — Telephone Encounter (Signed)
Received call from pt regarding VM left by RN yesterday.  Pt states she is taking her PT/INR with [facility] regularly and unsure why the office received a letter in the mail stating they had not received testing results from pt.  RN placed call to [facility] to explain the the pt states she calls in her results regularly.  Customer service representative verified that they are receiving results regularly and to disregard the letter the office received.  Pt notified that RN spoke with [facility] regarding the misunderstanding.  Pt verbalized understanding and thankful for RN following up.

## 2019-04-03 ENCOUNTER — Telehealth: Payer: Self-pay | Admitting: *Deleted

## 2019-04-03 DIAGNOSIS — Z7901 Long term (current) use of anticoagulants: Secondary | ICD-10-CM | POA: Diagnosis not present

## 2019-04-03 DIAGNOSIS — D6851 Activated protein C resistance: Secondary | ICD-10-CM | POA: Diagnosis not present

## 2019-04-03 NOTE — Telephone Encounter (Signed)
Received call from [facility] stating pt INR reading from today was 1.4.  RN placed call to pt to see if she had missed a dose of warfarin or any changes in her diet.  No answer, LVM to return call.

## 2019-04-04 ENCOUNTER — Telehealth: Payer: Self-pay

## 2019-04-04 NOTE — Telephone Encounter (Signed)
RN spoke with patient regarding faxed results of INR.  Patient informed that [name], pharmacist follows up with INR results and patient has been made aware to change dose of Coumadin to 7.5mg  daily.  Dr. Lindi Adie notified and in agreement.

## 2019-04-07 ENCOUNTER — Telehealth: Payer: Self-pay | Admitting: Physical Medicine and Rehabilitation

## 2019-04-07 NOTE — Telephone Encounter (Signed)
Transforaminal one time, prior was interlam

## 2019-04-08 NOTE — Telephone Encounter (Signed)
Scheduled for 7/15 at 1345 with driver. Patient will continue Coumadin for transforaminal.

## 2019-04-16 ENCOUNTER — Telehealth (INDEPENDENT_AMBULATORY_CARE_PROVIDER_SITE_OTHER): Payer: PPO | Admitting: Pharmacist

## 2019-04-16 DIAGNOSIS — Z7901 Long term (current) use of anticoagulants: Secondary | ICD-10-CM | POA: Diagnosis not present

## 2019-04-16 DIAGNOSIS — D6851 Activated protein C resistance: Secondary | ICD-10-CM

## 2019-04-16 DIAGNOSIS — D689 Coagulation defect, unspecified: Secondary | ICD-10-CM

## 2019-04-16 DIAGNOSIS — I82629 Acute embolism and thrombosis of deep veins of unspecified upper extremity: Secondary | ICD-10-CM | POA: Diagnosis not present

## 2019-04-16 LAB — POCT INR: INR: 2.4 (ref 2.0–3.0)

## 2019-04-16 NOTE — Progress Notes (Signed)
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 Patient has a home INR meter. Identity was verified using date of birth and address.  Indication: DVThistory, Factor V Leiden mutation Duration: indefinite Supervising physician:Vinay Gudena  Anticoagulation Clinic Visit History: Patientdoes notreport signs/symptoms of bleeding or thromboembolism or other changes.  Anticoagulation Episode Summary    Current INR goal:  2.0-3.0  TTR:  72.4 % (6.9 y)  Next INR check:  03/31/2019  INR from last check:  2.7 (03/24/2019)  Weekly max warfarin dose:    Target end date:  04/11/2015  INR check location:    Preferred lab:    Send INR reminders to:  [facility]   Indications   DVT of upper extremity (deep vein thrombosis) ([place]) [I82.629] Factor V Leiden mutation ([place]) [D68.51] Coagulopathy ([place]) [D68.9] Chronic anticoagulation [Z79.01]       Comments:        Anticoagulation Care Providers    Provider Role Specialty Phone number   Nicholas Lose, MD Responsible Hematology and Oncology [phone]     Allergies  Allergen Reactions  . Amoxicillin-Pot Clavulanate Rash  . Ceftin Rash  . Cefuroxime Rash  . Cefuroxime Axetil Rash  . Risperidone And Related Other (See Comments)    Has medical contraindications    Current Outpatient Medications:  .  acetaminophen (TYLENOL) 500 MG tablet, Take 1,000 mg by mouth as needed., Disp: , Rfl:  .  alprazolam (XANAX) 2 MG tablet, Take 2 mg by mouth 2 (two) times daily as needed (May take a 1 mg during the day but always takes 2 mg at bedtime.). For anxiety, Disp: , Rfl:  .  anastrozole (ARIMIDEX) 1 MG tablet, Take 1 tablet (1 mg total) by mouth daily., Disp: 90 tablet, Rfl: 3 .  Calcium Carbonate-Vit D-Min (CALCIUM 600+D3 PLUS MINERALS) 600-800 MG-UNIT TABS, Take by mouth., Disp: , Rfl:  .  cyanocobalamin (,VITAMIN B-12,) 1000 MCG/ML injection, Inject 100 mcg into  the muscle every 30 (thirty) days. , Disp: , Rfl:  .  docusate sodium (STOOL SOFTENER) 100 MG capsule, Take 2 capsules (200 mg total) by mouth at bedtime., Disp: 10 capsule, Rfl: 0 .  esomeprazole (NEXIUM) 40 MG capsule, Take 40 mg by mouth 2 (two) times daily before a meal., Disp: , Rfl:  .  fluticasone (FLONASE) 50 MCG/ACT nasal spray, Place 2 sprays into the nose daily. , Disp: , Rfl:  .  ibuprofen (ADVIL,MOTRIN) 800 MG tablet, Take 800 mg by mouth as needed. , Disp: , Rfl:  .  levocetirizine (XYZAL) 5 MG tablet, TAKE ONE TABLET BY MOUTH AT BEDTIME., Disp: 30 tablet, Rfl: 3 .  lidocaine (LIDODERM) 5 %, Place 1 patch onto the skin as needed. Remove & Discard patch within 12 hours or as directed by MD (only uses prn), Disp: , Rfl:  .  methocarbamol (ROBAXIN) 500 MG tablet, Take 1 tablet (500 mg total) by mouth every 6 (six) hours as needed for muscle spasms., Disp: 90 tablet, Rfl: 1 .  metoprolol (TOPROL-XL) 100 MG 24 hr tablet, Take 100 mg by mouth daily.  , Disp: , Rfl:  .  omega-3 fish oil (MAXEPA) 1000 MG CAPS capsule, Take 1 capsule (1,000 mg total) by mouth 2 (two) times daily., Disp: 180 each, Rfl: 3 .  promethazine (PHENERGAN) 25 MG tablet, Take 25 mg by mouth as needed for nausea. , Disp: , Rfl:  .  simvastatin (ZOCOR) 20 MG tablet, Take 20 mg by mouth daily. ,  Disp: , Rfl:  .  warfarin (COUMADIN) 7.5 MG tablet, Take 1 tablet (7.5 mg total) by mouth daily. Except 1/2 tablet on Tue/Thur, Disp: 32 tablet, Rfl: 6 Past Medical History:  Diagnosis Date  . Abnormal Pap smear   . Acid reflux   . Bipolar disorder ([place]) 03/22/2013  . Breast cancer ([place])   . Breast disorder    cancer  . Chronic anticoagulation 03/22/2013  . Coagulopathy ([place]) 04/10/2012  . Constipation   . DVT of upper extremity (deep vein thrombosis) ([place]) 04/10/2012  . Factor V Leiden ([place])   . Factor V Leiden mutation ([place]) 04/10/2012  . Family history of ovarian cancer   . Fatty liver 06/04/2017  . Fibroid   . Gallstones    . Heart murmur   . High cholesterol   . History of abnormal cervical Pap smear 01/06/2015  . History of breast cancer 01/06/2015  . HTN (hypertension)   . HX: breast cancer   . Hypertension 12/31/2012  . Kidney stones   . Kidney stones 06/04/2017  . Lobular carcinoma of breast, estrogen receptor positive, 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  . Mental disorder    panic attacks  . Osteoarthritis   . Pain in both knees   . Vaginal Pap smear, abnormal    Social History   Socioeconomic History  . Marital status: Divorced    Spouse name: Not on file  . Number of children: Not on file  . Years of education: Not on file  . Highest education level: Not on file  Occupational History  . Not on file  Social Needs  . Financial resource strain: Not on file  . Food insecurity    Worry: Not on file    Inability: Not on file  . Transportation needs    Medical: Not on file    Non-medical: Not on file  Tobacco Use  . Smoking status: Never Smoker  . Smokeless tobacco: Never Used  Substance and Sexual Activity  . Alcohol use: Yes    Alcohol/week: 0.0 standard drinks    Comment: Rarely.  . Drug use: No  . Sexual activity: Never    Birth control/protection: Surgical    Comment: partial hyst  Lifestyle  . Physical activity    Days per week: Not on file    Minutes per session: Not on file  . Stress: Not on file  Relationships  . Social Herbalist on phone: Not on file    Gets together: Not on file    Attends religious service: Not on file    Active member of club or organization: Not on file    Attends meetings of clubs or organizations: Not on file    Relationship status: Not on file  Other Topics Concern  . Not on file  Social History Narrative  . Not on file   Family History  Problem Relation Age of Onset  . Alcohol abuse Mother   . COPD Mother   . Alcohol abuse Father   . Heart disease Father   . Pneumonia Father   . Diabetes Father   . Colon cancer Father        Dx 47s;  deceased 64s  . OCD Daughter   . Bipolar disorder Daughter   . Hypertension Daughter   . Schizophrenia Daughter   . Schizophrenia Daughter   . Bipolar disorder Daughter   . OCD Daughter   . Hypertension Daughter   . COPD Daughter   . Other  Daughter        stomach don't empty out  . ADD / ADHD Grandchild   . Anxiety disorder Grandchild   . Depression Grandchild   . Healthy Sister   . Cancer Brother        leukemia 65s; deceased 26  . Heart disease Brother   . Cancer Brother        liver; currently 64  . Cirrhosis Brother   . Other Brother        waiting for liver transplant  . Alzheimer's disease Paternal Grandmother   . Ovarian cancer Maternal Grandmother 63       deceased 80  . Cancer Maternal Uncle        3 of 6 with various cancers: skin, NHL, Hodkin's Dx  . Breast cancer Cousin 73       mat first cousin related through unaffected aunt   ASSESSMENT Recent Results: Lab Results  Component Value Date   INR 2.7 03/24/2019   INR 1.8 (A) 03/21/2019   INR 1.4 (A) 03/17/2019   PROTIME 27.6 (H) 07/30/2013   Anticoagulation Dosing: Description   DR Geralyn Flash PATIENT     INR today: Therapeutic  PLAN Weekly dose was unchanged   There are no Patient Instructions on file for this visit. Patient advised to contact clinic or seek medical attention if signs/symptoms of bleeding or thromboembolism occur.  Patient verbalized understanding by repeating back information and was advised to contact me if further medication-related questions arise. Patient was also provided an information handout.  Follow-up 1 week  [name]

## 2019-04-23 ENCOUNTER — Ambulatory Visit: Payer: Self-pay

## 2019-04-23 ENCOUNTER — Ambulatory Visit (INDEPENDENT_AMBULATORY_CARE_PROVIDER_SITE_OTHER): Payer: PPO | Admitting: Physical Medicine and Rehabilitation

## 2019-04-23 ENCOUNTER — Encounter: Payer: Self-pay | Admitting: Physical Medicine and Rehabilitation

## 2019-04-23 ENCOUNTER — Other Ambulatory Visit: Payer: Self-pay

## 2019-04-23 VITALS — BP 132/68 | HR 63

## 2019-04-23 DIAGNOSIS — M5416 Radiculopathy, lumbar region: Secondary | ICD-10-CM | POA: Diagnosis not present

## 2019-04-23 MED ORDER — BETAMETHASONE SOD PHOS & ACET 6 (3-3) MG/ML IJ SUSP
12.0000 mg | Freq: Once | INTRAMUSCULAR | Status: AC
  Administered 2019-04-23: 12 mg

## 2019-04-23 NOTE — Progress Notes (Signed)
 .  Numeric Pain Rating Scale and Functional Assessment Average Pain 10   In the last MONTH (on 0-10 scale) has pain interfered with the following?  1. General activity like being  able to carry out your everyday physical activities such as walking, climbing stairs, carrying groceries, or moving a chair?  Rating(9)   +Driver, +BT(warfarin, ok for injection), -Dye Allergies.

## 2019-04-25 ENCOUNTER — Other Ambulatory Visit: Payer: Self-pay | Admitting: Physical Medicine and Rehabilitation

## 2019-04-25 ENCOUNTER — Telehealth: Payer: Self-pay | Admitting: Physical Medicine and Rehabilitation

## 2019-04-25 MED ORDER — TIZANIDINE HCL 4 MG PO CAPS: 4.0000 mg | ORAL_CAPSULE | Freq: Every day | ORAL | 0 refills | Status: DC

## 2019-04-25 NOTE — Telephone Encounter (Signed)
Notified patient that prescription has been sent to her pharmacy on record.

## 2019-04-25 NOTE — Telephone Encounter (Signed)
Tizanidine, sent in, sorry to not RX then.

## 2019-04-29 DIAGNOSIS — D1801 Hemangioma of skin and subcutaneous tissue: Secondary | ICD-10-CM | POA: Diagnosis not present

## 2019-04-29 DIAGNOSIS — I781 Nevus, non-neoplastic: Secondary | ICD-10-CM | POA: Diagnosis not present

## 2019-05-13 ENCOUNTER — Encounter: Payer: Self-pay | Admitting: Hematology and Oncology

## 2019-05-13 LAB — PROTIME-INR: INR: 3.1 — AB (ref 0.9–1.1)

## 2019-05-27 NOTE — Procedures (Signed)
Lumbosacral Transforaminal Epidural Steroid Injection - Sub-Pedicular Approach with Fluoroscopic Guidance  Patient: Jessica Reilly      Date of Birth: 1961/01/20 MRN: [id number] PCP: Sharilyn Sites, MD      Visit Date: 04/23/2019   Universal Protocol:    Date/Time: 04/23/2019  Consent Given By: the patient  Position: PRONE  Additional Comments: Vital signs were monitored before and after the procedure. Patient was prepped and draped in the usual sterile fashion. The correct patient, procedure, and site was verified.   Injection Procedure Details:  Procedure Site One Meds Administered:  Meds ordered this encounter  Medications  . betamethasone acetate-betamethasone sodium phosphate (CELESTONE) injection 12 mg  . tiZANidine (ZANAFLEX) 4 MG capsule    Sig: Take 1 capsule (4 mg total) by mouth at bedtime.    Dispense:  30 capsule    Refill:  0    Laterality: Right  Location/Site:  L2-L3  Needle size: 14 G  Needle type: Spinal  Needle Placement: Transforaminal  Findings:    -Comments: Excellent flow of contrast along the nerve and into the epidural space.  Procedure Details: After squaring off the end-plates to get a true AP view, the C-arm was positioned so that an oblique view of the foramen as noted above was visualized. The target area is just inferior to the "nose of the scotty dog" or sub pedicular. The soft tissues overlying this structure were infiltrated with 2-3 ml. of 1% Lidocaine without Epinephrine.  The spinal needle was inserted toward the target using a "trajectory" view along the fluoroscope beam.  Under AP and lateral visualization, the needle was advanced so it did not puncture dura and was located close the 6 O'Clock position of the pedical in AP tracterory. Biplanar projections were used to confirm position. Aspiration was confirmed to be negative for CSF and/or blood. A 1-2 ml. volume of Isovue-250 was injected and flow of contrast was noted at each  level. Radiographs were obtained for documentation purposes.   After attaining the desired flow of contrast documented above, a 0.5 to 1.0 ml test dose of 0.25% Marcaine was injected into each respective transforaminal space.  The patient was observed for 90 seconds post injection.  After no sensory deficits were reported, and normal lower extremity motor function was noted,   the above injectate was administered so that equal amounts of the injectate were placed at each foramen (level) into the transforaminal epidural space.   Additional Comments:  The patient tolerated the procedure well Dressing: 2 x 2 sterile gauze and Band-Aid    Post-procedure details: Patient was observed during the procedure. Post-procedure instructions were reviewed.  Patient left the clinic in stable condition.

## 2019-05-27 NOTE — Progress Notes (Signed)
Jessica Reilly - 58 y.o. female MRN [id number]  Date of birth: 1961/02/03  Office Visit Note: Visit Date: 04/23/2019 PCP: Sharilyn Sites, MD Referred by: Sharilyn Sites, MD  Subjective: Chief Complaint  Patient presents with  . Lower Back - Pain   HPI:  Jessica Reilly is a 58 y.o. female who comes in today For planned right L3 transforaminal epidural steroid injection.  Brief history is that we have completed injection in the past at the L3-4 level from an interlaminar approach below the level of stenosis with decent relief back in January.  On repeat injection in June she just did not get any relief at all.  Her pain is mostly right-sided pain that radiates into the right buttock region without really pain down the leg.  She has a severe amount of pain at times with walking and standing.  She reports 10 out of 10 pain.  She does have a significant anxiety issue underlying the problem as well.  She does take chronic Xanax.  She does not feel like the muscle relaxer methocarbamol is helped her very much.  She has difficulty sleeping at night.  I am going to prescribe tizanidine and we are going to complete a right L2 transforaminal injection today at the level of stenosis.  If she does not get much relief with that her best course of action may be spine surgery evaluation just to see what her options are and potentially referral to more of a chronic pain medication type management situation.  Exam is nonfocal.  ROS Otherwise per HPI.  Assessment & Plan: Visit Diagnoses:  1. Lumbar radiculopathy     Plan: No additional findings.   Meds & Orders:  Meds ordered this encounter  Medications  . betamethasone acetate-betamethasone sodium phosphate (CELESTONE) injection 12 mg  . tiZANidine (ZANAFLEX) 4 MG capsule    Sig: Take 1 capsule (4 mg total) by mouth at bedtime.    Dispense:  30 capsule    Refill:  0    Orders Placed This Encounter  Procedures  . XR C-ARM NO REPORT  . Epidural  Steroid injection    Follow-up: Return if symptoms worsen or fail to improve.   Procedures: No procedures performed  Lumbosacral Transforaminal Epidural Steroid Injection - Sub-Pedicular Approach with Fluoroscopic Guidance  Patient: Jessica Reilly      Date of Birth: 1961-05-30 MRN: [id number] PCP: Sharilyn Sites, MD      Visit Date: 04/23/2019   Universal Protocol:    Date/Time: 04/23/2019  Consent Given By: the patient  Position: PRONE  Additional Comments: Vital signs were monitored before and after the procedure. Patient was prepped and draped in the usual sterile fashion. The correct patient, procedure, and site was verified.   Injection Procedure Details:  Procedure Site One Meds Administered:  Meds ordered this encounter  Medications  . betamethasone acetate-betamethasone sodium phosphate (CELESTONE) injection 12 mg  . tiZANidine (ZANAFLEX) 4 MG capsule    Sig: Take 1 capsule (4 mg total) by mouth at bedtime.    Dispense:  30 capsule    Refill:  0    Laterality: Right  Location/Site:  L2-L3  Needle size: 16 G  Needle type: Spinal  Needle Placement: Transforaminal  Findings:    -Comments: Excellent flow of contrast along the nerve and into the epidural space.  Procedure Details: After squaring off the end-plates to get a true AP view, the C-arm was positioned so that an oblique view of the foramen  as noted above was visualized. The target area is just inferior to the "nose of the scotty dog" or sub pedicular. The soft tissues overlying this structure were infiltrated with 2-3 ml. of 1% Lidocaine without Epinephrine.  The spinal needle was inserted toward the target using a "trajectory" view along the fluoroscope beam.  Under AP and lateral visualization, the needle was advanced so it did not puncture dura and was located close the 6 O'Clock position of the pedical in AP tracterory. Biplanar projections were used to confirm position. Aspiration was  confirmed to be negative for CSF and/or blood. A 1-2 ml. volume of Isovue-250 was injected and flow of contrast was noted at each level. Radiographs were obtained for documentation purposes.   After attaining the desired flow of contrast documented above, a 0.5 to 1.0 ml test dose of 0.25% Marcaine was injected into each respective transforaminal space.  The patient was observed for 90 seconds post injection.  After no sensory deficits were reported, and normal lower extremity motor function was noted,   the above injectate was administered so that equal amounts of the injectate were placed at each foramen (level) into the transforaminal epidural space.   Additional Comments:  The patient tolerated the procedure well Dressing: 2 x 2 sterile gauze and Band-Aid    Post-procedure details: Patient was observed during the procedure. Post-procedure instructions were reviewed.  Patient left the clinic in stable condition.     Clinical History: MRI LUMBAR SPINE WITHOUT CONTRAST  TECHNIQUE: Multiplanar, multisequence MR imaging of the lumbar spine was performed. No intravenous contrast was administered.  COMPARISON:  CT abdomen 07/23/2012  FINDINGS: Segmentation:  5 lumbar type vertebral bodies.  Alignment: Curvature convex to the left with the apex at L1-2 and convex to the right with the apex at L5. 2 mm retrolisthesis at L2-3.  Vertebrae: No fracture or primary bone lesion. Discogenic marrow changes affecting the L1 and L2 vertebral bodies, more on the right.  Conus medullaris and cauda equina: Conus extends to the L1 level. Conus and cauda equina appear normal.  Paraspinal and other soft tissues: Negative  Disc levels:  T12-L1: Mild noncompressive disc bulge.  Mild facet hypertrophy.  L1-2: Disc degeneration worse on the right with endplate osteophytes and bulging of the disc. Discogenic marrow change on the right. Facet degeneration right more than left. Right  foraminal narrowing that could affect the right L1 nerve.  L2-3: 2 mm retrolisthesis. Disc degeneration with endplate osteophytes and shallow protrusion of the disc more towards the right. Facet and ligamentous hypertrophy. Spinal stenosis at this level which could cause neural compression on either or both sides. Canal stenosis is worse on the right. Foraminal stenosis on the right could affect the right L2 nerve.  L3-4: Mild bulging of the disc. Facet and ligamentous hypertrophy. Mild lateral recess narrowing without visible neural compression.  L4-5: Mild bulging of the disc. Bilateral facet and ligamentous hypertrophy. Narrowing of the lateral recesses and foramina left more than right. Definite neural compression is not established however.  L5-S1: Mild bulging of the disc. Facet osteoarthritis left more than right. Mild narrowing of the subarticular lateral recess and intervertebral foramen on the left but without likely neural compression.  IMPRESSION: Curvature convex to the left with the apex at L1-2. Right-sided canal and foraminal stenosis worse at L2-3 than at L1-2 with potential for neural compression, particularly at the L2-3 level. Right-sided discogenic marrow changes at L1-2 could contribute to regional back pain.   Electronically Signed  By: Nelson Chimes M.D.   On: 10/16/2018 14:33  LUMBAR SPINE - COMPLETE WITH BENDING VIEWS - 7 views total  COMPARISON: None.  FINDINGS: There is 15 degrees of levoconvex thoracolumbar scoliosis as measured between T11 and L4. Small ribs are present at the L1 level. Calcifications project over the right renal shadow.  Endplate sclerosis and loss of disc height noted at L1-2 and L2-3. Facet arthropathy noted on the left at L5-S1 and L4-5. There is a suggestion of up to 3 mm retrolisthesis at L3-4 and L2-3. These do not appreciably change with flexion or extension. No abnormal motion on bending views.   IMPRESSION: 1. 3 mm retrolisthesis at L3-4 and L2-3, not changing with flexion or extension. No abnormal motion with flexion or extension. 2. 15 degrees of levoconvex thoracolumbar scoliosis. 3. Degenerative disc disease and endplate sclerosis at U0-2 and L2-3. Lower lumbar facet arthropathy, left greater than right. 4. Suspected right nephrolithiasis.   Electronically Signed By: Sherryl Barters M.D. On: 01/23/2014 14:28     Objective:  VS:  HT:    WT:   BMI:     BP:132/68  HR:63bpm  TEMP: ( )  RESP:  Physical Exam  Ortho Exam Imaging: No results found.

## 2019-05-29 DIAGNOSIS — Z7901 Long term (current) use of anticoagulants: Secondary | ICD-10-CM | POA: Diagnosis not present

## 2019-05-29 DIAGNOSIS — M722 Plantar fascial fibromatosis: Secondary | ICD-10-CM | POA: Diagnosis not present

## 2019-05-29 DIAGNOSIS — M170 Bilateral primary osteoarthritis of knee: Secondary | ICD-10-CM | POA: Diagnosis not present

## 2019-05-29 DIAGNOSIS — D6851 Activated protein C resistance: Secondary | ICD-10-CM | POA: Diagnosis not present

## 2019-05-29 LAB — POCT INR: INR: 1.9 — AB (ref 2.0–3.0)

## 2019-05-30 ENCOUNTER — Telehealth (INDEPENDENT_AMBULATORY_CARE_PROVIDER_SITE_OTHER): Payer: PPO | Admitting: Pharmacist

## 2019-05-30 DIAGNOSIS — Z5181 Encounter for therapeutic drug level monitoring: Secondary | ICD-10-CM

## 2019-05-30 DIAGNOSIS — D689 Coagulation defect, unspecified: Secondary | ICD-10-CM

## 2019-05-30 DIAGNOSIS — D6851 Activated protein C resistance: Secondary | ICD-10-CM

## 2019-05-30 DIAGNOSIS — Z7901 Long term (current) use of anticoagulants: Secondary | ICD-10-CM

## 2019-05-30 DIAGNOSIS — I82629 Acute embolism and thrombosis of deep veins of unspecified upper extremity: Secondary | ICD-10-CM

## 2019-05-30 DIAGNOSIS — Z86718 Personal history of other venous thrombosis and embolism: Secondary | ICD-10-CM

## 2019-05-30 NOTE — Progress Notes (Signed)
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 Patient has a home INR meter. Identity was verified using date of birth and address.  Indication: DVThistory, Factor V Leiden mutation Duration: indefinite Supervising physician:Vinay Gudena  Anticoagulation Clinic Visit History: Patientdoes notreport signs/symptoms of bleeding or thromboembolism or other changes. She does state she received a steroid injection for plantar fascitis.  Anticoagulation Episode Summary    Current INR goal:  2.0-3.0  TTR:  72.8 % (7.1 y)  Next INR check:  06/06/2019  INR from last check:  1.9 (05/30/2019)  Weekly max warfarin dose:    Target end date:  04/11/2015  INR check location:    Preferred lab:    Send INR reminders to:  [facility]   Indications   DVT of upper extremity (deep vein thrombosis) ([place]) [I82.629] Factor V Leiden mutation ([place]) [D68.51] Coagulopathy ([place]) [D68.9] Chronic anticoagulation [Z79.01]       Comments:        Anticoagulation Care Providers    Provider Role Specialty Phone number   Nicholas Lose, MD Responsible Hematology and Oncology [phone]      Allergies  Allergen Reactions  . Amoxicillin-Pot Clavulanate Rash  . Ceftin Rash  . Cefuroxime Rash  . Cefuroxime Axetil Rash  . Risperidone And Related Other (See Comments)    Has medical contraindications    Current Outpatient Medications:  .  acetaminophen (TYLENOL) 500 MG tablet, Take 1,000 mg by mouth as needed., Disp: , Rfl:  .  alprazolam (XANAX) 2 MG tablet, Take 2 mg by mouth 2 (two) times daily as needed (May take a 1 mg during the day but always takes 2 mg at bedtime.). For anxiety, Disp: , Rfl:  .  anastrozole (ARIMIDEX) 1 MG tablet, Take 1 tablet (1 mg total) by mouth daily., Disp: 90 tablet, Rfl: 3 .  Calcium Carbonate-Vit D-Min (CALCIUM 600+D3 PLUS MINERALS) 600-800 MG-UNIT TABS, Take by mouth., Disp: , Rfl:  .   cyanocobalamin (,VITAMIN B-12,) 1000 MCG/ML injection, Inject 100 mcg into the muscle every 30 (thirty) days. , Disp: , Rfl:  .  docusate sodium (STOOL SOFTENER) 100 MG capsule, Take 2 capsules (200 mg total) by mouth at bedtime., Disp: 10 capsule, Rfl: 0 .  esomeprazole (NEXIUM) 40 MG capsule, Take 40 mg by mouth 2 (two) times daily before a meal., Disp: , Rfl:  .  fluticasone (FLONASE) 50 MCG/ACT nasal spray, Place 2 sprays into the nose daily. , Disp: , Rfl:  .  ibuprofen (ADVIL,MOTRIN) 800 MG tablet, Take 800 mg by mouth as needed. , Disp: , Rfl:  .  levocetirizine (XYZAL) 5 MG tablet, TAKE ONE TABLET BY MOUTH AT BEDTIME., Disp: 30 tablet, Rfl: 3 .  lidocaine (LIDODERM) 5 %, Place 1 patch onto the skin as needed. Remove & Discard patch within 12 hours or as directed by MD (only uses prn), Disp: , Rfl:  .  methocarbamol (ROBAXIN) 500 MG tablet, Take 1 tablet (500 mg total) by mouth every 6 (six) hours as needed for muscle spasms., Disp: 90 tablet, Rfl: 1 .  metoprolol (TOPROL-XL) 100 MG 24 hr tablet, Take 100 mg by mouth daily.  , Disp: , Rfl:  .  omega-3 fish oil (MAXEPA) 1000 MG CAPS capsule, Take 1 capsule (1,000 mg total) by mouth 2 (two) times daily., Disp: 180 each, Rfl: 3 .  promethazine (PHENERGAN) 25 MG tablet, Take 25 mg by mouth as needed for nausea. , Disp: , Rfl:  .  simvastatin (ZOCOR) 20 MG tablet, Take 20 mg by mouth daily. , Disp: , Rfl:  .  tiZANidine (ZANAFLEX) 4 MG capsule, Take 1 capsule (4 mg total) by mouth at bedtime., Disp: 30 capsule, Rfl: 0 .  warfarin (COUMADIN) 7.5 MG tablet, Take 1 tablet (7.5 mg total) by mouth daily. Except 1/2 tablet on Tue/Thur, Disp: 32 tablet, Rfl: 6 Past Medical History:  Diagnosis Date  . Abnormal Pap smear   . Acid reflux   . Bipolar disorder ([place]) 03/22/2013  . Breast cancer ([place])   . Breast disorder    cancer  . Chronic anticoagulation 03/22/2013  . Coagulopathy ([place]) 04/10/2012  . Constipation   . DVT of upper extremity (deep vein  thrombosis) ([place]) 04/10/2012  . Factor V Leiden ([place])   . Factor V Leiden mutation ([place]) 04/10/2012  . Family history of ovarian cancer   . Fatty liver 06/04/2017  . Fibroid   . Gallstones   . Heart murmur   . High cholesterol   . History of abnormal cervical Pap smear 01/06/2015  . History of breast cancer 01/06/2015  . HTN (hypertension)   . HX: breast cancer   . Hypertension 12/31/2012  . Kidney stones   . Kidney stones 06/04/2017  . Lobular carcinoma of breast, estrogen receptor positive, 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  . Mental disorder    panic attacks  . Osteoarthritis   . Pain in both knees   . Vaginal Pap smear, abnormal    Social History   Socioeconomic History  . Marital status: Divorced    Spouse name: Not on file  . Number of children: Not on file  . Years of education: Not on file  . Highest education level: Not on file  Occupational History  . Not on file  Social Needs  . Financial resource strain: Not on file  . Food insecurity    Worry: Not on file    Inability: Not on file  . Transportation needs    Medical: Not on file    Non-medical: Not on file  Tobacco Use  . Smoking status: Never Smoker  . Smokeless tobacco: Never Used  Substance and Sexual Activity  . Alcohol use: Yes    Alcohol/week: 0.0 standard drinks    Comment: Rarely.  . Drug use: No  . Sexual activity: Never    Birth control/protection: Surgical    Comment: partial hyst  Lifestyle  . Physical activity    Days per week: Not on file    Minutes per session: Not on file  . Stress: Not on file  Relationships  . Social Herbalist on phone: Not on file    Gets together: Not on file    Attends religious service: Not on file    Active member of club or organization: Not on file    Attends meetings of clubs or organizations: Not on file    Relationship status: Not on file  Other Topics Concern  . Not on file  Social History Narrative  . Not on file   Family History  Problem Relation Age  of Onset  . Alcohol abuse Mother   . COPD Mother   . Alcohol abuse Father   . Heart disease Father   . Pneumonia Father   . Diabetes Father   . Colon cancer Father        Dx 37s; deceased 47s  . OCD Daughter   . Bipolar disorder Daughter   . Hypertension Daughter   .  Schizophrenia Daughter   . Schizophrenia Daughter   . Bipolar disorder Daughter   . OCD Daughter   . Hypertension Daughter   . COPD Daughter   . Other Daughter        stomach don't empty out  . ADD / ADHD Grandchild   . Anxiety disorder Grandchild   . Depression Grandchild   . Healthy Sister   . Cancer Brother        leukemia 17s; deceased 41  . Heart disease Brother   . Cancer Brother        liver; currently 65  . Cirrhosis Brother   . Other Brother        waiting for liver transplant  . Alzheimer's disease Paternal Grandmother   . Ovarian cancer Maternal Grandmother 47       deceased 94  . Cancer Maternal Uncle        3 of 6 with various cancers: skin, NHL, Hodkin's Dx  . Breast cancer Cousin 35       mat first cousin related through unaffected aunt   ASSESSMENT Recent Results: Lab Results  Component Value Date   INR 1.9 (A) 05/30/2019   INR 2.4 04/16/2019   INR 2.7 03/24/2019   PROTIME 27.6 (H) 07/30/2013   Anticoagulation Dosing: Description   DR Geralyn Flash PATIENT     INR today: Subtherapeutic slightly  PLAN Weekly dose was unchanged considering recent steroid injection and INR only slightly subtherapeutic.  Patient advised to contact clinic or seek medical attention if signs/symptoms of bleeding or thromboembolism occur.  Patient verbalized understanding by repeating back information and was advised to contact me if further medication-related questions arise. Patient was also provided an information handout.  Follow-up 1-2 weeks  [name]

## 2019-06-10 ENCOUNTER — Telehealth (INDEPENDENT_AMBULATORY_CARE_PROVIDER_SITE_OTHER): Payer: PPO | Admitting: Pharmacist

## 2019-06-10 DIAGNOSIS — Z7901 Long term (current) use of anticoagulants: Secondary | ICD-10-CM | POA: Diagnosis not present

## 2019-06-10 DIAGNOSIS — D689 Coagulation defect, unspecified: Secondary | ICD-10-CM

## 2019-06-10 DIAGNOSIS — D6851 Activated protein C resistance: Secondary | ICD-10-CM | POA: Diagnosis not present

## 2019-06-10 DIAGNOSIS — Z5181 Encounter for therapeutic drug level monitoring: Secondary | ICD-10-CM | POA: Diagnosis not present

## 2019-06-10 DIAGNOSIS — I82623 Acute embolism and thrombosis of deep veins of upper extremity, bilateral: Secondary | ICD-10-CM

## 2019-06-10 DIAGNOSIS — Z86718 Personal history of other venous thrombosis and embolism: Secondary | ICD-10-CM

## 2019-06-10 DIAGNOSIS — I82629 Acute embolism and thrombosis of deep veins of unspecified upper extremity: Secondary | ICD-10-CM

## 2019-06-10 DIAGNOSIS — I82622 Acute embolism and thrombosis of deep veins of left upper extremity: Secondary | ICD-10-CM

## 2019-06-10 LAB — POCT INR: INR: 2.4 (ref 2.0–3.0)

## 2019-06-10 MED ORDER — WARFARIN SODIUM 7.5 MG PO TABS: 7.5000 mg | ORAL_TABLET | Freq: Every day | ORAL | 3 refills | Status: DC

## 2019-06-10 NOTE — Progress Notes (Signed)
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 Patient has a home INR meter. Identity was verified using date of birth and address.  Indication: DVThistory, Factor V Leiden mutation Duration: indefinite Supervising physician:Vinay Gudena  Anticoagulation Clinic Visit History: Patientdoes notreport signs/symptoms of bleeding or thromboembolism or other changes.  Anticoagulation Episode Summary    Current INR goal:  2.0-3.0  TTR:  72.8 % (7.1 y)  Next INR check:  06/24/2019  INR from last check:  2.4 (06/10/2019)  Weekly max warfarin dose:    Target end date:  04/11/2015  INR check location:    Preferred lab:    Send INR reminders to:  [facility]   Indications   DVT of upper extremity (deep vein thrombosis) (HCC) [I82.629] Factor V Leiden mutation ([place]) [D68.51] Coagulopathy ([place]) [D68.9] Chronic anticoagulation [Z79.01]       Comments:        Anticoagulation Care Providers    Provider Role Specialty Phone number   Nicholas Lose, MD Responsible Hematology and Oncology [phone]     Allergies  Allergen Reactions  . Amoxicillin-Pot Clavulanate Rash  . Ceftin Rash  . Cefuroxime Rash  . Cefuroxime Axetil Rash  . Risperidone And Related Other (See Comments)    Has medical contraindications    Current Outpatient Medications:  .  acetaminophen (TYLENOL) 500 MG tablet, Take 1,000 mg by mouth as needed., Disp: , Rfl:  .  alprazolam (XANAX) 2 MG tablet, Take 2 mg by mouth 2 (two) times daily as needed (May take a 1 mg during the day but always takes 2 mg at bedtime.). For anxiety, Disp: , Rfl:  .  anastrozole (ARIMIDEX) 1 MG tablet, Take 1 tablet (1 mg total) by mouth daily., Disp: 90 tablet, Rfl: 3 .  Calcium Carbonate-Vit D-Min (CALCIUM 600+D3 PLUS MINERALS) 600-800 MG-UNIT TABS, Take by mouth., Disp: , Rfl:  .  cyanocobalamin (,VITAMIN B-12,) 1000 MCG/ML injection, Inject 100 mcg into  the muscle every 30 (thirty) days. , Disp: , Rfl:  .  docusate sodium (STOOL SOFTENER) 100 MG capsule, Take 2 capsules (200 mg total) by mouth at bedtime., Disp: 10 capsule, Rfl: 0 .  esomeprazole (NEXIUM) 40 MG capsule, Take 40 mg by mouth 2 (two) times daily before a meal., Disp: , Rfl:  .  fluticasone (FLONASE) 50 MCG/ACT nasal spray, Place 2 sprays into the nose daily. , Disp: , Rfl:  .  ibuprofen (ADVIL,MOTRIN) 800 MG tablet, Take 800 mg by mouth as needed. , Disp: , Rfl:  .  levocetirizine (XYZAL) 5 MG tablet, TAKE ONE TABLET BY MOUTH AT BEDTIME., Disp: 30 tablet, Rfl: 3 .  lidocaine (LIDODERM) 5 %, Place 1 patch onto the skin as needed. Remove & Discard patch within 12 hours or as directed by MD (only uses prn), Disp: , Rfl:  .  methocarbamol (ROBAXIN) 500 MG tablet, Take 1 tablet (500 mg total) by mouth every 6 (six) hours as needed for muscle spasms., Disp: 90 tablet, Rfl: 1 .  metoprolol (TOPROL-XL) 100 MG 24 hr tablet, Take 100 mg by mouth daily.  , Disp: , Rfl:  .  omega-3 fish oil (MAXEPA) 1000 MG CAPS capsule, Take 1 capsule (1,000 mg total) by mouth 2 (two) times daily., Disp: 180 each, Rfl: 3 .  promethazine (PHENERGAN) 25 MG tablet, Take 25 mg by mouth as needed for nausea. , Disp: , Rfl:  .  simvastatin (ZOCOR) 20 MG tablet, Take 20 mg by mouth daily. ,  Disp: , Rfl:  .  tiZANidine (ZANAFLEX) 4 MG capsule, Take 1 capsule (4 mg total) by mouth at bedtime., Disp: 30 capsule, Rfl: 0 .  warfarin (COUMADIN) 7.5 MG tablet, Take 1 tablet (7.5 mg total) by mouth daily. Except 1/2 tablet on Tue/Thur, Disp: 32 tablet, Rfl: 6 Past Medical History:  Diagnosis Date  . Abnormal Pap smear   . Acid reflux   . Bipolar disorder ([place]) 03/22/2013  . Breast cancer ([place])   . Breast disorder    cancer  . Chronic anticoagulation 03/22/2013  . Coagulopathy ([place]) 04/10/2012  . Constipation   . DVT of upper extremity (deep vein thrombosis) ([place]) 04/10/2012  . Factor V Leiden ([place])   . Factor V Leiden  mutation ([place]) 04/10/2012  . Family history of ovarian cancer   . Fatty liver 06/04/2017  . Fibroid   . Gallstones   . Heart murmur   . High cholesterol   . History of abnormal cervical Pap smear 01/06/2015  . History of breast cancer 01/06/2015  . HTN (hypertension)   . HX: breast cancer   . Hypertension 12/31/2012  . Kidney stones   . Kidney stones 06/04/2017  . Lobular carcinoma of breast, estrogen receptor positive, 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  . Mental disorder    panic attacks  . Osteoarthritis   . Pain in both knees   . Vaginal Pap smear, abnormal    Social History   Socioeconomic History  . Marital status: Divorced    Spouse name: Not on file  . Number of children: Not on file  . Years of education: Not on file  . Highest education level: Not on file  Occupational History  . Not on file  Social Needs  . Financial resource strain: Not on file  . Food insecurity    Worry: Not on file    Inability: Not on file  . Transportation needs    Medical: Not on file    Non-medical: Not on file  Tobacco Use  . Smoking status: Never Smoker  . Smokeless tobacco: Never Used  Substance and Sexual Activity  . Alcohol use: Yes    Alcohol/week: 0.0 standard drinks    Comment: Rarely.  . Drug use: No  . Sexual activity: Never    Birth control/protection: Surgical    Comment: partial hyst  Lifestyle  . Physical activity    Days per week: Not on file    Minutes per session: Not on file  . Stress: Not on file  Relationships  . Social Herbalist on phone: Not on file    Gets together: Not on file    Attends religious service: Not on file    Active member of club or organization: Not on file    Attends meetings of clubs or organizations: Not on file    Relationship status: Not on file  Other Topics Concern  . Not on file  Social History Narrative  . Not on file   Family History  Problem Relation Age of Onset  . Alcohol abuse Mother   . COPD Mother   . Alcohol abuse  Father   . Heart disease Father   . Pneumonia Father   . Diabetes Father   . Colon cancer Father        Dx 78s; deceased 79s  . OCD Daughter   . Bipolar disorder Daughter   . Hypertension Daughter   . Schizophrenia Daughter   . Schizophrenia Daughter   .  Bipolar disorder Daughter   . OCD Daughter   . Hypertension Daughter   . COPD Daughter   . Other Daughter        stomach don't empty out  . ADD / ADHD Grandchild   . Anxiety disorder Grandchild   . Depression Grandchild   . Healthy Sister   . Cancer Brother        leukemia 58s; deceased 76  . Heart disease Brother   . Cancer Brother        liver; currently 61  . Cirrhosis Brother   . Other Brother        waiting for liver transplant  . Alzheimer's disease Paternal Grandmother   . Ovarian cancer Maternal Grandmother 48       deceased 82  . Cancer Maternal Uncle        3 of 6 with various cancers: skin, NHL, Hodkin's Dx  . Breast cancer Cousin 15       mat first cousin related through unaffected aunt   ASSESSMENT Recent Results: Lab Results  Component Value Date   INR 2.4 06/10/2019   INR 1.9 (A) 05/30/2019   INR 2.4 04/16/2019   PROTIME 27.6 (H) 07/30/2013   Anticoagulation Dosing: Description   DR Geralyn Flash PATIENT     INR today: Therapeutic  PLAN Weekly dose was unchanged, refills sent  There are no Patient Instructions on file for this visit. Patient advised to contact clinic or seek medical attention if signs/symptoms of bleeding or thromboembolism occur.  Patient verbalized understanding by repeating back information and was advised to contact me if further medication-related questions arise. Patient was also provided an information handout.  Follow-up 2 weeks  [name]

## 2019-06-17 DIAGNOSIS — M797 Fibromyalgia: Secondary | ICD-10-CM | POA: Diagnosis not present

## 2019-06-17 DIAGNOSIS — F419 Anxiety disorder, unspecified: Secondary | ICD-10-CM | POA: Diagnosis not present

## 2019-06-26 DIAGNOSIS — H43813 Vitreous degeneration, bilateral: Secondary | ICD-10-CM | POA: Diagnosis not present

## 2019-06-26 DIAGNOSIS — H04123 Dry eye syndrome of bilateral lacrimal glands: Secondary | ICD-10-CM | POA: Diagnosis not present

## 2019-06-26 DIAGNOSIS — H25812 Combined forms of age-related cataract, left eye: Secondary | ICD-10-CM | POA: Diagnosis not present

## 2019-06-30 ENCOUNTER — Telehealth (INDEPENDENT_AMBULATORY_CARE_PROVIDER_SITE_OTHER): Payer: PPO | Admitting: Pharmacist

## 2019-06-30 DIAGNOSIS — D6851 Activated protein C resistance: Secondary | ICD-10-CM

## 2019-06-30 DIAGNOSIS — Z7901 Long term (current) use of anticoagulants: Secondary | ICD-10-CM | POA: Diagnosis not present

## 2019-06-30 DIAGNOSIS — D689 Coagulation defect, unspecified: Secondary | ICD-10-CM

## 2019-06-30 DIAGNOSIS — I82629 Acute embolism and thrombosis of deep veins of unspecified upper extremity: Secondary | ICD-10-CM | POA: Diagnosis not present

## 2019-06-30 LAB — POCT INR: INR: 2.2 (ref 2.0–3.0)

## 2019-06-30 NOTE — Progress Notes (Signed)
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 Patient has a home INR meter. Identity was verified using date of birth and address.  Indication: DVThistory, Factor V Leiden mutation Duration: indefinite Supervising physician:Vinay Gudena  Anticoagulation Clinic Visit History: Patientdoes notreport signs/symptoms of bleeding or thromboembolism or other changes.  Anticoagulation Episode Summary    Current INR goal:  2.0-3.0  TTR:  73.0 % (7.2 y)  Next INR check:  07/14/2019  INR from last check:  2.2 (06/30/2019)  Weekly max warfarin dose:    Target end date:  04/11/2015  INR check location:    Preferred lab:    Send INR reminders to:  [facility]   Indications   DVT of upper extremity (deep vein thrombosis) (HCC) [I82.629] Factor V Leiden mutation ([place]) [D68.51] Coagulopathy ([place]) [D68.9] Chronic anticoagulation [Z79.01]       Comments:        Anticoagulation Care Providers    Provider Role Specialty Phone number   Nicholas Lose, MD Responsible Hematology and Oncology [phone]     Allergies  Allergen Reactions  . Amoxicillin-Pot Clavulanate Rash  . Ceftin Rash  . Cefuroxime Rash  . Cefuroxime Axetil Rash  . Risperidone And Related Other (See Comments)    Has medical contraindications    Current Outpatient Medications:  .  acetaminophen (TYLENOL) 500 MG tablet, Take 1,000 mg by mouth as needed., Disp: , Rfl:  .  alprazolam (XANAX) 2 MG tablet, Take 2 mg by mouth 2 (two) times daily as needed (May take a 1 mg during the day but always takes 2 mg at bedtime.). For anxiety, Disp: , Rfl:  .  anastrozole (ARIMIDEX) 1 MG tablet, Take 1 tablet (1 mg total) by mouth daily., Disp: 90 tablet, Rfl: 3 .  Calcium Carbonate-Vit D-Min (CALCIUM 600+D3 PLUS MINERALS) 600-800 MG-UNIT TABS, Take by mouth., Disp: , Rfl:  .  cyanocobalamin (,VITAMIN B-12,) 1000 MCG/ML injection, Inject 100 mcg into  the muscle every 30 (thirty) days. , Disp: , Rfl:  .  docusate sodium (STOOL SOFTENER) 100 MG capsule, Take 2 capsules (200 mg total) by mouth at bedtime., Disp: 10 capsule, Rfl: 0 .  esomeprazole (NEXIUM) 40 MG capsule, Take 40 mg by mouth 2 (two) times daily before a meal., Disp: , Rfl:  .  fluticasone (FLONASE) 50 MCG/ACT nasal spray, Place 2 sprays into the nose daily. , Disp: , Rfl:  .  ibuprofen (ADVIL,MOTRIN) 800 MG tablet, Take 800 mg by mouth as needed. , Disp: , Rfl:  .  levocetirizine (XYZAL) 5 MG tablet, TAKE ONE TABLET BY MOUTH AT BEDTIME., Disp: 30 tablet, Rfl: 3 .  lidocaine (LIDODERM) 5 %, Place 1 patch onto the skin as needed. Remove & Discard patch within 12 hours or as directed by MD (only uses prn), Disp: , Rfl:  .  methocarbamol (ROBAXIN) 500 MG tablet, Take 1 tablet (500 mg total) by mouth every 6 (six) hours as needed for muscle spasms., Disp: 90 tablet, Rfl: 1 .  metoprolol (TOPROL-XL) 100 MG 24 hr tablet, Take 100 mg by mouth daily.  , Disp: , Rfl:  .  omega-3 fish oil (MAXEPA) 1000 MG CAPS capsule, Take 1 capsule (1,000 mg total) by mouth 2 (two) times daily., Disp: 180 each, Rfl: 3 .  promethazine (PHENERGAN) 25 MG tablet, Take 25 mg by mouth as needed for nausea. , Disp: , Rfl:  .  simvastatin (ZOCOR) 20 MG tablet, Take 20 mg by mouth daily. ,  Disp: , Rfl:  .  tiZANidine (ZANAFLEX) 4 MG capsule, Take 1 capsule (4 mg total) by mouth at bedtime., Disp: 30 capsule, Rfl: 0 .  warfarin (COUMADIN) 7.5 MG tablet, Take 1 tablet (7.5 mg total) by mouth daily. Except 1/2 tablet on Tue/Thur, Disp: 90 tablet, Rfl: 3 Past Medical History:  Diagnosis Date  . Abnormal Pap smear   . Acid reflux   . Bipolar disorder ([place]) 03/22/2013  . Breast cancer ([place])   . Breast disorder    cancer  . Chronic anticoagulation 03/22/2013  . Coagulopathy ([place]) 04/10/2012  . Constipation   . DVT of upper extremity (deep vein thrombosis) ([place]) 04/10/2012  . Factor V Leiden ([place])   . Factor V Leiden  mutation ([place]) 04/10/2012  . Family history of ovarian cancer   . Fatty liver 06/04/2017  . Fibroid   . Gallstones   . Heart murmur   . High cholesterol   . History of abnormal cervical Pap smear 01/06/2015  . History of breast cancer 01/06/2015  . HTN (hypertension)   . HX: breast cancer   . Hypertension 12/31/2012  . Kidney stones   . Kidney stones 06/04/2017  . Lobular carcinoma of breast, estrogen receptor positive, 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  . Mental disorder    panic attacks  . Osteoarthritis   . Pain in both knees   . Vaginal Pap smear, abnormal    Social History   Socioeconomic History  . Marital status: Divorced    Spouse name: Not on file  . Number of children: Not on file  . Years of education: Not on file  . Highest education level: Not on file  Occupational History  . Not on file  Social Needs  . Financial resource strain: Not on file  . Food insecurity    Worry: Not on file    Inability: Not on file  . Transportation needs    Medical: Not on file    Non-medical: Not on file  Tobacco Use  . Smoking status: Never Smoker  . Smokeless tobacco: Never Used  Substance and Sexual Activity  . Alcohol use: Yes    Alcohol/week: 0.0 standard drinks    Comment: Rarely.  . Drug use: No  . Sexual activity: Never    Birth control/protection: Surgical    Comment: partial hyst  Lifestyle  . Physical activity    Days per week: Not on file    Minutes per session: Not on file  . Stress: Not on file  Relationships  . Social Herbalist on phone: Not on file    Gets together: Not on file    Attends religious service: Not on file    Active member of club or organization: Not on file    Attends meetings of clubs or organizations: Not on file    Relationship status: Not on file  Other Topics Concern  . Not on file  Social History Narrative  . Not on file   Family History  Problem Relation Age of Onset  . Alcohol abuse Mother   . COPD Mother   . Alcohol abuse  Father   . Heart disease Father   . Pneumonia Father   . Diabetes Father   . Colon cancer Father        Dx 84s; deceased 53s  . OCD Daughter   . Bipolar disorder Daughter   . Hypertension Daughter   . Schizophrenia Daughter   . Schizophrenia Daughter   .  Bipolar disorder Daughter   . OCD Daughter   . Hypertension Daughter   . COPD Daughter   . Other Daughter        stomach don't empty out  . ADD / ADHD Grandchild   . Anxiety disorder Grandchild   . Depression Grandchild   . Healthy Sister   . Cancer Brother        leukemia 73s; deceased 36  . Heart disease Brother   . Cancer Brother        liver; currently 24  . Cirrhosis Brother   . Other Brother        waiting for liver transplant  . Alzheimer's disease Paternal Grandmother   . Ovarian cancer Maternal Grandmother 38       deceased 59  . Cancer Maternal Uncle        3 of 6 with various cancers: skin, NHL, Hodkin's Dx  . Breast cancer Cousin 32       mat first cousin related through unaffected aunt   ASSESSMENT Recent Results: Lab Results  Component Value Date   INR 2.2 06/30/2019   INR 2.4 06/10/2019   INR 1.9 (A) 05/30/2019   PROTIME 27.6 (H) 07/30/2013   Anticoagulation Dosing: Description   DR Geralyn Flash PATIENT     INR today: Therapeutic  PLAN Weekly dose was unchanged   Patient advised to contact clinic or seek medical attention if signs/symptoms of bleeding or thromboembolism occur.  Follow up 2 weeks

## 2019-07-02 DIAGNOSIS — Z08 Encounter for follow-up examination after completed treatment for malignant neoplasm: Secondary | ICD-10-CM | POA: Diagnosis not present

## 2019-07-02 DIAGNOSIS — E669 Obesity, unspecified: Secondary | ICD-10-CM | POA: Diagnosis not present

## 2019-07-02 DIAGNOSIS — Z170 Estrogen receptor positive status [ER+]: Secondary | ICD-10-CM | POA: Diagnosis not present

## 2019-07-02 DIAGNOSIS — F329 Major depressive disorder, single episode, unspecified: Secondary | ICD-10-CM | POA: Diagnosis not present

## 2019-07-02 DIAGNOSIS — Z853 Personal history of malignant neoplasm of breast: Secondary | ICD-10-CM | POA: Diagnosis not present

## 2019-07-02 DIAGNOSIS — Z9221 Personal history of antineoplastic chemotherapy: Secondary | ICD-10-CM | POA: Diagnosis not present

## 2019-07-02 DIAGNOSIS — D6851 Activated protein C resistance: Secondary | ICD-10-CM | POA: Diagnosis not present

## 2019-07-02 DIAGNOSIS — Z9013 Acquired absence of bilateral breasts and nipples: Secondary | ICD-10-CM | POA: Diagnosis not present

## 2019-07-02 DIAGNOSIS — Z7901 Long term (current) use of anticoagulants: Secondary | ICD-10-CM | POA: Diagnosis not present

## 2019-07-02 DIAGNOSIS — Z780 Asymptomatic menopausal state: Secondary | ICD-10-CM | POA: Diagnosis not present

## 2019-07-02 DIAGNOSIS — R079 Chest pain, unspecified: Secondary | ICD-10-CM | POA: Diagnosis not present

## 2019-07-02 DIAGNOSIS — Z79811 Long term (current) use of aromatase inhibitors: Secondary | ICD-10-CM | POA: Diagnosis not present

## 2019-07-02 DIAGNOSIS — C50919 Malignant neoplasm of unspecified site of unspecified female breast: Secondary | ICD-10-CM | POA: Diagnosis not present

## 2019-07-02 DIAGNOSIS — I350 Nonrheumatic aortic (valve) stenosis: Secondary | ICD-10-CM | POA: Diagnosis not present

## 2019-07-02 DIAGNOSIS — C50911 Malignant neoplasm of unspecified site of right female breast: Secondary | ICD-10-CM | POA: Diagnosis not present

## 2019-07-09 DIAGNOSIS — C50911 Malignant neoplasm of unspecified site of right female breast: Secondary | ICD-10-CM | POA: Diagnosis not present

## 2019-07-09 DIAGNOSIS — Z170 Estrogen receptor positive status [ER+]: Secondary | ICD-10-CM | POA: Diagnosis not present

## 2019-07-10 ENCOUNTER — Encounter (INDEPENDENT_AMBULATORY_CARE_PROVIDER_SITE_OTHER): Payer: Self-pay | Admitting: *Deleted

## 2019-07-23 ENCOUNTER — Other Ambulatory Visit: Payer: Self-pay | Admitting: *Deleted

## 2019-07-23 DIAGNOSIS — Z20822 Contact with and (suspected) exposure to covid-19: Secondary | ICD-10-CM

## 2019-07-23 DIAGNOSIS — M170 Bilateral primary osteoarthritis of knee: Secondary | ICD-10-CM | POA: Diagnosis not present

## 2019-07-23 DIAGNOSIS — Z20828 Contact with and (suspected) exposure to other viral communicable diseases: Secondary | ICD-10-CM | POA: Diagnosis not present

## 2019-07-25 ENCOUNTER — Telehealth: Payer: Self-pay | Admitting: General Practice

## 2019-07-25 LAB — NOVEL CORONAVIRUS, NAA: SARS-CoV-2, NAA: NOT DETECTED

## 2019-07-25 NOTE — Telephone Encounter (Signed)
Negative COVID results given. Patient results "NOT Detected." Caller expressed understanding. ° °

## 2019-07-28 DIAGNOSIS — R079 Chest pain, unspecified: Secondary | ICD-10-CM | POA: Diagnosis not present

## 2019-07-30 DIAGNOSIS — M170 Bilateral primary osteoarthritis of knee: Secondary | ICD-10-CM | POA: Diagnosis not present

## 2019-08-06 DIAGNOSIS — M170 Bilateral primary osteoarthritis of knee: Secondary | ICD-10-CM | POA: Diagnosis not present

## 2019-08-12 DIAGNOSIS — C50011 Malignant neoplasm of nipple and areola, right female breast: Secondary | ICD-10-CM | POA: Diagnosis not present

## 2019-08-12 DIAGNOSIS — C50012 Malignant neoplasm of nipple and areola, left female breast: Secondary | ICD-10-CM | POA: Diagnosis not present

## 2019-08-25 DIAGNOSIS — Z7901 Long term (current) use of anticoagulants: Secondary | ICD-10-CM | POA: Diagnosis not present

## 2019-08-25 DIAGNOSIS — D6851 Activated protein C resistance: Secondary | ICD-10-CM | POA: Diagnosis not present

## 2019-10-29 ENCOUNTER — Encounter: Payer: Self-pay | Admitting: Hematology and Oncology

## 2019-10-29 LAB — POCT INR: INR: 2.1 (ref 2.0–3.0)

## 2019-11-09 DIAGNOSIS — E7849 Other hyperlipidemia: Secondary | ICD-10-CM | POA: Diagnosis not present

## 2019-11-09 DIAGNOSIS — M1991 Primary osteoarthritis, unspecified site: Secondary | ICD-10-CM | POA: Diagnosis not present

## 2019-11-09 DIAGNOSIS — I10 Essential (primary) hypertension: Secondary | ICD-10-CM | POA: Diagnosis not present

## 2019-11-19 ENCOUNTER — Encounter: Payer: Self-pay | Admitting: Hematology and Oncology

## 2019-11-19 LAB — POCT INR: INR: 2.3 (ref 2.0–3.0)

## 2019-12-07 DIAGNOSIS — I10 Essential (primary) hypertension: Secondary | ICD-10-CM | POA: Diagnosis not present

## 2019-12-07 DIAGNOSIS — E7849 Other hyperlipidemia: Secondary | ICD-10-CM | POA: Diagnosis not present

## 2019-12-07 DIAGNOSIS — M1991 Primary osteoarthritis, unspecified site: Secondary | ICD-10-CM | POA: Diagnosis not present

## 2019-12-25 ENCOUNTER — Encounter: Payer: Self-pay | Admitting: Hematology and Oncology

## 2019-12-25 LAB — POCT INR

## 2019-12-26 ENCOUNTER — Inpatient Hospital Stay: Payer: PPO | Attending: Hematology and Oncology | Admitting: Hematology and Oncology

## 2019-12-26 NOTE — Assessment & Plan Note (Deleted)
Recurrent DVTs  lab testing was done and she wasfound to be a heterozygotefor the factor V Leiden gene mutation.She was anticoagulated and she has been kept on therapeutic Coumadin indefinitely.  She does her INR checks at home.  She talks to a pharmacist who has been managing her INRs.

## 2019-12-26 NOTE — Assessment & Plan Note (Deleted)
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 Status post bilateral mastectomies at [facility], radiation, and chemotherapy CEF x5 cycles followed by radiation to the right chest  Current treatment: Anastrozole started 2009 Anastrozole toxicities: Denies any hot flashes or myalgias.  Breast cancer surveillance: 1.  Examination: Bilateral mastectomies 2.  No role of imaging studies since she had bilateral mastectomies  Patient wishes to stay on anastrozole indefinitely.   Bone density will need to be done  She understands fully well that there is no data for more than 10 years of antiestrogen therapy.  Return to clinic in 1 year for follow-up

## 2019-12-29 ENCOUNTER — Other Ambulatory Visit: Payer: Self-pay | Admitting: Hematology and Oncology

## 2019-12-29 DIAGNOSIS — H5310 Unspecified subjective visual disturbances: Secondary | ICD-10-CM | POA: Diagnosis not present

## 2019-12-29 DIAGNOSIS — H43813 Vitreous degeneration, bilateral: Secondary | ICD-10-CM | POA: Diagnosis not present

## 2019-12-29 DIAGNOSIS — Z961 Presence of intraocular lens: Secondary | ICD-10-CM | POA: Diagnosis not present

## 2019-12-29 DIAGNOSIS — H25812 Combined forms of age-related cataract, left eye: Secondary | ICD-10-CM | POA: Diagnosis not present

## 2019-12-31 ENCOUNTER — Telehealth: Payer: Self-pay | Admitting: Cardiovascular Disease

## 2019-12-31 NOTE — Telephone Encounter (Signed)
LMOM RE: Scheduling appt--- AF

## 2020-01-02 ENCOUNTER — Telehealth: Payer: Self-pay | Admitting: Cardiovascular Disease

## 2020-01-02 NOTE — Telephone Encounter (Signed)
01/02/20 LMOM RE: 02/04/20 Appt at 9am

## 2020-01-14 DIAGNOSIS — Z170 Estrogen receptor positive status [ER+]: Secondary | ICD-10-CM | POA: Diagnosis not present

## 2020-01-14 DIAGNOSIS — C50911 Malignant neoplasm of unspecified site of right female breast: Secondary | ICD-10-CM | POA: Diagnosis not present

## 2020-01-19 ENCOUNTER — Telehealth: Payer: Self-pay | Admitting: Hematology and Oncology

## 2020-01-19 NOTE — Telephone Encounter (Signed)
Scheduled appt per 4/12 sch message - pt is aware of apt date and time

## 2020-01-20 ENCOUNTER — Inpatient Hospital Stay: Payer: PPO | Attending: Hematology and Oncology | Admitting: Hematology and Oncology

## 2020-01-20 DIAGNOSIS — Z170 Estrogen receptor positive status [ER+]: Secondary | ICD-10-CM

## 2020-01-20 DIAGNOSIS — I82629 Acute embolism and thrombosis of deep veins of unspecified upper extremity: Secondary | ICD-10-CM | POA: Diagnosis not present

## 2020-01-20 DIAGNOSIS — I82623 Acute embolism and thrombosis of deep veins of upper extremity, bilateral: Secondary | ICD-10-CM | POA: Diagnosis not present

## 2020-01-20 DIAGNOSIS — C50411 Malignant neoplasm of upper-outer quadrant of right female breast: Secondary | ICD-10-CM | POA: Diagnosis not present

## 2020-01-20 DIAGNOSIS — I82622 Acute embolism and thrombosis of deep veins of left upper extremity: Secondary | ICD-10-CM

## 2020-01-20 MED ORDER — WARFARIN SODIUM 7.5 MG PO TABS: 7.5000 mg | ORAL_TABLET | Freq: Every day | ORAL | 3 refills | Status: DC

## 2020-01-20 MED ORDER — ANASTROZOLE 1 MG PO TABS: 1.0000 mg | ORAL_TABLET | Freq: Every day | ORAL | 3 refills | Status: DC

## 2020-01-20 NOTE — Assessment & Plan Note (Signed)
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 Status post bilateral mastectomies at [facility], radiation, and chemotherapy CEF x5 cycles followed by radiation to the right chest  Current treatment: Anastrozole started 2009 Anastrozole toxicities: Denies any hot flashes or myalgias.  Breast cancer surveillance: 1.    01/20/2020: Examination: Bilateral mastectomies 2.  No role of imaging studies since she had bilateral mastectomies  Patient wishes to stay on anastrozole indefinitely. Recurrent DVTs  lab testing was done and she wasfound to be a heterozygotefor the factor V Leiden gene mutation.She was anticoagulated and she has been kept on therapeutic Coumadin.  She does her INR checks at home.  She talked to a pharmacist who is been managing her INRs.  Return to clinic in 1 year for follow-up

## 2020-01-20 NOTE — Progress Notes (Signed)
HEMATOLOGY-ONCOLOGY TELEPHONE VISIT PROGRESS NOTE  I connected with Jessica Reilly on 01/20/20 at  3:30 PM EDT by telephone and verified that I am speaking with the correct person using two identifiers.  I discussed the limitations, risks, security and privacy concerns of performing an evaluation and management service by telephone and the availability of in person appointments.  I also discussed with the patient that there may be a patient responsible charge related to this service. The patient expressed understanding and agreed to proceed.   History of Present Illness: 59-year with above-mentioned history of right breast cancer who is currently on antiestrogen therapy with anastrozole.  She underwent bilateral mastectomies radiation at [facility].  She has been on anastrozole for 12 years and appears to be tolerating it extremely well.  She does not want to discontinue this treatment.  She denies any lumps or nodules or any concerns.  Oncology History  Malignant neoplasm of upper-outer quadrant of right breast in female, estrogen receptor positive ([place])  2008 Initial Diagnosis   T2N0, ER PR positive, HER-2 negative, invasive ductal carcinoma of the right breast.    Status post bilateral mastectomies, radiation and antiestrogen therapy since 2009 treatment was done at [facility].   2009 -  Anti-estrogen oral therapy   Anastrozole 1 mg daily.  Plan is to treat her indefinitely.  Patient understands risks and benefits.  There is no data for long-term antiestrogen therapy but she is very anxious to come off of it.    REVIEW OF SYSTEMS:   Constitutional: Denies fevers, chills or abnormal weight loss Eyes: Denies blurriness of vision Ears, nose, mouth, throat, and face: Denies mucositis or sore throat Respiratory: Denies cough, dyspnea or wheezes Cardiovascular: Denies palpitation, chest discomfort Gastrointestinal:  Denies nausea, heartburn or change in bowel habits Skin: Denies  abnormal skin rashes Lymphatics: Denies new lymphadenopathy or easy bruising Neurological:Denies numbness, tingling or new weaknesses Behavioral/Psych: Mood is stable, no new changes  Extremities: No lower extremity edema Breast:  denies any pain or lumps or nodules in either breasts All other systems were reviewed with the patient and are negative. 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 Status post bilateral mastectomies at [facility], radiation, and chemotherapy CEF x5 cycles followed by radiation to the right chest  Current treatment: Anastrozole started 2009 Anastrozole toxicities: Denies any hot flashes or myalgias.  Breast cancer surveillance: 1.  01/20/2020: Examination: Bilateral mastectomies 2.  No role of imaging studies since she had bilateral mastectomies  Patient wishes to stay on anastrozole indefinitely.  Recurrent DVTs  lab testing was done and she wasfound to be a heterozygotefor the factor V Leiden gene mutation.She was anticoagulated and she has been kept on therapeutic Coumadin.  She does her INR checks at home. Pharmacy has been managing her INRs.  Return to clinic in 1 year for follow-up  I discussed the assessment and treatment plan with the patient. The patient was provided an opportunity to ask questions and all were answered. The patient agreed with the plan and demonstrated an understanding of the instructions. The patient was advised to call back or seek an in-person evaluation if the symptoms worsen or if the condition fails to improve as anticipated.   I provided 11 minutes of non-face-to-face time during this encounter. Harriette Ohara, MD

## 2020-01-26 ENCOUNTER — Telehealth: Payer: Self-pay | Admitting: Hematology and Oncology

## 2020-01-26 NOTE — Telephone Encounter (Signed)
Scheduled per 04/13 los, patient has been called and notified.  

## 2020-02-02 ENCOUNTER — Encounter: Payer: Self-pay | Admitting: Hematology and Oncology

## 2020-02-02 ENCOUNTER — Telehealth: Payer: Self-pay

## 2020-02-02 DIAGNOSIS — D6851 Activated protein C resistance: Secondary | ICD-10-CM | POA: Diagnosis not present

## 2020-02-02 DIAGNOSIS — Z7901 Long term (current) use of anticoagulants: Secondary | ICD-10-CM | POA: Diagnosis not present

## 2020-02-02 LAB — POCT INR

## 2020-02-02 NOTE — Telephone Encounter (Signed)
LVM asking Jessica Reilly to bring ov notes/labs if she is able to her appt on 4/28 with Dr. Gwenlyn Found. Because she is self referral, we cannot request records without her consent.

## 2020-02-04 ENCOUNTER — Ambulatory Visit: Payer: PPO | Admitting: Cardiovascular Disease

## 2020-02-04 ENCOUNTER — Encounter: Payer: Self-pay | Admitting: Cardiovascular Disease

## 2020-02-04 ENCOUNTER — Other Ambulatory Visit: Payer: Self-pay

## 2020-02-04 DIAGNOSIS — R002 Palpitations: Secondary | ICD-10-CM

## 2020-02-04 DIAGNOSIS — R0602 Shortness of breath: Secondary | ICD-10-CM | POA: Diagnosis not present

## 2020-02-04 DIAGNOSIS — R0789 Other chest pain: Secondary | ICD-10-CM | POA: Insufficient documentation

## 2020-02-04 DIAGNOSIS — E7800 Pure hypercholesterolemia, unspecified: Secondary | ICD-10-CM

## 2020-02-04 DIAGNOSIS — I10 Essential (primary) hypertension: Secondary | ICD-10-CM

## 2020-02-04 DIAGNOSIS — I82629 Acute embolism and thrombosis of deep veins of unspecified upper extremity: Secondary | ICD-10-CM

## 2020-02-04 LAB — LIPID PANEL
Chol/HDL Ratio: 3.5 ratio (ref 0.0–4.4)
Cholesterol, Total: 129 mg/dL (ref 100–199)
HDL: 37 mg/dL — ABNORMAL LOW (ref >39)
LDL Chol Calc (NIH): 65 mg/dL (ref 0–99)
Triglycerides: 157 mg/dL — ABNORMAL HIGH (ref 0–149)
VLDL Cholesterol Cal: 27 mg/dL (ref 5–40)

## 2020-02-04 LAB — HEPATIC FUNCTION PANEL
ALT: 27 IU/L (ref 0–32)
AST: 35 IU/L (ref 0–40)
Albumin: 4.2 g/dL (ref 3.8–4.8)
Alkaline Phosphatase: 107 IU/L (ref 39–117)
Bilirubin Total: <0.2 mg/dL (ref 0.0–1.2)
Bilirubin, Direct: 0.08 mg/dL (ref 0.00–0.40)
Total Protein: 7.1 g/dL (ref 6.0–8.5)

## 2020-02-04 NOTE — Progress Notes (Signed)
02/04/2020 Jessica Reilly   May 20, 1961  [id number]  Primary Physician Sharilyn Sites, MD Primary Cardiologist: Lorretta Harp MD Jessica Reilly, [place]  HPI:  Jessica Reilly is a 60 y.o. early overweight divorced Caucasian female mother of 2 daughters, grandmother of 2 grandsons referred by Dr. Hilma Favors as well as her previous cardiologist Dr. Doneta Public to be established my practice because of previously treated cardiovascular problems and proximity.  Her cardiovascular risk factor profile is only notable for treated hyperlipidemia.  She is never smoked.  She is not diabetic.  There is no family history.  She is never had a heart attack or stroke.  She does admit to having anxiety and panic attacks.  She is had breast cancer in the past with bilateral mastectomies, radiation and chemotherapy in 2009 and is currently cancer free.  She apparently has factor V Leiden deficiency and has had bilateral upper extremity DVTs currently on Coumadin oral anticoagulation followed by Dr. [name] in the past.  She had a net negative Myoview stress test October 2020 and echoes in the past that have been unremarkable as well.  She has had a Zio patch that showed PVCs, PACs, short runs of nonsustained VT and supraventricular tachycardia which I suspect she is on metoprolol.  She complains of increasing shortness of breath, constant chest pain for last several weeks which sounds noncardiac and increasing palpitations.   Current Meds  Medication Sig  . acetaminophen (TYLENOL) 500 MG tablet Take 1,000 mg by mouth as needed.  Marland Kitchen alprazolam (XANAX) 2 MG tablet Take 2 mg by mouth 2 (two) times daily as needed (May take a 1 mg during the day but always takes 2 mg at bedtime.). For anxiety  . anastrozole (ARIMIDEX) 1 MG tablet Take 1 tablet (1 mg total) by mouth daily.  . Calcium Carbonate-Vit D-Min (CALCIUM 600+D3 PLUS MINERALS) 600-800 MG-UNIT TABS Take by mouth.  . cyanocobalamin (,VITAMIN B-12,) 1000 MCG/ML  injection Inject 100 mcg into the muscle every 30 (thirty) days.   Marland Kitchen docusate sodium (STOOL SOFTENER) 100 MG capsule Take 2 capsules (200 mg total) by mouth at bedtime.  Marland Kitchen esomeprazole (NEXIUM) 40 MG capsule Take 40 mg by mouth 2 (two) times daily before a meal.  . fluticasone (FLONASE) 50 MCG/ACT nasal spray Place 2 sprays into the nose daily.   Marland Kitchen ibuprofen (ADVIL,MOTRIN) 800 MG tablet Take 800 mg by mouth as needed.   Marland Kitchen levocetirizine (XYZAL) 5 MG tablet TAKE ONE TABLET BY MOUTH AT BEDTIME.  Marland Kitchen lidocaine (LIDODERM) 5 % Place 1 patch onto the skin as needed. Remove & Discard patch within 12 hours or as directed by MD (only uses prn)  . Magnesium 400 MG TABS Take 1 tablet by mouth daily.  . methocarbamol (ROBAXIN) 500 MG tablet Take 1 tablet (500 mg total) by mouth every 6 (six) hours as needed for muscle spasms.  . metoprolol (TOPROL-XL) 100 MG 24 hr tablet Take 100 mg by mouth daily.    Marland Kitchen omega-3 fish oil (MAXEPA) 1000 MG CAPS capsule Take 1 capsule (1,000 mg total) by mouth 2 (two) times daily.  . promethazine (PHENERGAN) 25 MG tablet Take 25 mg by mouth as needed for nausea.   . simvastatin (ZOCOR) 20 MG tablet Take 20 mg by mouth daily.   Marland Kitchen tiZANidine (ZANAFLEX) 4 MG capsule Take 1 capsule (4 mg total) by mouth at bedtime.  Marland Kitchen warfarin (COUMADIN) 7.5 MG tablet Take 1 tablet (7.5 mg total) by mouth daily.  Except 1/2 tablet on Tue/Thur     Allergies  Allergen Reactions  . Amoxicillin-Pot Clavulanate Rash  . Ceftin Rash  . Cefuroxime Rash  . Cefuroxime Axetil Rash  . Risperidone And Related Other (See Comments)    Has medical contraindications    Social History   Socioeconomic History  . Marital status: Divorced    Spouse name: Not on file  . Number of children: Not on file  . Years of education: Not on file  . Highest education level: Not on file  Occupational History  . Not on file  Tobacco Use  . Smoking status: Never Smoker  . Smokeless tobacco: Never Used  Substance and  Sexual Activity  . Alcohol use: Yes    Alcohol/week: 0.0 standard drinks    Comment: Rarely.  . Drug use: No  . Sexual activity: Never    Birth control/protection: Surgical    Comment: partial hyst  Other Topics Concern  . Not on file  Social History Narrative  . Not on file   Social Determinants of Health   Financial Resource Strain:   . Difficulty of Paying Living Expenses:   Food Insecurity:   . Worried About Charity fundraiser in the Last Year:   . Arboriculturist in the Last Year:   Transportation Needs:   . Film/video editor (Medical):   Marland Kitchen Lack of Transportation (Non-Medical):   Physical Activity:   . Days of Exercise per Week:   . Minutes of Exercise per Session:   Stress:   . Feeling of Stress :   Social Connections:   . Frequency of Communication with Friends and Family:   . Frequency of Social Gatherings with Friends and Family:   . Attends Religious Services:   . Active Member of Clubs or Organizations:   . Attends Archivist Meetings:   Marland Kitchen Marital Status:   Intimate Partner Violence:   . Fear of Current or Ex-Partner:   . Emotionally Abused:   Marland Kitchen Physically Abused:   . Sexually Abused:      Review of Systems: General: negative for chills, fever, night sweats or weight changes.  Cardiovascular: negative for chest pain, dyspnea on exertion, edema, orthopnea, palpitations, paroxysmal nocturnal dyspnea or shortness of breath Dermatological: negative for rash Respiratory: negative for cough or wheezing Urologic: negative for hematuria Abdominal: negative for nausea, vomiting, diarrhea, bright red blood per rectum, melena, or hematemesis Neurologic: negative for visual changes, syncope, or dizziness All other systems reviewed and are otherwise negative except as noted above.    Blood pressure (!) 144/84, pulse 66, height 5\' 1"  (1.549 m), weight 218 lb (98.9 kg).  General appearance: alert and no distress Neck: no adenopathy, no carotid  bruit, no JVD, supple, symmetrical, trachea midline and thyroid not enlarged, symmetric, no tenderness/mass/nodules Lungs: clear to auscultation bilaterally Heart: regular rate and rhythm, S1, S2 normal, no murmur, click, rub or gallop Extremities: extremities normal, atraumatic, no cyanosis or edema Pulses: 2+ and symmetric Skin: Skin color, texture, turgor normal. No rashes or lesions Neurologic: Alert and oriented X 3, normal strength and tone. Normal symmetric reflexes. Normal coordination and gait  EKG sinus rhythm at 66 with nonspecific ST and T wave changes and septal Q waves.  I personally reviewed this EKG.  ASSESSMENT AND PLAN:   DVT of upper extremity (deep vein thrombosis) (HCC) Probably related to factor V Leiden deficiency on Coumadin oral anticoagulation  Hypertension History of essential hypertension blood pressure measured at  144/84 on metoprolol  Elevated cholesterol History of hyperlipidemia on statin therapy.  We will recheck a lipid liver profile today  Atypical chest pain History of atypical chest pain with negative Myoview stress test as recently as 07/28/2019.  Her pain lasts all day, is constant and in her left upper chest with occasional radiation to her shoulder.  I doubt this is ischemically mediated.  I am going to get a coronary calcium score to further evaluate  Shortness of breath Complains of shortness of breath which is probably multifactorial.  She has had chest irradiation.  This also may be related to deconditioning and obesity.  Previous echoes have been normal.  We will recheck a 2D echocardiogram.  Palpitations Complains of increasing palpitations.  She did have a Zio patch performed 11/19 which revealed sinus rhythm with some PACs, short runs of atrial tachycardia and PVCs as well as small runs of atrial flutter.  I am not a recheck of 2 weeks ago patch.      Lorretta Harp MD FACP,FACC,FAHA, [facility] 02/04/2020 9:31 AM

## 2020-02-04 NOTE — Patient Instructions (Signed)
Medication Instructions:  The current medical regimen is effective;  continue present plan and medications.  *If you need a refill on your cardiac medications before your next appointment, please call your pharmacy*   Lab Work: LIPID and LIVER today  If you have labs (blood work) drawn today and your tests are completely normal, you will receive your results only by: Marland Kitchen MyChart Message (if you have MyChart) OR . A paper copy in the mail If you have any lab test that is abnormal or we need to change your treatment, we will call you to review the results.   Testing/Procedures: Echocardiogram - Your physician has requested that you have an echocardiogram. Echocardiography is a painless test that uses sound waves to create images of your heart. It provides your doctor with information about the size and shape of your heart and how well your heart's chambers and valves are working. This procedure takes approximately one hour. There are no restrictions for this procedure. This will be performed at our [facility] location - [address].  CALCIUM SCORE  Your physician has recommended that you wear a 14 DAY ZIO-PATCH monitor. The Zio patch cardiac monitor continuously records heart rhythm data for up to 14 days, this is for patients being evaluated for multiple types heart rhythms. For the first 24 hours post application, please avoid getting the Zio monitor wet in the shower or by excessive sweating during exercise. After that, feel free to carry on with regular activities. Keep soaps and lotions away from the ZIO XT Patch.  This will be mailed to you, please expect 7-10 days to receive.         Follow-Up: At [facility], you and your health needs are our priority.  As part of our continuing mission to provide you with exceptional heart care, we have created designated Provider Care Teams.  These Care Teams include your primary Cardiologist (physician) and Advanced Practice  Providers (APPs -  Physician Assistants and Nurse Practitioners) who all work together to provide you with the care you need, when you need it.  We recommend signing up for the patient portal called "MyChart".  Sign up information is provided on this After Visit Summary.  MyChart is used to connect with patients for Virtual Visits (Telemedicine).  Patients are able to view lab/test results, encounter notes, upcoming appointments, etc.  Non-urgent messages can be sent to your provider as well.   To learn more about what you can do with MyChart, go to [email or web address].    Your next appointment:   6 month(s)  The format for your next appointment:   In Person  Provider:   Quay Burow, MD

## 2020-02-04 NOTE — Assessment & Plan Note (Signed)
History of essential hypertension blood pressure measured at 144/84 on metoprolol

## 2020-02-04 NOTE — Assessment & Plan Note (Signed)
History of atypical chest pain with negative Myoview stress test as recently as 07/28/2019.  Her pain lasts all day, is constant and in her left upper chest with occasional radiation to her shoulder.  I doubt this is ischemically mediated.  I am going to get a coronary calcium score to further evaluate

## 2020-02-04 NOTE — Assessment & Plan Note (Signed)
Complains of shortness of breath which is probably multifactorial.  She has had chest irradiation.  This also may be related to deconditioning and obesity.  Previous echoes have been normal.  We will recheck a 2D echocardiogram.

## 2020-02-04 NOTE — Assessment & Plan Note (Signed)
History of hyperlipidemia on statin therapy.  We will recheck a lipid liver profile today. 

## 2020-02-04 NOTE — Assessment & Plan Note (Signed)
Complains of increasing palpitations.  She did have a Zio patch performed 11/19 which revealed sinus rhythm with some PACs, short runs of atrial tachycardia and PVCs as well as small runs of atrial flutter.  I am not a recheck of 2 weeks ago patch.

## 2020-02-04 NOTE — Assessment & Plan Note (Signed)
Probably related to factor V Leiden deficiency on Coumadin oral anticoagulation

## 2020-02-10 NOTE — Addendum Note (Signed)
Addended by: Zebedee Iba on: 02/10/2020 12:06 PM   Modules accepted: Orders

## 2020-02-11 ENCOUNTER — Ambulatory Visit (INDEPENDENT_AMBULATORY_CARE_PROVIDER_SITE_OTHER): Payer: PPO

## 2020-02-11 DIAGNOSIS — R002 Palpitations: Secondary | ICD-10-CM | POA: Diagnosis not present

## 2020-02-11 DIAGNOSIS — R0789 Other chest pain: Secondary | ICD-10-CM

## 2020-02-24 ENCOUNTER — Ambulatory Visit (INDEPENDENT_AMBULATORY_CARE_PROVIDER_SITE_OTHER): Payer: PPO | Admitting: Internal Medicine

## 2020-02-25 ENCOUNTER — Ambulatory Visit
Admission: RE | Admit: 2020-02-25 | Discharge: 2020-02-25 | Disposition: A | Discharge status: Home / Self Care | Payer: PPO | Source: Ambulatory Visit | Attending: Cardiovascular Disease | Admitting: Cardiovascular Disease

## 2020-02-25 ENCOUNTER — Other Ambulatory Visit: Payer: Self-pay

## 2020-02-25 ENCOUNTER — Ambulatory Visit: Admission: RE | Admit: 2020-02-25 | Payer: PPO | Source: Ambulatory Visit

## 2020-02-25 ENCOUNTER — Ambulatory Visit (HOSPITAL_COMMUNITY): Payer: PPO | Attending: Cardiovascular Disease

## 2020-02-25 DIAGNOSIS — R0789 Other chest pain: Secondary | ICD-10-CM

## 2020-02-25 DIAGNOSIS — I700 Atherosclerosis of aorta: Secondary | ICD-10-CM | POA: Diagnosis not present

## 2020-03-14 DIAGNOSIS — R0789 Other chest pain: Secondary | ICD-10-CM | POA: Diagnosis not present

## 2020-03-14 DIAGNOSIS — R002 Palpitations: Secondary | ICD-10-CM | POA: Diagnosis not present

## 2020-03-15 ENCOUNTER — Other Ambulatory Visit: Payer: Self-pay | Admitting: Cardiovascular Disease

## 2020-03-15 DIAGNOSIS — R002 Palpitations: Secondary | ICD-10-CM

## 2020-03-15 DIAGNOSIS — R0789 Other chest pain: Secondary | ICD-10-CM

## 2020-03-16 ENCOUNTER — Other Ambulatory Visit: Payer: Self-pay

## 2020-03-16 ENCOUNTER — Other Ambulatory Visit: Payer: Self-pay | Admitting: Sports Medicine

## 2020-03-16 DIAGNOSIS — M79662 Pain in left lower leg: Secondary | ICD-10-CM | POA: Diagnosis not present

## 2020-03-16 DIAGNOSIS — M5416 Radiculopathy, lumbar region: Secondary | ICD-10-CM

## 2020-03-16 DIAGNOSIS — M545 Low back pain: Secondary | ICD-10-CM | POA: Diagnosis not present

## 2020-03-23 ENCOUNTER — Telehealth: Payer: Self-pay | Admitting: Cardiovascular Disease

## 2020-03-23 NOTE — Telephone Encounter (Signed)
Patient called - notified her of echo, CT calcium score, monitor results. Explained that risk factor modification is important - BP control, cholesterol management, blood sugar control.  She is supposed to have a follow up ~ Oct 2021.  She is fairly anxious on the phone so I scheduled her for OV on 6/29 @ 0815

## 2020-03-23 NOTE — Telephone Encounter (Signed)
Patient is calling about her test results she does have some questions about it.

## 2020-04-06 ENCOUNTER — Ambulatory Visit: Payer: PPO | Admitting: Cardiovascular Disease

## 2020-04-21 ENCOUNTER — Inpatient Hospital Stay
Admission: RE | Admit: 2020-04-21 | Discharge: 2020-04-21 | Disposition: A | Discharge status: Home / Self Care | Payer: PPO | Source: Ambulatory Visit | Attending: Sports Medicine | Admitting: Sports Medicine

## 2020-04-27 ENCOUNTER — Other Ambulatory Visit: Payer: Self-pay

## 2020-04-27 ENCOUNTER — Encounter: Payer: Self-pay | Admitting: Cardiovascular Disease

## 2020-04-27 ENCOUNTER — Ambulatory Visit: Payer: PPO | Admitting: Cardiovascular Disease

## 2020-04-27 DIAGNOSIS — E7800 Pure hypercholesterolemia, unspecified: Secondary | ICD-10-CM

## 2020-04-27 DIAGNOSIS — R0602 Shortness of breath: Secondary | ICD-10-CM

## 2020-04-27 DIAGNOSIS — R002 Palpitations: Secondary | ICD-10-CM | POA: Diagnosis not present

## 2020-04-27 DIAGNOSIS — R0789 Other chest pain: Secondary | ICD-10-CM

## 2020-04-27 DIAGNOSIS — I10 Essential (primary) hypertension: Secondary | ICD-10-CM

## 2020-04-27 NOTE — Assessment & Plan Note (Signed)
History shortness of breath with recent 2D echo performed 02/25/2020 which was essentially normal with grade 1 diastolic dysfunction.

## 2020-04-27 NOTE — Assessment & Plan Note (Signed)
History of hyperlipidemia on simvastatin.  Her most recent lipid profile performed 01/27/2020 revealed total cholesterol 129, LDL 65 HDL 37.

## 2020-04-27 NOTE — Assessment & Plan Note (Signed)
History of atypical chest pain with a coronary calcium score recently measured at 76, which is relatively low.  Her pain is atypical as well.  I reassured her.

## 2020-04-27 NOTE — Patient Instructions (Signed)
Medication Instructions:  NO CHANGE *If you need a refill on your cardiac medications before your next appointment, please call your pharmacy*   Lab Work: If you have labs (blood work) drawn today and your tests are completely normal, you will receive your results only by: Marland Kitchen MyChart Message (if you have MyChart) OR . A paper copy in the mail If you have any lab test that is abnormal or we need to change your treatment, we will call you to review the results   Follow-Up: At [facility], you and your health needs are our priority.  As part of our continuing mission to provide you with exceptional heart care, we have created designated Provider Care Teams.  These Care Teams include your primary Cardiologist (physician) and Advanced Practice Providers (APPs -  Physician Assistants and Nurse Practitioners) who all work together to provide you with the care you need, when you need it.  We recommend signing up for the patient portal called "MyChart".  Sign up information is provided on this After Visit Summary.  MyChart is used to connect with patients for Virtual Visits (Telemedicine).  Patients are able to view lab/test results, encounter notes, upcoming appointments, etc.  Non-urgent messages can be sent to your provider as well.   To learn more about what you can do with MyChart, go to [email or web address].    Your next appointment:   12 month(s)  The format for your next appointment:   In Person  Provider:   You may see one of the following Advanced Practice Providers on your designated Care Team:    Kerin Ransom, PA-C  [place], [place]  Coletta Memos, [place]  Your physician wants you to follow-up in: 2 [place] will receive a reminder letter in the mail two months in advance. If you don't receive a letter, please call our office to schedule the follow-up appointment.

## 2020-04-27 NOTE — Assessment & Plan Note (Signed)
History of essential hypertension blood pressure measured today 122/88.  She is on metoprolol.

## 2020-04-27 NOTE — Progress Notes (Signed)
04/27/2020 Jessica Reilly   July 03, 1961  [id number]  Primary Physician Jessica Sites, MD Primary Cardiologist: Jessica Harp MD Jessica Reilly, [place]  HPI:  Jessica Reilly is a 59 y.o.  early overweight divorced Caucasian female mother of 2 daughters, grandmother of 2 grandsons referred by Jessica Reilly as well as her previous cardiologist Jessica Reilly to be established my practice because of previously treated cardiovascular problems and proximity.  I last saw her in the office 02/04/2020. Her cardiovascular risk factor profile is only notable for treated hyperlipidemia.  She is never smoked.  She is not diabetic.  There is no family history.  She is never had a heart attack or stroke.  She does admit to having anxiety and panic attacks.  She is had breast cancer in the past with bilateral mastectomies, radiation and chemotherapy in 2009 and is currently cancer free.  She apparently has factor V Leiden deficiency and has had bilateral upper extremity DVTs currently on Coumadin oral anticoagulation followed by Jessica Reilly in the past.  She had a net negative Myoview stress test October 2020 and echoes in the past that have been unremarkable as well.  She has had a Zio patch that showed PVCs, PACs, short runs of nonsustained VT and supraventricular tachycardia which I suspect she is on metoprolol.  She complains of increasing shortness of breath, constant chest pain for last several weeks which sounds noncardiac and increasing palpitations.  2D echo recently performed was essentially normal except for grade 1 diastolic dysfunction.  Event monitor showed occasional PACs, PVCs and short runs of SVT.  A coronary calcium score was 76, relatively low, suggesting absence of CAD.  She does get occasional atypical chest pain.  She also gets palpitations but does admit to drinking caffeine as well.   Current Meds  Medication Sig  . acetaminophen (TYLENOL) 500 MG tablet Take 1,000 mg by mouth as needed.   Marland Kitchen alprazolam (XANAX) 2 MG tablet Take 2 mg by mouth 2 (two) times daily as needed (May take a 1 mg during the day but always takes 2 mg at bedtime.). For anxiety  . anastrozole (ARIMIDEX) 1 MG tablet Take 1 tablet (1 mg total) by mouth daily.  . Calcium Carbonate-Vit D-Min (CALCIUM 600+D3 PLUS MINERALS) 600-800 MG-UNIT TABS Take by mouth.  . cyanocobalamin (,VITAMIN B-12,) 1000 MCG/ML injection Inject 100 mcg into the muscle every 30 (thirty) days.   Marland Kitchen docusate sodium (STOOL SOFTENER) 100 MG capsule Take 2 capsules (200 mg total) by mouth at bedtime.  Marland Kitchen esomeprazole (NEXIUM) 40 MG capsule Take 40 mg by mouth 2 (two) times daily before a meal.  . fluticasone (FLONASE) 50 MCG/ACT nasal spray Place 2 sprays into the nose daily.   Marland Kitchen ibuprofen (ADVIL,MOTRIN) 800 MG tablet Take 800 mg by mouth as needed.   Marland Kitchen levocetirizine (XYZAL) 5 MG tablet TAKE ONE TABLET BY MOUTH AT BEDTIME.  Marland Kitchen lidocaine (LIDODERM) 5 % Place 1 patch onto the skin as needed. Remove & Discard patch within 12 hours or as directed by MD (only uses prn)  . Magnesium 400 MG TABS Take 1 tablet by mouth daily.  . methocarbamol (ROBAXIN) 500 MG tablet Take 1 tablet (500 mg total) by mouth every 6 (six) hours as needed for muscle spasms.  . metoprolol (TOPROL-XL) 100 MG 24 hr tablet Take 100 mg by mouth daily.    Marland Kitchen omega-3 fish oil (MAXEPA) 1000 MG CAPS capsule Take 1 capsule (1,000 mg total) by  mouth 2 (two) times daily.  . promethazine (PHENERGAN) 25 MG tablet Take 25 mg by mouth as needed for nausea.   . simvastatin (ZOCOR) 20 MG tablet Take 20 mg by mouth daily.   Marland Kitchen tiZANidine (ZANAFLEX) 4 MG capsule Take 1 capsule (4 mg total) by mouth at bedtime.  Marland Kitchen warfarin (COUMADIN) 7.5 MG tablet Take 1 tablet (7.5 mg total) by mouth daily. Except 1/2 tablet on Tue/Thur     Allergies  Allergen Reactions  . Amoxicillin-Pot Clavulanate Rash  . Ceftin Rash  . Cefuroxime Rash  . Cefuroxime Axetil Rash  . Risperidone And Related Other (See  Comments)    Has medical contraindications    Social History   Socioeconomic History  . Marital status: Divorced    Spouse name: Not on file  . Number of children: Not on file  . Years of education: Not on file  . Highest education level: Not on file  Occupational History  . Not on file  Tobacco Use  . Smoking status: Never Smoker  . Smokeless tobacco: Never Used  Substance and Sexual Activity  . Alcohol use: Yes    Alcohol/week: 0.0 standard drinks    Comment: Rarely.  . Drug use: No  . Sexual activity: Never    Birth control/protection: Surgical    Comment: partial hyst  Other Topics Concern  . Not on file  Social History Narrative  . Not on file   Social Determinants of Health   Financial Resource Strain:   . Difficulty of Paying Living Expenses:   Food Insecurity:   . Worried About Charity fundraiser in the Last Year:   . Arboriculturist in the Last Year:   Transportation Needs:   . Film/video editor (Medical):   Marland Kitchen Lack of Transportation (Non-Medical):   Physical Activity:   . Days of Exercise per Week:   . Minutes of Exercise per Session:   Stress:   . Feeling of Stress :   Social Connections:   . Frequency of Communication with Friends and Family:   . Frequency of Social Gatherings with Friends and Family:   . Attends Religious Services:   . Active Member of Clubs or Organizations:   . Attends Archivist Meetings:   Marland Kitchen Marital Status:   Intimate Partner Violence:   . Fear of Current or Ex-Partner:   . Emotionally Abused:   Marland Kitchen Physically Abused:   . Sexually Abused:      Review of Systems: General: negative for chills, fever, night sweats or weight changes.  Cardiovascular: negative for chest pain, dyspnea on exertion, edema, orthopnea, palpitations, paroxysmal nocturnal dyspnea or shortness of breath Dermatological: negative for rash Respiratory: negative for cough or wheezing Urologic: negative for hematuria Abdominal: negative for  nausea, vomiting, diarrhea, bright red blood per rectum, melena, or hematemesis Neurologic: negative for visual changes, syncope, or dizziness All other systems reviewed and are otherwise negative except as noted above.    Blood pressure 122/88, pulse 72, height 5\' 1"  (1.549 m), weight 216 lb (98 kg).  General appearance: alert and no distress Neck: no adenopathy, no carotid bruit, no JVD, supple, symmetrical, trachea midline and thyroid not enlarged, symmetric, no tenderness/mass/nodules Lungs: clear to auscultation bilaterally Heart: regular rate and rhythm, S1, S2 normal, no murmur, click, rub or gallop Extremities: extremities normal, atraumatic, no cyanosis or edema Pulses: 2+ and symmetric Skin: Skin color, texture, turgor normal. No rashes or lesions Neurologic: Alert and oriented X 3, normal  strength and tone. Normal symmetric reflexes. Normal coordination and gait  EKG not performed today  ASSESSMENT AND PLAN:   Hypertension History of essential hypertension blood pressure measured today 122/88.  She is on metoprolol.  Elevated cholesterol History of hyperlipidemia on simvastatin.  Her most recent lipid profile performed 01/27/2020 revealed total cholesterol 129, LDL 65 HDL 37.  Atypical chest pain History of atypical chest pain with a coronary calcium score recently measured at 76, which is relatively low.  Her pain is atypical as well.  I reassured her.  Shortness of breath History shortness of breath with recent 2D echo performed 02/25/2020 which was essentially normal with grade 1 diastolic dysfunction.  Palpitations History of palpitations with event monitoring performed 02/11/2020 that showed occasional PACs and PVCs and short runs of SVT.  She is on high-dose beta-blocker.  Talked about the importance of caffeine avoidance.      Jessica Harp MD FACP,FACC,FAHA, [facility] 04/27/2020 10:08 AM

## 2020-04-27 NOTE — Assessment & Plan Note (Signed)
History of palpitations with event monitoring performed 02/11/2020 that showed occasional PACs and PVCs and short runs of SVT.  She is on high-dose beta-blocker.  Talked about the importance of caffeine avoidance.

## 2020-04-28 DIAGNOSIS — Z6839 Body mass index (BMI) 39.0-39.9, adult: Secondary | ICD-10-CM | POA: Diagnosis not present

## 2020-04-28 DIAGNOSIS — Z1389 Encounter for screening for other disorder: Secondary | ICD-10-CM | POA: Diagnosis not present

## 2020-04-28 DIAGNOSIS — M1991 Primary osteoarthritis, unspecified site: Secondary | ICD-10-CM | POA: Diagnosis not present

## 2020-04-28 DIAGNOSIS — Z0001 Encounter for general adult medical examination with abnormal findings: Secondary | ICD-10-CM | POA: Diagnosis not present

## 2020-04-28 DIAGNOSIS — R7309 Other abnormal glucose: Secondary | ICD-10-CM | POA: Diagnosis not present

## 2020-04-28 DIAGNOSIS — M797 Fibromyalgia: Secondary | ICD-10-CM | POA: Diagnosis not present

## 2020-04-28 DIAGNOSIS — D682 Hereditary deficiency of other clotting factors: Secondary | ICD-10-CM | POA: Diagnosis not present

## 2020-04-28 DIAGNOSIS — I10 Essential (primary) hypertension: Secondary | ICD-10-CM | POA: Diagnosis not present

## 2020-04-28 DIAGNOSIS — E785 Hyperlipidemia, unspecified: Secondary | ICD-10-CM | POA: Diagnosis not present

## 2020-04-28 DIAGNOSIS — F419 Anxiety disorder, unspecified: Secondary | ICD-10-CM | POA: Diagnosis not present

## 2020-04-28 DIAGNOSIS — C50919 Malignant neoplasm of unspecified site of unspecified female breast: Secondary | ICD-10-CM | POA: Diagnosis not present

## 2020-05-11 DIAGNOSIS — Z9013 Acquired absence of bilateral breasts and nipples: Secondary | ICD-10-CM | POA: Diagnosis not present

## 2020-05-11 DIAGNOSIS — D6851 Activated protein C resistance: Secondary | ICD-10-CM | POA: Diagnosis not present

## 2020-05-11 DIAGNOSIS — Z170 Estrogen receptor positive status [ER+]: Secondary | ICD-10-CM | POA: Diagnosis not present

## 2020-05-11 DIAGNOSIS — C50911 Malignant neoplasm of unspecified site of right female breast: Secondary | ICD-10-CM | POA: Diagnosis not present

## 2020-05-11 DIAGNOSIS — Z79811 Long term (current) use of aromatase inhibitors: Secondary | ICD-10-CM | POA: Diagnosis not present

## 2020-05-11 DIAGNOSIS — C50211 Malignant neoplasm of upper-inner quadrant of right female breast: Secondary | ICD-10-CM | POA: Diagnosis not present

## 2020-05-11 DIAGNOSIS — Z7901 Long term (current) use of anticoagulants: Secondary | ICD-10-CM | POA: Diagnosis not present

## 2020-05-11 DIAGNOSIS — Z9221 Personal history of antineoplastic chemotherapy: Secondary | ICD-10-CM | POA: Diagnosis not present

## 2020-05-17 ENCOUNTER — Telehealth: Payer: Self-pay | Admitting: Hematology and Oncology

## 2020-05-17 NOTE — Telephone Encounter (Signed)
Rescheduled appointment per 8/5 provider request. Left message with patient's updated appointment date and time.

## 2020-05-19 DIAGNOSIS — H04123 Dry eye syndrome of bilateral lacrimal glands: Secondary | ICD-10-CM | POA: Diagnosis not present

## 2020-05-19 DIAGNOSIS — Z961 Presence of intraocular lens: Secondary | ICD-10-CM | POA: Diagnosis not present

## 2020-05-19 DIAGNOSIS — H5310 Unspecified subjective visual disturbances: Secondary | ICD-10-CM | POA: Diagnosis not present

## 2020-05-19 DIAGNOSIS — H43811 Vitreous degeneration, right eye: Secondary | ICD-10-CM | POA: Diagnosis not present

## 2020-06-01 ENCOUNTER — Other Ambulatory Visit: Payer: PPO | Admitting: Adult Health

## 2020-07-06 DIAGNOSIS — Z7901 Long term (current) use of anticoagulants: Secondary | ICD-10-CM | POA: Diagnosis not present

## 2020-07-06 DIAGNOSIS — D6851 Activated protein C resistance: Secondary | ICD-10-CM | POA: Diagnosis not present

## 2020-07-06 LAB — POCT INR: INR: 2.2 (ref 2.0–3.0)

## 2020-08-03 LAB — POCT INR: INR: 2.2 (ref 2.0–3.0)

## 2020-08-11 ENCOUNTER — Encounter: Payer: Self-pay | Admitting: Hematology and Oncology

## 2020-08-25 DIAGNOSIS — H900 Conductive hearing loss, bilateral: Secondary | ICD-10-CM | POA: Diagnosis not present

## 2020-08-25 DIAGNOSIS — H6123 Impacted cerumen, bilateral: Secondary | ICD-10-CM | POA: Diagnosis not present

## 2020-08-25 DIAGNOSIS — H9202 Otalgia, left ear: Secondary | ICD-10-CM | POA: Diagnosis not present

## 2020-09-06 LAB — POCT INR: INR: 2.2 (ref 2.0–3.0)

## 2020-11-01 ENCOUNTER — Other Ambulatory Visit: Payer: Self-pay | Admitting: Hematology and Oncology

## 2020-11-09 DIAGNOSIS — Z08 Encounter for follow-up examination after completed treatment for malignant neoplasm: Secondary | ICD-10-CM | POA: Diagnosis not present

## 2020-11-09 DIAGNOSIS — Z79811 Long term (current) use of aromatase inhibitors: Secondary | ICD-10-CM | POA: Diagnosis not present

## 2020-11-09 DIAGNOSIS — M8588 Other specified disorders of bone density and structure, other site: Secondary | ICD-10-CM | POA: Diagnosis not present

## 2020-11-09 DIAGNOSIS — Z853 Personal history of malignant neoplasm of breast: Secondary | ICD-10-CM | POA: Diagnosis not present

## 2020-11-09 DIAGNOSIS — Z170 Estrogen receptor positive status [ER+]: Secondary | ICD-10-CM | POA: Diagnosis not present

## 2020-11-09 DIAGNOSIS — M85852 Other specified disorders of bone density and structure, left thigh: Secondary | ICD-10-CM | POA: Diagnosis not present

## 2020-11-09 DIAGNOSIS — C50911 Malignant neoplasm of unspecified site of right female breast: Secondary | ICD-10-CM | POA: Diagnosis not present

## 2020-11-22 DIAGNOSIS — F419 Anxiety disorder, unspecified: Secondary | ICD-10-CM | POA: Diagnosis not present

## 2020-11-24 DIAGNOSIS — H04123 Dry eye syndrome of bilateral lacrimal glands: Secondary | ICD-10-CM | POA: Diagnosis not present

## 2020-11-24 DIAGNOSIS — H25812 Combined forms of age-related cataract, left eye: Secondary | ICD-10-CM | POA: Diagnosis not present

## 2020-11-24 DIAGNOSIS — H5212 Myopia, left eye: Secondary | ICD-10-CM | POA: Diagnosis not present

## 2020-11-24 DIAGNOSIS — H43813 Vitreous degeneration, bilateral: Secondary | ICD-10-CM | POA: Diagnosis not present

## 2020-12-13 DIAGNOSIS — D6851 Activated protein C resistance: Secondary | ICD-10-CM | POA: Diagnosis not present

## 2020-12-13 DIAGNOSIS — Z7901 Long term (current) use of anticoagulants: Secondary | ICD-10-CM | POA: Diagnosis not present

## 2021-01-19 ENCOUNTER — Ambulatory Visit: Payer: PPO | Admitting: Hematology and Oncology

## 2021-01-19 NOTE — Assessment & Plan Note (Signed)
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 Status [facility], radiation, and chemotherapyCEF x5 cyclesfollowed by radiation to the right chest  Current treatment:Anastrozole started 2009 Anastrozole toxicities:Denies any hot flashes or myalgias.  Breast cancer surveillance: 1.01/20/2021: Examination: Bilateral mastectomies, no palpable lumps or nodules of concern 2.No role of imaging studies since she had bilateral mastectomies  Patient wishes to stay on anastrozole indefinitely.  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 She does her INR checks at home. Pharmacy has been managing her INRs.  Return to clinic in 1 year for follow-up

## 2021-01-19 NOTE — Progress Notes (Signed)
Patient Care Team: Sharilyn Sites, MD as PCP - General (Family Medicine)  DIAGNOSIS:    ICD-10-CM   1. Malignant neoplasm of upper-outer quadrant of right breast in female, estrogen receptor positive ([place])  C50.411    Z17.0     SUMMARY OF ONCOLOGIC HISTORY: Oncology History  Malignant neoplasm of upper-outer quadrant of right breast in female, estrogen receptor positive ([place])  2008 Initial Diagnosis   T2N0, ER PR positive, HER-2 negative, invasive ductal carcinoma of the right breast.    Status post bilateral mastectomies, radiation and antiestrogen therapy since 2009 treatment was done at [facility].   2009 -  Anti-estrogen oral therapy   Anastrozole 1 mg daily.  Plan is to treat her indefinitely.  Patient understands risks and benefits.  There is no data for long-term antiestrogen therapy but she is very anxious to come off of it.     CHIEF COMPLIANT: Follow-up of right breast cancer on anastrozole   INTERVAL HISTORY: Jessica Reilly is a 60 y.o. with above-mentioned history of right breast cancer who underwent bilateral mastectomies, radiation, and is currently on antiestrogen therapy with anastrozole. She presents to the clinic today for follow-up.   ALLERGIES:  is allergic to amoxicillin-pot clavulanate, ceftin, cefuroxime, cefuroxime axetil, and risperidone and related.  MEDICATIONS:  Current Outpatient Medications  Medication Sig Dispense Refill  . acetaminophen (TYLENOL) 500 MG tablet Take 1,000 mg by mouth as needed.    Marland Kitchen alprazolam (XANAX) 2 MG tablet Take 2 mg by mouth 2 (two) times daily as needed (May take a 1 mg during the day but always takes 2 mg at bedtime.). For anxiety    . anastrozole (ARIMIDEX) 1 MG tablet TAKE 1 TABLET BY MOUTH DAILY. 90 tablet 0  . Calcium Carbonate-Vit D-Min (CALCIUM 600+D3 PLUS MINERALS) 600-800 MG-UNIT TABS Take by mouth.    . cyanocobalamin (,VITAMIN B-12,) 1000 MCG/ML injection Inject 100 mcg into the muscle every 30 (thirty)  days.     Marland Kitchen docusate sodium (STOOL SOFTENER) 100 MG capsule Take 2 capsules (200 mg total) by mouth at bedtime. 10 capsule 0  . esomeprazole (NEXIUM) 40 MG capsule Take 40 mg by mouth 2 (two) times daily before a meal.    . fluticasone (FLONASE) 50 MCG/ACT nasal spray Place 2 sprays into the nose daily.     Marland Kitchen ibuprofen (ADVIL,MOTRIN) 800 MG tablet Take 800 mg by mouth as needed.     Marland Kitchen levocetirizine (XYZAL) 5 MG tablet TAKE ONE TABLET BY MOUTH AT BEDTIME. 30 tablet 3  . lidocaine (LIDODERM) 5 % Place 1 patch onto the skin as needed. Remove & Discard patch within 12 hours or as directed by MD (only uses prn)    . Magnesium 400 MG TABS Take 1 tablet by mouth daily.    . methocarbamol (ROBAXIN) 500 MG tablet Take 1 tablet (500 mg total) by mouth every 6 (six) hours as needed for muscle spasms. 90 tablet 1  . metoprolol (TOPROL-XL) 100 MG 24 hr tablet Take 100 mg by mouth daily.      Marland Kitchen omega-3 fish oil (MAXEPA) 1000 MG CAPS capsule Take 1 capsule (1,000 mg total) by mouth 2 (two) times daily. 180 each 3  . promethazine (PHENERGAN) 25 MG tablet Take 25 mg by mouth as needed for nausea.     . simvastatin (ZOCOR) 20 MG tablet Take 20 mg by mouth daily.     Marland Kitchen tiZANidine (ZANAFLEX) 4 MG capsule Take 1 capsule (4 mg total) by mouth  at bedtime. 30 capsule 0  . warfarin (COUMADIN) 7.5 MG tablet Take 1 tablet (7.5 mg total) by mouth daily. Except 1/2 tablet on Tue/Thur 90 tablet 3   No current facility-administered medications for this visit.    PHYSICAL EXAMINATION: ECOG PERFORMANCE STATUS: 1 - Symptomatic but completely ambulatory  Vitals:   01/20/21 1021  BP: 139/89  Pulse: 71  Resp: 20  Temp: 98.1 F (36.7 C)  SpO2: 97%   Filed Weights   01/20/21 1021  Weight: 216 lb 6.4 oz (98.2 kg)    BREAST: No palpable masses or nodules in either right or left breasts. No palpable axillary supraclavicular or infraclavicular adenopathy no breast tenderness or nipple discharge. (exam performed in the  presence of a chaperone)  LABORATORY DATA:  I have reviewed the data as listed CMP Latest Ref Rng & Units 02/04/2020 05/25/2017 04/19/2016  Glucose 65 - 99 mg/dL - 89 100(H)  BUN 6 - 24 mg/dL - 10 15  Creatinine 0.57 - 1.00 mg/dL - 1.03(H) 1.19(H)  Sodium 134 - 144 mmol/L - 142 134(L)  Potassium 3.5 - 5.2 mmol/L - 4.4 4.1  Chloride 96 - 106 mmol/L - 103 101  CO2 20 - 29 mmol/L - 24 26  Calcium 8.7 - 10.2 mg/dL - 9.6 8.5(L)  Total Protein 6.0 - 8.5 g/dL 7.1 7.1 -  Total Bilirubin 0.0 - 1.2 mg/dL <0.2 0.3 -  Alkaline Phos 39 - 117 IU/L 107 75 -  AST 0 - 40 IU/L 35 16 -  ALT 0 - 32 IU/L 27 18 -    Lab Results  Component Value Date   WBC 8.5 05/25/2017   HGB 13.3 05/25/2017   HCT 38.4 05/25/2017   MCV 88 05/25/2017   PLT 307 05/25/2017   NEUTROABS 2.5 04/19/2016    ASSESSMENT & PLAN:  Malignant neoplasm of upper-outer quadrant of right breast in female, estrogen receptor positive (HCC) T2N0, ER PR positive, HER-2 negative, 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 Status [facility], radiation, and chemotherapyCEF x5 cyclesfollowed by radiation to the right chest  Current treatment:Anastrozole started 2009 Anastrozole toxicities:Denies any hot flashes or myalgias.  Breast cancer surveillance: 1.01/20/2021: Examination: Bilateral mastectomies, no palpable lumps or nodules of concern 2.No role of imaging studies since she had bilateral mastectomies  Patient wishes to stay on anastrozole indefinitely. Bone density done at [facility] February 2022: T score -1.3: Osteopenia I recommended bone density every 2 years  Recurrent DVTslab testing was done and she wasfound to be a heterozygotefor the factor V Leiden gene mutation.She was anticoagulated and she has been kept on therapeutic Coumadin. She does her INR checks at home. Pharmacy has been managing her INRs.  Return to clinic in 1 year for follow-up    No  orders of the defined types were placed in this encounter.  The patient has a good understanding of the overall plan. she agrees with it. she will call with any problems that may develop before the next visit here.  Total time spent: 20 mins including face to face time and time spent for planning, charting and coordination of care  Rulon Eisenmenger, MD, MPH 01/20/2021  I, Molly Dorshimer, am acting as scribe for Dr. Nicholas Lose.  I have reviewed the above documentation for accuracy and completeness, and I agree with the above.

## 2021-01-20 ENCOUNTER — Other Ambulatory Visit: Payer: Self-pay

## 2021-01-20 ENCOUNTER — Inpatient Hospital Stay: Payer: PPO

## 2021-01-20 ENCOUNTER — Inpatient Hospital Stay: Payer: PPO | Attending: Hematology and Oncology | Admitting: Hematology and Oncology

## 2021-01-20 DIAGNOSIS — Z923 Personal history of irradiation: Secondary | ICD-10-CM | POA: Insufficient documentation

## 2021-01-20 DIAGNOSIS — Z9221 Personal history of antineoplastic chemotherapy: Secondary | ICD-10-CM | POA: Insufficient documentation

## 2021-01-20 DIAGNOSIS — Z79811 Long term (current) use of aromatase inhibitors: Secondary | ICD-10-CM | POA: Diagnosis not present

## 2021-01-20 DIAGNOSIS — Z7901 Long term (current) use of anticoagulants: Secondary | ICD-10-CM | POA: Insufficient documentation

## 2021-01-20 DIAGNOSIS — C50411 Malignant neoplasm of upper-outer quadrant of right female breast: Secondary | ICD-10-CM

## 2021-01-20 DIAGNOSIS — M8580 Other specified disorders of bone density and structure, unspecified site: Secondary | ICD-10-CM | POA: Diagnosis not present

## 2021-01-20 DIAGNOSIS — Z9013 Acquired absence of bilateral breasts and nipples: Secondary | ICD-10-CM | POA: Diagnosis not present

## 2021-01-20 DIAGNOSIS — Z170 Estrogen receptor positive status [ER+]: Secondary | ICD-10-CM

## 2021-01-20 DIAGNOSIS — Z79899 Other long term (current) drug therapy: Secondary | ICD-10-CM | POA: Diagnosis not present

## 2021-01-20 LAB — CMP (CANCER CENTER ONLY)
ALT: 27 U/L (ref 0–44)
AST: 33 U/L (ref 15–41)
Albumin: 4 g/dL (ref 3.5–5.0)
Alkaline Phosphatase: 88 U/L (ref 38–126)
Anion gap: 10 (ref 5–15)
BUN: 16 mg/dL (ref 8–23)
CO2: 27 mmol/L (ref 22–32)
Calcium: 9.8 mg/dL (ref 8.9–10.3)
Chloride: 102 mmol/L (ref 98–111)
Creatinine: 1.06 mg/dL — ABNORMAL HIGH (ref 0.44–1.00)
GFR, Estimated: 59 mL/min — ABNORMAL LOW (ref >60)
Glucose, Bld: 133 mg/dL — ABNORMAL HIGH (ref 70–99)
Potassium: 4.7 mmol/L (ref 3.5–5.1)
Sodium: 139 mmol/L (ref 135–145)
Total Bilirubin: 0.4 mg/dL (ref 0.3–1.2)
Total Protein: 7.9 g/dL (ref 6.5–8.1)

## 2021-01-20 LAB — CBC WITH DIFFERENTIAL (CANCER CENTER ONLY)
Abs Immature Granulocytes: 0.04 10*3/uL (ref 0.00–0.07)
Basophils Absolute: 0.1 10*3/uL (ref 0.0–0.1)
Basophils Relative: 1 %
Eosinophils Absolute: 0.2 10*3/uL (ref 0.0–0.5)
Eosinophils Relative: 2 %
HCT: 37.5 % (ref 36.0–46.0)
Hemoglobin: 11.8 g/dL — ABNORMAL LOW (ref 12.0–15.0)
Immature Granulocytes: 0 %
Lymphocytes Relative: 34 %
Lymphs Abs: 3.2 10*3/uL (ref 0.7–4.0)
MCH: 25.9 pg — ABNORMAL LOW (ref 26.0–34.0)
MCHC: 31.5 g/dL (ref 30.0–36.0)
MCV: 82.4 fL (ref 80.0–100.0)
Monocytes Absolute: 0.5 10*3/uL (ref 0.1–1.0)
Monocytes Relative: 5 %
Neutro Abs: 5.4 10*3/uL (ref 1.7–7.7)
Neutrophils Relative %: 58 %
Platelet Count: 323 10*3/uL (ref 150–400)
RBC: 4.55 MIL/uL (ref 3.87–5.11)
RDW: 16.1 % — ABNORMAL HIGH (ref 11.5–15.5)
WBC Count: 9.4 10*3/uL (ref 4.0–10.5)
nRBC: 0 % (ref 0.0–0.2)

## 2021-01-31 ENCOUNTER — Other Ambulatory Visit: Payer: Self-pay | Admitting: Hematology and Oncology

## 2021-02-28 ENCOUNTER — Other Ambulatory Visit: Payer: Self-pay | Admitting: Hematology and Oncology

## 2021-02-28 DIAGNOSIS — I82623 Acute embolism and thrombosis of deep veins of upper extremity, bilateral: Secondary | ICD-10-CM

## 2021-02-28 DIAGNOSIS — I82622 Acute embolism and thrombosis of deep veins of left upper extremity: Secondary | ICD-10-CM

## 2021-02-28 DIAGNOSIS — I82629 Acute embolism and thrombosis of deep veins of unspecified upper extremity: Secondary | ICD-10-CM

## 2021-03-02 LAB — POCT INR: INR: 2.3 (ref 2.0–3.0)

## 2021-03-23 ENCOUNTER — Encounter: Payer: Self-pay | Admitting: Hematology and Oncology

## 2021-03-29 DIAGNOSIS — Z7901 Long term (current) use of anticoagulants: Secondary | ICD-10-CM | POA: Diagnosis not present

## 2021-03-29 DIAGNOSIS — D6851 Activated protein C resistance: Secondary | ICD-10-CM | POA: Diagnosis not present

## 2021-05-05 LAB — POCT INR: INR: 2.3 (ref 2.0–3.0)

## 2021-05-18 ENCOUNTER — Ambulatory Visit: Payer: PPO | Admitting: Cardiovascular Disease

## 2021-05-18 ENCOUNTER — Encounter: Payer: Self-pay | Admitting: Cardiovascular Disease

## 2021-05-18 ENCOUNTER — Other Ambulatory Visit: Payer: Self-pay

## 2021-05-18 VITALS — BP 136/84 | HR 66 | Ht 61.0 in | Wt 214.6 lb

## 2021-05-18 DIAGNOSIS — R002 Palpitations: Secondary | ICD-10-CM | POA: Diagnosis not present

## 2021-05-18 DIAGNOSIS — R0789 Other chest pain: Secondary | ICD-10-CM

## 2021-05-18 DIAGNOSIS — I10 Essential (primary) hypertension: Secondary | ICD-10-CM

## 2021-05-18 DIAGNOSIS — E785 Hyperlipidemia, unspecified: Secondary | ICD-10-CM | POA: Diagnosis not present

## 2021-05-18 MED ORDER — ATORVASTATIN CALCIUM 40 MG PO TABS
40.0000 mg | ORAL_TABLET | Freq: Every day | ORAL | 3 refills | Status: DC
Start: 2021-05-18 — End: 2021-09-07

## 2021-05-18 NOTE — Assessment & Plan Note (Signed)
History of palpitations on Toprol-XL 100 mg a day.  She did have an event monitor performed 02/11/2020 that showed occasional PACs, PVCs and short runs of SVT.  She has cut out caffeinated beverages.  She has palpitations on a daily basis.

## 2021-05-18 NOTE — Assessment & Plan Note (Signed)
History of atypical chest pain coronary calcium score 76 performed 02/25/2020.  Chest pain occurs several times a week and can last for hours at a time.

## 2021-05-18 NOTE — Assessment & Plan Note (Signed)
History of essential hypertension blood pressure measured today at 136/84.  She is on high-dose metoprolol.

## 2021-05-18 NOTE — Patient Instructions (Signed)
Medication Instructions:  STOP simvastatin Start Atorvastatin 40 mg daily   *If you need a refill on your cardiac medications before your next appointment, please call your pharmacy*   Lab Work: LIPID/LIVER 3 months   If you have labs (blood work) drawn today and your tests are completely normal, you will receive your results only by: [place] (if you have MyChart) OR A paper copy in the mail If you have any lab test that is abnormal or we need to change your treatment, we will call you to review the results.   Follow-Up: At [facility], you and your health needs are our priority.  As part of our continuing mission to provide you with exceptional heart care, we have created designated Provider Care Teams.  These Care Teams include your primary Cardiologist (physician) and Advanced Practice Providers (APPs -  Physician Assistants and Nurse Practitioners) who all work together to provide you with the care you need, when you need it.  We recommend signing up for the patient portal called "MyChart".  Sign up information is provided on this After Visit Summary.  MyChart is used to connect with patients for Virtual Visits (Telemedicine).  Patients are able to view lab/test results, encounter notes, upcoming appointments, etc.  Non-urgent messages can be sent to your provider as well.   To learn more about what you can do with MyChart, go to [email or web address].    Your next appointment:   6 month(s)  The format for your next appointment:   In Person  Provider:   You will see one of the following Advanced Practice Providers on your designated Care Team:   Sande Rives, PA-C Coletta Memos, FNP  Then, Dr.Berry will plan to see you again in 12 month(s).

## 2021-05-18 NOTE — Progress Notes (Signed)
05/18/2021 Jessica Reilly   Sep 05, 1961  [id number]  Primary Physician Sharilyn Sites, MD Primary Cardiologist: Lorretta Harp MD [name], [place]  HPI:  Jessica Reilly is a 60 y.o.  early overweight divorced Caucasian female mother of 2 daughters, grandmother of 2 grandsons referred by Dr. Hilma Favors as well as her previous cardiologist Dr. Doneta Public to be established my practice because of previously treated cardiovascular problems and proximity.  I last saw her in the office 04/27/2020. Her cardiovascular risk factor profile is only notable for treated hyperlipidemia.  She is never smoked.  She is not diabetic.  There is no family history.  She is never had a heart attack or stroke.  She does admit to having anxiety and panic attacks.  She is had breast cancer in the past with bilateral mastectomies, radiation and chemotherapy in 2009 and is currently cancer free.  She apparently has factor V Leiden deficiency and has had bilateral upper extremity DVTs currently on Coumadin oral anticoagulation followed by Dr. [name] in the past.  She had a net negative Myoview stress test October 2020 and echoes in the past that have been unremarkable as well.  She has had a Zio patch that showed PVCs, PACs, short runs of nonsustained VT and supraventricular tachycardia which I suspect she is on metoprolol.  She complains of increasing shortness of breath, constant chest pain for last several weeks which sounds noncardiac and increasing palpitations.   2D echo recently performed was essentially normal except for grade 1 diastolic dysfunction.  Event monitor showed occasional PACs, PVCs and short runs of SVT.  A coronary calcium score was 76, relatively low, suggesting absence of CAD.  She does get occasional atypical chest pain.  She also gets palpitations but does admit to drinking caffeine as well.  Since I saw her a year ago she continues to complain of palpitations.  She has cut out caffeine.  She gets  chest pain several days a week lasting hours at a time which did not sound ischemic.  Her last lipid profile year ago showed an LDL of 93 on simvastatin 20 mg a day, not at goal for secondary prevention given her elevated coronary calcium score.   Current Meds  Medication Sig   acetaminophen (TYLENOL) 500 MG tablet Take 1,000 mg by mouth as needed.   alprazolam (XANAX) 2 MG tablet Take 2 mg by mouth 2 (two) times daily as needed (May take a 1 mg during the day but always takes 2 mg at bedtime.). For anxiety   anastrozole (ARIMIDEX) 1 MG tablet TAKE 1 TABLET BY MOUTH DAILY.   atorvastatin (LIPITOR) 40 MG tablet Take 1 tablet (40 mg total) by mouth daily.   Calcium Carbonate-Vit D-Min (CALCIUM 600+D3 PLUS MINERALS) 600-800 MG-UNIT TABS Take by mouth.   docusate sodium (STOOL SOFTENER) 100 MG capsule Take 2 capsules (200 mg total) by mouth at bedtime.   esomeprazole (NEXIUM) 40 MG capsule Take 40 mg by mouth 2 (two) times daily before a meal.   fluticasone (FLONASE) 50 MCG/ACT nasal spray Place 2 sprays into the nose daily.    ibuprofen (ADVIL,MOTRIN) 800 MG tablet Take 800 mg by mouth as needed.    levocetirizine (XYZAL) 5 MG tablet TAKE ONE TABLET BY MOUTH AT BEDTIME.   Magnesium 400 MG TABS Take 1 tablet by mouth daily.   metoprolol (TOPROL-XL) 100 MG 24 hr tablet Take 100 mg by mouth daily.     omega-3 fish oil (MAXEPA)  1000 MG CAPS capsule Take 1 capsule (1,000 mg total) by mouth 2 (two) times daily.   warfarin (COUMADIN) 7.5 MG tablet TAKE 1 TABLET BY MOUTH DAILY EXCEPT 1/2 TABLET ON TUESDAY & THURSDAY.   [DISCONTINUED] simvastatin (ZOCOR) 20 MG tablet Take 20 mg by mouth daily.      Allergies  Allergen Reactions   Amoxicillin-Pot Clavulanate Rash   Ceftin Rash   Cefuroxime Rash   Cefuroxime Axetil Rash   Risperidone And Related Other (See Comments)    Has medical contraindications    Social History   Socioeconomic History   Marital status: Divorced    Spouse name: Not on file    Number of children: Not on file   Years of education: Not on file   Highest education level: Not on file  Occupational History   Not on file  Tobacco Use   Smoking status: Never   Smokeless tobacco: Never  Substance and Sexual Activity   Alcohol use: Yes    Alcohol/week: 0.0 standard drinks    Comment: Rarely.   Drug use: No   Sexual activity: Never    Birth control/protection: Surgical    Comment: partial hyst  Other Topics Concern   Not on file  Social History Narrative   Not on file   Social Determinants of Health   Financial Resource Strain: Not on file  Food Insecurity: Not on file  Transportation Needs: Not on file  Physical Activity: Not on file  Stress: Not on file  Social Connections: Not on file  Intimate Partner Violence: Not on file     Review of Systems: General: negative for chills, fever, night sweats or weight changes.  Cardiovascular: negative for chest pain, dyspnea on exertion, edema, orthopnea, palpitations, paroxysmal nocturnal dyspnea or shortness of breath Dermatological: negative for rash Respiratory: negative for cough or wheezing Urologic: negative for hematuria Abdominal: negative for nausea, vomiting, diarrhea, bright red blood per rectum, melena, or hematemesis Neurologic: negative for visual changes, syncope, or dizziness All other systems reviewed and are otherwise negative except as noted above.    Blood pressure 136/84, pulse 66, height '5\' 1"'$  (1.549 m), weight 214 lb 9.6 oz (97.3 kg), SpO2 95 %.  General appearance: alert and no distress Neck: no adenopathy, no carotid bruit, no JVD, supple, symmetrical, trachea midline, and thyroid not enlarged, symmetric, no tenderness/mass/nodules Lungs: clear to auscultation bilaterally Heart: regular rate and rhythm, S1, S2 normal, no murmur, click, rub or gallop Extremities: extremities normal, atraumatic, no cyanosis or edema Pulses: 2+ and symmetric Skin: Skin color, texture, turgor  normal. No rashes or lesions Neurologic: Grossly normal  EKG sinus rhythm at 66 with LVH voltage.  I personally reviewed this EKG.  ASSESSMENT AND PLAN:   Hypertension History of essential hypertension blood pressure measured today at 136/84.  She is on high-dose metoprolol.  Elevated cholesterol History of hyperlipidemia on simvastatin 20 mg a day with lipid profile performed 04/28/2020 revealing total cholesterol 165, LDL 93 and HDL 39, not at goal for secondary prevention given her coronary calcium score of 76.  Uncollected change her to atorvastatin 40 mg a day and we will recheck a lipid liver profile in 3 months.  Palpitations History of palpitations on Toprol-XL 100 mg a day.  She did have an event monitor performed 02/11/2020 that showed occasional PACs, PVCs and short runs of SVT.  She has cut out caffeinated beverages.  She has palpitations on a daily basis.  Atypical chest pain History of atypical chest  pain coronary calcium score 76 performed 02/25/2020.  Chest pain occurs several times a week and can last for hours at a time.     Lorretta Harp MD FACP,FACC,FAHA, [facility] 05/18/2021 12:21 PM

## 2021-05-18 NOTE — Assessment & Plan Note (Signed)
History of hyperlipidemia on simvastatin 20 mg a day with lipid profile performed 04/28/2020 revealing total cholesterol 165, LDL 93 and HDL 39, not at goal for secondary prevention given her coronary calcium score of 76.  Uncollected change her to atorvastatin 40 mg a day and we will recheck a lipid liver profile in 3 months.

## 2021-05-31 ENCOUNTER — Other Ambulatory Visit: Payer: Self-pay | Admitting: Hematology and Oncology

## 2021-05-31 DIAGNOSIS — I82623 Acute embolism and thrombosis of deep veins of upper extremity, bilateral: Secondary | ICD-10-CM

## 2021-05-31 DIAGNOSIS — I82629 Acute embolism and thrombosis of deep veins of unspecified upper extremity: Secondary | ICD-10-CM

## 2021-05-31 DIAGNOSIS — I82622 Acute embolism and thrombosis of deep veins of left upper extremity: Secondary | ICD-10-CM

## 2021-06-01 DIAGNOSIS — H6123 Impacted cerumen, bilateral: Secondary | ICD-10-CM | POA: Diagnosis not present

## 2021-06-08 ENCOUNTER — Encounter: Payer: Self-pay | Admitting: Hematology and Oncology

## 2021-06-08 LAB — POCT INR: INR: 2.4 (ref 2.0–3.0)

## 2021-06-20 ENCOUNTER — Telehealth: Payer: Self-pay

## 2021-06-20 NOTE — Telephone Encounter (Signed)
Pt called and LVM stating she received a VM from Mickel Baas, [place] with Dr Geralyn Flash office but was not sure when it was. Attempted to return pt's call and LVM for call back.

## 2021-06-21 DIAGNOSIS — F419 Anxiety disorder, unspecified: Secondary | ICD-10-CM | POA: Diagnosis not present

## 2021-06-21 DIAGNOSIS — I10 Essential (primary) hypertension: Secondary | ICD-10-CM | POA: Diagnosis not present

## 2021-06-21 DIAGNOSIS — Z1331 Encounter for screening for depression: Secondary | ICD-10-CM | POA: Diagnosis not present

## 2021-06-21 DIAGNOSIS — C50919 Malignant neoplasm of unspecified site of unspecified female breast: Secondary | ICD-10-CM | POA: Diagnosis not present

## 2021-06-21 DIAGNOSIS — Z6839 Body mass index (BMI) 39.0-39.9, adult: Secondary | ICD-10-CM | POA: Diagnosis not present

## 2021-06-21 DIAGNOSIS — Z0001 Encounter for general adult medical examination with abnormal findings: Secondary | ICD-10-CM | POA: Diagnosis not present

## 2021-06-21 DIAGNOSIS — R7309 Other abnormal glucose: Secondary | ICD-10-CM | POA: Diagnosis not present

## 2021-06-21 DIAGNOSIS — D682 Hereditary deficiency of other clotting factors: Secondary | ICD-10-CM | POA: Diagnosis not present

## 2021-06-21 DIAGNOSIS — M797 Fibromyalgia: Secondary | ICD-10-CM | POA: Diagnosis not present

## 2021-06-21 DIAGNOSIS — E785 Hyperlipidemia, unspecified: Secondary | ICD-10-CM | POA: Diagnosis not present

## 2021-06-21 DIAGNOSIS — M1991 Primary osteoarthritis, unspecified site: Secondary | ICD-10-CM | POA: Diagnosis not present

## 2021-06-21 DIAGNOSIS — Z1389 Encounter for screening for other disorder: Secondary | ICD-10-CM | POA: Diagnosis not present

## 2021-06-21 LAB — LIPID PANEL
Cholesterol: 110 (ref 0–200)
HDL: 34 — AB (ref 35–70)
LDL Cholesterol: 54
Triglycerides: 122 (ref 40–160)

## 2021-06-21 LAB — BASIC METABOLIC PANEL
BUN: 13 (ref 4–21)
Creatinine: 1 (ref 0.5–1.1)

## 2021-06-21 LAB — VITAMIN B12: Vitamin B-12: 246

## 2021-06-21 LAB — COMPREHENSIVE METABOLIC PANEL: Calcium: 9.9 (ref 8.7–10.7)

## 2021-06-21 LAB — TSH: TSH: 1.49 (ref 0.41–5.90)

## 2021-06-21 LAB — VITAMIN D 25 HYDROXY (VIT D DEFICIENCY, FRACTURES): Vit D, 25-Hydroxy: 53.3

## 2021-06-21 LAB — HEMOGLOBIN A1C: Hemoglobin A1C: 7.8

## 2021-06-22 DIAGNOSIS — R7309 Other abnormal glucose: Secondary | ICD-10-CM | POA: Diagnosis not present

## 2021-07-04 ENCOUNTER — Encounter (INDEPENDENT_AMBULATORY_CARE_PROVIDER_SITE_OTHER): Payer: Self-pay | Admitting: *Deleted

## 2021-07-07 ENCOUNTER — Encounter: Payer: Self-pay | Admitting: Adult Health

## 2021-07-11 ENCOUNTER — Other Ambulatory Visit: Payer: PPO | Admitting: Adult Health

## 2021-07-18 LAB — PROTIME-INR: INR: 2 — AB (ref 0.9–1.1)

## 2021-07-27 ENCOUNTER — Ambulatory Visit: Payer: PPO | Admitting: "Endocrinology

## 2021-07-27 ENCOUNTER — Encounter: Payer: Self-pay | Admitting: "Endocrinology

## 2021-07-27 ENCOUNTER — Other Ambulatory Visit: Payer: Self-pay

## 2021-07-27 VITALS — BP 126/82 | HR 76 | Ht 61.0 in | Wt 213.4 lb

## 2021-07-27 DIAGNOSIS — E669 Obesity, unspecified: Secondary | ICD-10-CM | POA: Diagnosis not present

## 2021-07-27 DIAGNOSIS — Z7984 Long term (current) use of oral hypoglycemic drugs: Secondary | ICD-10-CM

## 2021-07-27 DIAGNOSIS — I10 Essential (primary) hypertension: Secondary | ICD-10-CM

## 2021-07-27 DIAGNOSIS — Z6841 Body Mass Index (BMI) 40.0 and over, adult: Secondary | ICD-10-CM | POA: Diagnosis not present

## 2021-07-27 DIAGNOSIS — E1165 Type 2 diabetes mellitus with hyperglycemia: Secondary | ICD-10-CM

## 2021-07-27 DIAGNOSIS — E782 Mixed hyperlipidemia: Secondary | ICD-10-CM

## 2021-07-27 NOTE — Progress Notes (Signed)
Endocrinology Consult Note       07/27/2021, 12:27 PM   Subjective:    Patient ID: Jessica Reilly, female    DOB: [birth year].  Jessica Reilly is being seen in consultation for management of currently uncontrolled symptomatic diabetes requested by  Jessica Sites, MD.   Past Medical History:  Diagnosis Date   Abnormal Pap smear    Acid reflux    Bipolar disorder ([place]) 03/22/2013   Breast cancer ([place])    Breast disorder    cancer   Chronic anticoagulation 03/22/2013   Coagulopathy ([place]) 04/10/2012   Constipation    DVT of upper extremity (deep vein thrombosis) ([place]) 04/10/2012   Factor V Leiden ([place])    Factor V Leiden mutation ([place]) 04/10/2012   Family history of ovarian cancer    Fatty liver 06/04/2017   Fibroid    Gallstones    Heart murmur    High cholesterol    History of abnormal cervical Pap smear 01/06/2015   History of breast cancer 01/06/2015   HTN (hypertension)    HX: breast cancer    Hypertension 12/31/2012   Kidney stones    Kidney stones 06/04/2017   Lobular carcinoma of breast, estrogen receptor positive, 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   Mental disorder    panic attacks   Osteoarthritis    Pain in both knees    Vaginal Pap smear, abnormal     Past Surgical History:  Procedure Laterality Date   BREAST LUMPECTOMY     COLONOSCOPY WITH ESOPHAGOGASTRODUODENOSCOPY (EGD) N/A 07/23/2013   Procedure: COLONOSCOPY WITH ESOPHAGOGASTRODUODENOSCOPY (EGD);  Surgeon: Rogene Houston, MD;  Location: [facility];  Service: Endoscopy;  Laterality: N/A;  125   LYMPH NODE BIOPSY     MASTECTOMY  bilateral   ovaries removed     TONSILLECTOMY AND ADENOIDECTOMY      Social History   Socioeconomic History   Marital status: Divorced    Spouse name: Not on file   Number of children: Not on file   Years of education: Not on file   Highest education level: Not on file  Occupational History   Not on file   Tobacco Use   Smoking status: Never   Smokeless tobacco: Never  Substance and Sexual Activity   Alcohol use: Yes    Alcohol/week: 0.0 standard drinks    Comment: Rarely.   Drug use: No   Sexual activity: Never    Birth control/protection: Surgical    Comment: partial hyst  Other Topics Concern   Not on file  Social History Narrative   Not on file   Social Determinants of Health   Financial Resource Strain: Not on file  Food Insecurity: Not on file  Transportation Needs: Not on file  Physical Activity: Not on file  Stress: Not on file  Social Connections: Not on file    Family History  Problem Relation Age of Onset   Hypertension Mother    Alcohol abuse Mother    COPD Mother    Hypertension Father    Alcohol abuse Father    Heart disease Father    Pneumonia Father  Diabetes Father    Colon cancer Father        Dx 8s; deceased 32s   Healthy Sister    Cancer Brother        leukemia 61s; deceased 41   Heart disease Brother    Cancer Brother        liver; currently 69   Cirrhosis Brother    Other Brother        waiting for liver transplant   Cancer Maternal Uncle        3 of 6 with various cancers: skin, NHL, Hodkin's Dx   Ovarian cancer Maternal Grandmother 51       deceased 69   Alzheimer's disease Paternal 30    OCD Daughter    Bipolar disorder Daughter    Hypertension Daughter    Schizophrenia Daughter    Schizophrenia Daughter    Bipolar disorder Daughter    OCD Daughter    Hypertension Daughter    COPD Daughter    Other Daughter        stomach don't empty out   ADD / ADHD Grandchild    Anxiety disorder Grandchild    Depression Grandchild    Breast cancer Cousin 39       mat first cousin related through unaffected aunt    Outpatient Encounter Medications as of 07/27/2021  Medication Sig   promethazine (PHENERGAN) 25 MG tablet Take 25 mg by mouth as needed for nausea.   acetaminophen (TYLENOL) 500 MG tablet Take 1,000 mg by mouth  as needed.   alprazolam (XANAX) 2 MG tablet Take 2 mg by mouth 2 (two) times daily as needed (May take a 1 mg during the day but always takes 2 mg at bedtime.). For anxiety   anastrozole (ARIMIDEX) 1 MG tablet TAKE 1 TABLET BY MOUTH DAILY.   atorvastatin (LIPITOR) 40 MG tablet Take 1 tablet (40 mg total) by mouth daily.   Calcium Carbonate-Vit D-Min (CALCIUM 600+D3 PLUS MINERALS) 600-800 MG-UNIT TABS Take by mouth.   cyanocobalamin (,VITAMIN B-12,) 1000 MCG/ML injection Inject 100 mcg into the muscle every 30 (thirty) days.  (Patient not taking: Reported on 05/18/2021)   docusate sodium (STOOL SOFTENER) 100 MG capsule Take 2 capsules (200 mg total) by mouth at bedtime.   esomeprazole (NEXIUM) 40 MG capsule Take 40 mg by mouth 2 (two) times daily before a meal.   fluticasone (FLONASE) 50 MCG/ACT nasal spray Place 2 sprays into the nose daily.    ibuprofen (ADVIL,MOTRIN) 800 MG tablet Take 800 mg by mouth as needed.    levocetirizine (XYZAL) 5 MG tablet TAKE ONE TABLET BY MOUTH AT BEDTIME.   lidocaine (LIDODERM) 5 % Place 1 patch onto the skin as needed. Remove & Discard patch within 12 hours or as directed by MD (only uses prn) (Patient not taking: Reported on 05/18/2021)   Magnesium 400 MG TABS Take 1 tablet by mouth daily.   methocarbamol (ROBAXIN) 500 MG tablet Take 1 tablet (500 mg total) by mouth every 6 (six) hours as needed for muscle spasms. (Patient not taking: Reported on 05/18/2021)   metoprolol (TOPROL-XL) 100 MG 24 hr tablet Take 100 mg by mouth daily.     omega-3 fish oil (MAXEPA) 1000 MG CAPS capsule Take 1 capsule (1,000 mg total) by mouth 2 (two) times daily.   tiZANidine (ZANAFLEX) 4 MG capsule Take 1 capsule (4 mg total) by mouth at bedtime. (Patient not taking: Reported on 05/18/2021)   warfarin (COUMADIN) 7.5 MG tablet TAKE 1  TABLET BY MOUTH DAILY EXCEPT 1/2 TABLET ON TUESDAY & THURSDAY.   No facility-administered encounter medications on file as of 07/27/2021.     ALLERGIES: Allergies  Allergen Reactions   Amoxicillin-Pot Clavulanate Rash   Ceftin Rash   Cefuroxime Rash   Cefuroxime Axetil Rash   Risperidone And Related Other (See Comments)    Has medical contraindications    VACCINATION STATUS:  There is no immunization history on file for this patient.  Diabetes She presents for her initial diabetic visit. She has type 2 diabetes mellitus. Onset time: She was diagnosed at approximate age of 60 years old. Her disease course has been worsening. There are no hypoglycemic associated symptoms. Pertinent negatives for hypoglycemia include no confusion, headaches, pallor or seizures. Associated symptoms include fatigue, polydipsia and polyuria. Pertinent negatives for diabetes include no chest pain and no polyphagia. There are no hypoglycemic complications. Symptoms are worsening. There are no diabetic complications. Risk factors for coronary artery disease include diabetes mellitus, dyslipidemia, obesity, post-menopausal and sedentary lifestyle. When asked about current treatments, none were reported. Her weight is fluctuating minimally. She is following a generally unhealthy diet. When asked about meal planning, she reported none. She has not had a previous visit with a dietitian. (She does not monitor blood glucose regularly.  Her recent A1c was 7.8% increasing from 6.5% from May 2021.) An ACE inhibitor/angiotensin II receptor blocker is not being taken.  Hyperlipidemia This is a chronic problem. The current episode started more than 1 year ago. The problem is uncontrolled. Exacerbating diseases include diabetes and obesity. Pertinent negatives include no chest pain, myalgias or shortness of breath. Current antihyperlipidemic treatment includes statins. Risk factors for coronary artery disease include dyslipidemia, diabetes mellitus, obesity, a sedentary lifestyle and post-menopausal.    Review of Systems  Constitutional:  Positive for fatigue.  Negative for chills, fever and unexpected weight change.  HENT:  Negative for trouble swallowing and voice change.   Eyes:  Negative for visual disturbance.  Respiratory:  Negative for cough, shortness of breath and wheezing.   Cardiovascular:  Negative for chest pain, palpitations and leg swelling.  Gastrointestinal:  Negative for diarrhea, nausea and vomiting.  Endocrine: Positive for polydipsia and polyuria. Negative for cold intolerance, heat intolerance and polyphagia.  Musculoskeletal:  Negative for arthralgias and myalgias.  Skin:  Negative for color change, pallor, rash and wound.  Neurological:  Negative for seizures and headaches.  Psychiatric/Behavioral:  Negative for confusion and suicidal ideas.    Objective:    Vitals with BMI 07/27/2021 05/18/2021 01/20/2021  Height 5\' 1"  5\' 1"  5\' 1"   Weight 213 lbs 6 oz 214 lbs 10 oz 216 lbs 6 oz  BMI 40.34 10.93 23.55  Systolic 732 202 542  Diastolic 82 84 89  Pulse 76 66 71  Some encounter information is confidential and restricted. Go to Review Flowsheets activity to see all data.    BP 126/82   Pulse 76   Ht 5\' 1"  (1.549 m)   Wt 213 lb 6.4 oz (96.8 kg)   BMI 40.32 kg/m   Wt Readings from Last 3 Encounters:  07/27/21 213 lb 6.4 oz (96.8 kg)  05/18/21 214 lb 9.6 oz (97.3 kg)  01/20/21 216 lb 6.4 oz (98.2 kg)     Physical Exam Constitutional:      Appearance: She is well-developed.  HENT:     Head: Normocephalic and atraumatic.  Neck:     Thyroid: No thyromegaly.     Trachea: No tracheal deviation.  Cardiovascular:     Rate and Rhythm: Normal rate and regular rhythm.  Pulmonary:     Effort: Pulmonary effort is normal.     Breath sounds: Normal breath sounds.  Abdominal:     General: Bowel sounds are normal.     Palpations: Abdomen is soft.     Tenderness: There is no abdominal tenderness. There is no guarding.  Musculoskeletal:        General: Normal range of motion.     Cervical back: Normal range of motion and  neck supple.  Skin:    General: Skin is warm and dry.     Coloration: Skin is not pale.     Findings: No erythema or rash.     Comments: +Acanthosis nigricans.  Neurological:     Mental Status: She is alert and oriented to person, place, and time.     Cranial Nerves: No cranial nerve deficit.     Coordination: Coordination normal.     Deep Tendon Reflexes: Reflexes are normal and symmetric.  Psychiatric:        Judgment: Judgment normal.    CMP ( most recent) CMP     Component Value Date/Time   NA 139 01/20/2021 1111   NA 142 05/25/2017 1345   NA 142 02/20/2013 1342   K 4.7 01/20/2021 1111   K 4.4 02/20/2013 1342   CL 102 01/20/2021 1111   CL 105 02/20/2013 1342   CO2 27 01/20/2021 1111   CO2 26 02/20/2013 1342   GLUCOSE 133 (H) 01/20/2021 1111   GLUCOSE 107 (H) 02/20/2013 1342   BUN 13 06/21/2021 0000   BUN 10.0 02/20/2013 1342   CREATININE 1.0 06/21/2021 0000   CREATININE 1.06 (H) 01/20/2021 1111   CREATININE 1.06 06/22/2014 1043   CREATININE 1.2 (H) 02/20/2013 1342   CALCIUM 9.9 06/21/2021 0000   CALCIUM 9.5 02/20/2013 1342   PROT 7.9 01/20/2021 1111   PROT 7.1 02/04/2020 0937   PROT 7.6 02/20/2013 1342   ALBUMIN 4.0 01/20/2021 1111   ALBUMIN 4.2 02/04/2020 0937   ALBUMIN 3.6 02/20/2013 1342   AST 33 01/20/2021 1111   AST 29 02/20/2013 1342   ALT 27 01/20/2021 1111   ALT 33 02/20/2013 1342   ALKPHOS 88 01/20/2021 1111   ALKPHOS 100 02/20/2013 1342   BILITOT 0.4 01/20/2021 1111   BILITOT 0.37 02/20/2013 1342   GFRNONAA 59 (L) 01/20/2021 1111   GFRAA 69 05/25/2017 1345    Diabetic Labs (most recent): Lab Results  Component Value Date   HGBA1C 7.8 06/21/2021     Lipid Panel ( most recent) Lipid Panel     Component Value Date/Time   CHOL 110 06/21/2021 0000   CHOL 129 02/04/2020 0937   TRIG 122 06/21/2021 0000   HDL 34 (A) 06/21/2021 0000   HDL 37 (L) 02/04/2020 0937   CHOLHDL 3.5 02/04/2020 0937   CHOLHDL 4.0 12/31/2012 1044   VLDL 28  12/31/2012 1044   LDLCALC 54 06/21/2021 0000   LDLCALC 65 02/04/2020 0937   LABVLDL 27 02/04/2020 0937        Results for SHINITA, MAC (MRN [id number]) as of 07/27/2021 12:37  Ref. Range 12/31/2012 10:44 05/25/2017 13:45 06/21/2021 00:00  TSH Latest Ref Range: 0.41 - 5.90  1.572 1.360 1.49    Assessment & Plan:   1. Poorly controlled type 2 diabetes mellitus ([place])   - Adrena H Reller has currently uncontrolled symptomatic type 2 DM since  60 years of age,  with  most recent A1c of 7.8 %. Recent labs reviewed. - I had a long discussion with her about the progressive nature of diabetes and the pathology behind its complications. -her diabetes is complicated by obesity/sedentary life and she remains at a high risk for more acute and chronic complications which include CAD, CVA, CKD, retinopathy, and neuropathy. These are all discussed in detail with her.  - I have counseled her on diet  and weight management  by adopting a carbohydrate restricted/protein rich diet. Patient is encouraged to switch to  unprocessed or minimally processed     complex starch and increased protein intake (animal or plant source), fruits, and vegetables. -  she is advised to stick to a routine mealtimes to eat 3 meals  a day and avoid unnecessary snacks ( to snack only to correct hypoglycemia).   - she acknowledges that there is a room for improvement in her food and drink choices. - Suggestion is made for her to avoid simple carbohydrates  from her diet including Cakes, Sweet Desserts, Ice Cream, Soda (diet and regular), Sweet Tea, Candies, Chips, Cookies, Store Bought Juices, Alcohol in Excess of  1-2 drinks a day, Artificial Sweeteners,  Coffee Creamer, and "Sugar-free" Products. This will help patient to have more stable blood glucose profile and potentially avoid unintended weight gain.  - she will be scheduled with Jearld Fenton, RDN, CDE for diabetes education.  - I have approached her with the following  individualized plan to manage  her diabetes and patient agrees:   - she was never offered treatment for diabetes, and wishes to avoid medications.   -She will be given a chance until next measurement of A1c, approach for intensive lifestyle change.  -Plant predominant Whole Foods lifestyle nutrition is recommended discussed with her in detail, in addition to the dietary advice given above.  - she is encouraged to call clinic for blood glucose levels less than 70 or above 300 mg /dl. -She will be considered for at least metformin intervention if her next visit A1c is about 7%. -She has several options including GLP-1 receptor agonists, metformin, etc. However, the best option is for her to intensify lifestyle change, to see if she can avoid medications. - Specific targets for  A1c;  LDL, HDL,  and Triglycerides were discussed with the patient.  2) Blood Pressure /Hypertension:  her blood pressure is  controlled to target.  She is not on anti-tensive medications for now.  The dietary plan discussed above will also help control blood pressure.     3) Lipids/Hyperlipidemia:   Review of her recent lipid panel showed  controlled  LDL at 54 .  she  is advised to continue    atorvastatin 40 mg daily at bedtime.  Side effects and precautions discussed with her.  4)  Weight/Diet:  Body mass index is 40.32 kg/m.  -   clearly complicating her diabetes care.   she is  a candidate for weight loss. I discussed with her the fact that loss of 5 - 10% of her  current body weight will have the most impact on her diabetes management.  Exercise, and detailed carbohydrates information provided  -  detailed on discharge instructions.  5) Chronic Care/Health Maintenance:  -she  is Statin medications and  is encouraged to initiate and continue to follow up with Ophthalmology, Dentist,  Podiatrist at least yearly or according to recommendations, and advised to   stay away from smoking. I have recommended yearly flu  vaccine and  pneumonia vaccine at least every 5 years; moderate intensity exercise for up to 150 minutes weekly; and  sleep for at least 7 hours a day.  - she is  advised to maintain close follow up with Jessica Sites, MD for primary care needs, as well as her other providers for optimal and coordinated care.   I spent 66 minutes in the care of the patient today including review of labs from [place], Lipids, Thyroid Function, Hematology (current and previous including abstractions from other facilities); face-to-face time discussing  her blood glucose readings/logs, discussing hypoglycemia and hyperglycemia episodes and symptoms, medications doses, her options of short and long term treatment based on the latest standards of care / guidelines;  discussion about incorporating lifestyle medicine;  and documenting the encounter.     Please refer to Patient Instructions for Blood Glucose Monitoring and Insulin/Medications Dosing Guide"  in media tab for additional information. Please  also refer to " Patient Self Inventory" in the Media  tab for reviewed elements of pertinent patient history.  Derry Skill participated in the discussions, expressed understanding, and voiced agreement with the above plans.  All questions were answered to her satisfaction. she is encouraged to contact clinic should she have any questions or concerns prior to her return visit.   Follow up plan: - Return in about 8 weeks (around 09/21/2021) for A1c -NV.  Glade Lloyd, MD [facility] [address] [place], [place] [address] Phone: [phone]  Fax: [phone]    07/27/2021, 12:27 PM  This note was partially dictated with voice recognition software. Similar sounding words can be transcribed inadequately or may not  be corrected upon review.

## 2021-07-27 NOTE — Patient Instructions (Signed)

## 2021-08-02 DIAGNOSIS — Z9221 Personal history of antineoplastic chemotherapy: Secondary | ICD-10-CM | POA: Diagnosis not present

## 2021-08-02 DIAGNOSIS — C50919 Malignant neoplasm of unspecified site of unspecified female breast: Secondary | ICD-10-CM | POA: Diagnosis not present

## 2021-08-02 DIAGNOSIS — Z08 Encounter for follow-up examination after completed treatment for malignant neoplasm: Secondary | ICD-10-CM | POA: Diagnosis not present

## 2021-08-02 DIAGNOSIS — Z853 Personal history of malignant neoplasm of breast: Secondary | ICD-10-CM | POA: Diagnosis not present

## 2021-08-05 DIAGNOSIS — C50012 Malignant neoplasm of nipple and areola, left female breast: Secondary | ICD-10-CM | POA: Diagnosis not present

## 2021-08-05 DIAGNOSIS — C50011 Malignant neoplasm of nipple and areola, right female breast: Secondary | ICD-10-CM | POA: Diagnosis not present

## 2021-08-09 ENCOUNTER — Telehealth: Payer: Self-pay | Admitting: "Endocrinology

## 2021-08-09 NOTE — Telephone Encounter (Signed)
Pt did not answer, lvm with that information.

## 2021-08-09 NOTE — Telephone Encounter (Signed)
Pt is calling and was seen as new patient on 10/19. She states she was not told how many times a day she needs to be testing her sugar and states she does not have a meter or testing supplies to be testing. Please advise.    [place], [place] Phone:  [phone]  Fax:  [phone]

## 2021-08-24 ENCOUNTER — Other Ambulatory Visit: Payer: PPO | Admitting: Adult Health

## 2021-08-29 DIAGNOSIS — Z7901 Long term (current) use of anticoagulants: Secondary | ICD-10-CM | POA: Diagnosis not present

## 2021-08-29 DIAGNOSIS — D6851 Activated protein C resistance: Secondary | ICD-10-CM | POA: Diagnosis not present

## 2021-08-30 LAB — PROTIME-INR: INR: 2 — AB (ref 0.9–1.1)

## 2021-08-31 ENCOUNTER — Other Ambulatory Visit: Payer: Self-pay | Admitting: Hematology and Oncology

## 2021-08-31 DIAGNOSIS — I82622 Acute embolism and thrombosis of deep veins of left upper extremity: Secondary | ICD-10-CM

## 2021-08-31 DIAGNOSIS — I82629 Acute embolism and thrombosis of deep veins of unspecified upper extremity: Secondary | ICD-10-CM

## 2021-08-31 DIAGNOSIS — I82623 Acute embolism and thrombosis of deep veins of upper extremity, bilateral: Secondary | ICD-10-CM

## 2021-09-05 ENCOUNTER — Other Ambulatory Visit: Payer: Self-pay

## 2021-09-05 DIAGNOSIS — E785 Hyperlipidemia, unspecified: Secondary | ICD-10-CM

## 2021-09-06 LAB — LIPID PANEL
Chol/HDL Ratio: 3.1 ratio (ref 0.0–4.4)
Cholesterol, Total: 117 mg/dL (ref 100–199)
HDL: 38 mg/dL — ABNORMAL LOW (ref >39)
LDL Chol Calc (NIH): 58 mg/dL (ref 0–99)
Triglycerides: 112 mg/dL (ref 0–149)
VLDL Cholesterol Cal: 21 mg/dL (ref 5–40)

## 2021-09-06 LAB — HEPATIC FUNCTION PANEL
ALT: 23 IU/L (ref 0–32)
AST: 25 IU/L (ref 0–40)
Albumin: 4.2 g/dL (ref 3.8–4.8)
Alkaline Phosphatase: 103 IU/L (ref 44–121)
Bilirubin Total: 0.3 mg/dL (ref 0.0–1.2)
Bilirubin, Direct: 0.1 mg/dL (ref 0.00–0.40)
Total Protein: 7.3 g/dL (ref 6.0–8.5)

## 2021-09-07 ENCOUNTER — Ambulatory Visit (INDEPENDENT_AMBULATORY_CARE_PROVIDER_SITE_OTHER): Payer: PPO | Admitting: Adult Health

## 2021-09-07 ENCOUNTER — Other Ambulatory Visit (HOSPITAL_COMMUNITY)
Admission: RE | Admit: 2021-09-07 | Discharge: 2021-09-07 | Disposition: A | Discharge status: Home / Self Care | Payer: PPO | Source: Ambulatory Visit | Attending: Adult Health | Admitting: Adult Health

## 2021-09-07 ENCOUNTER — Encounter: Payer: Self-pay | Admitting: Adult Health

## 2021-09-07 ENCOUNTER — Other Ambulatory Visit: Payer: Self-pay

## 2021-09-07 VITALS — BP 133/84 | HR 68 | Ht 62.25 in | Wt 215.5 lb

## 2021-09-07 DIAGNOSIS — Z01419 Encounter for gynecological examination (general) (routine) without abnormal findings: Secondary | ICD-10-CM

## 2021-09-07 DIAGNOSIS — Z1151 Encounter for screening for human papillomavirus (HPV): Secondary | ICD-10-CM | POA: Diagnosis not present

## 2021-09-07 DIAGNOSIS — Z853 Personal history of malignant neoplasm of breast: Secondary | ICD-10-CM

## 2021-09-07 DIAGNOSIS — Z1211 Encounter for screening for malignant neoplasm of colon: Secondary | ICD-10-CM

## 2021-09-07 LAB — HEMOCCULT GUIAC POC 1CARD (OFFICE): Fecal Occult Blood, POC: NEGATIVE

## 2021-09-07 NOTE — Progress Notes (Signed)
Patient ID: Jessica Reilly, female   DOB: 04/09/1961, 60 y.o.   MRN: [id number] History of Present Illness: Jessica Reilly is a 60 year old white female, divorced, [id number], in for well woman gyn exam and pap. She is sp double mastectomy and BSO, had breast cancer. PCP is Dr Hilma Favors.  Current Medications, Allergies, Past Medical History, Past Surgical History, Family History and Social History were reviewed in [employer] record.     Review of Systems: Patient denies any headaches, hearing loss, fatigue, blurred vision, shortness of breath, chest pain, abdominal pain, problems with bowel movements, urination, or intercourse.(Not having sex). No joint pain or mood swings.  Has shoulder pain   Physical Exam:BP 133/84 (BP Location: Left Arm, Patient Position: Sitting, Cuff Size: Large)   Pulse 68   Ht 5' 2.25" (1.581 m)   Wt 215 lb 8 oz (97.8 kg)   BMI 39.10 kg/m   General:  Well developed, well nourished, no acute distress Skin:  Warm and dry Neck:  Midline trachea, normal thyroid, good ROM, no lymphadenopathy,no carotid bruits heard Lungs; Clear to auscultation bilaterally Breast:  surgically absent, no masses on chest wall Cardiovascular: Regular rate and rhythm Abdomen:  Soft, non tender, no hepatosplenomegaly Pelvic:  External genitalia is normal in appearance, no lesions.  The vagina is pale with loss of rugae. Urethra has no lesions or masses. The cervix is smooth,pap with HR HPV genotyping performed.  Uterus is felt to be normal size, shape, and contour.  No adnexal masses or tenderness noted.Bladder is non tender, no masses felt. Rectal: Good sphincter tone, no polyps, or hemorrhoids felt.  Hemoccult negative. Extremities/musculoskeletal:  No swelling or varicosities noted, no clubbing or cyanosis Psych:  No mood changes, alert and cooperative,seems happy AA is 0 Fall risk is low Depression screen [employer] 2/9 09/07/2021 10/29/2018 05/25/2017  Decreased Interest 3 3 3    Down, Depressed, Hopeless 3 3 3   PHQ - 2 Score 6 6 6   Altered sleeping 3 3 3   Tired, decreased energy 3 3 3   Change in appetite 2 2 3   Feeling bad or failure about yourself  3 3 3   Trouble concentrating 1 3 3   Moving slowly or fidgety/restless 0 0 3  Suicidal thoughts 0 0 0  PHQ-9 Score 18 20 24   Difficult doing work/chores - Somewhat difficult -  Some recent data might be hidden    GAD 7 : Generalized Anxiety Score 09/07/2021  Nervous, Anxious, on Edge 3  Control/stop worrying 3  Worry too much - different things 3  Trouble relaxing 3  Restless 1  Easily annoyed or irritable 3  Afraid - awful might happen 3  Total GAD 7 Score 19  She sees PCP and has xanax.    [facility] - 09/07/21 1343       Pregnancy Intention Screening   Does the patient want to become pregnant in the next year? No    Does the patient's partner want to become pregnant in the next year? No    Would the patient like to discuss contraceptive options today? No      Contraception Wrap Up   Current Method Abstinence   postmenopausal   End Method No Method - Other Reason;Abstinence   postmenopausal   Contraception Counseling Provided No             Examination chaperoned by Levy Pupa LPN  Impression and Plan: 1. Encounter for gynecological examination with Papanicolaou smear of cervix Pap sent Pap in  3 years if normal Physical in 1 year Colonoscopy per GI Labs with PCP   2. Encounter for screening fecal occult blood testing   3. History of breast cancer Sp mastectomy and BSO in [place]

## 2021-09-12 ENCOUNTER — Telehealth: Payer: Self-pay | Admitting: Adult Health

## 2021-09-12 LAB — CYTOLOGY - PAP
Comment: NEGATIVE
Diagnosis: NEGATIVE
High risk HPV: NEGATIVE

## 2021-09-12 NOTE — Telephone Encounter (Signed)
Left message pap normal

## 2021-09-21 ENCOUNTER — Ambulatory Visit: Payer: PPO | Admitting: "Endocrinology

## 2021-10-11 ENCOUNTER — Other Ambulatory Visit: Payer: Self-pay

## 2021-10-25 DIAGNOSIS — E119 Type 2 diabetes mellitus without complications: Secondary | ICD-10-CM | POA: Diagnosis not present

## 2021-10-26 ENCOUNTER — Ambulatory Visit: Payer: PPO | Admitting: "Endocrinology

## 2021-11-01 ENCOUNTER — Telehealth (INDEPENDENT_AMBULATORY_CARE_PROVIDER_SITE_OTHER): Payer: Self-pay | Admitting: *Deleted

## 2021-11-01 ENCOUNTER — Encounter (INDEPENDENT_AMBULATORY_CARE_PROVIDER_SITE_OTHER): Payer: Self-pay | Admitting: *Deleted

## 2021-11-01 ENCOUNTER — Telehealth (INDEPENDENT_AMBULATORY_CARE_PROVIDER_SITE_OTHER): Payer: Self-pay

## 2021-11-01 MED ORDER — PEG 3350-KCL-NA BICARB-NACL 420 G PO SOLR: 4000.0000 mL | ORAL | 0 refills | Status: DC

## 2021-11-01 NOTE — Telephone Encounter (Addendum)
Referring MD/PCP: [place]  Procedure: tcs/egd  Reason/Indication:  hx polyps, fam hx colon ca, GERD  Has patient had this procedure before?  Yes, 10/214  If so, when, by whom and where?    Is there a family history of colon cancer?  Yes, father   Who?  What age when diagnosed?    Is patient diabetic? If yes, Type 1 or Type 2   no      Does patient have prosthetic heart valve or mechanical valve?  no  Do you have a pacemaker/defibrillator?  no  Has patient ever had endocarditis/atrial fibrillation? no  Does patient use oxygen? no  Has patient had joint replacement within last 12 months?  no  Is patient constipated or do they take laxatives? yes  Does patient have a history of alcohol/drug use?  no  Have you had a stroke/heart attack last 6 mths? no  Do you take medicine for weight loss?  no  For female patients,: have you had a hysterectomy                       are you post menopausal                       do you still have your menstrual cycle no  Is patient on blood thinner such as Coumadin, Plavix and/or Aspirin? yes  Medications: warfarin 7.5 mg 6 days and 1/2 tab 1 day, fluticasone 50 mg daily, levothyroxine 5 mg daily, xanax 2 mg prn, atorvastatin 40 mg daily, arimidex 1 mg daily, nexium 40 mg bid, toprol 100 mg daily, calcium 600 mg bid, omega 3 daily, stool softener daily, magnesium 400 mg daily   Allergies: amoxicillin, ceftin, cefuroxime, risperidone  Medication Adjustment per Dr Rehman/Dr Jenetta Downer:  warfarin 3 days per by Dr Lindi Adie  Procedure date & time: 11/23/21

## 2021-11-01 NOTE — Telephone Encounter (Signed)
Patient needs trilyte 

## 2021-11-01 NOTE — Telephone Encounter (Signed)
Per Dr Lindi Adie it is ok for patient to stop Warfarin 3 days prior to TCS and resume day after procedure - patient aware

## 2021-11-01 NOTE — Telephone Encounter (Signed)
Jessica Reilly, CMA  ?

## 2021-11-01 NOTE — Telephone Encounter (Signed)
Error   This encounter was created in error - please disregard. 

## 2021-11-03 ENCOUNTER — Other Ambulatory Visit (INDEPENDENT_AMBULATORY_CARE_PROVIDER_SITE_OTHER): Payer: Self-pay

## 2021-11-03 DIAGNOSIS — Z86010 Personal history of colonic polyps: Secondary | ICD-10-CM

## 2021-11-08 LAB — PROTIME-INR: INR: 2 — AB (ref 0.80–1.20)

## 2021-11-09 ENCOUNTER — Ambulatory Visit: Payer: PPO | Admitting: Physician Assistant

## 2021-11-18 ENCOUNTER — Other Ambulatory Visit (HOSPITAL_COMMUNITY): Payer: PPO

## 2021-11-18 ENCOUNTER — Encounter (HOSPITAL_COMMUNITY): Payer: Self-pay

## 2021-11-21 ENCOUNTER — Encounter (HOSPITAL_COMMUNITY)
Admission: RE | Admit: 2021-11-21 | Discharge: 2021-11-21 | Disposition: A | Discharge status: Home / Self Care | Payer: PPO | Source: Ambulatory Visit | Attending: Internal Medicine | Admitting: Internal Medicine

## 2021-11-21 ENCOUNTER — Other Ambulatory Visit: Payer: Self-pay

## 2021-11-21 ENCOUNTER — Encounter (HOSPITAL_COMMUNITY): Payer: Self-pay

## 2021-11-21 DIAGNOSIS — Z86010 Personal history of colonic polyps: Secondary | ICD-10-CM

## 2021-11-23 ENCOUNTER — Ambulatory Visit (HOSPITAL_BASED_OUTPATIENT_CLINIC_OR_DEPARTMENT_OTHER): Payer: PPO | Admitting: Anesthesiology

## 2021-11-23 ENCOUNTER — Ambulatory Visit (HOSPITAL_COMMUNITY)
Admission: RE | Admit: 2021-11-23 | Discharge: 2021-11-23 | Disposition: A | Discharge status: Home / Self Care | Payer: PPO | Attending: Internal Medicine | Admitting: Internal Medicine

## 2021-11-23 ENCOUNTER — Encounter (HOSPITAL_COMMUNITY): Payer: Self-pay | Admitting: Internal Medicine

## 2021-11-23 ENCOUNTER — Ambulatory Visit (HOSPITAL_COMMUNITY): Payer: PPO | Admitting: Anesthesiology

## 2021-11-23 ENCOUNTER — Other Ambulatory Visit: Payer: Self-pay

## 2021-11-23 ENCOUNTER — Encounter (HOSPITAL_COMMUNITY)
Admission: RE | Disposition: A | Discharge status: Home / Self Care | Payer: Self-pay | Source: Home / Self Care | Attending: Internal Medicine

## 2021-11-23 DIAGNOSIS — K219 Gastro-esophageal reflux disease without esophagitis: Secondary | ICD-10-CM | POA: Insufficient documentation

## 2021-11-23 DIAGNOSIS — Z86718 Personal history of other venous thrombosis and embolism: Secondary | ICD-10-CM | POA: Diagnosis not present

## 2021-11-23 DIAGNOSIS — K2289 Other specified disease of esophagus: Secondary | ICD-10-CM | POA: Diagnosis not present

## 2021-11-23 DIAGNOSIS — K317 Polyp of stomach and duodenum: Secondary | ICD-10-CM | POA: Insufficient documentation

## 2021-11-23 DIAGNOSIS — I351 Nonrheumatic aortic (valve) insufficiency: Secondary | ICD-10-CM | POA: Diagnosis not present

## 2021-11-23 DIAGNOSIS — K259 Gastric ulcer, unspecified as acute or chronic, without hemorrhage or perforation: Secondary | ICD-10-CM | POA: Diagnosis not present

## 2021-11-23 DIAGNOSIS — Z09 Encounter for follow-up examination after completed treatment for conditions other than malignant neoplasm: Secondary | ICD-10-CM | POA: Diagnosis not present

## 2021-11-23 DIAGNOSIS — Z6839 Body mass index (BMI) 39.0-39.9, adult: Secondary | ICD-10-CM | POA: Insufficient documentation

## 2021-11-23 DIAGNOSIS — Z853 Personal history of malignant neoplasm of breast: Secondary | ICD-10-CM | POA: Insufficient documentation

## 2021-11-23 DIAGNOSIS — D125 Benign neoplasm of sigmoid colon: Secondary | ICD-10-CM | POA: Diagnosis not present

## 2021-11-23 DIAGNOSIS — D128 Benign neoplasm of rectum: Secondary | ICD-10-CM | POA: Insufficient documentation

## 2021-11-23 DIAGNOSIS — D123 Benign neoplasm of transverse colon: Secondary | ICD-10-CM | POA: Diagnosis not present

## 2021-11-23 DIAGNOSIS — Z86010 Personal history of colonic polyps: Secondary | ICD-10-CM | POA: Insufficient documentation

## 2021-11-23 DIAGNOSIS — D127 Benign neoplasm of rectosigmoid junction: Secondary | ICD-10-CM | POA: Diagnosis not present

## 2021-11-23 DIAGNOSIS — K760 Fatty (change of) liver, not elsewhere classified: Secondary | ICD-10-CM | POA: Insufficient documentation

## 2021-11-23 DIAGNOSIS — K621 Rectal polyp: Secondary | ICD-10-CM

## 2021-11-23 DIAGNOSIS — K635 Polyp of colon: Secondary | ICD-10-CM | POA: Diagnosis not present

## 2021-11-23 DIAGNOSIS — Z800 Family history of malignant neoplasm of digestive organs: Secondary | ICD-10-CM | POA: Diagnosis not present

## 2021-11-23 DIAGNOSIS — E7800 Pure hypercholesterolemia, unspecified: Secondary | ICD-10-CM | POA: Diagnosis not present

## 2021-11-23 DIAGNOSIS — D6851 Activated protein C resistance: Secondary | ICD-10-CM | POA: Diagnosis not present

## 2021-11-23 DIAGNOSIS — I10 Essential (primary) hypertension: Secondary | ICD-10-CM | POA: Insufficient documentation

## 2021-11-23 DIAGNOSIS — Z1211 Encounter for screening for malignant neoplasm of colon: Secondary | ICD-10-CM | POA: Insufficient documentation

## 2021-11-23 DIAGNOSIS — Z79899 Other long term (current) drug therapy: Secondary | ICD-10-CM | POA: Diagnosis not present

## 2021-11-23 DIAGNOSIS — E119 Type 2 diabetes mellitus without complications: Secondary | ICD-10-CM | POA: Insufficient documentation

## 2021-11-23 DIAGNOSIS — I82629 Acute embolism and thrombosis of deep veins of unspecified upper extremity: Secondary | ICD-10-CM

## 2021-11-23 DIAGNOSIS — I82622 Acute embolism and thrombosis of deep veins of left upper extremity: Secondary | ICD-10-CM

## 2021-11-23 DIAGNOSIS — Z7901 Long term (current) use of anticoagulants: Secondary | ICD-10-CM | POA: Insufficient documentation

## 2021-11-23 DIAGNOSIS — F319 Bipolar disorder, unspecified: Secondary | ICD-10-CM | POA: Diagnosis not present

## 2021-11-23 DIAGNOSIS — I82623 Acute embolism and thrombosis of deep veins of upper extremity, bilateral: Secondary | ICD-10-CM

## 2021-11-23 HISTORY — PX: COLONOSCOPY WITH PROPOFOL: SHX5780

## 2021-11-23 HISTORY — PX: BIOPSY: SHX5522

## 2021-11-23 HISTORY — PX: POLYPECTOMY: SHX5525

## 2021-11-23 HISTORY — PX: ESOPHAGOGASTRODUODENOSCOPY (EGD) WITH PROPOFOL: SHX5813

## 2021-11-23 LAB — HM COLONOSCOPY

## 2021-11-23 SURGERY — COLONOSCOPY WITH PROPOFOL
Anesthesia: General

## 2021-11-23 MED ORDER — LACTATED RINGERS IV SOLN: INTRAVENOUS | Status: DC

## 2021-11-23 MED ORDER — PROPOFOL 500 MG/50ML IV EMUL
INTRAVENOUS | Status: DC | PRN
  Administered 2021-11-23: 180 ug/kg/min via INTRAVENOUS

## 2021-11-23 MED ORDER — PROPOFOL 10 MG/ML IV BOLUS
INTRAVENOUS | Status: DC | PRN
Start: 2021-11-23 — End: 2021-11-23
  Administered 2021-11-23: 100 mg via INTRAVENOUS
  Administered 2021-11-23: 50 mg via INTRAVENOUS

## 2021-11-23 MED ORDER — PROPOFOL 500 MG/50ML IV EMUL
INTRAVENOUS | Status: AC
  Filled 2021-11-23: qty 50

## 2021-11-23 NOTE — Transfer of Care (Signed)
Immediate Anesthesia Transfer of Care Note  Patient: Jessica Reilly  Procedure(s) Performed: COLONOSCOPY WITH PROPOFOL ESOPHAGOGASTRODUODENOSCOPY (EGD) WITH PROPOFOL BIOPSY POLYPECTOMY  Patient Location: PACU  Anesthesia Type:General  Level of Consciousness: awake, alert  and oriented  Airway & Oxygen Therapy: Patient Spontanous Breathing and Patient connected to nasal cannula oxygen  Post-op Assessment: Report given to RN, Post -op Vital signs reviewed and stable, Patient moving all extremities X 4 and Patient able to stick tongue midline  Post vital signs: Reviewed  Last Vitals:  Vitals Value Taken Time  BP 99/63   Temp 98.6   Pulse 60   Resp 15   SpO2 90     Last Pain:  Vitals:   11/23/21 0932  TempSrc:   PainSc: 8          Complications: No notable events documented.

## 2021-11-23 NOTE — Anesthesia Preprocedure Evaluation (Addendum)
Anesthesia Evaluation  Patient identified by MRN, date of birth, ID band Patient awake    Reviewed: Allergy & Precautions, H&P , NPO status , Patient's Chart, lab work & pertinent test results, reviewed documented beta blocker date and time   Airway Mallampati: II  TM Distance: >3 FB Neck ROM: full    Dental no notable dental hx. (+) Loose   Pulmonary shortness of breath,    Pulmonary exam normal breath sounds clear to auscultation       Cardiovascular Exercise Tolerance: Good hypertension, + Valvular Problems/Murmurs  Rhythm:regular Rate:Normal     Neuro/Psych PSYCHIATRIC DISORDERS Anxiety Depression Bipolar Disorder negative neurological ROS     GI/Hepatic Neg liver ROS, GERD  Medicated,  Endo/Other  diabetes, Type 2Morbid obesity  Renal/GU Renal disease  negative genitourinary   Musculoskeletal  (+) Arthritis ,   Abdominal   Peds  Hematology negative hematology ROS (+)   Anesthesia Other Findings Bottom teeth loose.   1. Left ventricular ejection fraction, by estimation, is 60 to 65%. The  left ventricle has normal function. The left ventricle has no regional  wall motion abnormalities. Left ventricular diastolic parameters are  consistent with Grade I diastolic  dysfunction (impaired relaxation). The average left ventricular global  longitudinal strain is -22.2 %. The global longitudinal strain is normal.  2. Right ventricular systolic function is normal. The right ventricular  size is normal. There is normal pulmonary artery systolic pressure.  3. The mitral valve is normal in structure. Trivial mitral valve  regurgitation. No evidence of mitral stenosis.  4. The aortic valve is tricuspid. Aortic valve regurgitation is not  visualized. No aortic stenosis is present.  5. Aortic dilatation noted. 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  6. The inferior vena cava is normal in  size with greater than 50%  respiratory variability, suggesting right atrial pressure of 3 mmHg.   Reproductive/Obstetrics negative OB ROS                            Anesthesia Physical Anesthesia Plan  ASA: 3  Anesthesia Plan: General   Post-op Pain Management:    Induction:   PONV Risk Score and Plan: Propofol infusion  Airway Management Planned:   Additional Equipment:   Intra-op Plan:   Post-operative Plan:   Informed Consent: I have reviewed the patients History and Physical, chart, labs and discussed the procedure including the risks, benefits and alternatives for the proposed anesthesia with the patient or authorized representative who has indicated his/her understanding and acceptance.     Dental Advisory Given  Plan Discussed with: CRNA  Anesthesia Plan Comments:         Anesthesia Quick Evaluation

## 2021-11-23 NOTE — Op Note (Signed)
[facility] Patient Name: Jessica Reilly Procedure Date: 11/23/2021 9:15 AM MRN: [id number] Date of Birth: [birth year] Attending MD: Hildred Laser , MD CSN: [id number] Age: 61 Admit Type: Outpatient Procedure:                Upper GI endoscopy Indications:              Gastro-esophageal reflux disease, Follow-up of                            gastro-esophageal reflux disease Providers:                Hildred Laser, MD, Crystal Page, [place] Risa Grill, Technician Referring MD:             Halford Chessman MD, MD Medicines:                Propofol per Anesthesia Complications:            No immediate complications. Estimated Blood Loss:     Estimated blood loss was minimal. Procedure:                Pre-Anesthesia Assessment:                           - Prior to the procedure, a History and Physical                            was performed, and patient medications and                            allergies were reviewed. The patient's tolerance of                            previous anesthesia was also reviewed. The risks                            and benefits of the procedure and the sedation                            options and risks were discussed with the patient.                            All questions were answered, and informed consent                            was obtained. Prior Anticoagulants: The patient                            last took Coumadin (warfarin) 5 days prior to the                            procedure. ASA Grade Assessment: III - A patient  with severe systemic disease. After reviewing the                            risks and benefits, the patient was deemed in                            satisfactory condition to undergo the procedure.                           After obtaining informed consent, the endoscope was                            passed under direct vision. Throughout the                             procedure, the patient's blood pressure, pulse, and                            oxygen saturations were monitored continuously. The                            [id number]-[id number] ([id number]) scope was introduced through the                            mouth, and advanced to the second part of duodenum.                            The upper GI endoscopy was accomplished without                            difficulty. The patient tolerated the procedure                            well. Scope In: 9:35:03 AM Scope Out: 9:43:44 AM Total Procedure Duration: 0 hours 8 minutes 41 seconds  Findings:      The hypopharynx was normal.      The examined esophagus was normal.      The Z-line was irregular and was found 36 cm from the incisors.      Multiple 3 to 15 mm pedunculated and sessile polyps with no stigmata of       recent bleeding were found in the gastric fundus and in the gastric       body. Erosions noted involving some of these polyps.. Biopsies were       taken from 5 of these polyps with a cold forceps for histology. The       pathology specimen was placed into Bottle Number 1.      The gastric antrum was normal.      The duodenal bulb and second portion of the duodenum were normal. Impression:               - Normal hypopharynx.                           - Normal esophagus.                           -  Z-line irregular, 36 cm from the incisors.                           - Multiple gastric polyps. 5 were biopsied.                           - Normal antrum.                           - Normal duodenal bulb and second portion of the                            duodenum. Moderate Sedation:      Per Anesthesia Care Recommendation:           - Patient has a contact number available for                            emergencies. The signs and symptoms of potential                            delayed complications were discussed with the                            patient. Return to normal activities  tomorrow.                            Written discharge instructions were provided to the                            patient.                           - Resume previous diet today.                           - Continue present medications.                           - Await pathology results.                           - See the other procedure note for documentation of                            additional recommendations. Procedure Code(s):        --- Professional ---                           [phone], Esophagogastroduodenoscopy, flexible,                            transoral; with biopsy, single or multiple Diagnosis Code(s):        --- Professional ---                           K22.8, Other specified diseases of esophagus  K31.7, Polyp of stomach and duodenum                           K21.9, Gastro-esophageal reflux disease without                            esophagitis CPT copyright 2019 American Medical Association. All rights reserved. The codes documented in this report are preliminary and upon coder review may  be revised to meet current compliance requirements. Hildred Laser, MD Hildred Laser, MD 11/23/2021 10:19:55 AM This report has been signed electronically. Number of Addenda: 0

## 2021-11-23 NOTE — Op Note (Signed)
[facility] Patient Name: Jessica Reilly Procedure Date: 11/23/2021 9:45 AM MRN: [id number] Date of Birth: [birth year] Attending MD: Hildred Laser , MD CSN: [id number] Age: 61 Admit Type: Outpatient Procedure:                Colonoscopy Indications:              High risk colon cancer surveillance: Personal                            history of colonic polyps Providers:                Hildred Laser, MD, Charlsie Quest. Theda Sers RN, RN,                            Sara Lee, [place] Risa Grill, Technician Referring MD:             Halford Chessman, MD Medicines:                Propofol per Anesthesia Complications:            No immediate complications. Estimated Blood Loss:     Estimated blood loss was minimal. Procedure:                Pre-Anesthesia Assessment:                           - Prior to the procedure, a History and Physical                            was performed, and patient medications and                            allergies were reviewed. The patient's tolerance of                            previous anesthesia was also reviewed. The risks                            and benefits of the procedure and the sedation                            options and risks were discussed with the patient.                            All questions were answered, and informed consent                            was obtained. Prior Anticoagulants: The patient                            last took Coumadin (warfarin) 5 days prior to the                            procedure. ASA Grade Assessment: III - A patient  with severe systemic disease. After reviewing the                            risks and benefits, the patient was deemed in                            satisfactory condition to undergo the procedure.                           - Prior to the procedure, a History and Physical                            was performed, and patient medications and                             allergies were reviewed. The patient's tolerance of                            previous anesthesia was also reviewed. The risks                            and benefits of the procedure and the sedation                            options and risks were discussed with the patient.                            All questions were answered, and informed consent                            was obtained. ASA Grade Assessment: III - A patient                            with severe systemic disease. After reviewing the                            risks and benefits, the patient was deemed in                            satisfactory condition to undergo the procedure.                           After obtaining informed consent, the colonoscope                            was passed under direct vision. Throughout the                            procedure, the patient's blood pressure, pulse, and                            oxygen saturations were monitored continuously. The  [id number]-[id number] ([id number]) scope was introduced through                            the anus and advanced to the the cecum, identified                            by appendiceal orifice and ileocecal valve. The                            colonoscopy was performed without difficulty. The                            patient tolerated the procedure well. The quality                            of the bowel preparation was adequate. The                            ileocecal valve, appendiceal orifice, and rectum                            were photographed. Scope In: 9:47:24 AM Scope Out: 10:08:58 AM Scope Withdrawal Time: 0 hours 10 minutes 51 seconds  Total Procedure Duration: 0 hours 21 minutes 34 seconds  Findings:      The perianal and digital rectal examinations were normal.      Four polyps were found in the rectum, sigmoid colon and transverse       colon. The polyps were 3 to 7 mm in size. These polyps  were removed with       a cold snare. Resection and retrieval were complete. The pathology       specimen was placed into Bottle Number 1.      Anal papilla(e) were hypertrophied. Impression:               - Four 3 to 7 mm polyps in the rectum, in the                            sigmoid colon and in the transverse colon, removed                            with a cold snare. Resected and retrieved.                           - Anal papilla(e) were hypertrophied. Moderate Sedation:      Per Anesthesia Care Recommendation:           - Patient has a contact number available for                            emergencies. The signs and symptoms of potential                            delayed complications were discussed with the  patient. Return to normal activities tomorrow.                            Written discharge instructions were provided to the                            patient.                           - Resume previous diet today.                           - Continue present medications.                           - Resume Coumadin (warfarin) at prior dose tomorrow.                           - No aspirin, ibuprofen, naproxen, or other                            non-steroidal anti-inflammatory drugs.                           - Await pathology results.                           - Repeat colonoscopy in 5 years for surveillance. Procedure Code(s):        --- Professional ---                           [phone], Colonoscopy, flexible; with removal of                            tumor(s), polyp(s), or other lesion(s) by snare                            technique Diagnosis Code(s):        --- Professional ---                           Z86.010, Personal history of colonic polyps                           K62.1, Rectal polyp                           K63.5, Polyp of colon                           K62.89, Other specified diseases of anus and rectum CPT copyright 2019  American Medical Association. All rights reserved. The codes documented in this report are preliminary and upon coder review may  be revised to meet current compliance requirements. Hildred Laser, MD Hildred Laser, MD 11/23/2021 10:24:41 AM This report has been signed electronically. Number of Addenda: 0

## 2021-11-23 NOTE — Anesthesia Postprocedure Evaluation (Signed)
Anesthesia Post Note  Patient: Jessica Reilly  Procedure(s) Performed: COLONOSCOPY WITH PROPOFOL ESOPHAGOGASTRODUODENOSCOPY (EGD) WITH PROPOFOL BIOPSY POLYPECTOMY  Patient location during evaluation: Phase II Anesthesia Type: General Level of consciousness: awake Pain management: pain level controlled Vital Signs Assessment: post-procedure vital signs reviewed and stable Respiratory status: spontaneous breathing and respiratory function stable Cardiovascular status: blood pressure returned to baseline and stable Postop Assessment: no headache and no apparent nausea or vomiting Anesthetic complications: no Comments: Late entry   No notable events documented.   Last Vitals:  Vitals:   11/23/21 0835 11/23/21 1016  BP: (!) 157/90 99/63  Pulse: 82 71  Resp: (!) 22 (!) 26  Temp: 36.7 C 36.4 C  SpO2: 99% 90%    Last Pain:  Vitals:   11/23/21 1016  TempSrc: Axillary  PainSc: 0-No pain                 Louann Sjogren

## 2021-11-23 NOTE — Discharge Instructions (Addendum)
Resume warfarin on 11/24/2021.  Get INR checked in 7 to 10 days. Can take Tylenol up to 2 g/day in divided dose but please do not take ibuprofen or other NSAIDs since you are on warfarin. Resume other medications and diet as before. No driving for 24 hours. Physician will call with biopsy results and further recommendations.

## 2021-11-23 NOTE — H&P (Signed)
Jessica Reilly is an 61 y.o. female.   Chief Complaint: Patient is here for esophagogastroduodenoscopy followed by colonoscopy. HPI: Patient is 61 year old Caucasian female who has chronic GERD requiring high-dose PPI therapy(80 mg of omeprazole daily) as well as history of chronic adenoma who is here for diagnostic esophagogastroduodenoscopy and surveillance colonoscopy.  She denies abdominal pain change in bowel habits or rectal bleeding. Family history significant for colon carcinoma in her father in his 20s.  He died several years later of unrelated causes.  She states her mother also had colonoscopy but she is not sure as to the cause. Last warfarin dose was 5 days ago. She takes ibuprofen occasionally.  She is aware of the risks.  Past Medical History:  Diagnosis Date   Abnormal Pap smear    Acid reflux    Bipolar disorder ([place]) 03/22/2013   Breast cancer ([place])    Breast disorder    cancer   Chronic anticoagulation 03/22/2013   Coagulopathy ([place]) 04/10/2012   Constipation    DVT of upper extremity (deep vein thrombosis) ([place]) 04/10/2012   Factor V Leiden ([place])    Factor V Leiden mutation ([place]) 04/10/2012   Family history of ovarian cancer    Fatty liver 06/04/2017   Fibroid    Gallstones    Heart murmur    High cholesterol    History of abnormal cervical Pap smear 01/06/2015   History of breast cancer 01/06/2015   HTN (hypertension)    HX: breast cancer    Hypertension 12/31/2012   Lobular carcinoma of breast, estrogen receptor positive, 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   Mental disorder    panic attacks   Osteoarthritis    Pain in both knees    Vaginal Pap smear, abnormal     Past Surgical History:  Procedure Laterality Date   BREAST LUMPECTOMY     COLONOSCOPY WITH ESOPHAGOGASTRODUODENOSCOPY (EGD) N/A 07/23/2013   Procedure: COLONOSCOPY WITH ESOPHAGOGASTRODUODENOSCOPY (EGD);  Surgeon: Rogene Houston, MD;  Location: [facility];  Service: Endoscopy;  Laterality: N/A;  125    LYMPH NODE BIOPSY     MASTECTOMY  bilateral   ovaries removed     TONSILLECTOMY AND ADENOIDECTOMY      Family History  Problem Relation Age of Onset   Hypertension Mother    Alcohol abuse Mother    COPD Mother    Hypertension Father    Alcohol abuse Father    Heart disease Father    Pneumonia Father    Diabetes Father    Colon cancer Father        Dx 5s; deceased 68s   Healthy Sister    Cancer Brother        leukemia 10s; deceased 28   Heart disease Brother    Cancer Brother        liver; currently 89   Cirrhosis Brother    Other Brother        waiting for liver transplant   Cancer Maternal Uncle        3 of 6 with various cancers: skin, NHL, Hodkin's Dx   Ovarian cancer Maternal Grandmother 63       deceased 25   Alzheimer's disease Paternal 73    OCD Daughter    Bipolar disorder Daughter    Hypertension Daughter    Schizophrenia Daughter    Schizophrenia Daughter    Bipolar disorder Daughter    OCD Daughter    Hypertension Daughter    COPD Daughter  Other Daughter        stomach don't empty out   ADD / ADHD Grandchild    Anxiety disorder Grandchild    Depression Grandchild    Breast cancer Cousin 68       mat first cousin related through unaffected aunt   Social History:  reports that she has never smoked. She has never used smokeless tobacco. She reports that she does not currently use alcohol. She reports that she does not use drugs.  Allergies:  Allergies  Allergen Reactions   Amoxicillin-Pot Clavulanate Rash   Ceftin Rash   Cefuroxime Rash   Cefuroxime Axetil Rash   Risperidone And Related Other (See Comments)    Has medical contraindications    Medications Prior to Admission  Medication Sig Dispense Refill   acetaminophen (TYLENOL) 500 MG tablet Take 1,000 mg by mouth every 6 (six) hours as needed for moderate pain.     alprazolam (XANAX) 2 MG tablet Take 1-2 mg by mouth 2 (two) times daily as needed for anxiety.     anastrozole  (ARIMIDEX) 1 MG tablet TAKE 1 TABLET BY MOUTH DAILY. (Patient taking differently: Take 1 mg by mouth at bedtime.) 90 tablet 3   atorvastatin (LIPITOR) 40 MG tablet Take 40 mg by mouth daily.     Calcium Carbonate-Vit D-Min (CALCIUM 600+D3 PLUS MINERALS) 600-800 MG-UNIT TABS Take 2 tablets by mouth daily.     docusate sodium (STOOL SOFTENER) 100 MG capsule Take 2 capsules (200 mg total) by mouth at bedtime. 10 capsule 0   esomeprazole (NEXIUM) 40 MG capsule Take 40 mg by mouth 2 (two) times daily before a meal.     fluticasone (FLONASE) 50 MCG/ACT nasal spray Place 2 sprays into both nostrils at bedtime.     levocetirizine (XYZAL) 5 MG tablet TAKE ONE TABLET BY MOUTH AT BEDTIME. 30 tablet 3   Magnesium 400 MG TABS Take 400 mg by mouth daily.     metoprolol (TOPROL-XL) 100 MG 24 hr tablet Take 100 mg by mouth daily.       omega-3 fish oil (MAXEPA) 1000 MG CAPS capsule Take 1 capsule (1,000 mg total) by mouth 2 (two) times daily. 180 each 3   polyethylene glycol-electrolytes (TRILYTE) 420 g solution Take 4,000 mLs by mouth as directed. 4000 mL 0   promethazine (PHENERGAN) 25 MG tablet Take 25 mg by mouth every 6 (six) hours as needed for nausea or vomiting.     warfarin (COUMADIN) 7.5 MG tablet TAKE 1 TABLET BY MOUTH DAILY EXCEPT 1/2 TABLET ON TUESDAY & THURSDAY. (Patient taking differently: Take 7.5 mg by mouth daily.) 90 tablet 0   ibuprofen (ADVIL,MOTRIN) 800 MG tablet Take 800 mg by mouth every 8 (eight) hours as needed for moderate pain.      No results found for this or any previous visit (from the past 48 hour(s)). No results found.  Review of Systems  Blood pressure (!) 157/90, pulse 82, temperature 98 F (36.7 C), temperature source Oral, resp. rate (!) 22, height 5\' 3"  (1.6 m), weight 102.1 kg, SpO2 99 %. Physical Exam HENT:     Mouth/Throat:     Mouth: Mucous membranes are moist.     Pharynx: Oropharynx is clear.  Eyes:     Conjunctiva/sclera: Conjunctivae normal.   Cardiovascular:     Rate and Rhythm: Normal rate and regular rhythm.     Heart sounds: Normal heart sounds. No murmur heard. Pulmonary:     Effort: Pulmonary effort is normal.  Breath sounds: Normal breath sounds.  Abdominal:     General: There is no distension.     Palpations: Abdomen is soft. There is no mass.     Tenderness: There is no abdominal tenderness.  Musculoskeletal:        General: No swelling.     Cervical back: Neck supple.  Lymphadenopathy:     Cervical: No cervical adenopathy.  Skin:    General: Skin is warm and dry.  Neurological:     Mental Status: She is alert.     Assessment/Plan  Chronic GERD. History of colonic adenoma and family history of colon carcinoma in a first-degree relative. Diagnostic esophagogastroduodenoscopy and surveillance colonoscopy.  Hildred Laser, MD 11/23/2021, 9:27 AM

## 2021-11-23 NOTE — Progress Notes (Signed)
Patient removed three bilateral earrings and I removed four ankle bracelets. Patient refused to remove the four upper earrings bilaterally. Patient educated and aware of risks.

## 2021-11-24 ENCOUNTER — Encounter (INDEPENDENT_AMBULATORY_CARE_PROVIDER_SITE_OTHER): Payer: Self-pay | Admitting: *Deleted

## 2021-11-24 LAB — SURGICAL PATHOLOGY

## 2021-11-25 ENCOUNTER — Encounter (HOSPITAL_COMMUNITY): Payer: Self-pay | Admitting: Internal Medicine

## 2021-12-01 ENCOUNTER — Other Ambulatory Visit: Payer: Self-pay | Admitting: Hematology and Oncology

## 2021-12-01 DIAGNOSIS — I82622 Acute embolism and thrombosis of deep veins of left upper extremity: Secondary | ICD-10-CM

## 2021-12-01 DIAGNOSIS — I82623 Acute embolism and thrombosis of deep veins of upper extremity, bilateral: Secondary | ICD-10-CM

## 2021-12-01 DIAGNOSIS — I82629 Acute embolism and thrombosis of deep veins of unspecified upper extremity: Secondary | ICD-10-CM

## 2021-12-15 ENCOUNTER — Encounter: Payer: Self-pay | Admitting: Hematology and Oncology

## 2021-12-15 LAB — PROTIME-INR: INR: 2.5 — AB (ref 0.80–1.20)

## 2022-01-04 ENCOUNTER — Ambulatory Visit: Payer: PPO | Admitting: Internal Medicine

## 2022-01-04 ENCOUNTER — Encounter: Payer: Self-pay | Admitting: Internal Medicine

## 2022-01-04 ENCOUNTER — Other Ambulatory Visit: Payer: Self-pay

## 2022-01-04 VITALS — BP 126/80 | HR 74 | Ht 63.0 in | Wt 221.6 lb

## 2022-01-04 DIAGNOSIS — E1165 Type 2 diabetes mellitus with hyperglycemia: Secondary | ICD-10-CM

## 2022-01-04 DIAGNOSIS — E782 Mixed hyperlipidemia: Secondary | ICD-10-CM

## 2022-01-04 LAB — POCT GLUCOSE (DEVICE FOR HOME USE): Glucose Fasting, POC: 202 mg/dL — AB (ref 70–99)

## 2022-01-04 LAB — POCT GLYCOSYLATED HEMOGLOBIN (HGB A1C): Hemoglobin A1C: 8.9 % — AB (ref 4.0–5.6)

## 2022-01-04 MED ORDER — ONETOUCH VERIO VI STRP: 1.0000 | ORAL_STRIP | Freq: Every day | 3 refills | Status: AC

## 2022-01-04 MED ORDER — METFORMIN HCL ER 500 MG PO TB24
500.0000 mg | ORAL_TABLET | Freq: Every day | ORAL | 1 refills | Status: DC
Start: 2022-01-04 — End: 2022-01-05

## 2022-01-04 NOTE — Progress Notes (Signed)
?Name: Jessica Reilly  ?MRN/ DOB: [id number], Mar 01, 1961   ?Age/ Sex: 61 y.o., female   ? ?PCP: Sharilyn Sites, MD   ?Reason for Endocrinology Evaluation: Type 2 Diabetes Mellitus  ?   ?Date of Initial Endocrinology Visit: 01/04/2022   ? ? ?PATIENT IDENTIFIER: Jessica Reilly is a 61 y.o. female with a past medical history of HTN, fatty liver, T2DM, factor V Leiden mutation , history of breast CA and bipolar disorder. The patient presented for initial endocrinology clinic visit on 01/04/2022 for consultative assistance with her diabetes management.  ? ? ?HPI: ?Jessica Reilly was  ? ? ?Diagnosed with DM in 2022 ?Prior Medications tried/Intolerance: n/a  ?Currently checking blood sugars 0 x / day ?Hypoglycemia episodes : no               ?Hemoglobin A1c  peaking at 7.8% in 2022. ?Patient required assistance for hypoglycemia: no  ?Patient has required hospitalization within the last 1 year from hyper or hypoglycemia:  ? ?In terms of diet, the patient eats 1-2 meals a day, drinks rare regular soda  ? ? ?HOME DIABETES REGIMEN: ?N/A  ? ? ?Statin: Yes ?ACE-I/ARB: No ?Prior Diabetic Education: no  ? ? ?METER DOWNLOAD SUMMARY: Does not check  ? ?DIABETIC COMPLICATIONS: ?Microvascular complications:  ? ?Denies: retinopathy, neuropathy , CKD  ?Last eye exam: Completed 11/2020 ? ?Macrovascular complications:  ? ?Denies: CAD, PVD, CVA ? ? ?PAST HISTORY: ?Past Medical History:  ?Past Medical History:  ?Diagnosis Date  ? Abnormal Pap smear   ? Acid reflux   ? Bipolar disorder ([place]) 03/22/2013  ? Breast cancer ([place])   ? Breast disorder   ? cancer  ? Chronic anticoagulation 03/22/2013  ? Coagulopathy ([place]) 04/10/2012  ? Constipation   ? DVT of upper extremity (deep vein thrombosis) ([place]) 04/10/2012  ? Factor V Leiden ([place])   ? Factor V Leiden mutation ([place]) 04/10/2012  ? Family history of ovarian cancer   ? Fatty liver 06/04/2017  ? Fibroid   ? Gallstones   ? Heart murmur   ? High cholesterol   ? History of abnormal cervical Pap smear  01/06/2015  ? History of breast cancer 01/06/2015  ? HTN (hypertension)   ? HX: breast cancer   ? Hypertension 12/31/2012  ? Lobular carcinoma of breast, estrogen receptor positive, 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  ? Mental disorder   ? panic attacks  ? Osteoarthritis   ? Pain in both knees   ? Vaginal Pap smear, abnormal   ? ?Past Surgical History:  ?Past Surgical History:  ?Procedure Laterality Date  ? BIOPSY  11/23/2021  ? Procedure: BIOPSY;  Surgeon: Rogene Houston, MD;  [facility];  Service: Endoscopy;;  ? BREAST LUMPECTOMY    ? COLONOSCOPY WITH ESOPHAGOGASTRODUODENOSCOPY (EGD) N/A 07/23/2013  ? Procedure: COLONOSCOPY WITH ESOPHAGOGASTRODUODENOSCOPY (EGD);  Surgeon: Rogene Houston, MD;  [facility];  Service: Endoscopy;  Laterality: N/A;  125  ? COLONOSCOPY WITH PROPOFOL N/A 11/23/2021  ? Procedure: COLONOSCOPY WITH PROPOFOL;  Surgeon: Rogene Houston, MD;  [facility];  Service: Endoscopy;  Laterality: N/A;  855  ? ESOPHAGOGASTRODUODENOSCOPY (EGD) WITH PROPOFOL N/A 11/23/2021  ? Procedure: ESOPHAGOGASTRODUODENOSCOPY (EGD) WITH PROPOFOL;  Surgeon: Rogene Houston, MD;  [facility];  Service: Endoscopy;  Laterality: N/A;  ? LYMPH NODE BIOPSY    ? MASTECTOMY  bilateral  ? ovaries removed    ? POLYPECTOMY  11/23/2021  ? Procedure: POLYPECTOMY;  Surgeon: Laural Golden,  Mechele Dawley, MD;  Location: [facility];  Service: Endoscopy;;  ? TONSILLECTOMY AND ADENOIDECTOMY    ?  ?Social History:  reports that she has never smoked. She has never used smokeless tobacco. She reports that she does not currently use alcohol. She reports that she does not use drugs. ?Family History:  ?Family History  ?Problem Relation Age of Onset  ? Hypertension Mother   ? Alcohol abuse Mother   ? COPD Mother   ? Hypertension Father   ? Alcohol abuse Father   ? Heart disease Father   ? Pneumonia Father   ? Diabetes Father   ? Colon cancer Father   ?     Dx 48s; deceased 66s  ? Healthy Sister   ? Cancer Brother   ?      leukemia 73s; deceased 17  ? Heart disease Brother   ? Cancer Brother   ?     liver; currently 38  ? Cirrhosis Brother   ? Other Brother   ?     waiting for liver transplant  ? Cancer Maternal Uncle   ?     3 of 6 with various cancers: skin, NHL, Hodkin's Dx  ? Ovarian cancer Maternal Grandmother 42  ?     deceased 56  ? Alzheimer's disease Paternal Grandmother   ? OCD Daughter   ? Bipolar disorder Daughter   ? Hypertension Daughter   ? Schizophrenia Daughter   ? Schizophrenia Daughter   ? Bipolar disorder Daughter   ? OCD Daughter   ? Hypertension Daughter   ? COPD Daughter   ? Other Daughter   ?     stomach don't empty out  ? ADD / ADHD Grandchild   ? Anxiety disorder Grandchild   ? Depression Grandchild   ? Breast cancer Cousin 41  ?     mat first cousin related through unaffected aunt  ? ? ? ?HOME MEDICATIONS: ?Allergies as of 01/04/2022   ? ?   Reactions  ? Amoxicillin-pot Clavulanate Rash  ? Ceftin Rash  ? Cefuroxime Rash  ? Cefuroxime Axetil Rash  ? Risperidone And Related Other (See Comments)  ? Has medical contraindications  ? ?  ? ?  ?Medication List  ?  ? ?  ? Accurate as of January 04, 2022 10:08 AM. If you have any questions, ask your nurse or doctor.  ?  ?  ? ?  ? ?STOP taking these medications   ? ?simvastatin 40 MG tablet ?Commonly known as: ZOCOR ?Stopped by: Dorita Sciara, MD ?  ? ?  ? ?TAKE these medications   ? ?acetaminophen 500 MG tablet ?Commonly known as: TYLENOL ?Take 1,000 mg by mouth every 6 (six) hours as needed for moderate pain. ?  ?alprazolam 2 MG tablet ?Commonly known as: Duanne Moron ?Take 1-2 mg by mouth 2 (two) times daily as needed for anxiety. ?  ?anastrozole 1 MG tablet ?Commonly known as: ARIMIDEX ?TAKE 1 TABLET BY MOUTH DAILY. ?What changed: when to take this ?  ?atorvastatin 40 MG tablet ?Commonly known as: LIPITOR ?Take 40 mg by mouth daily. ?  ?Calcium 600+D3 Plus Minerals 600-800 MG-UNIT Tabs ?Take 2 tablets by mouth daily. ?  ?docusate sodium 100 MG capsule ?Commonly  known as: Stool Softener ?Take 2 capsules (200 mg total) by mouth at bedtime. ?  ?esomeprazole 40 MG capsule ?Commonly known as: [place] ?Take 20 mg by mouth 2 (two) times daily before a meal. ?  ?fluticasone 50 MCG/ACT nasal  spray ?Commonly known as: FLONASE ?Place 2 sprays into both nostrils at bedtime. ?  ?GaviLyte-G 236 g solution ?Generic drug: polyethylene glycol ?Take by mouth as directed. ?  ?levocetirizine 5 MG tablet ?Commonly known as: XYZAL ?TAKE ONE TABLET BY MOUTH AT BEDTIME. ?  ?Magnesium 400 MG Tabs ?Take 400 mg by mouth daily. ?  ?metoprolol succinate 100 MG 24 hr tablet ?Commonly known as: TOPROL-XL ?Take 100 mg by mouth daily. ?  ?omega-3 acid ethyl esters 1 g capsule ?Commonly known as: LOVAZA ?Take 1 capsule by mouth 2 (two) times daily. ?  ?omega-3 fish oil 1000 MG Caps capsule ?Commonly known as: MAXEPA ?Take 1 capsule (1,000 mg total) by mouth 2 (two) times daily. ?  ?OneTouch Verio test strip ?Generic drug: glucose blood ?1 each by Other route daily in the afternoon. Use as instructed ?Started by: Dorita Sciara, MD ?  ?promethazine 25 MG tablet ?Commonly known as: PHENERGAN ?Take 25 mg by mouth every 6 (six) hours as needed for nausea or vomiting. ?  ?warfarin 7.5 MG tablet ?Commonly known as: COUMADIN ?Take as directed by the anticoagulation clinic. If you are unsure how to take this medication, talk to your nurse or doctor. ?Original instructions: TAKE 1 TABLET BY MOUTH DAILY EXCEPT 1/2 TABLET ON TUESDAY & THURSDAY. ?  ? ?  ? ? ? ?ALLERGIES: ?Allergies  ?Allergen Reactions  ? Amoxicillin-Pot Clavulanate Rash  ? Ceftin Rash  ? Cefuroxime Rash  ? Cefuroxime Axetil Rash  ? Risperidone And Related Other (See Comments)  ?  Has medical contraindications  ? ? ? ?REVIEW OF SYSTEMS: ?A comprehensive ROS was conducted with the patient and is negative except as per HPI and below:  ?Review of Systems  ?Neurological:  Positive for tingling.  ?     Left leg tingling   ? ?  ?OBJECTIVE:  ? ?VITAL  SIGNS: BP 126/80 (BP Location: Left Arm, Patient Position: Sitting, Cuff Size: Large)   Pulse 74   Ht '5\' 3"'$  (1.6 m)   Wt 221 lb 9.6 oz (100.5 kg)   SpO2 95%   BMI 39.25 kg/m?   ? ?PHYSICAL EXAM:  ?General:

## 2022-01-04 NOTE — Patient Instructions (Signed)
Start Metformin 1 tablet daily with Breakfast for 1 week, then increase to 1 tablet with Breakfast and 1 tablet with Supper  ? ? ? ? ? ? ?Fasting goal 70-130 mg/dL  ? ? ? ? ?

## 2022-01-05 ENCOUNTER — Other Ambulatory Visit: Payer: Self-pay

## 2022-01-05 MED ORDER — ONETOUCH DELICA LANCETS 30G MISC: 2 refills | Status: AC

## 2022-01-05 MED ORDER — METFORMIN HCL ER 500 MG PO TB24: ORAL_TABLET | ORAL | 1 refills | Status: DC

## 2022-01-12 DIAGNOSIS — D6851 Activated protein C resistance: Secondary | ICD-10-CM | POA: Diagnosis not present

## 2022-01-12 DIAGNOSIS — Z7901 Long term (current) use of anticoagulants: Secondary | ICD-10-CM | POA: Diagnosis not present

## 2022-01-12 LAB — POCT INR: INR: 2.2 (ref 2.0–3.0)

## 2022-01-20 ENCOUNTER — Other Ambulatory Visit: Payer: PPO

## 2022-01-20 ENCOUNTER — Ambulatory Visit: Payer: PPO | Admitting: Hematology and Oncology

## 2022-01-23 ENCOUNTER — Other Ambulatory Visit: Payer: Self-pay | Admitting: *Deleted

## 2022-01-23 ENCOUNTER — Inpatient Hospital Stay: Payer: PPO | Attending: Hematology and Oncology

## 2022-01-23 ENCOUNTER — Inpatient Hospital Stay: Payer: PPO | Admitting: Hematology and Oncology

## 2022-01-23 ENCOUNTER — Other Ambulatory Visit: Payer: Self-pay

## 2022-01-23 ENCOUNTER — Ambulatory Visit: Payer: PPO | Admitting: Nurse Practitioner

## 2022-01-23 ENCOUNTER — Encounter: Payer: Self-pay | Admitting: Nurse Practitioner

## 2022-01-23 ENCOUNTER — Inpatient Hospital Stay: Payer: PPO

## 2022-01-23 VITALS — BP 155/80 | HR 77 | Temp 97.7°F | Resp 19 | Ht 62.0 in | Wt 217.9 lb

## 2022-01-23 VITALS — BP 126/88 | HR 64 | Ht 62.0 in | Wt 218.0 lb

## 2022-01-23 DIAGNOSIS — R002 Palpitations: Secondary | ICD-10-CM

## 2022-01-23 DIAGNOSIS — E782 Mixed hyperlipidemia: Secondary | ICD-10-CM | POA: Diagnosis not present

## 2022-01-23 DIAGNOSIS — Z79811 Long term (current) use of aromatase inhibitors: Secondary | ICD-10-CM | POA: Diagnosis not present

## 2022-01-23 DIAGNOSIS — Z79899 Other long term (current) drug therapy: Secondary | ICD-10-CM | POA: Diagnosis not present

## 2022-01-23 DIAGNOSIS — D520 Dietary folate deficiency anemia: Secondary | ICD-10-CM

## 2022-01-23 DIAGNOSIS — Z7901 Long term (current) use of anticoagulants: Secondary | ICD-10-CM | POA: Diagnosis not present

## 2022-01-23 DIAGNOSIS — Z888 Allergy status to other drugs, medicaments and biological substances status: Secondary | ICD-10-CM | POA: Insufficient documentation

## 2022-01-23 DIAGNOSIS — R0789 Other chest pain: Secondary | ICD-10-CM

## 2022-01-23 DIAGNOSIS — Z86718 Personal history of other venous thrombosis and embolism: Secondary | ICD-10-CM | POA: Diagnosis not present

## 2022-01-23 DIAGNOSIS — I2510 Atherosclerotic heart disease of native coronary artery without angina pectoris: Secondary | ICD-10-CM | POA: Diagnosis not present

## 2022-01-23 DIAGNOSIS — D509 Iron deficiency anemia, unspecified: Secondary | ICD-10-CM | POA: Diagnosis not present

## 2022-01-23 DIAGNOSIS — Z881 Allergy status to other antibiotic agents status: Secondary | ICD-10-CM | POA: Insufficient documentation

## 2022-01-23 DIAGNOSIS — I10 Essential (primary) hypertension: Secondary | ICD-10-CM | POA: Diagnosis not present

## 2022-01-23 DIAGNOSIS — Z170 Estrogen receptor positive status [ER+]: Secondary | ICD-10-CM

## 2022-01-23 DIAGNOSIS — I2584 Coronary atherosclerosis due to calcified coronary lesion: Secondary | ICD-10-CM | POA: Diagnosis not present

## 2022-01-23 DIAGNOSIS — Z880 Allergy status to penicillin: Secondary | ICD-10-CM | POA: Diagnosis not present

## 2022-01-23 DIAGNOSIS — I471 Supraventricular tachycardia: Secondary | ICD-10-CM

## 2022-01-23 DIAGNOSIS — D6851 Activated protein C resistance: Secondary | ICD-10-CM

## 2022-01-23 DIAGNOSIS — C50411 Malignant neoplasm of upper-outer quadrant of right female breast: Secondary | ICD-10-CM | POA: Diagnosis not present

## 2022-01-23 LAB — CBC WITH DIFFERENTIAL (CANCER CENTER ONLY)
Abs Immature Granulocytes: 0.03 10*3/uL (ref 0.00–0.07)
Basophils Absolute: 0.1 10*3/uL (ref 0.0–0.1)
Basophils Relative: 1 %
Eosinophils Absolute: 0.1 10*3/uL (ref 0.0–0.5)
Eosinophils Relative: 2 %
HCT: 34.3 % — ABNORMAL LOW (ref 36.0–46.0)
Hemoglobin: 10.6 g/dL — ABNORMAL LOW (ref 12.0–15.0)
Immature Granulocytes: 0 %
Lymphocytes Relative: 32 %
Lymphs Abs: 2.6 10*3/uL (ref 0.7–4.0)
MCH: 25.3 pg — ABNORMAL LOW (ref 26.0–34.0)
MCHC: 30.9 g/dL (ref 30.0–36.0)
MCV: 81.9 fL (ref 80.0–100.0)
Monocytes Absolute: 0.4 10*3/uL (ref 0.1–1.0)
Monocytes Relative: 5 %
Neutro Abs: 4.8 10*3/uL (ref 1.7–7.7)
Neutrophils Relative %: 60 %
Platelet Count: 298 10*3/uL (ref 150–400)
RBC: 4.19 MIL/uL (ref 3.87–5.11)
RDW: 17 % — ABNORMAL HIGH (ref 11.5–15.5)
WBC Count: 8 10*3/uL (ref 4.0–10.5)
nRBC: 0 % (ref 0.0–0.2)

## 2022-01-23 LAB — CMP (CANCER CENTER ONLY)
ALT: 24 U/L (ref 0–44)
AST: 27 U/L (ref 15–41)
Albumin: 3.9 g/dL (ref 3.5–5.0)
Alkaline Phosphatase: 84 U/L (ref 38–126)
Anion gap: 6 (ref 5–15)
BUN: 15 mg/dL (ref 8–23)
CO2: 29 mmol/L (ref 22–32)
Calcium: 9.7 mg/dL (ref 8.9–10.3)
Chloride: 102 mmol/L (ref 98–111)
Creatinine: 1.09 mg/dL — ABNORMAL HIGH (ref 0.44–1.00)
GFR, Estimated: 57 mL/min — ABNORMAL LOW (ref >60)
Glucose, Bld: 140 mg/dL — ABNORMAL HIGH (ref 70–99)
Potassium: 4.7 mmol/L (ref 3.5–5.1)
Sodium: 137 mmol/L (ref 135–145)
Total Bilirubin: 0.4 mg/dL (ref 0.3–1.2)
Total Protein: 7.6 g/dL (ref 6.5–8.1)

## 2022-01-23 LAB — IRON AND IRON BINDING CAPACITY (CC-WL,HP ONLY)
Iron: 39 ug/dL (ref 28–170)
Saturation Ratios: 8 % — ABNORMAL LOW (ref 10.4–31.8)
TIBC: 466 ug/dL — ABNORMAL HIGH (ref 250–450)
UIBC: 427 ug/dL (ref 148–442)

## 2022-01-23 LAB — PROTIME-INR
INR: 1.7 — ABNORMAL HIGH (ref 0.8–1.2)
Prothrombin Time: 19.4 seconds — ABNORMAL HIGH (ref 11.4–15.2)

## 2022-01-23 LAB — VITAMIN B12: Vitamin B-12: 173 pg/mL — ABNORMAL LOW (ref 180–914)

## 2022-01-23 LAB — FERRITIN: Ferritin: 7 ng/mL — ABNORMAL LOW (ref 11–307)

## 2022-01-23 MED ORDER — PROMETHAZINE HCL 25 MG PO TABS: 25.0000 mg | ORAL_TABLET | Freq: Four times a day (QID) | ORAL | 3 refills | Status: AC | PRN

## 2022-01-23 MED ORDER — ANASTROZOLE 1 MG PO TABS: 1.0000 mg | ORAL_TABLET | Freq: Every day | ORAL | 3 refills | Status: DC

## 2022-01-23 NOTE — Patient Instructions (Addendum)
Medication Instructions:  ?No Medication Changes. Continue your current medications as directed. ? ?*If you need a refill on your cardiac medications before your next appointment, please call your pharmacy* ? ? ?Lab Work: ?NONE ordered at this time of appointment  ? ?If you have labs (blood work) drawn today and your tests are completely normal, you will receive your results only by: ?MyChart Message (if you have MyChart) OR ?A paper copy in the mail ?If you have any lab test that is abnormal or we need to change your treatment, we will call you to review the results. ? ? ?Testing/Procedures: ?NONE ordered at this time of appointment  ? ? ?Follow-Up: ?At [facility], you and your health needs are our priority.  As part of our continuing mission to provide you with exceptional heart care, we have created designated Provider Care Teams.  These Care Teams include your primary Cardiologist (physician) and Advanced Practice Providers (APPs -  Physician Assistants and Nurse Practitioners) who all work together to provide you with the care you need, when you need it. ? ?We recommend signing up for the patient portal called "MyChart".  Sign up information is provided on this After Visit Summary.  MyChart is used to connect with patients for Virtual Visits (Telemedicine).  Patients are able to view lab/test results, encounter notes, upcoming appointments, etc.  Non-urgent messages can be sent to your provider as well.   ?To learn more about what you can do with MyChart, go to [email or web address].   ? ?Your next appointment:   ?6 month(s) ? ?The format for your next appointment:   ?In Person ? ?Provider:   ?Quay Burow, MD   ? ? ?Other Instructions ? ?Important Information About Sugar ? ? ? ? ? ? ?

## 2022-01-23 NOTE — Progress Notes (Signed)
? ?Patient Care Team: ?Sharilyn Sites, MD as PCP - General (Family Medicine) ?Lorretta Harp, MD as PCP - Cardiology (Cardiology) ? ?DIAGNOSIS:  ?Encounter Diagnoses  ?Name Primary?  ? Malignant neoplasm of upper-outer quadrant of right breast in female, estrogen receptor positive ([place])   ? Dietary folate deficiency anemia Yes  ? ? ?SUMMARY OF ONCOLOGIC HISTORY: ?Oncology History  ?Malignant neoplasm of upper-outer quadrant of right breast in female, estrogen receptor positive ([place])  ?2008 Initial Diagnosis  ? T2N0, ER PR positive, HER-2 negative, invasive ductal carcinoma of the right breast.    Status post bilateral mastectomies, radiation and antiestrogen therapy since 2009 treatment was done at [facility]. ? ?  ?2009 -  Anti-estrogen oral therapy  ? Anastrozole 1 mg daily.  Plan is to treat her indefinitely.  Patient understands risks and benefits.  There is no data for long-term antiestrogen therapy but she is very anxious to come off of it. ? ?  ? ?CHIEF COMPLIANT: Follow up breast cancer on anastrozole ? ?INTERVAL HISTORY: Jessica Reilly is a 61 y.o.. with the following above breast cancer on anastrozole. She presents to the clinic today for a annual follow-up.  She is tolerating anastrozole extremely well without any problems or concerns.  She has not been doing much physical exercise or activity.  Denies any lumps or nodules in the breast. ? ? ?ALLERGIES:  is allergic to amoxicillin-pot clavulanate, ceftin, cefuroxime, cefuroxime axetil, and risperidone and related. ? ?MEDICATIONS:  ?Current Outpatient Medications  ?Medication Sig Dispense Refill  ? promethazine (PHENERGAN) 25 MG tablet Take 1 tablet (25 mg total) by mouth every 6 (six) hours as needed for nausea. 30 tablet 3  ? acetaminophen (TYLENOL) 500 MG tablet Take 1,000 mg by mouth every 6 (six) hours as needed for moderate pain.    ? alprazolam (XANAX) 2 MG tablet Take 1-2 mg by mouth 2 (two) times daily as needed for anxiety.    ?  anastrozole (ARIMIDEX) 1 MG tablet Take 1 tablet (1 mg total) by mouth at bedtime. 90 tablet 3  ? atorvastatin (LIPITOR) 40 MG tablet Take 40 mg by mouth daily.    ? Calcium Carbonate-Vit D-Min (CALCIUM 600+D3 PLUS MINERALS) 600-800 MG-UNIT TABS Take 2 tablets by mouth daily.    ? docusate sodium (STOOL SOFTENER) 100 MG capsule Take 2 capsules (200 mg total) by mouth at bedtime. 10 capsule 0  ? esomeprazole (NEXIUM) 40 MG capsule Take 20 mg by mouth 2 (two) times daily before a meal.    ? fluticasone (FLONASE) 50 MCG/ACT nasal spray Place 2 sprays into both nostrils at bedtime.    ? GAVILYTE-G 236 g solution Take by mouth as directed.    ? glucose blood (ONETOUCH VERIO) test strip 1 each by Other route daily in the afternoon. Use as instructed 100 each 3  ? levocetirizine (XYZAL) 5 MG tablet TAKE ONE TABLET BY MOUTH AT BEDTIME. 30 tablet 3  ? Magnesium 400 MG TABS Take 400 mg by mouth daily.    ? metFORMIN (GLUCOPHAGE-XR) 500 MG 24 hr tablet Take 1 tablet for 1 week and then increase to 2 tablets 180 tablet 1  ? metoprolol (TOPROL-XL) 100 MG 24 hr tablet Take 100 mg by mouth daily.      ? omega-3 acid ethyl esters (LOVAZA) 1 g capsule Take 1 capsule by mouth 2 (two) times daily.    ? omega-3 fish oil (MAXEPA) 1000 MG CAPS capsule Take 1 capsule (1,000 mg total) by mouth  2 (two) times daily. 180 each 3  ? OneTouch Delica Lancets 16X MISC Check 1-2 times daily 100 each 2  ? warfarin (COUMADIN) 7.5 MG tablet TAKE 1 TABLET BY MOUTH DAILY EXCEPT 1/2 TABLET ON TUESDAY & THURSDAY. 90 tablet 0  ? ?No current facility-administered medications for this visit.  ? ? ?PHYSICAL EXAMINATION: ?ECOG PERFORMANCE STATUS: 1 - Symptomatic but completely ambulatory ? ?Vitals:  ? 01/23/22 1013  ?BP: (!) 155/80  ?Pulse: 77  ?Resp: 19  ?Temp: 97.7 ?F (36.5 ?C)  ?SpO2: 100%  ? ?Filed Weights  ? 01/23/22 1013  ?Weight: 217 lb 14.4 oz (98.8 kg)  ? ? ?BREAST: No palpable masses or nodules in either right or left breasts. No palpable axillary  supraclavicular or infraclavicular adenopathy no breast tenderness or nipple discharge. (exam performed in the presence of a chaperone) ? ?LABORATORY DATA:  ?I have reviewed the data as listed ? ?  Latest Ref Rng & Units 01/23/2022  ?  9:55 AM 09/05/2021  ? 10:46 AM 06/21/2021  ? 12:00 AM  ?CMP  ?Glucose 70 - 99 mg/dL 140      ?BUN 8 - 23 mg/dL 15    13       ?Creatinine 0.44 - 1.00 mg/dL 1.09    1.0       ?Sodium 135 - 145 mmol/L 137      ?Potassium 3.5 - 5.1 mmol/L 4.7      ?Chloride 98 - 111 mmol/L 102      ?CO2 22 - 32 mmol/L 29      ?Calcium 8.9 - 10.3 mg/dL 9.7    9.9       ?Total Protein 6.5 - 8.1 g/dL 7.6   7.3     ?Total Bilirubin 0.3 - 1.2 mg/dL 0.4   0.3     ?Alkaline Phos 38 - 126 U/L 84   103     ?AST 15 - 41 U/L 27   25     ?ALT 0 - 44 U/L 24   23     ?  ? This result is from an external source.  ? ? ?Lab Results  ?Component Value Date  ? WBC 8.0 01/23/2022  ? HGB 10.6 (L) 01/23/2022  ? HCT 34.3 (L) 01/23/2022  ? MCV 81.9 01/23/2022  ? PLT 298 01/23/2022  ? NEUTROABS 4.8 01/23/2022  ? ? 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  Status post bilateral mastectomies at [facility], radiation, and chemotherapy CEF x5 cycles followed by radiation to the right chest ?  ?Current treatment: Anastrozole started 2009 ?Anastrozole toxicities: Denies any hot flashes or myalgias. ?  ?Breast cancer surveillance: ?1.  01/23/2022: Breast examination: Bilateral mastectomies, no palpable lumps or nodules of concern ?2.  No role of imaging studies since she had bilateral mastectomies ?  ?Patient wishes to stay on anastrozole indefinitely. ?Bone density done at [facility] February 2022: T score -1.3: Osteopenia ?I recommended bone density every 2 years ?  ?Recurrent DVTs  lab testing was done and she was found to be a heterozygote for the  factor V Leiden gene mutation. She was anticoagulated and she has been kept on therapeutic Coumadin.  ?She does her INR checks at home. Pharmacy has been managing her INRs. ?  ? ?Anemia: Appears to be iron deficiency related. ?Return to clinic in 1  year for follow-up ?  ? ? ?Orders Placed This Encounter  ?Procedures  ? Ferritin  ?  Standing Status:   Future  ?  Number of Occurrences:   1  ?  Standing Expiration Date:   01/23/2023  ? Iron and Iron Binding Capacity ([facility] only)  ?  Standing Status:   Future  ?  Number of Occurrences:   1  ?  Standing Expiration Date:   01/24/2023  ? Vitamin B12  ?  Standing Status:   Future  ?  Number of Occurrences:   1  ?  Standing Expiration Date:   01/24/2023  ? ?The patient has a good understanding of the overall plan. she agrees with it. she will call with any problems that may develop before the next visit here. ?Total time spent: 30 mins including face to face time and time spent for planning, charting and co-ordination of care ? ? Harriette Ohara, MD ?01/23/22 ? ? ?  ?

## 2022-01-23 NOTE — Progress Notes (Signed)
? ? ?Office Visit  ?  ?Patient Name: Jessica Reilly ?Date of Encounter: 01/23/2022 ? ?Primary Care Provider:  Sharilyn Sites, MD ?Primary Cardiologist:  Quay Burow, MD ? ?Chief Complaint  ?  ?61 year old female with a history of atypical chest pain, palpitations, hypertension, hyperlipidemia, type 2 diabetes, DVT, factor V Leiden mutation, GERD, breast cancer s/p bilateral mastectomy, and bipolar disorder who presents for follow-up related to hypertension, hyperlipidemia, and palpitations. ? ?Past Medical History  ?  ?Past Medical History:  ?Diagnosis Date  ? Abnormal Pap smear   ? Acid reflux   ? Bipolar disorder ([place]) 03/22/2013  ? Breast cancer ([place])   ? Breast disorder   ? cancer  ? Chronic anticoagulation 03/22/2013  ? Coagulopathy ([place]) 04/10/2012  ? Constipation   ? DVT of upper extremity (deep vein thrombosis) ([place]) 04/10/2012  ? Factor V Leiden ([place])   ? Factor V Leiden mutation ([place]) 04/10/2012  ? Family history of ovarian cancer   ? Fatty liver 06/04/2017  ? Fibroid   ? Gallstones   ? Heart murmur   ? High cholesterol   ? History of abnormal cervical Pap smear 01/06/2015  ? History of breast cancer 01/06/2015  ? HTN (hypertension)   ? HX: breast cancer   ? Hypertension 12/31/2012  ? Lobular carcinoma of breast, estrogen receptor positive, 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  ? Mental disorder   ? panic attacks  ? Osteoarthritis   ? Pain in both knees   ? Vaginal Pap smear, abnormal   ? ?Past Surgical History:  ?Procedure Laterality Date  ? BIOPSY  11/23/2021  ? Procedure: BIOPSY;  Surgeon: Rogene Houston, MD;  [facility];  Service: Endoscopy;;  ? BREAST LUMPECTOMY    ? COLONOSCOPY WITH ESOPHAGOGASTRODUODENOSCOPY (EGD) N/A 07/23/2013  ? Procedure: COLONOSCOPY WITH ESOPHAGOGASTRODUODENOSCOPY (EGD);  Surgeon: Rogene Houston, MD;  [facility]: [facility];  Service: Endoscopy;  Laterality: N/A;  125  ? COLONOSCOPY WITH PROPOFOL N/A 11/23/2021  ? Procedure: COLONOSCOPY WITH PROPOFOL;  Surgeon: Rogene Houston, MD;  [facility]: [facility];  Service: Endoscopy;  Laterality: N/A;  855  ? ESOPHAGOGASTRODUODENOSCOPY (EGD) WITH PROPOFOL N/A 11/23/2021  ? Procedure: ESOPHAGOGASTRODUODENOSCOPY (EGD) WITH PROPOFOL;  Surgeon: Rogene Houston, MD;  [facility];  Service: Endoscopy;  Laterality: N/A;  ? LYMPH NODE BIOPSY    ? MASTECTOMY  bilateral  ? ovaries removed    ? POLYPECTOMY  11/23/2021  ? Procedure: POLYPECTOMY;  Surgeon: Rogene Houston, MD;  [facility]: [facility];  Service: Endoscopy;;  ? TONSILLECTOMY AND ADENOIDECTOMY    ? ? ?Allergies ? ?Allergies  ?Allergen Reactions  ? Amoxicillin-Pot Clavulanate Rash  ? Ceftin Rash  ? Cefuroxime Rash  ? Cefuroxime Axetil Rash  ? Risperidone And Related Other (See Comments)  ?  Has medical contraindications  ? ? ?History of Present Illness  ?  ?61 year old female with the above past medical history including atypical chest pain, palpitations, hypertension, hyperlipidemia, type 2 diabetes, DVT, factor V Leiden mutation, GERD, breast cancer s/p bilateral mastectomy, and bipolar disorder. ? ?She has a history of atypical chest pain. Myoview stress test October 2020 was negative. Echocardiogram in May 2021 showed EF 60 to 65%, G1 DD, mild dilation of ascending aorta, 37 mm.  Coronary calcium score in May 2021 was 40, 85th percentile for age and sex matched control.  Outpatient cardiac monitor in the setting of palpitations in June 2021 showed sinus bradycardia, sinus rhythm, sinus tachycardia, occasional PACs  and PVCs, short runs of SVT.  She has a history of factor V Leiden deficiency with a history of bilateral upper extremity DVTs, on Coumadin.  She was last seen in the office on 05/18/2021 and was stable overall from a cardiac standpoint, though she did continue to report daily palpitations, managed on metoprolol, as well as chest pain that occurred several times a week, lasting for hours at a time.  Atorvastatin was increased to 40 mg daily with improvement in  LDL. ? ?She presents today for follow-up.  Since her last visit she has been stable from a cardiac standpoint.  She does continue to have palpitations, intermittent chest discomfort. Her palpitations last seconds to minutes, with no associated symptoms.  She describes her chest discomfort as an intermittent heaviness that occurs at rest, no associated symptoms.  She denies exertional symptoms concerning for angina.  She was recently diagnosed on metformin for type 2 diabetes.  She does continue to struggle with anxiety and often worries that something "bad will happen" to her heart.  Overall though, she reports feeling well denies any additional concerns today. ? ?Home Medications  ?  ?Current Outpatient Medications  ?Medication Sig Dispense Refill  ? acetaminophen (TYLENOL) 500 MG tablet Take 1,000 mg by mouth every 6 (six) hours as needed for moderate pain.    ? alprazolam (XANAX) 2 MG tablet Take 1-2 mg by mouth 2 (two) times daily as needed for anxiety.    ? anastrozole (ARIMIDEX) 1 MG tablet TAKE 1 TABLET BY MOUTH DAILY. (Patient taking differently: Take 1 mg by mouth at bedtime.) 90 tablet 3  ? atorvastatin (LIPITOR) 40 MG tablet Take 40 mg by mouth daily.    ? Calcium Carbonate-Vit D-Min (CALCIUM 600+D3 PLUS MINERALS) 600-800 MG-UNIT TABS Take 2 tablets by mouth daily.    ? docusate sodium (STOOL SOFTENER) 100 MG capsule Take 2 capsules (200 mg total) by mouth at bedtime. 10 capsule 0  ? esomeprazole (NEXIUM) 40 MG capsule Take 20 mg by mouth 2 (two) times daily before a meal.    ? fluticasone (FLONASE) 50 MCG/ACT nasal spray Place 2 sprays into both nostrils at bedtime.    ? GAVILYTE-G 236 g solution Take by mouth as directed.    ? glucose blood (ONETOUCH VERIO) test strip 1 each by Other route daily in the afternoon. Use as instructed 100 each 3  ? levocetirizine (XYZAL) 5 MG tablet TAKE ONE TABLET BY MOUTH AT BEDTIME. 30 tablet 3  ? Magnesium 400 MG TABS Take 400 mg by mouth daily.    ? metFORMIN  (GLUCOPHAGE-XR) 500 MG 24 hr tablet Take 1 tablet for 1 week and then increase to 2 tablets 180 tablet 1  ? metoprolol (TOPROL-XL) 100 MG 24 hr tablet Take 100 mg by mouth daily.      ? omega-3 acid ethyl esters (LOVAZA) 1 g capsule Take 1 capsule by mouth 2 (two) times daily.    ? omega-3 fish oil (MAXEPA) 1000 MG CAPS capsule Take 1 capsule (1,000 mg total) by mouth 2 (two) times daily. 180 each 3  ? OneTouch Delica Lancets 76P MISC Check 1-2 times daily 100 each 2  ? promethazine (PHENERGAN) 25 MG tablet Take 25 mg by mouth every 6 (six) hours as needed for nausea or vomiting.    ? warfarin (COUMADIN) 7.5 MG tablet TAKE 1 TABLET BY MOUTH DAILY EXCEPT 1/2 TABLET ON TUESDAY & THURSDAY. 90 tablet 0  ? ?No current facility-administered medications for this visit.  ?  ? ?  Review of Systems  ?  ?She denies chest pain, dyspnea, pnd, orthopnea, n, v, dizziness, syncope, edema, weight gain, or early satiety. All other systems reviewed and are otherwise negative except as noted above.  ? ?Physical Exam  ?  ?VS:  BP 126/88   Pulse 64   Ht '5\' 2"'$  (1.575 m)   Wt 218 lb (98.9 kg)   SpO2 94%   BMI 39.87 kg/m?  ?GEN: Well nourished, well developed, in no acute distress. ?HEENT: normal. ?Neck: Supple, no JVD, carotid bruits, or masses. ?Cardiac: RRR, no murmurs, rubs, or gallops. No clubbing, cyanosis, edema.  Radials/DP/PT 2+ and equal bilaterally.  ?Respiratory:  Respirations regular and unlabored, clear to auscultation bilaterally. ?GI: Soft, nontender, nondistended, BS + x 4. ?MS: no deformity or atrophy. ?Skin: warm and dry, no rash. ?Neuro:  Strength and sensation are intact. ?Psych: Normal affect. ? ?Accessory Clinical Findings  ?  ?ECG personally reviewed by me today -NSR, 64 bpm- no acute changes. ? ?Lab Results  ?Component Value Date  ? WBC 9.4 01/20/2021  ? HGB 11.8 (L) 01/20/2021  ? HCT 37.5 01/20/2021  ? MCV 82.4 01/20/2021  ? PLT 323 01/20/2021  ? ?Lab Results  ?Component Value Date  ? CREATININE 1.0 06/21/2021   ? BUN 13 06/21/2021  ? NA 139 01/20/2021  ? K 4.7 01/20/2021  ? CL 102 01/20/2021  ? CO2 27 01/20/2021  ? ?Lab Results  ?Component Value Date  ? ALT 23 09/05/2021  ? AST 25 09/05/2021  ? ALKPHOS 103 09/05/2021  ? BILITOT

## 2022-01-23 NOTE — Assessment & Plan Note (Signed)
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 ?Status post?bilateral?mastectomies?at [facility], radiation, and chemotherapy?CEF x5 cycles?followed by radiation to the right chest ?? ?Current treatment:?Anastrozole started 2009 ?Anastrozole toxicities:?Denies any hot flashes or myalgias. ?? ?Breast cancer surveillance: ?1.??01/23/2022:?Breast examination: Bilateral mastectomies, no palpable lumps or nodules of concern ?2.??No role of imaging studies since she had bilateral mastectomies ?? ?Patient wishes to stay on anastrozole indefinitely. ?Bone density done at [facility] February 2022: T score -1.3: Osteopenia ?I recommended bone density every 2 years ?? ?Recurrent DVTs??lab testing was done and she was?found to be a heterozygote?for the factor V Leiden gene mutation.?She was anticoagulated and she has been kept on therapeutic Coumadin.? ?She does her INR checks at home.?Pharmacy has been managing her INRs. ?? ?Return to clinic in 1 year for follow-up ?? ?

## 2022-01-25 ENCOUNTER — Telehealth: Payer: Self-pay | Admitting: Gastroenterology

## 2022-01-25 NOTE — Telephone Encounter (Signed)
Hi Dr. Lyndel Safe, ? ?Patient is preferring you as GI provider. ? ?Patient is seeking a new GI provider, hx at [facility] for GI diseases. Per patient, Dr. Laural Golden will be retiring. Last colonoscopy 11/23/21. We have records available for review on Epic. Can you please advise on scheduling? ? ?Thank you ?

## 2022-01-29 NOTE — Telephone Encounter (Signed)
No problems ?She recently had extensive work-up by Dr. Laural Golden including EGD/colonoscopy-reports in epic ?Just needs a routine appt in [facility] clinic or with me.  ?OK in 6 months.  Earlier if she is having any problems. ? ?RG ?

## 2022-01-30 NOTE — Telephone Encounter (Signed)
Pt states that she has questions about her last Colonoscopy and questions medications: ?Pt was scheduled an appointment to see Dr. Lyndel Safe on 03/15/2022 at 8:50: Pt made aware:Pt given instructions: ?Pt verbalized understanding with all questions answered.  ? ?

## 2022-02-02 ENCOUNTER — Other Ambulatory Visit: Payer: Self-pay | Admitting: Hematology and Oncology

## 2022-02-02 ENCOUNTER — Telehealth: Payer: Self-pay | Admitting: *Deleted

## 2022-02-02 ENCOUNTER — Encounter: Payer: Self-pay | Admitting: *Deleted

## 2022-02-02 ENCOUNTER — Encounter: Payer: Self-pay | Admitting: Hematology and Oncology

## 2022-02-02 DIAGNOSIS — D509 Iron deficiency anemia, unspecified: Secondary | ICD-10-CM | POA: Insufficient documentation

## 2022-02-02 LAB — PROTIME-INR: INR: 1.7 — AB (ref 0.80–1.20)

## 2022-02-02 NOTE — Telephone Encounter (Signed)
Received call from pt stating home INR testing showed INR of 1.7 today at 0814.  Pt states she is currently taking Warfarin 7.5 mg p.o daily, 7 days a week. Pt also requesting to review recent Iron studies with MD.  RN will review with MD for further recommendations.  ?

## 2022-02-02 NOTE — Progress Notes (Signed)
Per MD pt to continue current dose of Warfarin- 7.5 mg p.o daily and re-check INR in 1-2 weeks at home. MD also states pt needing to be schedule for IV Venofer 300 mg and states he has sent a scheduling message to arrange appts as well as spoke with pt regarding tx plan.  No nursing orders received at this time.  ?

## 2022-02-03 ENCOUNTER — Telehealth: Payer: Self-pay | Admitting: Hematology and Oncology

## 2022-02-03 NOTE — Telephone Encounter (Signed)
.  Called patient to schedule appointment per 4/27 inbasket, patient is aware of date and time.   ?

## 2022-02-06 ENCOUNTER — Other Ambulatory Visit: Payer: Self-pay | Admitting: Cardiovascular Disease

## 2022-02-08 DIAGNOSIS — F419 Anxiety disorder, unspecified: Secondary | ICD-10-CM | POA: Diagnosis not present

## 2022-02-08 DIAGNOSIS — E119 Type 2 diabetes mellitus without complications: Secondary | ICD-10-CM | POA: Diagnosis not present

## 2022-02-10 ENCOUNTER — Telehealth: Payer: Self-pay

## 2022-02-10 NOTE — Telephone Encounter (Signed)
Patient states that her oncologist recommended that she get IFV iron infusion Venofer. Patient wants to know if you think that her numbers are low enough to do the infusion and will it interfere with her diabetes medication.   ? ? ?

## 2022-02-10 NOTE — Telephone Encounter (Signed)
Patient advise and verbalized understanding. 

## 2022-02-20 ENCOUNTER — Inpatient Hospital Stay: Payer: PPO

## 2022-02-27 ENCOUNTER — Other Ambulatory Visit: Payer: Self-pay

## 2022-02-27 ENCOUNTER — Inpatient Hospital Stay: Payer: PPO | Attending: Hematology and Oncology

## 2022-02-27 VITALS — BP 145/86 | HR 63 | Temp 98.0°F | Resp 18 | Wt 213.5 lb

## 2022-02-27 DIAGNOSIS — L308 Other specified dermatitis: Secondary | ICD-10-CM | POA: Diagnosis not present

## 2022-02-27 DIAGNOSIS — D509 Iron deficiency anemia, unspecified: Secondary | ICD-10-CM | POA: Diagnosis not present

## 2022-02-27 DIAGNOSIS — L400 Psoriasis vulgaris: Secondary | ICD-10-CM | POA: Diagnosis not present

## 2022-02-27 DIAGNOSIS — L821 Other seborrheic keratosis: Secondary | ICD-10-CM | POA: Diagnosis not present

## 2022-02-27 MED ORDER — SODIUM CHLORIDE 0.9 % IV SOLN
300.0000 mg | Freq: Once | INTRAVENOUS | Status: AC
  Administered 2022-02-27: 300 mg via INTRAVENOUS
  Filled 2022-02-27: qty 300

## 2022-02-27 MED ORDER — SODIUM CHLORIDE 0.9 % IV SOLN: Freq: Once | INTRAVENOUS | Status: AC

## 2022-02-27 NOTE — Patient Instructions (Signed)

## 2022-02-27 NOTE — Progress Notes (Signed)
Pt observed for 30 minutes post Venofer infusion. Pt tolerated trtmt well w/out incident. VSS at discharge.  Ambulatory to lobby.   

## 2022-03-01 ENCOUNTER — Encounter: Payer: Self-pay | Admitting: Hematology and Oncology

## 2022-03-01 ENCOUNTER — Telehealth: Payer: Self-pay | Admitting: *Deleted

## 2022-03-01 ENCOUNTER — Other Ambulatory Visit: Payer: Self-pay | Admitting: Hematology and Oncology

## 2022-03-01 DIAGNOSIS — I82622 Acute embolism and thrombosis of deep veins of left upper extremity: Secondary | ICD-10-CM

## 2022-03-01 DIAGNOSIS — I82623 Acute embolism and thrombosis of deep veins of upper extremity, bilateral: Secondary | ICD-10-CM

## 2022-03-01 DIAGNOSIS — I82629 Acute embolism and thrombosis of deep veins of unspecified upper extremity: Secondary | ICD-10-CM

## 2022-03-01 LAB — POCT INR: INR: 2.1 (ref 2.0–3.0)

## 2022-03-01 NOTE — Telephone Encounter (Signed)
Received call from pt stating home INR resulted today 03/01/22 as 2.1.  per MD pt needing to continue warfarin 7.5 mg tablet daily and recheck in 2 weeks.  Pt verbalized understanding.

## 2022-03-02 ENCOUNTER — Telehealth: Payer: Self-pay | Admitting: Hematology and Oncology

## 2022-03-02 NOTE — Telephone Encounter (Signed)
.  Called patient to schedule appointment per 5/24 inbasket, patient did not answer

## 2022-03-08 ENCOUNTER — Inpatient Hospital Stay: Payer: PPO

## 2022-03-08 ENCOUNTER — Other Ambulatory Visit: Payer: Self-pay

## 2022-03-08 VITALS — BP 120/74 | HR 64 | Temp 98.2°F | Resp 18 | Wt 211.5 lb

## 2022-03-08 DIAGNOSIS — D509 Iron deficiency anemia, unspecified: Secondary | ICD-10-CM

## 2022-03-08 MED ORDER — SODIUM CHLORIDE 0.9 % IV SOLN
300.0000 mg | Freq: Once | INTRAVENOUS | Status: AC
  Administered 2022-03-08: 300 mg via INTRAVENOUS
  Filled 2022-03-08: qty 300

## 2022-03-08 MED ORDER — SODIUM CHLORIDE 0.9 % IV SOLN: Freq: Once | INTRAVENOUS | Status: AC

## 2022-03-08 NOTE — Progress Notes (Signed)
Patient declined to stay for 30 minute post-iron observation. Patient agreed to stay for 15 minute observation. Vitals stable, ambulatory to lobby.

## 2022-03-13 ENCOUNTER — Other Ambulatory Visit: Payer: Self-pay

## 2022-03-13 ENCOUNTER — Inpatient Hospital Stay: Payer: PPO | Attending: Hematology and Oncology

## 2022-03-13 VITALS — BP 121/68 | HR 60 | Temp 98.2°F | Resp 18 | Wt 211.0 lb

## 2022-03-13 DIAGNOSIS — D509 Iron deficiency anemia, unspecified: Secondary | ICD-10-CM | POA: Diagnosis not present

## 2022-03-13 MED ORDER — SODIUM CHLORIDE 0.9 % IV SOLN: Freq: Once | INTRAVENOUS | Status: AC

## 2022-03-13 MED ORDER — SODIUM CHLORIDE 0.9 % IV SOLN
300.0000 mg | Freq: Once | INTRAVENOUS | Status: AC
  Administered 2022-03-13: 300 mg via INTRAVENOUS
  Filled 2022-03-13: qty 300

## 2022-03-13 NOTE — Patient Instructions (Signed)

## 2022-03-13 NOTE — Progress Notes (Signed)
Pt declined to stay for 30 minutes post Venofer infusion obs. Pt tolerated trtmt well w/out incident. VSS at discharge.  Ambulatory to lobby.

## 2022-03-15 ENCOUNTER — Encounter: Payer: Self-pay | Admitting: Gastroenterology

## 2022-03-15 ENCOUNTER — Other Ambulatory Visit (INDEPENDENT_AMBULATORY_CARE_PROVIDER_SITE_OTHER): Payer: PPO

## 2022-03-15 ENCOUNTER — Ambulatory Visit (INDEPENDENT_AMBULATORY_CARE_PROVIDER_SITE_OTHER): Payer: PPO | Admitting: Gastroenterology

## 2022-03-15 VITALS — BP 140/88 | HR 65 | Ht 62.0 in | Wt 208.5 lb

## 2022-03-15 DIAGNOSIS — K317 Polyp of stomach and duodenum: Secondary | ICD-10-CM

## 2022-03-15 DIAGNOSIS — D509 Iron deficiency anemia, unspecified: Secondary | ICD-10-CM

## 2022-03-15 DIAGNOSIS — Z87442 Personal history of urinary calculi: Secondary | ICD-10-CM

## 2022-03-15 DIAGNOSIS — R1012 Left upper quadrant pain: Secondary | ICD-10-CM

## 2022-03-15 LAB — CBC WITH DIFFERENTIAL/PLATELET
Basophils Absolute: 0.1 10*3/uL (ref 0.0–0.1)
Basophils Relative: 0.8 % (ref 0.0–3.0)
Eosinophils Absolute: 0.1 10*3/uL (ref 0.0–0.7)
Eosinophils Relative: 1.4 % (ref 0.0–5.0)
HCT: 39.4 % (ref 36.0–46.0)
Hemoglobin: 12.9 g/dL (ref 12.0–15.0)
Lymphocytes Relative: 35 % (ref 12.0–46.0)
Lymphs Abs: 2.8 10*3/uL (ref 0.7–4.0)
MCHC: 32.7 g/dL (ref 30.0–36.0)
MCV: 81.2 fl (ref 78.0–100.0)
Monocytes Absolute: 0.4 10*3/uL (ref 0.1–1.0)
Monocytes Relative: 4.8 % (ref 3.0–12.0)
Neutro Abs: 4.7 10*3/uL (ref 1.4–7.7)
Neutrophils Relative %: 58 % (ref 43.0–77.0)
Platelets: 276 10*3/uL (ref 150.0–400.0)
RBC: 4.86 Mil/uL (ref 3.87–5.11)
RDW: 21.2 % — ABNORMAL HIGH (ref 11.5–15.5)
WBC: 8.1 10*3/uL (ref 4.0–10.5)

## 2022-03-15 LAB — COMPREHENSIVE METABOLIC PANEL
ALT: 30 U/L (ref 0–35)
AST: 27 U/L (ref 0–37)
Albumin: 4.4 g/dL (ref 3.5–5.2)
Alkaline Phosphatase: 76 U/L (ref 39–117)
BUN: 14 mg/dL (ref 6–23)
CO2: 26 mEq/L (ref 19–32)
Calcium: 10.2 mg/dL (ref 8.4–10.5)
Chloride: 101 mEq/L (ref 96–112)
Creatinine, Ser: 1.08 mg/dL (ref 0.40–1.20)
GFR: 54.77 mL/min — ABNORMAL LOW (ref >60.00)
Glucose, Bld: 109 mg/dL — ABNORMAL HIGH (ref 70–99)
Potassium: 4.2 mEq/L (ref 3.5–5.1)
Sodium: 138 mEq/L (ref 135–145)
Total Bilirubin: 0.6 mg/dL (ref 0.2–1.2)
Total Protein: 8.1 g/dL (ref 6.0–8.3)

## 2022-03-15 NOTE — Patient Instructions (Signed)
If you are age 63 or older, your body mass index should be between 23-30. Your Body mass index is 38.14 kg/m. If this is out of the aforementioned range listed, please consider follow up with your Primary Care Provider.  If you are age 28 or younger, your body mass index should be between 19-25. Your Body mass index is 38.14 kg/m. If this is out of the aformentioned range listed, please consider follow up with your Primary Care Provider.   ________________________________________________________  The [facility] providers would like to encourage you to use [facility] to communicate with providers for non-urgent requests or questions.  Due to long hold times on the telephone, sending your provider a message by [facility] may be a faster and more efficient way to get a response.  Please allow 48 business hours for a response.  Please remember that this is for non-urgent requests.  _______________________________________________________  Your provider has requested that you go to the basement level for lab work before leaving today. Press "B" on the elevator. The lab is located at the first door on the left as you exit the elevator.  Please do hemoccult cards and mail back or bring back to the lab at [place]  Repeat EGD/Colon in 3 years. A letter will be mailed out as it gets closer but you can call 2 months in advance to schedule.  Decrease Nexium 68m to daily  You have been scheduled for a CT scan of the abdomen and pelvis at [facility] scheduled on Monday June 19 at 830am. You should arrive 30 minutes prior to your appointment time for registration. Please follow the written instructions below on the day of your exam:  WARNING: IF YOU ARE ALLERGIC TO IODINE/X-RAY DYE, PLEASE NOTIFY RADIOLOGY IMMEDIATELY AT [phone] YOU WILL BE GIVEN A 13 HOUR PREMEDICATION PREP.  1) Do not eat or drink anything after 430am (4 hours prior to your test) 2) You have  been given 2 bottles of oral contrast to drink. The solution may taste better if refrigerated, but do NOT add ice or any other liquid to this solution. Shake well before drinking.    Drink 1 bottle of contrast @ 630am (2 hours prior to your exam)  Drink 1 bottle of contrast @ 730am (1 hour prior to your exam)  You may take any medications as prescribed with a small amount of water, if necessary. If you take any of the following medications: METFORMIN, GLUCOPHAGE, GLUCOVANCE, AVANDAMET, RIOMET, FORTAMET, AMazonMET, JANUMET, GLUMETZA or METAGLIP, you MAY be asked to HOLD this medication 48 hours AFTER the exam.  The purpose of you drinking the oral contrast is to aid in the visualization of your intestinal tract. The contrast solution may cause some diarrhea. Depending on your individual set of symptoms, you may also receive an intravenous injection of x-ray contrast/dye. Plan on being for 30 minutes or longer, depending on the type of exam you are having performed.  This test typically takes 30-45 minutes to complete.  If you have any questions regarding your exam or if you need to reschedule, you may call the CT department at [phone] the hours of 8:00 am and 5:00 pm, Monday-Friday.  ________________________________________________________________________  Thank you,  Dr. RJackquline Denmark

## 2022-03-15 NOTE — Progress Notes (Signed)
Chief Complaint: To get established  Referring Provider:  Sharilyn Sites, MD      ASSESSMENT AND PLAN;   #1. IDA (likely d/t gastric polyps/hematuria)  #2. LUQ Pain. H/O kidney stones  #3. GERD  #4. FH colon ca (dad at age 63)  #6.  Factor V Leiden mutation on Coumadin.   Plan: -Hemoccult x 3 -Recall EGD/colon 3 yrs -CT AP with and without  contrast -Decraese nexium '40mg'$  po QD x 1 month, then can try it QOD (promotes formation of gastric polyps) -Will consider VCE followed by EGD with polypectomy only if continues to have IDA req frequent IV iron/transfusions. - CBC, CMP, celiac serology today   HPI:    Jessica Reilly is a 61 y.o. 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 Laural Golden Here to get established  No significant GI complaints except for occasional heartburn, better with Nexium BID.  No odynophagia or dysphagia  Longstanding alt diarrhea and constipation.  More so constipation, better better ex-lax  C/O LUQ pain-intermittent, not always related to defecation, was worried about kidney stones.  At times becomes sharp, nonradiating.  Dx with IDA without any overt GI bleeding, s/p iron infusion, followed by Dr. Lindi Adie. Hb 10.6, MCV 81 (01/23/2022), ferritin 7, B12 173, saturation 8%.  Recent EGD/colonoscopy by Dr.Rehman as below-showing multiple gastric polyps-could be etiology for IDA.  The colonoscopy was negative except for small colon polyps.  It was recommended to repeat in 5 years.  Due to strong family history, we have changed it to 3 yr recall.  She does have hematuria.   Previous GI work-up: Colon 11/2021 ([facility] dad at age 55s) - Four 3 to 7 mm polyps in the rectum, in the sigmoid colon and in the transverse colon, removed with a cold snare. Resected and retrieved. - Anal papilla(e) were hypertrophied. - Rpt 5 yrs. changed to 3 years.  EGD 11/23/2021 - Normal hypopharynx. -  Normal esophagus. - Z-line irregular, 36 cm from the incisors. - Multiple gastric polyps. 5 were biopsied. Bx-hyperplastic. - Normal antrum. - Normal duodenal bulb and second portion of the duodenum.  Past Medical History:  Diagnosis Date   Abnormal Pap smear    Acid reflux    Bipolar disorder ([place]) 03/22/2013   Breast cancer ([place])    Breast disorder    cancer   Chronic anticoagulation 03/22/2013   Coagulopathy ([place]) 04/10/2012   Constipation    DVT of upper extremity (deep vein thrombosis) ([place]) 04/10/2012   Factor V Leiden ([place])    Factor V Leiden mutation ([place]) 04/10/2012   Family history of ovarian cancer    Fatty liver 06/04/2017   Fibroid    Gallstones    Heart murmur    High cholesterol    History of abnormal cervical Pap smear 01/06/2015   History of breast cancer 01/06/2015   HTN (hypertension)    HX: breast cancer    Hypertension 12/31/2012   Lobular carcinoma of breast, estrogen receptor positive, 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   Mental disorder    panic attacks   Osteoarthritis    Pain in both knees    Vaginal Pap smear, abnormal     Past Surgical History:  Procedure Laterality Date   BIOPSY  11/23/2021   Procedure: BIOPSY;  Surgeon: Rogene Houston, MD;  [facility];  Service: Endoscopy;;   BREAST LUMPECTOMY     COLONOSCOPY WITH ESOPHAGOGASTRODUODENOSCOPY (  EGD) N/A 07/23/2013   Procedure: COLONOSCOPY WITH ESOPHAGOGASTRODUODENOSCOPY (EGD);  Surgeon: Rogene Houston, MD;  [facility];  Service: Endoscopy;  Laterality: N/A;  125   COLONOSCOPY WITH PROPOFOL N/A 11/23/2021   Procedure: COLONOSCOPY WITH PROPOFOL;  Surgeon: Rogene Houston, MD;  [facility];  Service: Endoscopy;  Laterality: N/A;  855   ESOPHAGOGASTRODUODENOSCOPY (EGD) WITH PROPOFOL N/A 11/23/2021   Procedure: ESOPHAGOGASTRODUODENOSCOPY (EGD) WITH PROPOFOL;  Surgeon: Rogene Houston, MD;  [facility];  Service: Endoscopy;  Laterality: N/A;   LYMPH NODE BIOPSY      MASTECTOMY  bilateral   ovaries removed     POLYPECTOMY  11/23/2021   Procedure: POLYPECTOMY;  Surgeon: Rogene Houston, MD;  [facility];  Service: Endoscopy;;   TONSILLECTOMY AND ADENOIDECTOMY      Family History  Problem Relation Age of Onset   Hypertension Mother    Alcohol abuse Mother    COPD Mother    Hypertension Father    Alcohol abuse Father    Heart disease Father    Pneumonia Father    Diabetes Father    Colon cancer Father        Dx 34s; deceased 22s   Healthy Sister    Cancer Brother        leukemia 64s; deceased 62   Heart disease Brother    Cancer Brother        liver; currently 53   Cirrhosis Brother    Other Brother        waiting for liver transplant   Ovarian cancer Maternal Grandmother 37       deceased 78   Alzheimer's disease Paternal 10    OCD Daughter    Bipolar disorder Daughter    Hypertension Daughter    Schizophrenia Daughter    Schizophrenia Daughter    Bipolar disorder Daughter    OCD Daughter    Hypertension Daughter    COPD Daughter    Other Daughter        stomach don't empty out   Cancer Maternal Uncle        3 of 6 with various cancers: skin, NHL, Hodkin's Dx   ADD / ADHD Grandchild    Anxiety disorder Grandchild    Depression Grandchild    Breast cancer Cousin 33       mat first cousin related through unaffected aunt   Stomach cancer Neg Hx    Rectal cancer Neg Hx    Esophageal cancer Neg Hx     Social History   Tobacco Use   Smoking status: Never   Smokeless tobacco: Never  Vaping Use   Vaping Use: Never used  Substance Use Topics   Alcohol use: Not Currently    Comment: Rarely.   Drug use: No    Current Outpatient Medications  Medication Sig Dispense Refill   acetaminophen (TYLENOL) 500 MG tablet Take 1,000 mg by mouth every 6 (six) hours as needed for moderate pain.     alprazolam (XANAX) 2 MG tablet Take 1-2 mg by mouth 2 (two) times daily as needed for anxiety.     anastrozole  (ARIMIDEX) 1 MG tablet Take 1 tablet (1 mg total) by mouth at bedtime. 90 tablet 3   atorvastatin (LIPITOR) 40 MG tablet TAKE 1 TABLET BY MOUTH ONCE A DAY. 90 tablet 1   Calcium Carbonate-Vit D-Min (CALCIUM 600+D3 PLUS MINERALS) 600-800 MG-UNIT TABS Take 2 tablets by mouth daily.  docusate sodium (STOOL SOFTENER) 100 MG capsule Take 2 capsules (200 mg total) by mouth at bedtime. 10 capsule 0   esomeprazole (NEXIUM) 40 MG capsule Take 20 mg by mouth 2 (two) times daily before a meal.     fluticasone (FLONASE) 50 MCG/ACT nasal spray Place 2 sprays into both nostrils at bedtime.     GAVILYTE-G 236 g solution Take by mouth as directed.     glucose blood (ONETOUCH VERIO) test strip 1 each by Other route daily in the afternoon. Use as instructed 100 each 3   levocetirizine (XYZAL) 5 MG tablet TAKE ONE TABLET BY MOUTH AT BEDTIME. 30 tablet 3   Magnesium 400 MG TABS Take 400 mg by mouth daily.     metFORMIN (GLUCOPHAGE-XR) 500 MG 24 hr tablet Take 1 tablet for 1 week and then increase to 2 tablets 180 tablet 1   metoprolol (TOPROL-XL) 100 MG 24 hr tablet Take 100 mg by mouth daily.       omega-3 acid ethyl esters (LOVAZA) 1 g capsule Take 1 capsule by mouth 2 (two) times daily.     OneTouch Delica Lancets 76B MISC Check 1-2 times daily 100 each 2   promethazine (PHENERGAN) 25 MG tablet Take 1 tablet (25 mg total) by mouth every 6 (six) hours as needed for nausea. 30 tablet 3   warfarin (COUMADIN) 7.5 MG tablet TAKE 1 TABLET BY MOUTH DAILY 90 tablet 0   No current facility-administered medications for this visit.    Allergies  Allergen Reactions   Amoxicillin-Pot Clavulanate Rash   Ceftin Rash   Cefuroxime Rash   Cefuroxime Axetil Rash   Risperidone And Related Other (See Comments)    Has medical contraindications    Review of Systems:  Constitutional: Denies fever, chills, diaphoresis, appetite change and fatigue.  HEENT: Denies photophobia, eye pain, redness, hearing loss, ear pain,  congestion, sore throat, rhinorrhea, sneezing, mouth sores, neck pain, neck stiffness and tinnitus.   Respiratory: Denies SOB, DOE, cough, chest tightness,  and wheezing.   Cardiovascular: Denies chest pain, palpitations and leg swelling.  Genitourinary: Denies dysuria, urgency, frequency, hematuria, flank pain and difficulty urinating.  Musculoskeletal: Denies myalgias, back pain, joint swelling, arthralgias and gait problem.  Skin: No rash.  Neurological: Denies dizziness, seizures, syncope, weakness, light-headedness, numbness and headaches.  Hematological: Denies adenopathy. Easy bruising, personal or family bleeding history  Psychiatric/Behavioral: No anxiety or depression     Physical Exam:    BP 140/88   Pulse 65   Ht '5\' 2"'$  (1.575 m)   Wt 208 lb 8 oz (94.6 kg)   SpO2 98%   BMI 38.14 kg/m  Wt Readings from Last 3 Encounters:  03/15/22 208 lb 8 oz (94.6 kg)  03/13/22 211 lb (95.7 kg)  03/08/22 211 lb 8 oz (95.9 kg)   Constitutional:  Well-developed, in no acute distress. Psychiatric: Normal mood and affect. Behavior is normal. HEENT: Pupils normal.  Conjunctivae are normal. No scleral icterus. Neck supple.  Cardiovascular: Normal rate, regular rhythm. No edema Pulmonary/chest: Effort normal and breath sounds normal. No wheezing, rales or rhonchi. Abdominal: Soft, nondistended. Nontender. Bowel sounds active throughout. There are no masses palpable. No hepatomegaly. Rectal: Deferred Neurological: Alert and oriented to person place and time. Skin: Skin is warm and dry. No rashes noted.  Data Reviewed: I have personally reviewed following labs and imaging studies  CBC:    Latest Ref Rng & Units 01/23/2022    9:55 AM 01/20/2021   11:11 AM 05/25/2017  1:45 PM  CBC  WBC 4.0 - 10.5 K/uL 8.0   9.4   8.5    Hemoglobin 12.0 - 15.0 g/dL 10.6   11.8   13.3    Hematocrit 36.0 - 46.0 % 34.3   37.5   38.4    Platelets 150 - 400 K/uL 298   323   307      CMP:    Latest Ref  Rng & Units 01/23/2022    9:55 AM 09/05/2021   10:46 AM 06/21/2021   12:00 AM  CMP  Glucose 70 - 99 mg/dL 140      BUN 8 - 23 mg/dL 15    13       Creatinine 0.44 - 1.00 mg/dL 1.09    1.0       Sodium 135 - 145 mmol/L 137      Potassium 3.5 - 5.1 mmol/L 4.7      Chloride 98 - 111 mmol/L 102      CO2 22 - 32 mmol/L 29      Calcium 8.9 - 10.3 mg/dL 9.7    9.9       Total Protein 6.5 - 8.1 g/dL 7.6   7.3     Total Bilirubin 0.3 - 1.2 mg/dL 0.4   0.3     Alkaline Phos 38 - 126 U/L 84   103     AST 15 - 41 U/L 27   25     ALT 0 - 44 U/L 24   23        This result is from an external source.    GFR: CrCl cannot be calculated (Patient's most recent lab result is older than the maximum 21 days allowed.). Liver Function Tests: No results for input(s): AST, ALT, ALKPHOS, BILITOT, PROT, ALBUMIN in the last 168 hours. No results for input(s): LIPASE, AMYLASE in the last 168 hours. No results for input(s): AMMONIA in the last 168 hours. Coagulation Profile: No results for input(s): INR, PROTIME in the last 168 hours. HbA1C: No results for input(s): HGBA1C in the last 72 hours. Lipid Profile: No results for input(s): CHOL, HDL, LDLCALC, TRIG, CHOLHDL, LDLDIRECT in the last 72 hours. Thyroid Function Tests: No results for input(s): TSH, T4TOTAL, FREET4, T3FREE, THYROIDAB in the last 72 hours. Anemia Panel: No results for input(s): VITAMINB12, FOLATE, FERRITIN, TIBC, IRON, RETICCTPCT in the last 72 hours.  No results found for this or any previous visit (from the past 240 hour(s)).    Radiology Studies: No results found.    Carmell Austria, MD 03/15/2022, 9:01 AM  Cc: Sharilyn Sites, MD

## 2022-03-21 ENCOUNTER — Encounter: Payer: Self-pay | Admitting: Hematology and Oncology

## 2022-03-21 DIAGNOSIS — J45909 Unspecified asthma, uncomplicated: Secondary | ICD-10-CM | POA: Diagnosis not present

## 2022-03-21 DIAGNOSIS — J019 Acute sinusitis, unspecified: Secondary | ICD-10-CM | POA: Diagnosis not present

## 2022-03-21 LAB — PROTIME-INR: INR: 1.8 — AB (ref 0.80–1.20)

## 2022-03-22 DIAGNOSIS — R519 Headache, unspecified: Secondary | ICD-10-CM | POA: Diagnosis not present

## 2022-03-22 DIAGNOSIS — Z23 Encounter for immunization: Secondary | ICD-10-CM | POA: Diagnosis not present

## 2022-03-27 ENCOUNTER — Encounter (HOSPITAL_COMMUNITY): Payer: Self-pay

## 2022-03-27 ENCOUNTER — Ambulatory Visit (HOSPITAL_COMMUNITY)
Admission: RE | Admit: 2022-03-27 | Discharge: 2022-03-27 | Disposition: A | Discharge status: Home / Self Care | Payer: PPO | Source: Ambulatory Visit | Attending: Gastroenterology | Admitting: Gastroenterology

## 2022-03-27 ENCOUNTER — Other Ambulatory Visit (INDEPENDENT_AMBULATORY_CARE_PROVIDER_SITE_OTHER): Payer: PPO

## 2022-03-27 DIAGNOSIS — Z87442 Personal history of urinary calculi: Secondary | ICD-10-CM | POA: Insufficient documentation

## 2022-03-27 DIAGNOSIS — D509 Iron deficiency anemia, unspecified: Secondary | ICD-10-CM

## 2022-03-27 DIAGNOSIS — E1165 Type 2 diabetes mellitus with hyperglycemia: Secondary | ICD-10-CM | POA: Diagnosis not present

## 2022-03-27 DIAGNOSIS — K317 Polyp of stomach and duodenum: Secondary | ICD-10-CM | POA: Insufficient documentation

## 2022-03-27 DIAGNOSIS — K8020 Calculus of gallbladder without cholecystitis without obstruction: Secondary | ICD-10-CM | POA: Diagnosis not present

## 2022-03-27 DIAGNOSIS — K3189 Other diseases of stomach and duodenum: Secondary | ICD-10-CM | POA: Diagnosis not present

## 2022-03-27 DIAGNOSIS — R1012 Left upper quadrant pain: Secondary | ICD-10-CM

## 2022-03-27 DIAGNOSIS — N200 Calculus of kidney: Secondary | ICD-10-CM | POA: Diagnosis not present

## 2022-03-27 LAB — POCT I-STAT CREATININE: Creatinine, Ser: 1.2 mg/dL — ABNORMAL HIGH (ref 0.44–1.00)

## 2022-03-27 LAB — HEMOCCULT SLIDES (X 3 CARDS)
Fecal Occult Blood: NEGATIVE
OCCULT 1: POSITIVE — AB
OCCULT 2: POSITIVE — AB
OCCULT 3: POSITIVE — AB
OCCULT 4: NEGATIVE
OCCULT 5: NEGATIVE

## 2022-03-27 MED ORDER — IOHEXOL 300 MG/ML  SOLN
100.0000 mL | Freq: Once | INTRAMUSCULAR | Status: AC | PRN
  Administered 2022-03-27: 100 mL via INTRAVENOUS

## 2022-03-27 MED ORDER — SODIUM CHLORIDE (PF) 0.9 % IJ SOLN
INTRAMUSCULAR | Status: AC
  Filled 2022-03-27: qty 50

## 2022-04-12 ENCOUNTER — Other Ambulatory Visit: Payer: Self-pay

## 2022-04-12 DIAGNOSIS — D509 Iron deficiency anemia, unspecified: Secondary | ICD-10-CM

## 2022-04-14 DIAGNOSIS — Z7901 Long term (current) use of anticoagulants: Secondary | ICD-10-CM | POA: Diagnosis not present

## 2022-04-14 DIAGNOSIS — D6851 Activated protein C resistance: Secondary | ICD-10-CM | POA: Diagnosis not present

## 2022-04-19 ENCOUNTER — Other Ambulatory Visit: Payer: Self-pay | Admitting: Hematology and Oncology

## 2022-04-19 ENCOUNTER — Ambulatory Visit: Payer: PPO | Admitting: Internal Medicine

## 2022-04-19 ENCOUNTER — Encounter: Payer: Self-pay | Admitting: Internal Medicine

## 2022-04-19 VITALS — BP 132/86 | HR 63 | Ht 62.0 in | Wt 205.8 lb

## 2022-04-19 DIAGNOSIS — I82623 Acute embolism and thrombosis of deep veins of upper extremity, bilateral: Secondary | ICD-10-CM

## 2022-04-19 DIAGNOSIS — E119 Type 2 diabetes mellitus without complications: Secondary | ICD-10-CM | POA: Insufficient documentation

## 2022-04-19 DIAGNOSIS — I82622 Acute embolism and thrombosis of deep veins of left upper extremity: Secondary | ICD-10-CM

## 2022-04-19 DIAGNOSIS — I82629 Acute embolism and thrombosis of deep veins of unspecified upper extremity: Secondary | ICD-10-CM

## 2022-04-19 LAB — POCT GLYCOSYLATED HEMOGLOBIN (HGB A1C): Hemoglobin A1C: 5.6 % (ref 4.0–5.6)

## 2022-04-19 LAB — POCT GLUCOSE (DEVICE FOR HOME USE): Glucose Fasting, POC: 130 mg/dL — AB (ref 70–99)

## 2022-04-19 NOTE — Patient Instructions (Addendum)
Continue Metformin 500 mg, 2 tablets daily     

## 2022-04-19 NOTE — Progress Notes (Signed)
Name: Jessica Reilly  MRN/ DOB: [id number], [birth year]   Age/ Sex: 61 y.o., female    PCP: Sharilyn Sites, MD   Reason for Endocrinology Evaluation: Type 2 Diabetes Mellitus     Date of Initial Endocrinology Visit: 01/04/2022    PATIENT IDENTIFIER: Jessica Reilly is a 61 y.o. female with a past medical history of HTN, fatty liver, T2DM, factor V Leiden mutation , history of breast CA and bipolar disorder. The patient presented for initial endocrinology clinic visit on 01/04/2022 for consultative assistance with her diabetes management.    HPI: Jessica Reilly was    Diagnosed with DM in 2022 Hemoglobin A1c  peaking at 7.8% in 2022. On her initial visit to our clinic she had an A1c of 8.9%, she was not on any medications, we started her on metformin     SUBJECTIVE:   During the last visit (01/04/2022): A1c 8.9%, started metformin  Today (04/19/22): Jessica Reilly is here for follow-up on diabetes management.  She checks her blood sugars 1 times daily. The patient has not had hypoglycemic episodes since the last clinic visit  Has been noted with weight loss   Denies nausea, vomiting or diarrhea  She received iron infusions on 03/13/2022  She saw dermatology for a left shin rash and left dorsal foot rash , Dx with psoriasis based on Bx   She continues with a localized tingling of the left shin, most likely related to psoriasis but not DM, as with DM is symmetrical     HOME DIABETES REGIMEN: Metformin 500 mg XR twice daily     Statin: Yes ACE-I/ARB: No Prior Diabetic Education: no    METER DOWNLOAD SUMMARY: forgot it  114-130  mg/dL   DIABETIC COMPLICATIONS: Microvascular complications:   Denies: retinopathy, neuropathy , CKD  Last eye exam: Completed 11/2020  Macrovascular complications:   Denies: CAD, PVD, CVA   PAST HISTORY: Past Medical History:  Past Medical History:  Diagnosis Date   Abnormal Pap smear    Acid reflux    Bipolar disorder ([place])  03/22/2013   Breast cancer ([place])    Breast disorder    cancer   Chronic anticoagulation 03/22/2013   Coagulopathy ([place]) 04/10/2012   Constipation    DVT of upper extremity (deep vein thrombosis) ([place]) 04/10/2012   Factor V Leiden ([place])    Factor V Leiden mutation ([place]) 04/10/2012   Family history of ovarian cancer    Fatty liver 06/04/2017   Fibroid    Gallstones    Heart murmur    High cholesterol    History of abnormal cervical Pap smear 01/06/2015   History of breast cancer 01/06/2015   HTN (hypertension)    HX: breast cancer    Hypertension 12/31/2012   Lobular carcinoma of breast, estrogen receptor positive, 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   Mental disorder    panic attacks   Osteoarthritis    Reilly in both knees    Vaginal Pap smear, abnormal    Past Surgical History:  Past Surgical History:  Procedure Laterality Date   BIOPSY  11/23/2021   Procedure: BIOPSY;  Surgeon: Rogene Houston, MD;  [facility];  Service: Endoscopy;;   BREAST LUMPECTOMY     COLONOSCOPY WITH ESOPHAGOGASTRODUODENOSCOPY (EGD) N/A 07/23/2013   Procedure: COLONOSCOPY WITH ESOPHAGOGASTRODUODENOSCOPY (EGD);  Surgeon: Rogene Houston, MD;  [facility];  Service: Endoscopy;  Laterality: N/A;  125   COLONOSCOPY WITH PROPOFOL N/A 11/23/2021   Procedure: COLONOSCOPY WITH PROPOFOL;  Surgeon: Rogene Houston, MD;  [facility];  Service: Endoscopy;  Laterality: N/A;  855   ESOPHAGOGASTRODUODENOSCOPY (EGD) WITH PROPOFOL N/A 11/23/2021   Procedure: ESOPHAGOGASTRODUODENOSCOPY (EGD) WITH PROPOFOL;  Surgeon: Rogene Houston, MD;  [facility];  Service: Endoscopy;  Laterality: N/A;   LYMPH NODE BIOPSY     MASTECTOMY  bilateral   ovaries removed     POLYPECTOMY  11/23/2021   Procedure: POLYPECTOMY;  Surgeon: Rogene Houston, MD;  [facility];  Service: Endoscopy;;   TONSILLECTOMY AND ADENOIDECTOMY      Social History:  reports that she has never smoked. She has  never used smokeless tobacco. She reports that she does not currently use alcohol. She reports that she does not use drugs. Family History:  Family History  Problem Relation Age of Onset   Hypertension Mother    Alcohol abuse Mother    COPD Mother    Hypertension Father    Alcohol abuse Father    Heart disease Father    Pneumonia Father    Diabetes Father    Colon cancer Father        Dx 17s; deceased 82s   Healthy Sister    Cancer Brother        leukemia 80s; deceased 51   Heart disease Brother    Cancer Brother        liver; currently 75   Cirrhosis Brother    Other Brother        waiting for liver transplant   Ovarian cancer Maternal Grandmother 12       deceased 17   Alzheimer's disease Paternal 72    OCD Daughter    Bipolar disorder Daughter    Hypertension Daughter    Schizophrenia Daughter    Schizophrenia Daughter    Bipolar disorder Daughter    OCD Daughter    Hypertension Daughter    COPD Daughter    Other Daughter        stomach don't empty out   Cancer Maternal Uncle        3 of 6 with various cancers: skin, NHL, Hodkin's Dx   ADD / ADHD Grandchild    Anxiety disorder Grandchild    Depression Grandchild    Breast cancer Cousin 7       mat first cousin related through unaffected aunt   Stomach cancer Neg Hx    Rectal cancer Neg Hx    Esophageal cancer Neg Hx      HOME MEDICATIONS: Allergies as of 04/19/2022       Reactions   Amoxicillin-pot Clavulanate Rash   Ceftin Rash   Cefuroxime Rash   Cefuroxime Axetil Rash   Risperidone And Related Other (See Comments)   Has medical contraindications        Medication List        Accurate as of April 19, 2022  8:45 AM. If you have any questions, ask your nurse or doctor.          acetaminophen 500 MG tablet Commonly known as: TYLENOL Take 1,000 mg by mouth every 6 (six) hours as needed for moderate Reilly.   alprazolam 2 MG tablet Commonly known as: XANAX Take 1-2 mg by mouth 2  (two) times daily as needed for anxiety.   anastrozole 1 MG tablet Commonly known as: ARIMIDEX Take 1 tablet (1 mg total) by mouth at bedtime.   atorvastatin 40 MG tablet Commonly known as: LIPITOR TAKE 1 TABLET BY  MOUTH ONCE A DAY.   Calcium 600+D3 Plus Minerals 600-800 MG-UNIT Tabs Take 2 tablets by mouth daily.   docusate sodium 100 MG capsule Commonly known as: Stool Softener Take 2 capsules (200 mg total) by mouth at bedtime.   esomeprazole 40 MG capsule Commonly known as: NEXIUM Take 20 mg by mouth 2 (two) times daily before a meal.   fluticasone 50 MCG/ACT nasal spray Commonly known as: FLONASE Place 2 sprays into both nostrils at bedtime.   GaviLyte-G 236 g solution Generic drug: polyethylene glycol Take by mouth as directed.   levocetirizine 5 MG tablet Commonly known as: XYZAL TAKE ONE TABLET BY MOUTH AT BEDTIME.   Magnesium 400 MG Tabs Take 400 mg by mouth daily.   metFORMIN 500 MG 24 hr tablet Commonly known as: GLUCOPHAGE-XR Take 1 tablet for 1 week and then increase to 2 tablets   metoprolol succinate 100 MG 24 hr tablet Commonly known as: TOPROL-XL Take 100 mg by mouth daily.   omega-3 acid ethyl esters 1 g capsule Commonly known as: LOVAZA Take 1 capsule by mouth 2 (two) times daily.   OneTouch Delica Lancets 68T Misc Check 1-2 times daily   OneTouch Verio test strip Generic drug: glucose blood 1 each by Other route daily in the afternoon. Use as instructed   promethazine 25 MG tablet Commonly known as: PHENERGAN Take 1 tablet (25 mg total) by mouth every 6 (six) hours as needed for nausea.   simvastatin 40 MG tablet Commonly known as: ZOCOR Take 20 mg by mouth 2 (two) times daily.   warfarin 7.5 MG tablet Commonly known as: COUMADIN Take as directed by the anticoagulation clinic. If you are unsure how to take this medication, talk to your nurse or doctor. Original instructions: TAKE 1 TABLET BY MOUTH DAILY          ALLERGIES: Allergies  Allergen Reactions   Amoxicillin-Pot Clavulanate Rash   Ceftin Rash   Cefuroxime Rash   Cefuroxime Axetil Rash   Risperidone And Related Other (See Comments)    Has medical contraindications        OBJECTIVE:   VITAL SIGNS: BP 132/86 (BP Location: Left Arm, Patient Position: Sitting, Cuff Size: Large)   Pulse 63   Ht '5\' 2"'$  (1.575 m)   Wt 205 lb 12.8 oz (93.4 kg)   SpO2 95%   BMI 37.64 kg/m    PHYSICAL EXAM:  General: Pt appears well and is in NAD  Lungs: Clear with good BS bilat with no rales, rhonchi, or wheezes  Heart: RRR with normal S1 and S2 and no gallops; no murmurs; no rub  Abdomen: Normoactive bowel sounds, soft, nontender, without masses or organomegaly palpable  Extremities:  Lower extremities - No pretibial edema. No lesions.  Neuro: MS is good with appropriate affect, pt is alert and Ox3     DATA REVIEWED:  Lab Results  Component Value Date   HGBA1C 5.6 04/19/2022   HGBA1C 8.9 (A) 01/04/2022   HGBA1C 7.8 06/21/2021   Lab Results  Component Value Date   LDLCALC 58 09/05/2021   CREATININE 1.20 (H) 03/27/2022   No results found for: "MICRALBCREAT"  Lab Results  Component Value Date   CHOL 117 09/05/2021   HDL 38 (L) 09/05/2021   LDLCALC 58 09/05/2021   TRIG 112 09/05/2021   CHOLHDL 3.1 09/05/2021        ASSESSMENT / PLAN / RECOMMENDATIONS:   1) Type 2 Diabetes Mellitus, Without complications - Most recent A1c of  5.6 %. Goal A1c < 7.0 %.    - A1c has improved down from 8.9%, but this is partly skewed due to iron infusion - Praised the pt  on improved glycemic control and to continue with lifestyle changes   - Needs a larger needle for the lancing device, she does not recall the name, she will go home and send a portal message with the name of the machine so we can send a prescription for a larger needle - Unknown control at this time, hence no changes      MEDICATIONS: Continue  metformin 500 mg XR, 2  tablets  daily  EDUCATION / INSTRUCTIONS: BG monitoring instructions: Patient is instructed to check her blood sugars 1 times a day, fasting .   2) Diabetic complications:  Eye: Does not have known diabetic retinopathy.  Neuro/ Feet: Does not have known diabetic peripheral neuropathy. Renal: Patient does not have known baseline CKD. She is  on an ACEI/ARB at present.   3) Dyslipidemia :    -Patient on atorvastatin  Medication Continue atorvastatin 40 mg daily   F/U in 4 months   Signed electronically by: Mack Guise, MD  [facility]  [facility] [address] [place], [place] [address] Phone: [phone] FAX: [phone]   CC: Sharilyn Sites, [place] [address] Phone: [phone]  Fax: [phone]    Return to Endocrinology clinic as below: Future Appointments  Date Time Provider [place]  04/19/2022  8:50 AM Ann-Marie Kluge, Melanie Crazier, MD [facility] None  04/24/2022  8:30 AM [facility] DI[facility]NOSTIC TESTING [facility]  06/05/2022  8:00 AM [facility] None  06/08/2022 11:45 AM Nicholas Lose, MD [facility] None  01/29/2023  8:30 AM Nicholas Lose, MD [facility] None

## 2022-04-24 ENCOUNTER — Ambulatory Visit (INDEPENDENT_AMBULATORY_CARE_PROVIDER_SITE_OTHER): Payer: PPO | Admitting: Gastroenterology

## 2022-04-24 DIAGNOSIS — D509 Iron deficiency anemia, unspecified: Secondary | ICD-10-CM

## 2022-04-24 NOTE — Progress Notes (Signed)
Capsule ID: [id number] Exp: 2023-09-14 LOT: [id number]  Patient arrived for Capsule Endoscopy. Reported the prep went well. This nurse explained dietary restrictions for the next few hours. Patient verbalized understanding. Opened capsule, ensured capsule was flashing prior to the patient swallowing the capsule. Patient swallowed capsule without difficulty. Patient instructed to return to the office at 4:00 pm today for removal of the recording equipment, to call the office with any questions and if no capsule was visualized after 72 hours. No further questions by the conclusion of the visit.

## 2022-04-24 NOTE — Patient Instructions (Signed)
You may have a clear liquid diet at 10:30 am  You may have a light lunch beginning at 12:30 ( ex 1/2 sandwich and a bowl of soup)  You may resume your normal diet at 5:00 pm  If you experience any abdominal pain, nausea ,or vomiting after ingesting the capsule please call [phone] to notify the nurse  You may not have an MRI until you have confirmation that you have passed the capsule.   Please return to the office by 4 PM today to return equipment.

## 2022-05-01 ENCOUNTER — Telehealth: Payer: Self-pay | Admitting: Gastroenterology

## 2022-05-01 DIAGNOSIS — H04123 Dry eye syndrome of bilateral lacrimal glands: Secondary | ICD-10-CM | POA: Diagnosis not present

## 2022-05-01 DIAGNOSIS — E119 Type 2 diabetes mellitus without complications: Secondary | ICD-10-CM | POA: Diagnosis not present

## 2022-05-01 DIAGNOSIS — H52203 Unspecified astigmatism, bilateral: Secondary | ICD-10-CM | POA: Diagnosis not present

## 2022-05-01 DIAGNOSIS — K56609 Unspecified intestinal obstruction, unspecified as to partial versus complete obstruction: Secondary | ICD-10-CM

## 2022-05-01 NOTE — Telephone Encounter (Signed)
Dr. Lyndel Safe notified via secure chat message as well.

## 2022-05-01 NOTE — Telephone Encounter (Signed)
Received verbal orders from Dr. Lyndel Safe: Pl do X-Ray KUB 2 view. Orders placed & patient notified. Left detailed message for patient on when/where to go for XR, and if she has any questions to please call back at office number.

## 2022-05-05 ENCOUNTER — Telehealth: Payer: Self-pay | Admitting: Hematology and Oncology

## 2022-05-05 NOTE — Telephone Encounter (Signed)
Per 7/28 phone line pt called to r/s appointment.  Appointment r/s per pt request

## 2022-05-15 DIAGNOSIS — L821 Other seborrheic keratosis: Secondary | ICD-10-CM | POA: Diagnosis not present

## 2022-05-15 DIAGNOSIS — L918 Other hypertrophic disorders of the skin: Secondary | ICD-10-CM | POA: Diagnosis not present

## 2022-05-15 DIAGNOSIS — L400 Psoriasis vulgaris: Secondary | ICD-10-CM | POA: Diagnosis not present

## 2022-05-18 LAB — PROTIME-INR

## 2022-05-31 ENCOUNTER — Other Ambulatory Visit: Payer: Self-pay

## 2022-05-31 ENCOUNTER — Inpatient Hospital Stay: Payer: PPO | Attending: Hematology and Oncology

## 2022-05-31 DIAGNOSIS — D509 Iron deficiency anemia, unspecified: Secondary | ICD-10-CM | POA: Diagnosis not present

## 2022-05-31 DIAGNOSIS — F419 Anxiety disorder, unspecified: Secondary | ICD-10-CM | POA: Diagnosis not present

## 2022-05-31 DIAGNOSIS — Z6835 Body mass index (BMI) 35.0-35.9, adult: Secondary | ICD-10-CM | POA: Diagnosis not present

## 2022-05-31 DIAGNOSIS — Z72820 Sleep deprivation: Secondary | ICD-10-CM | POA: Diagnosis not present

## 2022-05-31 DIAGNOSIS — L409 Psoriasis, unspecified: Secondary | ICD-10-CM | POA: Diagnosis not present

## 2022-05-31 DIAGNOSIS — E669 Obesity, unspecified: Secondary | ICD-10-CM | POA: Diagnosis not present

## 2022-05-31 DIAGNOSIS — M2560 Stiffness of unspecified joint, not elsewhere classified: Secondary | ICD-10-CM | POA: Diagnosis not present

## 2022-05-31 LAB — CBC WITH DIFFERENTIAL (CANCER CENTER ONLY)
Abs Immature Granulocytes: 0.01 10*3/uL (ref 0.00–0.07)
Basophils Absolute: 0 10*3/uL (ref 0.0–0.1)
Basophils Relative: 0 %
Eosinophils Absolute: 0.1 10*3/uL (ref 0.0–0.5)
Eosinophils Relative: 2 %
HCT: 30.9 % — ABNORMAL LOW (ref 36.0–46.0)
Hemoglobin: 10.1 g/dL — ABNORMAL LOW (ref 12.0–15.0)
Immature Granulocytes: 0 %
Lymphocytes Relative: 32 %
Lymphs Abs: 2.2 10*3/uL (ref 0.7–4.0)
MCH: 29.1 pg (ref 26.0–34.0)
MCHC: 32.7 g/dL (ref 30.0–36.0)
MCV: 89 fL (ref 80.0–100.0)
Monocytes Absolute: 0.3 10*3/uL (ref 0.1–1.0)
Monocytes Relative: 5 %
Neutro Abs: 4.1 10*3/uL (ref 1.7–7.7)
Neutrophils Relative %: 61 %
Platelet Count: 341 10*3/uL (ref 150–400)
RBC: 3.47 MIL/uL — ABNORMAL LOW (ref 3.87–5.11)
RDW: 15.7 % — ABNORMAL HIGH (ref 11.5–15.5)
WBC Count: 6.8 10*3/uL (ref 4.0–10.5)
nRBC: 0 % (ref 0.0–0.2)

## 2022-05-31 LAB — IRON AND IRON BINDING CAPACITY (CC-WL,HP ONLY)
Iron: 18 ug/dL — ABNORMAL LOW (ref 28–170)
Saturation Ratios: 4 % — ABNORMAL LOW (ref 10.4–31.8)
TIBC: 426 ug/dL (ref 250–450)
UIBC: 408 ug/dL (ref 148–442)

## 2022-05-31 LAB — FERRITIN: Ferritin: 4 ng/mL — ABNORMAL LOW (ref 11–307)

## 2022-05-31 LAB — VITAMIN B12: Vitamin B-12: 4091 pg/mL — ABNORMAL HIGH (ref 180–914)

## 2022-06-05 ENCOUNTER — Inpatient Hospital Stay: Payer: PPO

## 2022-06-07 NOTE — Progress Notes (Signed)
HEMATOLOGY-ONCOLOGY TELEPHONE VISIT PROGRESS NOTE  I connected with our patient on 06/08/22 at 11:45 AM EDT by telephone and verified that I am speaking with the correct person using two identifiers.  I discussed the limitations, risks, security and privacy concerns of performing an evaluation and management service by telephone and the availability of in person appointments.  I also discussed with the patient that there may be a patient responsible charge related to this service. The patient expressed understanding and agreed to proceed.   History of Present Illness: Jessica Reilly is a 61 y.o.. with the following above breast cancer on anastrozole. She presents to the clinic today for a annual follow-up.  Oncology History  Malignant neoplasm of upper-outer quadrant of right breast in female, estrogen receptor positive ([place])  2008 Initial Diagnosis   T2N0, ER PR positive, HER-2 negative, invasive ductal carcinoma of the right breast.    Status post bilateral mastectomies, radiation and antiestrogen therapy since 2009 treatment was done at [facility].   2009 -  Anti-estrogen oral therapy   Anastrozole 1 mg daily.  Plan is to treat her indefinitely.  Patient understands risks and benefits.  There is no data for long-term antiestrogen therapy but she is very anxious to come off of it.     REVIEW OF SYSTEMS:   Constitutional: Denies fevers, chills or abnormal weight loss All other systems were reviewed with the patient and are negative. Observations/Objective:     Assessment Plan:  Malignant neoplasm of upper-outer quadrant of right breast in female, estrogen receptor positive ([place]) T2N0, ER PR positive, HER-2 negative, 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  Status post bilateral mastectomies at [facility], radiation, and chemotherapy CEF x5 cycles followed by radiation to the right chest   Current treatment: Anastrozole started 2009 Anastrozole toxicities:  Denies any hot flashes or myalgias.   Breast cancer surveillance: 1.  01/23/2022: Breast examination: Bilateral mastectomies, no palpable lumps or nodules of concern 2.  No role of imaging studies since she had bilateral mastectomies   Patient wishes to stay on anastrozole indefinitely. Bone density done at [facility] February 2022: T score -1.3: Osteopenia I recommended bone density every 2 years  Factor V Leiden mutation Recurrent DVTs  lab testing was done and she was found to be a heterozygote for the factor V Leiden gene mutation. She was anticoagulated and she has been kept on therapeutic Coumadin.  She does her INR checks at home. Pharmacy has been managing her INRs.     Anemia: Appears to be iron deficiency related. IV iron given in May 2023  Lab review:  05/31/2022: Ferritin 4, iron saturation 4%, B12 4091, hemoglobin 10.1, MCV 89  Recommended 3 more infusions of IV iron therapy. She sees Dr. Lyndel Safe with gastroenterology and I instructed her to call and make an appointment to be seen.  She had a capsule endoscopy and she does not know the results.  Previously she had Hemoccults that were positive.  So I suspect the anemia is caused by blood loss.  I offered her iron infusions but she does not want to do it at this time and wants to discuss with Dr. Lyndel Safe and get back to [name].  Return to clinic in 1 year for follow-up    I discussed the assessment and treatment plan with the patient. The patient was provided an opportunity to ask questions and all were answered. The patient agreed with the plan and demonstrated an understanding of the instructions.  The patient was advised to call back or seek an in-person evaluation if the symptoms worsen or if the condition fails to improve as anticipated.   I provided 15 minutes of non-face-to-face time during this encounter.  This includes time for charting and coordination of care   Harriette Ohara, MD   I Gardiner Coins am scribing  for Dr. Lindi Adie  I have reviewed the above documentation for accuracy and completeness, and I agree with the above.

## 2022-06-08 ENCOUNTER — Inpatient Hospital Stay (HOSPITAL_BASED_OUTPATIENT_CLINIC_OR_DEPARTMENT_OTHER): Payer: PPO | Admitting: Hematology and Oncology

## 2022-06-08 DIAGNOSIS — D509 Iron deficiency anemia, unspecified: Secondary | ICD-10-CM | POA: Diagnosis not present

## 2022-06-08 DIAGNOSIS — D6851 Activated protein C resistance: Secondary | ICD-10-CM

## 2022-06-08 DIAGNOSIS — Z170 Estrogen receptor positive status [ER+]: Secondary | ICD-10-CM | POA: Diagnosis not present

## 2022-06-08 DIAGNOSIS — C50411 Malignant neoplasm of upper-outer quadrant of right female breast: Secondary | ICD-10-CM

## 2022-06-08 NOTE — Telephone Encounter (Signed)
Please see note from pt Below:  Chart reviewed: Unable to locate Capsule Endoscopy notes: Please advise

## 2022-06-08 NOTE — Assessment & Plan Note (Addendum)
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 She does her INR checks at home.Pharmacy has been managing her INRs.   Anemia: Appears to be iron deficiency related. IV iron given in May 2023  Lab review:  05/31/2022: Ferritin 4, iron saturation 4%, B12 4091, hemoglobin 10.1, MCV 89  Recommended 3 more infusions of IV iron therapy. She will need a work-up with upper endoscopy and colonoscopy.  Return to clinic in 1 year for follow-up

## 2022-06-08 NOTE — Telephone Encounter (Signed)
Inbound call from patient in regards to test results from a camera pill she took a few months back. Please give a call back to further advise.  Thank you

## 2022-06-08 NOTE — Assessment & Plan Note (Signed)
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 [facility], and chemotherapyCEF x5 cyclesfollowed by radiation to the right chest  Current treatment:Anastrozole started 2009 Anastrozole toxicities:Denies any hot flashes or myalgias.  Breast cancer surveillance: 1.01/23/2022:Breast examination: Bilateral mastectomies, no palpable lumps or nodules of concern 2.No role of imaging studies since she had bilateral mastectomies  Patient wishes to stay on anastrozole indefinitely. Bone density done at [facility] February 2022: T score -1.3: Osteopenia I recommended bone density every 2 years 

## 2022-06-09 ENCOUNTER — Encounter: Payer: Self-pay | Admitting: Gastroenterology

## 2022-06-10 NOTE — Telephone Encounter (Signed)
Procedure report for capsule endoscopy scanned in under procedure tab. It showed multiple friable appearing gastric polyps.  No active bleeding.  Otherwise normal small bowel.  Plan: -If she continues to be anemic would repeat EGD with polypectomy of Coumadin at [facility] iron supplements -Trend CBC in 1 month, then every 3 months x 3. -Also if she has not seen hematology, please make referral.  RG

## 2022-06-14 NOTE — Telephone Encounter (Signed)
Left message for pt to call back  °

## 2022-06-15 ENCOUNTER — Other Ambulatory Visit: Payer: Self-pay

## 2022-06-15 DIAGNOSIS — D509 Iron deficiency anemia, unspecified: Secondary | ICD-10-CM

## 2022-06-15 NOTE — Telephone Encounter (Signed)
Pt made aware of recent results and Dr. Lyndel Safe recommendation: Orders for labs placed for 1 month: Pt made aware to come on 07/15/2022 and have labs drawn: Reminder placed in epic for future labs to be drawn: Pt questioned about hematology consult: Pt stated that she seen Dr. Lindi Adie on 06/08/2022: Chart reviewed  Office note from Dr. Lindi Adie on 06/08/2022 states:   "Anemia: Appears to be iron deficiency related. IV iron given in May 2023  Lab review:  05/31/2022: Ferritin 4, iron saturation 4%, B12 4091, hemoglobin 10.1, MCV 89  Recommended 3 more infusions of IV iron therapy. She sees Dr. Lyndel Safe with gastroenterology and I instructed her to call and make an appointment to be seen.  She had a capsule endoscopy and she does not know the results.  Previously she had Hemoccults that were positive.  So I suspect the anemia is caused by blood loss.  I offered her iron infusions but she does not want to do it at this time and wants to discuss with Dr. Lyndel Safe and get back to [name]."  Pt was made aware of what office note stated and to follow up with there office and receive the Iron Infusions as instructed previously:  Pt verbalized understanding with all questions answered.

## 2022-07-05 ENCOUNTER — Other Ambulatory Visit: Payer: Self-pay | Admitting: *Deleted

## 2022-07-05 ENCOUNTER — Telehealth: Payer: Self-pay | Admitting: *Deleted

## 2022-07-05 ENCOUNTER — Encounter: Payer: Self-pay | Admitting: Hematology and Oncology

## 2022-07-05 MED ORDER — WARFARIN SODIUM 10 MG PO TABS: 10.0000 mg | ORAL_TABLET | Freq: Every day | ORAL | 0 refills | Status: DC

## 2022-07-05 NOTE — Telephone Encounter (Signed)
Received call from pt stating INR 1.6 today.  Pt states she is currently taking Warfarin 7.5 mg p.o daily x7 days a week. Per MD pt needing to take warfarin 10 mg two times a week and 7.5 mg 5 days a week and recheck in 2 weeks.  Pt educated and verbalized understanding.

## 2022-07-11 ENCOUNTER — Telehealth: Payer: Self-pay | Admitting: Gastroenterology

## 2022-07-11 NOTE — Telephone Encounter (Signed)
Inbound call from patient requesting a call back, stated that she had done the capsule endoscopy and has never seen the capsule in her stool. Patient stated she is supposed to come for lab work tomorrow and is wanting to know if she needs to have an X-ray to see if she passed the capsule. Please advise.    [phone]

## 2022-07-11 NOTE — Telephone Encounter (Signed)
Pt stated that she had a message on her Cell Phone from several months ago that she is returning a call from. Pt stated that she had a Capsule endoscopy done on 04/24/2022 and never seen the capsule in her stool: Pt stated that she has not had any complications or abdominal pain after the Capsule Endoscopy: Pt was notified that she does not need an xray: Pt verbalized understanding with all questions answered.

## 2022-07-12 ENCOUNTER — Other Ambulatory Visit (INDEPENDENT_AMBULATORY_CARE_PROVIDER_SITE_OTHER): Payer: PPO

## 2022-07-12 DIAGNOSIS — D509 Iron deficiency anemia, unspecified: Secondary | ICD-10-CM | POA: Diagnosis not present

## 2022-07-12 LAB — CBC WITH DIFFERENTIAL/PLATELET
Basophils Absolute: 0.1 10*3/uL (ref 0.0–0.1)
Basophils Relative: 0.7 % (ref 0.0–3.0)
Eosinophils Absolute: 0.1 10*3/uL (ref 0.0–0.7)
Eosinophils Relative: 1.8 % (ref 0.0–5.0)
HCT: 32.1 % — ABNORMAL LOW (ref 36.0–46.0)
Hemoglobin: 10.4 g/dL — ABNORMAL LOW (ref 12.0–15.0)
Lymphocytes Relative: 34.8 % (ref 12.0–46.0)
Lymphs Abs: 2.6 10*3/uL (ref 0.7–4.0)
MCHC: 32.3 g/dL (ref 30.0–36.0)
MCV: 81 fl (ref 78.0–100.0)
Monocytes Absolute: 0.4 10*3/uL (ref 0.1–1.0)
Monocytes Relative: 5.4 % (ref 3.0–12.0)
Neutro Abs: 4.3 10*3/uL (ref 1.4–7.7)
Neutrophils Relative %: 57.3 % (ref 43.0–77.0)
Platelets: 319 10*3/uL (ref 150.0–400.0)
RBC: 3.96 Mil/uL (ref 3.87–5.11)
RDW: 17 % — ABNORMAL HIGH (ref 11.5–15.5)
WBC: 7.5 10*3/uL (ref 4.0–10.5)

## 2022-07-16 NOTE — Progress Notes (Signed)
HEMATOLOGY-ONCOLOGY TELEPHONE VISIT PROGRESS NOTE  I connected with our patient on 07/20/22 at 12:00 PM EDT by telephone and verified that I am speaking with the correct person using two identifiers.  I discussed the limitations, risks, security and privacy concerns of performing an evaluation and management service by telephone and the availability of in person appointments.  I also discussed with the patient that there may be a patient responsible charge related to this service. The patient expressed understanding and agreed to proceed.   History of Present Illness: History of Present Illness: Jessica Reilly is a 61 y.o.. with the following above breast cancer on anastrozole. She presents to the clinic today via phone to discuss her lab results.  She received IV iron in May 2023.  Recent blood work showed a hemoglobin of 10.4    Oncology History  Malignant neoplasm of upper-outer quadrant of right breast in female, estrogen receptor positive ([place])  2008 Initial Diagnosis   T2N0, ER PR positive, HER-2 negative, invasive ductal carcinoma of the right breast.    Status post bilateral mastectomies, radiation and antiestrogen therapy since 2009 treatment was done at [facility].   2009 -  Anti-estrogen oral therapy   Anastrozole 1 mg daily.  Plan is to treat her indefinitely.  Patient understands risks and benefits.  There is no data for long-term antiestrogen therapy but she is very anxious to come off of it.     REVIEW OF SYSTEMS:   Constitutional: Denies fevers, chills or abnormal weight loss All other systems were reviewed with the patient and are negative. 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  Status post bilateral mastectomies at [facility], radiation, and chemotherapy CEF x5  cycles followed by radiation to the right chest   Current treatment: Anastrozole started 2009 Anastrozole toxicities: Denies any hot flashes or myalgias.   Breast cancer surveillance: 1.  01/23/2022: Breast examination: Bilateral mastectomies, no palpable lumps or nodules of concern 2.  No role of imaging studies since she had bilateral mastectomies   Patient wishes to stay on anastrozole indefinitely. Bone density done at [facility] February 2022: T score -1.3: Osteopenia I recommended bone density every 2 years  Factor V Leiden mutation Recurrent DVTs  lab testing was done and she was found to be a heterozygote for the factor V Leiden gene mutation. She was anticoagulated and she has been kept on therapeutic Coumadin.  She does her INR checks at home. Pharmacy has been managing her INRs.     Anemia: Appears to be iron deficiency related. IV iron given in May 2023   Lab review:  01/26/2022: Hemoglobin 10.4, MCV 81 05/31/2022: Ferritin 4, iron saturation 4%, B12 4091, hemoglobin 10.1, MCV 89  Recommended IV iron.  But she would like to watch and monitor her labs.  We will recheck her labs at [place]  in a month and I will call her with the results.  Based on that we can decide if she needs IV iron. Malabsorption vs bleeding   I discussed the assessment and treatment plan with the patient. The patient was provided an opportunity to ask questions and all were answered. The patient agreed with the plan and demonstrated an understanding of the instructions. The patient was advised to call back or seek an in-person evaluation if the symptoms worsen or  if the condition fails to improve as anticipated.   I provided 15 minutes of non-face-to-face time during this encounter.  This includes time for charting and coordination of care   Harriette Ohara, MD  I Gardiner Coins am scribing for Dr. Lindi Adie  I have reviewed the above documentation for accuracy and completeness, and I agree with  the above.

## 2022-07-17 NOTE — Telephone Encounter (Signed)
Patient called requested a call back with lab results

## 2022-07-18 NOTE — Telephone Encounter (Signed)
Pt requesting lab results and recommendations from recent labs drawn: Please advise

## 2022-07-20 ENCOUNTER — Other Ambulatory Visit: Payer: Self-pay | Admitting: Hematology and Oncology

## 2022-07-20 ENCOUNTER — Inpatient Hospital Stay: Payer: PPO | Attending: Hematology and Oncology | Admitting: Hematology and Oncology

## 2022-07-20 DIAGNOSIS — D6851 Activated protein C resistance: Secondary | ICD-10-CM | POA: Diagnosis not present

## 2022-07-20 DIAGNOSIS — C50411 Malignant neoplasm of upper-outer quadrant of right female breast: Secondary | ICD-10-CM | POA: Diagnosis not present

## 2022-07-20 DIAGNOSIS — I82623 Acute embolism and thrombosis of deep veins of upper extremity, bilateral: Secondary | ICD-10-CM

## 2022-07-20 DIAGNOSIS — Z170 Estrogen receptor positive status [ER+]: Secondary | ICD-10-CM

## 2022-07-20 DIAGNOSIS — I82622 Acute embolism and thrombosis of deep veins of left upper extremity: Secondary | ICD-10-CM

## 2022-07-20 DIAGNOSIS — I82629 Acute embolism and thrombosis of deep veins of unspecified upper extremity: Secondary | ICD-10-CM

## 2022-07-20 MED ORDER — WARFARIN SODIUM 10 MG PO TABS: 10.0000 mg | ORAL_TABLET | Freq: Every day | ORAL | 3 refills | Status: DC

## 2022-07-20 MED ORDER — WARFARIN SODIUM 7.5 MG PO TABS: 7.5000 mg | ORAL_TABLET | Freq: Every day | ORAL | 3 refills | Status: DC

## 2022-07-20 NOTE — Assessment & Plan Note (Signed)
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 [facility], and chemotherapyCEF x5 cyclesfollowed by radiation to the right chest  Current treatment:Anastrozole started 2009 Anastrozole toxicities:Denies any hot flashes or myalgias.  Breast cancer surveillance: 1.01/23/2022:Breast examination: Bilateral mastectomies, no palpable lumps or nodules of concern 2.No role of imaging studies since she had bilateral mastectomies  Patient wishes to stay on anastrozole indefinitely. Bone density done at [facility] February 2022: T score -1.3: Osteopenia I recommended bone density every 2 years 

## 2022-07-20 NOTE — Assessment & Plan Note (Signed)
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 She does her INR checks at home.Pharmacy has been managing her INRs.   Anemia: Appears to be iron deficiency related. IV iron given in May 2023  Lab review:  05/31/2022: Ferritin 4, iron saturation 4%, B12 4091, hemoglobin 10.1, MCV 89 01/26/2022: Hemoglobin 10.4, MCV 81

## 2022-07-25 NOTE — Telephone Encounter (Signed)
Left message for pt to call back  °

## 2022-07-25 NOTE — Telephone Encounter (Signed)
Tried calling her few times.  No answer  Remo Lipps, pl let her know. VCE: neg Likely etiology of IDA-gastric polyps Hb stable at 10.4 Hence for now, continue to observe and trend CBC periodically. Avoid nonsteroidals If any further problems, then would consider gastric polypectomy off Coumadin.  RG

## 2022-07-26 NOTE — Telephone Encounter (Signed)
Pt made aware of recent results and Dr. Gupta recommendations: Pt verbalized understanding with all questions answered.   

## 2022-08-01 ENCOUNTER — Telehealth: Payer: Self-pay

## 2022-08-01 NOTE — Telephone Encounter (Signed)
Called pt to f/u on need for labs to be drawn at [place]. Pt prefers to have labs drawn at Dr Delanna Ahmadi office 08/21/22. Orders faxed per MD for CBC w/diff, Serum Iron TIBC, Ferritin to [phone], with instructions to fax results to [phone]. Fax confirmation received.

## 2022-08-07 ENCOUNTER — Telehealth: Payer: Self-pay | Admitting: Cardiovascular Disease

## 2022-08-07 NOTE — Telephone Encounter (Signed)
Called pt due to getting return mail, to update address and also make next appt with Dr. Gwenlyn Found. LVTCM.

## 2022-08-08 DIAGNOSIS — Z853 Personal history of malignant neoplasm of breast: Secondary | ICD-10-CM | POA: Diagnosis not present

## 2022-08-08 DIAGNOSIS — C50211 Malignant neoplasm of upper-inner quadrant of right female breast: Secondary | ICD-10-CM | POA: Diagnosis not present

## 2022-08-08 DIAGNOSIS — Z9221 Personal history of antineoplastic chemotherapy: Secondary | ICD-10-CM | POA: Diagnosis not present

## 2022-08-08 DIAGNOSIS — Z888 Allergy status to other drugs, medicaments and biological substances status: Secondary | ICD-10-CM | POA: Diagnosis not present

## 2022-08-08 DIAGNOSIS — Z08 Encounter for follow-up examination after completed treatment for malignant neoplasm: Secondary | ICD-10-CM | POA: Diagnosis not present

## 2022-08-08 DIAGNOSIS — Z881 Allergy status to other antibiotic agents status: Secondary | ICD-10-CM | POA: Diagnosis not present

## 2022-08-08 DIAGNOSIS — Z880 Allergy status to penicillin: Secondary | ICD-10-CM | POA: Diagnosis not present

## 2022-08-10 ENCOUNTER — Telehealth: Payer: Self-pay

## 2022-08-10 NOTE — Telephone Encounter (Signed)
Attempted to call pt to discuss prior request for her lab orders to be sent to Dr Hilma Favors as she was to see him in November. Pt called and LVM voicing confusion about where she should go. LVM for pt to call back.

## 2022-08-16 ENCOUNTER — Encounter: Payer: Self-pay | Admitting: Hematology and Oncology

## 2022-08-19 ENCOUNTER — Encounter (INDEPENDENT_AMBULATORY_CARE_PROVIDER_SITE_OTHER): Payer: Self-pay | Admitting: Gastroenterology

## 2022-08-21 DIAGNOSIS — M1991 Primary osteoarthritis, unspecified site: Secondary | ICD-10-CM | POA: Diagnosis not present

## 2022-08-21 DIAGNOSIS — M797 Fibromyalgia: Secondary | ICD-10-CM | POA: Diagnosis not present

## 2022-08-21 DIAGNOSIS — C50919 Malignant neoplasm of unspecified site of unspecified female breast: Secondary | ICD-10-CM | POA: Diagnosis not present

## 2022-08-21 DIAGNOSIS — I10 Essential (primary) hypertension: Secondary | ICD-10-CM | POA: Diagnosis not present

## 2022-08-21 DIAGNOSIS — Z6833 Body mass index (BMI) 33.0-33.9, adult: Secondary | ICD-10-CM | POA: Diagnosis not present

## 2022-08-21 DIAGNOSIS — E559 Vitamin D deficiency, unspecified: Secondary | ICD-10-CM | POA: Diagnosis not present

## 2022-08-21 DIAGNOSIS — D509 Iron deficiency anemia, unspecified: Secondary | ICD-10-CM | POA: Diagnosis not present

## 2022-08-21 DIAGNOSIS — Z0001 Encounter for general adult medical examination with abnormal findings: Secondary | ICD-10-CM | POA: Diagnosis not present

## 2022-08-21 DIAGNOSIS — Z1331 Encounter for screening for depression: Secondary | ICD-10-CM | POA: Diagnosis not present

## 2022-08-21 DIAGNOSIS — E538 Deficiency of other specified B group vitamins: Secondary | ICD-10-CM | POA: Diagnosis not present

## 2022-08-21 DIAGNOSIS — D682 Hereditary deficiency of other clotting factors: Secondary | ICD-10-CM | POA: Diagnosis not present

## 2022-08-21 DIAGNOSIS — Z9013 Acquired absence of bilateral breasts and nipples: Secondary | ICD-10-CM | POA: Diagnosis not present

## 2022-08-21 DIAGNOSIS — E785 Hyperlipidemia, unspecified: Secondary | ICD-10-CM | POA: Diagnosis not present

## 2022-08-23 NOTE — Progress Notes (Signed)
HEMATOLOGY-ONCOLOGY TELEPHONE VISIT PROGRESS NOTE  I connected with our patient on 08/24/22 at  8:00 AM EST by telephone and verified that I am speaking with the correct person using two identifiers.  I discussed the limitations, risks, security and privacy concerns of performing an evaluation and management service by telephone and the availability of in person appointments.  I also discussed with the patient that there may be a patient responsible charge related to this service. The patient expressed understanding and agreed to proceed.   History of Present Illness: No complaints related to the breast cancer on anastrozole therapy.  She connected with me by telephone to discuss the results of the recently performed blood work including her CBC and iron studies.  She does have chronic fatigue but it is no different than before.  She is intolerant to oral iron therapy.  Oncology History  Malignant neoplasm of upper-outer quadrant of right breast in female, estrogen receptor positive ([facility])  2008 Initial Diagnosis   T2N0, ER PR positive, HER-2 negative, invasive ductal carcinoma of the right breast.    Status post bilateral mastectomies, radiation and antiestrogen therapy since 2009 treatment was done at [facility].   2009 -  Anti-estrogen oral therapy   Anastrozole 1 mg daily.  Plan is to treat her indefinitely.  Patient understands risks and benefits.  There is no data for long-term antiestrogen therapy but she is very anxious to come off of it.    REVIEW OF SYSTEMS:   Constitutional: Denies fevers, chills or abnormal weight loss All other systems were reviewed with the patient and are negative. 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  Status post bilateral mastectomies at [facility],  radiation, and chemotherapy CEF x5 cycles followed by radiation to the right chest   Current treatment: Anastrozole started 2009 Anastrozole toxicities: Denies any hot flashes or myalgias.   Breast cancer surveillance: 1.  01/23/2022: Breast examination: Bilateral mastectomies, no palpable lumps or nodules of concern 2.  No role of imaging studies since she had bilateral mastectomies   Patient wishes to stay on anastrozole indefinitely. Bone density done at [facility] February 2022: T score -1.3: Osteopenia I recommended bone density every 2 years Lung nodule being monitored by Dr. Goldston with another CT chest in December 2023.  Iron deficiency anemia IV iron given in May 2023   Lab review:  01/26/2022: Hemoglobin 10.4, MCV 81 05/31/2022: Ferritin 4, iron saturation 4%, B12 4091, hemoglobin 10.1, MCV 89 (recommended IV iron but patient wanted to watch and monitor) 08/21/2022: Hemoglobin 11.2, MCV 79, RDW 15.7, TIBC 404, iron saturation 8%, ferritin 7  It appears that the iron levels have remained stable and slightly improved compared to August.  Her hemoglobin has also improved.  Therefore we will continue to watch and monitor.    Factor V Leiden mutation Recurrent DVTs  lab testing was done and she was found to be a heterozygote for the factor V Leiden gene mutation. She was anticoagulated and she has been kept on therapeutic Coumadin.  She does her INR checks at home. Pharmacy has been managing her INRs. Recent INR: 2  Recheck labs in 3 months and telephone visit to follow-up on the results.  I discussed the assessment and treatment plan with the patient. The patient was provided an opportunity to ask questions and   all were answered. The patient agreed with the plan and demonstrated an understanding of the instructions. The patient was advised to call back or seek an in-person evaluation if the symptoms worsen or if the condition fails to improve as anticipated.   I provided  12 minutes of non-face-to-face time during this encounter.  This includes time for charting and coordination of care   Harriette Ohara, MD  I Gardiner Coins am scribing for Dr. Lindi Adie  I have reviewed the above documentation for accuracy and completeness, and I agree with the above.

## 2022-08-24 ENCOUNTER — Encounter: Payer: Self-pay | Admitting: Hematology and Oncology

## 2022-08-24 ENCOUNTER — Inpatient Hospital Stay: Payer: PPO | Attending: Hematology and Oncology | Admitting: Hematology and Oncology

## 2022-08-24 DIAGNOSIS — Z170 Estrogen receptor positive status [ER+]: Secondary | ICD-10-CM | POA: Diagnosis not present

## 2022-08-24 DIAGNOSIS — D6851 Activated protein C resistance: Secondary | ICD-10-CM | POA: Diagnosis not present

## 2022-08-24 DIAGNOSIS — D509 Iron deficiency anemia, unspecified: Secondary | ICD-10-CM | POA: Diagnosis not present

## 2022-08-24 DIAGNOSIS — C50411 Malignant neoplasm of upper-outer quadrant of right female breast: Secondary | ICD-10-CM

## 2022-08-24 NOTE — Assessment & Plan Note (Signed)
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 [facility], and chemotherapyCEF x5 cyclesfollowed by radiation to the right chest  Current treatment:Anastrozole started 2009 Anastrozole toxicities:Denies any hot flashes or myalgias.  Breast cancer surveillance: 1.01/23/2022:Breast examination: Bilateral mastectomies, no palpable lumps or nodules of concern 2.No role of imaging studies since she had bilateral mastectomies  Patient wishes to stay on anastrozole indefinitely. Bone density done at [facility] February 2022: T score -1.3: Osteopenia I recommended bone density every 2 years 

## 2022-08-24 NOTE — Assessment & Plan Note (Signed)
IV iron given in May 2023   Lab review:  01/26/2022: Hemoglobin 10.4, MCV 81 05/31/2022: Ferritin 4, iron saturation 4%, B12 4091, hemoglobin 10.1, MCV 89 (recommended IV iron but patient wanted to watch and monitor) 08/21/2022: Hemoglobin 11.2, MCV 79, RDW 15.7, TIBC 404, iron saturation 8%, ferritin 7  It appears that the iron levels have remained stable and slightly improved compared to August.  Her hemoglobin has also improved.  Therefore we will continue to watch and monitor.  Recheck labs in 3 months and telephone visit to follow-up on the results.

## 2022-08-24 NOTE — Assessment & Plan Note (Signed)
Recurrent DVTs  lab testing was done and she was found to be a heterozygote for the factor V Leiden gene mutation. She was anticoagulated and she has been kept on therapeutic Coumadin.  She does her INR checks at home. Pharmacy has been managing her INRs.

## 2022-09-04 NOTE — Progress Notes (Signed)
Name: Jessica Reilly  MRN/ DOB: [id number], 1961/05/25   Age/ Sex: 61 y.o., female    PCP: Assunta Found, MD   Reason for Endocrinology Evaluation: Type 2 Diabetes Mellitus     Date of Initial Endocrinology Visit: 01/04/2022    PATIENT IDENTIFIER: Jessica Reilly is a 61 y.o. female with a past medical history of HTN, fatty liver, T2DM, factor V Leiden mutation , history of breast CA and bipolar disorder. The patient presented for initial endocrinology clinic visit on 01/04/2022 for consultative assistance with her diabetes management.    HPI: Ms. Sanden was    Diagnosed with DM in 2022 Hemoglobin A1c  peaking at 7.8% in 2022. On her initial visit to our clinic she had an A1c of 8.9%, she was not on any medications, we started her on metformin   SUBJECTIVE:   During the last visit (04/19/2022): A1c 5.6 %, started metformin     Today (09/05/22): Jessica Reilly is here for follow-up on diabetes management.  She checks her blood sugars 1 times daily. The patient has not had hypoglycemic episodes since the last clinic visit  She continues to follow with oncology for hx of breast ca. 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  Last iron infusion 03/2022  Denies diarrhea but has constipation  She has noted with weight loss  Denies nausea or vomiting    HOME DIABETES REGIMEN: Metformin 500 mg XR twice daily   Statin: Yes ACE-I/ARB: No Prior Diabetic Education: no    METER DOWNLOAD SUMMARY:  89- 126 mg/dL   DIABETIC COMPLICATIONS: Microvascular complications:   Denies: retinopathy, neuropathy , CKD  Last eye exam: Completed 11/2020  Macrovascular complications:   Denies: CAD, PVD, CVA   PAST HISTORY: Past Medical History:  Past Medical History:  Diagnosis Date   Abnormal Pap smear    Acid reflux    Bipolar disorder (HCC) 03/22/2013   Breast cancer (HCC)    Breast disorder    cancer   Chronic anticoagulation 03/22/2013   Coagulopathy (HCC) 04/10/2012   Constipation     DVT of upper extremity (deep vein thrombosis) (HCC) 04/10/2012   Factor V Leiden (HCC)    Factor V Leiden mutation (HCC) 04/10/2012   Family history of ovarian cancer    Fatty liver 06/04/2017   Fibroid    Gallstones    Heart murmur    High cholesterol    History of abnormal cervical Pap smear 01/06/2015   History of breast cancer 01/06/2015   HTN (hypertension)    HX: breast cancer    Hypertension 12/31/2012   Lobular carcinoma of breast, estrogen receptor positive, 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   Mental disorder    panic attacks   Osteoarthritis    Pain in both knees    Vaginal Pap smear, abnormal    Past Surgical History:  Past Surgical History:  Procedure Laterality Date   BIOPSY  11/23/2021   Procedure: BIOPSY;  Surgeon: Malissa Hippo, MD;  [facility];  Service: Endoscopy;;   BREAST LUMPECTOMY     COLONOSCOPY WITH ESOPHAGOGASTRODUODENOSCOPY (EGD) N/A 07/23/2013   Procedure: COLONOSCOPY WITH ESOPHAGOGASTRODUODENOSCOPY (EGD);  Surgeon: Malissa Hippo, MD;  [facility];  Service: Endoscopy;  Laterality: N/A;  125   COLONOSCOPY WITH PROPOFOL N/A 11/23/2021   Procedure: COLONOSCOPY WITH PROPOFOL;  Surgeon: Malissa Hippo, MD;  [facility];  Service: Endoscopy;  Laterality: N/A;  855   ESOPHAGOGASTRODUODENOSCOPY (EGD) WITH PROPOFOL N/A 11/23/2021   Procedure: ESOPHAGOGASTRODUODENOSCOPY (  EGD) WITH PROPOFOL;  Surgeon: Malissa Hippo, MD;  [facility];  Service: Endoscopy;  Laterality: N/A;   LYMPH NODE BIOPSY     MASTECTOMY  bilateral   ovaries removed     POLYPECTOMY  11/23/2021   Procedure: POLYPECTOMY;  Surgeon: Malissa Hippo, MD;  [facility];  Service: Endoscopy;;   TONSILLECTOMY AND ADENOIDECTOMY      Social History:  reports that she has never smoked. She has never used smokeless tobacco. She reports that she does not currently use alcohol. She reports that she does not use drugs. Family History:  Family History   Problem Relation Age of Onset   Hypertension Mother    Alcohol abuse Mother    COPD Mother    Hypertension Father    Alcohol abuse Father    Heart disease Father    Pneumonia Father    Diabetes Father    Colon cancer Father        Dx 16s; deceased 46s   Healthy Sister    Cancer Brother        leukemia 61s; deceased 58   Heart disease Brother    Cancer Brother        liver; currently 56   Cirrhosis Brother    Other Brother        waiting for liver transplant   Ovarian cancer Maternal Grandmother 66       deceased 66   Alzheimer's disease Paternal Grandmother    OCD Daughter    Bipolar disorder Daughter    Hypertension Daughter    Schizophrenia Daughter    Schizophrenia Daughter    Bipolar disorder Daughter    OCD Daughter    Hypertension Daughter    COPD Daughter    Other Daughter        stomach don't empty out   Cancer Maternal Uncle        3 of 6 with various cancers: skin, NHL, Hodkin's Dx   ADD / ADHD Grandchild    Anxiety disorder Grandchild    Depression Grandchild    Breast cancer Cousin 48       mat first cousin related through unaffected aunt   Stomach cancer Neg Hx    Rectal cancer Neg Hx    Esophageal cancer Neg Hx      HOME MEDICATIONS: Allergies as of 09/05/2022       Reactions   Amoxicillin-pot Clavulanate Rash   Ceftin Rash   Cefuroxime Rash   Cefuroxime Axetil Rash   Risperidone And Related Other (See Comments)   Has medical contraindications        Medication List        Accurate as of September 05, 2022  8:06 AM. If you have any questions, ask your nurse or doctor.          acetaminophen 500 MG tablet Commonly known as: TYLENOL Take 1,000 mg by mouth every 6 (six) hours as needed for moderate pain.   alprazolam 2 MG tablet Commonly known as: XANAX Take 1-2 mg by mouth 2 (two) times daily as needed for anxiety.   anastrozole 1 MG tablet Commonly known as: ARIMIDEX Take 1 tablet (1 mg total) by mouth at bedtime.    atorvastatin 40 MG tablet Commonly known as: LIPITOR TAKE 1 TABLET BY MOUTH ONCE A DAY.   Calcium 600+D3 Plus Minerals 600-800 MG-UNIT Tabs Take 2 tablets by mouth daily.   docusate sodium 100 MG capsule Commonly known as: Stool  Softener Take 2 capsules (200 mg total) by mouth at bedtime.   esomeprazole 40 MG capsule Commonly known as: NEXIUM Take 20 mg by mouth 2 (two) times daily before a meal.   fluticasone 50 MCG/ACT nasal spray Commonly known as: FLONASE Place 2 sprays into both nostrils at bedtime.   GaviLyte-G 236 g solution Generic drug: polyethylene glycol Take by mouth as directed.   levocetirizine 5 MG tablet Commonly known as: XYZAL TAKE ONE TABLET BY MOUTH AT BEDTIME.   Magnesium 400 MG Tabs Take 400 mg by mouth daily.   metFORMIN 500 MG 24 hr tablet Commonly known as: GLUCOPHAGE-XR Take 1 tablet for 1 week and then increase to 2 tablets   metoprolol succinate 100 MG 24 hr tablet Commonly known as: TOPROL-XL Take 100 mg by mouth daily.   omega-3 acid ethyl esters 1 g capsule Commonly known as: LOVAZA Take 1 capsule by mouth 2 (two) times daily.   OneTouch Delica Lancets 30G Misc Check 1-2 times daily   OneTouch Verio test strip Generic drug: glucose blood 1 each by Other route daily in the afternoon. Use as instructed   promethazine 25 MG tablet Commonly known as: PHENERGAN Take 1 tablet (25 mg total) by mouth every 6 (six) hours as needed for nausea.   simvastatin 40 MG tablet Commonly known as: ZOCOR Take 20 mg by mouth 2 (two) times daily.   warfarin 10 MG tablet Commonly known as: COUMADIN Take as directed by the anticoagulation clinic. If you are unsure how to take this medication, talk to your nurse or doctor. Original instructions: Take 1 tablet (10 mg total) by mouth daily. 10 mg Tuesday and Thursday   warfarin 7.5 MG tablet Commonly known as: COUMADIN Take as directed by the anticoagulation clinic. If you are unsure how to take  this medication, talk to your nurse or doctor. Original instructions: Take 1 tablet (7.5 mg total) by mouth daily.         ALLERGIES: Allergies  Allergen Reactions   Amoxicillin-Pot Clavulanate Rash   Ceftin Rash   Cefuroxime Rash   Cefuroxime Axetil Rash   Risperidone And Related Other (See Comments)    Has medical contraindications        OBJECTIVE:   VITAL SIGNS: BP 130/72 (BP Location: Left Arm, Patient Position: Sitting, Cuff Size: Large)   Pulse 76   Ht 5\' 2"  (1.575 m)   Wt 186 lb (84.4 kg)   SpO2 98%   BMI 34.02 kg/m    PHYSICAL EXAM:  General: Pt appears well and is in NAD  Lungs: Clear with good BS bilat with no rales, rhonchi, or wheezes  Heart: RRR with normal S1 and S2 and no gallops; no murmurs; no rub  Abdomen: Normoactive bowel sounds, soft, nontender, without masses or organomegaly palpable  Extremities:  Lower extremities - No pretibial edema. No lesions.  Neuro: MS is good with appropriate affect, pt is alert and Ox3     DATA REVIEWED:  Lab Results  Component Value Date   HGBA1C 5.5 09/05/2022   HGBA1C 5.6 04/19/2022   HGBA1C 8.9 (A) 01/04/2022   Lab Results  Component Value Date   LDLCALC 58 09/05/2021   CREATININE 1.20 (H) 03/27/2022   No results found for: "MICRALBCREAT"  Lab Results  Component Value Date   CHOL 117 09/05/2021   HDL 38 (L) 09/05/2021   LDLCALC 58 09/05/2021   TRIG 112 09/05/2021   CHOLHDL 3.1 09/05/2021  ASSESSMENT / PLAN / RECOMMENDATIONS:   1) Type 2 Diabetes Mellitus, Without complications - Most recent A1c of 5.5 %. Goal A1c < 7.0 %.    -A1c optimal at 5.5% -We initially discussed reducing the metformin, but we opted to continue at current dose for now I will consider reducing the dose on the next visit    MEDICATIONS: Continue  metformin 500 mg XR, 2 tablets  daily    EDUCATION / INSTRUCTIONS: BG monitoring instructions: Patient is instructed to check her blood sugars 1 times a day,  fasting .   2) Diabetic complications:  Eye: Does not have known diabetic retinopathy.  Neuro/ Feet: Does not have known diabetic peripheral neuropathy. Renal: Patient does not have known baseline CKD. She is  on an ACEI/ARB at present.    F/U in 6 months   Signed electronically by: Lyndle Herrlich, MD  [facility] Endocrinology  [facility] [address] [place] [place], [place] [address] Phone: [phone] FAX: [phone]   CC: Assunta Found, MD [address] [place] [address] Phone: [phone]  Fax: [phone]    Return to Endocrinology clinic as below: Future Appointments  Date Time Provider Department Center  09/05/2022  8:10 AM Adilee Lemme, Konrad Dolores, MD [facility] None  11/24/2022  9:00 AM Serena Croissant, MD [facility] None  01/29/2023  8:30 AM Serena Croissant, MD [facility] None

## 2022-09-05 ENCOUNTER — Ambulatory Visit (INDEPENDENT_AMBULATORY_CARE_PROVIDER_SITE_OTHER): Payer: PPO | Admitting: Internal Medicine

## 2022-09-05 ENCOUNTER — Encounter: Payer: Self-pay | Admitting: Internal Medicine

## 2022-09-05 VITALS — BP 130/72 | HR 76 | Ht 62.0 in | Wt 186.0 lb

## 2022-09-05 DIAGNOSIS — E119 Type 2 diabetes mellitus without complications: Secondary | ICD-10-CM

## 2022-09-05 LAB — POCT GLYCOSYLATED HEMOGLOBIN (HGB A1C): Hemoglobin A1C: 5.5 % (ref 4.0–5.6)

## 2022-09-05 MED ORDER — METFORMIN HCL ER 500 MG PO TB24: 1000.0000 mg | ORAL_TABLET | Freq: Every day | ORAL | 3 refills | Status: AC

## 2022-09-05 NOTE — Patient Instructions (Signed)
Continue Metformin 500 mg, 2 tablets daily

## 2022-09-25 ENCOUNTER — Telehealth: Payer: Self-pay | Admitting: *Deleted

## 2022-09-25 DIAGNOSIS — Z7901 Long term (current) use of anticoagulants: Secondary | ICD-10-CM | POA: Diagnosis not present

## 2022-09-25 DIAGNOSIS — D6851 Activated protein C resistance: Secondary | ICD-10-CM | POA: Diagnosis not present

## 2022-09-25 MED ORDER — WARFARIN SODIUM 10 MG PO TABS: 10.0000 mg | ORAL_TABLET | Freq: Every day | ORAL | 1 refills | Status: DC

## 2022-09-25 NOTE — Telephone Encounter (Signed)
Received home INR results showing INR today 1.6.  Pt states she currently takes Warfarin 7.5 mg 5 days a week and Warfarin 10 mg 2 days a week. Per MD pt needing to take Warfarin 7.5 mg three times a week and Warfarin 10 mg four times a week.  Pt educated and verbalized understanding.

## 2022-09-26 ENCOUNTER — Encounter: Payer: Self-pay | Admitting: Hematology and Oncology

## 2022-10-10 ENCOUNTER — Telehealth: Payer: Self-pay

## 2022-10-10 ENCOUNTER — Other Ambulatory Visit: Payer: Self-pay

## 2022-10-10 DIAGNOSIS — D509 Iron deficiency anemia, unspecified: Secondary | ICD-10-CM

## 2022-10-10 NOTE — Telephone Encounter (Signed)
Reminder received in Epic:  Pt made aware: Order for labs placed in EPIC: Labs placed in Epic: Pt made aware: Pt verbalized understanding with all questions answered.

## 2022-10-10 NOTE — Telephone Encounter (Signed)
Message Received: Yesterday  Personal reminder Gillermina Hu, RN  Gillermina Hu, [place] CBC in 1 month, then every 3 months x 3. Pt should have had 1 month lab drawn around oct 7: Pt will need another CBC for Jan 7  Sent 06/15/2022

## 2022-10-10 NOTE — Telephone Encounter (Signed)
Message Received: Yesterday  Personal reminder Gillermina Hu, RN  Gillermina Hu, [place] CBC in 1 month, then every 3 months x 3. Pt should have had 1 month lab drawn around oct 7: Pt will need another CBC for Jan 7  Sent 06/15/2022

## 2022-10-13 ENCOUNTER — Telehealth: Payer: Self-pay

## 2022-10-13 ENCOUNTER — Other Ambulatory Visit: Payer: Self-pay

## 2022-10-13 DIAGNOSIS — R911 Solitary pulmonary nodule: Secondary | ICD-10-CM

## 2022-10-13 NOTE — Telephone Encounter (Signed)
-----   Message from Gillermina Hu, RN sent at 04/12/2022 10:40 AM EDT ----- Regarding: CT chest in 6 months Jackquline Denmark, MD  Gillermina Hu, RN Please inform the patient.  CT [facility] no acute abnormalities except for gastric wall thickening. She does have lung lesion. Previous EGD/colon reviewed.  Hb 12.9 but heme positive stools   Plan:  -VCE (on Coumadin)  -Repeat CT chest in 6 months  -Recheck CBC in 12 weeks  Send report to family physician

## 2022-10-13 NOTE — Telephone Encounter (Signed)
Received a reminded in Epic to repeat CT chest 6 months for lung lesion: Pt was scheduled for the CT scan on 10/20/2022 at [facility] at 9:30 am . Pt to arrive at 9:15 am. Nothing but liquids 4 hours prior: Pt made aware. Pt stated that she has a Chest CT already set up through a different provider that is scheduled for Monday 10/16/2022 through [facility]: Pt was given fax number to have them fax the results to Dr. Lyndel Safe [phone] CT that was scheduled at [facility] on 10/20/2022: Pt verbalized understanding with all questions answered.

## 2022-10-16 DIAGNOSIS — C50211 Malignant neoplasm of upper-inner quadrant of right female breast: Secondary | ICD-10-CM | POA: Diagnosis not present

## 2022-10-20 ENCOUNTER — Ambulatory Visit (HOSPITAL_COMMUNITY): Payer: PPO

## 2022-10-30 ENCOUNTER — Other Ambulatory Visit (INDEPENDENT_AMBULATORY_CARE_PROVIDER_SITE_OTHER): Payer: PPO

## 2022-10-30 DIAGNOSIS — D509 Iron deficiency anemia, unspecified: Secondary | ICD-10-CM

## 2022-10-30 LAB — CBC WITH DIFFERENTIAL/PLATELET
Basophils Absolute: 0.1 10*3/uL (ref 0.0–0.1)
Basophils Relative: 1 % (ref 0.0–3.0)
Eosinophils Absolute: 0.2 10*3/uL (ref 0.0–0.7)
Eosinophils Relative: 2 % (ref 0.0–5.0)
HCT: 35.4 % — ABNORMAL LOW (ref 36.0–46.0)
Hemoglobin: 11.2 g/dL — ABNORMAL LOW (ref 12.0–15.0)
Lymphocytes Relative: 45.2 % (ref 12.0–46.0)
Lymphs Abs: 4.1 10*3/uL — ABNORMAL HIGH (ref 0.7–4.0)
MCHC: 31.6 g/dL (ref 30.0–36.0)
MCV: 75.6 fl — ABNORMAL LOW (ref 78.0–100.0)
Monocytes Absolute: 0.4 10*3/uL (ref 0.1–1.0)
Monocytes Relative: 4.7 % (ref 3.0–12.0)
Neutro Abs: 4.3 10*3/uL (ref 1.4–7.7)
Neutrophils Relative %: 47.1 % (ref 43.0–77.0)
Platelets: 360 10*3/uL (ref 150.0–400.0)
RBC: 4.69 Mil/uL (ref 3.87–5.11)
RDW: 19.2 % — ABNORMAL HIGH (ref 11.5–15.5)
WBC: 9.1 10*3/uL (ref 4.0–10.5)

## 2022-10-31 DIAGNOSIS — C50919 Malignant neoplasm of unspecified site of unspecified female breast: Secondary | ICD-10-CM | POA: Diagnosis not present

## 2022-11-15 DIAGNOSIS — Z23 Encounter for immunization: Secondary | ICD-10-CM | POA: Diagnosis not present

## 2022-11-15 DIAGNOSIS — Z7689 Persons encountering health services in other specified circumstances: Secondary | ICD-10-CM | POA: Diagnosis not present

## 2022-11-23 NOTE — Progress Notes (Signed)
HEMATOLOGY-ONCOLOGY TELEPHONE VISIT PROGRESS NOTE  I connected with our patient on 11/24/22 at  9:00 AM EST by telephone and verified that I am speaking with the correct person using two identifiers.  I discussed the limitations, risks, security and privacy concerns of performing an evaluation and management service by telephone and the availability of in person appointments.  I also discussed with the patient that there may be a patient responsible charge related to this service. The patient expressed understanding and agreed to proceed.   History of Present Illness: Jessica Reilly is a 62 y.o.. with the following above breast cancer on anastrozole. She presents to the clinic today via phone to discuss her lab results.  She reports doing for slightly tired but overall generally stable compared to last couple of months.  She thinks that she is eating better and therefore her hemoglobin has stabilized.  Oncology History  Malignant neoplasm of upper-outer quadrant of right breast in female, estrogen receptor positive ([place])  2008 Initial Diagnosis   T2N0, ER PR positive, HER-2 negative, invasive ductal carcinoma of the right breast.    Status post bilateral mastectomies, radiation and antiestrogen therapy since 2009 treatment was done at [facility].   2009 -  Anti-estrogen oral therapy   Anastrozole 1 mg daily.  Plan is to treat her indefinitely.  Patient understands risks and benefits.  There is no data for long-term antiestrogen therapy but she is very anxious to come off of it.     REVIEW OF SYSTEMS:   Constitutional: Denies fevers, chills or abnormal weight loss All other systems were reviewed with the patient and are negative. 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  Status post  bilateral mastectomies at [facility], radiation, and chemotherapy CEF x5 cycles followed by radiation to the right chest   Current treatment: Anastrozole started 2009 Anastrozole toxicities: Denies any hot flashes or myalgias.   Breast cancer surveillance: 1.  01/23/2022: Breast examination: Bilateral mastectomies, no palpable lumps or nodules of concern 2.  No role of imaging studies since she had bilateral mastectomies   Patient wishes to stay on anastrozole indefinitely. Bone density done at [facility] February 2022: T score -1.3: Osteopenia I recommended bone density every 2 years Lung nodule being monitored by Dr. Regenia Skeeter with another CT chest in December 2023.  Iron deficiency anemia IV iron given in May 2023   Lab review:  01/26/2022: Hemoglobin 10.4, MCV 81 05/31/2022: Ferritin 4, iron saturation 4%, B12 4091, hemoglobin 10.1, MCV 89 (recommended IV iron but patient wanted to watch and monitor) 08/21/2022: Hemoglobin 11.2, MCV 79, RDW 15.7, TIBC 404, iron saturation 8%, ferritin 7 10/30/2022: Hemoglobin 11.2, MCV 75.6, RDW 19.2  Patient has symptoms of fatigue but she thinks that she is stable and therefore does not want to receive any IV iron at this time.  We decided to watch and monitor with recheck of labs in 3 months and telephone visit couple days after that to discuss results.    I discussed the assessment and treatment plan with the patient. The patient was provided an opportunity to ask questions and all were answered. The patient agreed with the plan and demonstrated an understanding of the instructions. The patient was advised to call back or seek an in-person evaluation if the symptoms worsen or if the condition fails  to improve as anticipated.   I provided 12 minutes of non-face-to-face time during this encounter.  This includes time for charting and coordination of care   Harriette Ohara, MD  I Gardiner Coins am acting as a scribe for Dr.Donold Marotto  I have reviewed the above documentation for accuracy and completeness, and I agree with the above.

## 2022-11-24 ENCOUNTER — Inpatient Hospital Stay: Payer: PPO | Attending: Hematology and Oncology | Admitting: Hematology and Oncology

## 2022-11-24 DIAGNOSIS — C50411 Malignant neoplasm of upper-outer quadrant of right female breast: Secondary | ICD-10-CM

## 2022-11-24 DIAGNOSIS — Z170 Estrogen receptor positive status [ER+]: Secondary | ICD-10-CM | POA: Diagnosis not present

## 2022-11-24 DIAGNOSIS — D509 Iron deficiency anemia, unspecified: Secondary | ICD-10-CM | POA: Diagnosis not present

## 2022-11-24 NOTE — Assessment & Plan Note (Signed)
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  Status post bilateral mastectomies at [facility], radiation, and chemotherapy CEF x5 cycles followed by radiation to the right chest   Current treatment: Anastrozole started 2009 Anastrozole toxicities: Denies any hot flashes or myalgias.   Breast cancer surveillance: 1.  01/23/2022: Breast examination: Bilateral mastectomies, no palpable lumps or nodules of concern 2.  No role of imaging studies since she had bilateral mastectomies   Patient wishes to stay on anastrozole indefinitely. Bone density done at [facility] February 2022: T score -1.3: Osteopenia I recommended bone density every 2 years Lung nodule being monitored by Dr. Regenia Skeeter with another CT chest in December 2023.

## 2022-11-24 NOTE — Assessment & Plan Note (Signed)
IV iron given in May 2023   Lab review:  01/26/2022: Hemoglobin 10.4, MCV 81 05/31/2022: Ferritin 4, iron saturation 4%, B12 4091, hemoglobin 10.1, MCV 89 (recommended IV iron but patient wanted to watch and monitor) 08/21/2022: Hemoglobin 11.2, MCV 79, RDW 15.7, TIBC 404, iron saturation 8%, ferritin 7 10/30/2022: Hemoglobin 11.2, MCV 75.6, RDW 19.2  Patient has symptoms of fatigue but she thinks that she is stable and therefore does not want to receive any IV iron at this time.  We decided to watch and monitor with recheck of labs in 3 months and telephone visit couple days after that to discuss results.

## 2022-11-27 ENCOUNTER — Telehealth: Payer: Self-pay | Admitting: Hematology and Oncology

## 2022-11-27 NOTE — Telephone Encounter (Signed)
Scheduled appointment per 2/16 los. Patient is aware. Sent a message to the nurse working with Dr.Gudena so paperwork can be sent so the patient can have labs done at [place].

## 2022-11-29 ENCOUNTER — Encounter: Payer: Self-pay | Admitting: Hematology and Oncology

## 2022-12-29 ENCOUNTER — Encounter: Payer: Self-pay | Admitting: Hematology and Oncology

## 2023-01-29 ENCOUNTER — Ambulatory Visit: Payer: PPO | Admitting: Hematology and Oncology

## 2023-02-02 ENCOUNTER — Telehealth: Payer: Self-pay | Admitting: Adult Health

## 2023-02-02 NOTE — Telephone Encounter (Signed)
Patient would like you to give her a call about her urinary tract. Please advise.

## 2023-02-02 NOTE — Telephone Encounter (Signed)
Left message @ 1:15 pm. JSY 

## 2023-02-05 ENCOUNTER — Ambulatory Visit (INDEPENDENT_AMBULATORY_CARE_PROVIDER_SITE_OTHER): Payer: PPO | Admitting: Adult Health

## 2023-02-05 ENCOUNTER — Encounter: Payer: Self-pay | Admitting: Adult Health

## 2023-02-05 ENCOUNTER — Other Ambulatory Visit (HOSPITAL_COMMUNITY)
Admission: RE | Admit: 2023-02-05 | Discharge: 2023-02-05 | Disposition: A | Discharge status: Home / Self Care | Payer: PPO | Source: Ambulatory Visit | Attending: Adult Health | Admitting: Adult Health

## 2023-02-05 VITALS — BP 155/93 | HR 64 | Ht 62.0 in | Wt 204.5 lb

## 2023-02-05 DIAGNOSIS — R300 Dysuria: Secondary | ICD-10-CM | POA: Diagnosis not present

## 2023-02-05 DIAGNOSIS — Z01419 Encounter for gynecological examination (general) (routine) without abnormal findings: Secondary | ICD-10-CM | POA: Diagnosis not present

## 2023-02-05 DIAGNOSIS — R8290 Unspecified abnormal findings in urine: Secondary | ICD-10-CM | POA: Diagnosis not present

## 2023-02-05 DIAGNOSIS — Z853 Personal history of malignant neoplasm of breast: Secondary | ICD-10-CM | POA: Diagnosis not present

## 2023-02-05 DIAGNOSIS — Z1211 Encounter for screening for malignant neoplasm of colon: Secondary | ICD-10-CM

## 2023-02-05 DIAGNOSIS — Z1151 Encounter for screening for human papillomavirus (HPV): Secondary | ICD-10-CM | POA: Insufficient documentation

## 2023-02-05 LAB — HEMOCCULT GUIAC POC 1CARD (OFFICE): Fecal Occult Blood, POC: NEGATIVE

## 2023-02-05 LAB — POCT URINALYSIS DIPSTICK
Blood, UA: NEGATIVE
Glucose, UA: NEGATIVE
Ketones, UA: NEGATIVE
Leukocytes, UA: NEGATIVE
Nitrite, UA: NEGATIVE
Protein, UA: NEGATIVE

## 2023-02-05 NOTE — Telephone Encounter (Signed)
Pt has an appt today. Closing encounter. JSY

## 2023-02-05 NOTE — Progress Notes (Signed)
Patient ID: Jessica Reilly, female   DOB: Oct 10, 1960, 62 y.o.   MRN: [id number] History of Present Illness:  Jessica Reilly is a 62 year old white female, divorced, PM in for a well woman gyn exam and requests pap. She is sp breast cancer with mastectomy and BSO. She is on Arimidex.  PCP is Dr Phillips Odor.  Current Medications, Allergies, Past Medical History, Past Surgical History, Family History and Social History were reviewed in [employer] record.     Review of Systems: Patient denies any headaches, hearing loss, fatigue, blurred vision, shortness of breath, chest pain, abdominal pain, problems with bowel movements, or intercourse(she is not active). No joint pain or mood swings.  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  She has had burning with urination and pain in left side at times, and urine is cloudy, she has had kidney stones in the past.   Physical Exam:BP (!) 155/93 (BP Location: Left Arm, Patient Position: Sitting, Cuff Size: Large)   Pulse 64   Ht 5\' 2"  (1.575 m)   Wt 204 lb 8 oz (92.8 kg)   BMI 37.40 kg/m  Urine dipstick is negative  General:  Well developed, well nourished, no acute distress Skin:  Warm and dry Neck:  Midline trachea, normal thyroid, good ROM, no lymphadenopathy,no carotid bruits heard Lungs; Clear to auscultation bilaterally Breast:sp bilateral mastectomy, no masses felt on chest wall Cardiovascular: Regular rate and rhythm Abdomen:  Soft, non tender, no hepatosplenomegaly Pelvic:  External genitalia is normal in appearance, no lesions.  The vagina is pale. Urethra has no lesions or masses. The cervix is smooth, pap with HR HPV genotyping performed.  Uterus is felt to be normal size, shape, and contour.  No adnexal masses or tenderness noted.Bladder is non tender, no masses felt. Rectal: Good sphincter tone, no polyps, or hemorrhoids felt.  Hemoccult negative. Extremities/musculoskeletal:  No swelling or varicosities noted, no clubbing or  cyanosis Psych:  No mood changes, alert and cooperative,seems happy AA is 0 Fall risk is low    02/05/2023   10:32 AM 09/07/2021    1:44 PM 10/29/2018    9:07 AM  Depression screen PHQ 2/9  Decreased Interest 3 3   Down, Depressed, Hopeless 3 3   PHQ - 2 Score 6 6   Altered sleeping 3 3   Tired, decreased energy 3 3   Change in appetite 3 2   Feeling bad or failure about yourself  3 3   Trouble concentrating 3 1   Moving slowly or fidgety/restless 1 0   Suicidal thoughts 0 0   PHQ-9 Score 22 18   Difficult doing work/chores        Information is confidential and restricted. Go to Review Flowsheets to unlock data.       02/05/2023   10:33 AM 09/07/2021    1:45 PM  GAD 7 : Generalized Anxiety Score  Nervous, Anxious, on Edge 3 3  Control/stop worrying 3 3  Worry too much - different things 3 3  Trouble relaxing 3 3  Restless 2 1  Easily annoyed or irritable 3 3  Afraid - awful might happen 3 3  Total GAD 7 Score 20 19      Upstream - 02/05/23 1047       Pregnancy Intention Screening   Does the patient want to become pregnant in the next year? N/A    Does the patient's partner want to become pregnant in the next year? N/A  Would the patient like to discuss contraceptive options today? N/A      Contraception Wrap Up   Current Method Abstinence;Female Sterilization   PM   End Method Abstinence;Female Sterilization   PM   Contraception Counseling Provided No    How was the end contraceptive method provided? N/A            Examination chaperoned by Malachy Mood LPN   Impression and Plan: 1. Burning with urination UA C&S sent to rule out UTI Push fluids, water and lemon ade - POCT Urinalysis Dipstick - Urine Culture - Urinalysis, Routine w reflex microscopic  2. Cloudy urine Urine sent for UA C&S to rule out UTI  - POCT Urinalysis Dipstick - Urine Culture - Urinalysis, Routine w reflex microscopic  3. Encounter for gynecological examination with  Papanicolaou smear of cervix Pap sent Pap in 3 years if normal Physical in 1 year Colonoscopy per GI  - Cytology - PAP( [facility])  4. Encounter for screening fecal occult blood testing Hemoccult was negative  - POCT occult blood stool  5. History of breast cancer Sp mastectomy and is taking Arimidex

## 2023-02-06 LAB — CYTOLOGY - PAP
Comment: NEGATIVE
Diagnosis: NEGATIVE
High risk HPV: NEGATIVE

## 2023-02-07 LAB — URINALYSIS, ROUTINE W REFLEX MICROSCOPIC

## 2023-02-07 LAB — URINE CULTURE

## 2023-02-09 ENCOUNTER — Encounter: Payer: Self-pay | Admitting: Hematology and Oncology

## 2023-02-15 NOTE — Progress Notes (Signed)
HEMATOLOGY-ONCOLOGY TELEPHONE VISIT PROGRESS NOTE  I connected with our patient on 02/19/23 at  8:45 AM EDT by telephone and verified that I am speaking with the correct person using two identifiers.  I discussed the limitations, risks, security and privacy concerns of performing an evaluation and management service by telephone and the availability of in person appointments.  I also discussed with the patient that there may be a patient responsible charge related to this service. The patient expressed understanding and agreed to proceed.   History of Present Illness: Jessica Reilly is a 62 y.o.. with the following above breast cancer on anastrozole. She presents to the clinic today via phone to discuss her iron deficiency.  She has not done any lab work.  She does report some degree of fatigue.  Oncology History  Malignant neoplasm of upper-outer quadrant of right breast in female, estrogen receptor positive (HCC)  2008 Initial Diagnosis   T2N0, ER PR positive, HER-2 negative, invasive ductal carcinoma of the right breast.    Status post bilateral mastectomies, radiation and antiestrogen therapy since 2009 treatment was done at [facility].   2009 -  Anti-estrogen oral therapy   Anastrozole 1 mg daily.  Plan is to treat her indefinitely.  Patient understands risks and benefits.  There is no data for long-term antiestrogen therapy but she is very anxious to come off of it.     REVIEW OF SYSTEMS:   Constitutional: Denies fevers, chills or abnormal weight loss All other systems were reviewed with the patient and are negative. 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  Status post bilateral m[facility]ctomies at [facility], radiation, and chemotherapy CEF x5 cycles followed by radiation to the  right chest   Current treatment: Anastrozole started 2009 Anastrozole toxicities: Denies any hot flashes or myalgias.   Breast cancer surveillance: 1.  01/23/2022: Breast examination: Bilateral mastectomies, no palpable lumps or nodules of concern 2.  No role of imaging studies since she had bilateral mastectomies   Patient wishes to stay on anastrozole indefinitely. Bone density done at [facility] February 2022: T score -1.3: Osteopenia  Iron deficiency anemia IV iron given in May 2023   Lab review:  01/26/2022: Hemoglobin 10.4, MCV 81 05/31/2022: Ferritin 4, iron saturation 4%, B12 4091, hemoglobin 10.1, MCV 89 (recommended IV iron but patient wanted to watch and monitor) 08/21/2022: Hemoglobin 11.2, MCV 79, RDW 15.7, TIBC 404, iron saturation 8%, ferritin 7 10/30/2022: Hemoglobin 11.2, MCV 75.6, RDW 19.2 Patient has not done any lab work.  She will be coming to [facility] for some other appointments and she will let us know when she can come by to do labs.  She depends on others for transportation.   I discussed the assessment and treatment plan with the patient. The patient was provided an opportunity to ask questions and all were answered. The patient agreed with the plan and demonstrated an understanding of the instructions. The patient was advised to call back or seek an in-person evaluation if the symptoms worsen or if the condition fails to improve as anticipated.   I provided 12 minutes of non-face-to-face time during this encounter.  This includes time for charting and coordination of care   Tamsen Meek, MD  I Janan Ridge am acting as a scribe for Dr.Fritzie Prioleau  I have reviewed  the above documentation for accuracy and completeness, and I agree with the above.

## 2023-02-19 ENCOUNTER — Other Ambulatory Visit: Payer: Self-pay | Admitting: Hematology and Oncology

## 2023-02-19 ENCOUNTER — Inpatient Hospital Stay: Payer: PPO | Attending: Hematology and Oncology | Admitting: Hematology and Oncology

## 2023-02-19 DIAGNOSIS — C50411 Malignant neoplasm of upper-outer quadrant of right female breast: Secondary | ICD-10-CM | POA: Insufficient documentation

## 2023-02-19 DIAGNOSIS — Z170 Estrogen receptor positive status [ER+]: Secondary | ICD-10-CM | POA: Insufficient documentation

## 2023-02-19 DIAGNOSIS — D509 Iron deficiency anemia, unspecified: Secondary | ICD-10-CM

## 2023-02-19 DIAGNOSIS — M8580 Other specified disorders of bone density and structure, unspecified site: Secondary | ICD-10-CM | POA: Insufficient documentation

## 2023-02-19 NOTE — Assessment & Plan Note (Signed)
IV iron given in May 2023   Lab review:  01/26/2022: Hemoglobin 10.4, MCV 81 05/31/2022: Ferritin 4, iron saturation 4%, B12 4091, hemoglobin 10.1, MCV 89 (recommended IV iron but patient wanted to watch and monitor) 08/21/2022: Hemoglobin 11.2, MCV 79, RDW 15.7, TIBC 404, iron saturation 8%, ferritin 7 10/30/2022: Hemoglobin 11.2, MCV 75.6, RDW 19.2

## 2023-02-19 NOTE — Assessment & Plan Note (Signed)
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  Status post bilateral mastectomies at [facility], radiation, and chemotherapy CEF x5 cycles followed by radiation to the right chest   Current treatment: Anastrozole started 2009 Anastrozole toxicities: Denies any hot flashes or myalgias.   Breast cancer surveillance: 1.  01/23/2022: Breast examination: Bilateral mastectomies, no palpable lumps or nodules of concern 2.  No role of imaging studies since she had bilateral mastectomies   Patient wishes to stay on anastrozole indefinitely. Bone density done at [facility] February 2022: T score -1.3: Osteopenia

## 2023-03-06 ENCOUNTER — Telehealth: Payer: Self-pay | Admitting: Hematology and Oncology

## 2023-03-06 NOTE — Telephone Encounter (Signed)
Scheduled appointment per patients request. Patient is aware of the made appointment. 

## 2023-03-07 ENCOUNTER — Inpatient Hospital Stay: Payer: PPO

## 2023-03-07 ENCOUNTER — Encounter: Payer: Self-pay | Admitting: Internal Medicine

## 2023-03-07 ENCOUNTER — Ambulatory Visit: Payer: PPO | Admitting: Internal Medicine

## 2023-03-07 ENCOUNTER — Telehealth: Payer: Self-pay | Admitting: Hematology and Oncology

## 2023-03-07 VITALS — BP 136/82 | HR 88 | Ht 62.0 in | Wt 202.0 lb

## 2023-03-07 DIAGNOSIS — M8580 Other specified disorders of bone density and structure, unspecified site: Secondary | ICD-10-CM | POA: Diagnosis not present

## 2023-03-07 DIAGNOSIS — E119 Type 2 diabetes mellitus without complications: Secondary | ICD-10-CM

## 2023-03-07 DIAGNOSIS — Z7984 Long term (current) use of oral hypoglycemic drugs: Secondary | ICD-10-CM

## 2023-03-07 DIAGNOSIS — C50411 Malignant neoplasm of upper-outer quadrant of right female breast: Secondary | ICD-10-CM | POA: Diagnosis not present

## 2023-03-07 DIAGNOSIS — D509 Iron deficiency anemia, unspecified: Secondary | ICD-10-CM | POA: Diagnosis not present

## 2023-03-07 DIAGNOSIS — Z170 Estrogen receptor positive status [ER+]: Secondary | ICD-10-CM | POA: Diagnosis not present

## 2023-03-07 LAB — MICROALBUMIN / CREATININE URINE RATIO
Creatinine,U: 125.5 mg/dL
Microalb Creat Ratio: 1.1 mg/g (ref 0.0–30.0)
Microalb, Ur: 1.4 mg/dL (ref 0.0–1.9)

## 2023-03-07 LAB — CBC WITH DIFFERENTIAL (CANCER CENTER ONLY)
Abs Immature Granulocytes: 0.02 10*3/uL (ref 0.00–0.07)
Basophils Absolute: 0 10*3/uL (ref 0.0–0.1)
Basophils Relative: 1 %
Eosinophils Absolute: 0.1 10*3/uL (ref 0.0–0.5)
Eosinophils Relative: 2 %
HCT: 36.8 % (ref 36.0–46.0)
Hemoglobin: 11.9 g/dL — ABNORMAL LOW (ref 12.0–15.0)
Immature Granulocytes: 0 %
Lymphocytes Relative: 37 %
Lymphs Abs: 2.7 10*3/uL (ref 0.7–4.0)
MCH: 26.4 pg (ref 26.0–34.0)
MCHC: 32.3 g/dL (ref 30.0–36.0)
MCV: 81.6 fL (ref 80.0–100.0)
Monocytes Absolute: 0.4 10*3/uL (ref 0.1–1.0)
Monocytes Relative: 6 %
Neutro Abs: 4 10*3/uL (ref 1.7–7.7)
Neutrophils Relative %: 54 %
Platelet Count: 305 10*3/uL (ref 150–400)
RBC: 4.51 MIL/uL (ref 3.87–5.11)
RDW: 18.6 % — ABNORMAL HIGH (ref 11.5–15.5)
WBC Count: 7.3 10*3/uL (ref 4.0–10.5)
nRBC: 0 % (ref 0.0–0.2)

## 2023-03-07 LAB — POCT GLUCOSE (DEVICE FOR HOME USE): POC Glucose: 163 mg/dl — AB (ref 70–99)

## 2023-03-07 LAB — BASIC METABOLIC PANEL
BUN: 11 mg/dL (ref 6–23)
CO2: 29 mEq/L (ref 19–32)
Calcium: 10 mg/dL (ref 8.4–10.5)
Chloride: 99 mEq/L (ref 96–112)
Creatinine, Ser: 0.97 mg/dL (ref 0.40–1.20)
GFR: 61.88 mL/min (ref >60.00)
Glucose, Bld: 126 mg/dL — ABNORMAL HIGH (ref 70–99)
Potassium: 4.1 mEq/L (ref 3.5–5.1)
Sodium: 136 mEq/L (ref 135–145)

## 2023-03-07 LAB — POCT GLYCOSYLATED HEMOGLOBIN (HGB A1C): Hemoglobin A1C: 6.2 % — AB (ref 4.0–5.6)

## 2023-03-07 LAB — FERRITIN: Ferritin: 5 ng/mL — ABNORMAL LOW (ref 11–307)

## 2023-03-07 LAB — IRON AND IRON BINDING CAPACITY (CC-WL,HP ONLY)
Iron: 47 ug/dL (ref 28–170)
Saturation Ratios: 10 % — ABNORMAL LOW (ref 10.4–31.8)
TIBC: 483 ug/dL — ABNORMAL HIGH (ref 250–450)
UIBC: 436 ug/dL (ref 148–442)

## 2023-03-07 NOTE — Telephone Encounter (Signed)
Reviewed labs Recheck labs in 3 months and Telephone visit after that

## 2023-03-07 NOTE — Progress Notes (Unsigned)
Name: Jessica Reilly  MRN/ DOB: [id number], 03/24/1961   Age/ Sex: 62 y.o., female    PCP: Assunta Found, MD   Reason for Endocrinology Evaluation: Type 2 Diabetes Mellitus     Date of Initial Endocrinology Visit: 01/04/2022    PATIENT IDENTIFIER: Jessica Reilly is a 62 y.o. female with a past medical history of HTN, fatty liver, T2DM, factor V Leiden mutation , history of breast CA and bipolar disorder. The patient presented for initial endocrinology clinic visit on 01/04/2022 for consultative assistance with her diabetes management.    HPI: Jessica Reilly was    Diagnosed with DM in 2022 Hemoglobin A1c  peaking at 7.8% in 2022. On her initial visit to our clinic she had an A1c of 8.9%, she was not on any medications, we started her on metformin   SUBJECTIVE:   During the last visit (09/05/2022): A1c 5.5 %     Today (03/07/23): Jessica Reilly is here for follow-up on diabetes management.  She checks her blood sugars 1 times daily. The patient has not had hypoglycemic episodes since the last clinic visit  She continues to follow with oncology for hx of breast ca. 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  She has occasional chronic constipation Denies nausea or vomiting    HOME DIABETES REGIMEN: Metformin 500 mg XR twice daily   Statin: Yes ACE-I/ARB: No Prior Diabetic Education: no    METER DOWNLOAD SUMMARY:    DIABETIC COMPLICATIONS: Microvascular complications:   Denies: retinopathy, neuropathy , CKD  Last eye exam: scheduled 03/2023  Macrovascular complications:   Denies: CAD, PVD, CVA   PAST HISTORY: Past Medical History:  Past Medical History:  Diagnosis Date   Abnormal Pap smear    Acid reflux    Bipolar disorder (HCC) 03/22/2013   Breast cancer (HCC)    Breast disorder    cancer   Chronic anticoagulation 03/22/2013   Coagulopathy (HCC) 04/10/2012   Constipation    DVT of upper extremity (deep vein thrombosis) (HCC) 04/10/2012   Factor V Leiden (HCC)     Factor V Leiden mutation (HCC) 04/10/2012   Family history of ovarian cancer    Fatty liver 06/04/2017   Fibroid    Gallstones    Heart murmur    High cholesterol    History of abnormal cervical Pap smear 01/06/2015   History of breast cancer 01/06/2015   HTN (hypertension)    HX: breast cancer    Hypertension 12/31/2012   Lobular carcinoma of breast, estrogen receptor positive, 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   Mental disorder    panic attacks   Osteoarthritis    Pain in both knees    Psoriasis    Vaginal Pap smear, abnormal    Past Surgical History:  Past Surgical History:  Procedure Laterality Date   BIOPSY  11/23/2021   Procedure: BIOPSY;  Surgeon: Malissa Hippo, MD;  [facility];  Service: Endoscopy;;   BREAST LUMPECTOMY     COLONOSCOPY WITH ESOPHAGOGASTRODUODENOSCOPY (EGD) N/A 07/23/2013   Procedure: COLONOSCOPY WITH ESOPHAGOGASTRODUODENOSCOPY (EGD);  Surgeon: Malissa Hippo, MD;  Location: [facility];  Service: Endoscopy;  Laterality: N/A;  125   COLONOSCOPY WITH PROPOFOL N/A 11/23/2021   Procedure: COLONOSCOPY WITH PROPOFOL;  Surgeon: Malissa Hippo, MD;  Location: [facility];  Service: Endoscopy;  Laterality: N/A;  855   ESOPHAGOGASTRODUODENOSCOPY (EGD) WITH PROPOFOL N/A 11/23/2021   Procedure: ESOPHAGOGASTRODUODENOSCOPY (EGD) WITH PROPOFOL;  Surgeon: Malissa Hippo, MD;  [facility];  Service: Endoscopy;  Laterality: N/A;   LYMPH NODE BIOPSY     MASTECTOMY  bilateral   ovaries removed     POLYPECTOMY  11/23/2021   Procedure: POLYPECTOMY;  Surgeon: Malissa Hippo, MD;  [facility]: [facility];  Service: Endoscopy;;   TONSILLECTOMY AND ADENOIDECTOMY      Social History:  reports that she has never smoked. She has never used smokeless tobacco. She reports that she does not currently use alcohol. She reports that she does not use drugs. Family History:  Family History  Problem Relation Age of Onset   Hypertension Mother    Alcohol abuse  Mother    COPD Mother    Hypertension Father    Alcohol abuse Father    Heart disease Father    Pneumonia Father    Diabetes Father    Colon cancer Father        Dx 28s; deceased 15s   Healthy Sister    Cancer Brother        leukemia 17s; deceased 50   Heart disease Brother    Cancer Brother        liver; currently 70   Cirrhosis Brother    Other Brother        waiting for liver transplant   Ovarian cancer Maternal Grandmother 59       deceased 51   Alzheimer's disease Paternal Grandmother    OCD Daughter    Bipolar disorder Daughter    Hypertension Daughter    Schizophrenia Daughter    Schizophrenia Daughter    Bipolar disorder Daughter    OCD Daughter    Hypertension Daughter    COPD Daughter    Other Daughter        stomach don't empty out   Cancer Maternal Uncle        3 of 6 with various cancers: skin, NHL, Hodkin's Dx   ADD / ADHD Grandchild    Anxiety disorder Grandchild    Depression Grandchild    Breast cancer Cousin 38       mat first cousin related through unaffected aunt   Stomach cancer Neg Hx    Rectal cancer Neg Hx    Esophageal cancer Neg Hx      HOME MEDICATIONS: Allergies as of 03/07/2023       Reactions   Amoxicillin-pot Clavulanate Rash   Ceftin Rash   Cefuroxime Rash   Cefuroxime Axetil Rash   Risperidone And Related Other (See Comments)   Has medical contraindications        Medication List        Accurate as of Mar 07, 2023  8:21 AM. If you have any questions, ask your nurse or doctor.          acetaminophen 500 MG tablet Commonly known as: TYLENOL Take 1,000 mg by mouth every 6 (six) hours as needed for moderate pain.   alprazolam 2 MG tablet Commonly known as: XANAX Take 1-2 mg by mouth 2 (two) times daily as needed for anxiety.   anastrozole 1 MG tablet Commonly known as: ARIMIDEX Take 1 tablet (1 mg total) by mouth at bedtime.   atorvastatin 40 MG tablet Commonly known as: LIPITOR TAKE 1 TABLET BY MOUTH ONCE  A DAY.   Calcium 600+D3 Plus Minerals 600-800 MG-UNIT Tabs Take 2 tablets by mouth daily.   docusate sodium 100 MG capsule Commonly known as: Stool Softener Take 2 capsules (200 mg total) by mouth at bedtime.  esomeprazole 40 MG capsule Commonly known as: NEXIUM Take 20 mg by mouth 2 (two) times daily before a meal.   fluticasone 50 MCG/ACT nasal spray Commonly known as: FLONASE Place 2 sprays into both nostrils at bedtime.   levocetirizine 5 MG tablet Commonly known as: XYZAL TAKE ONE TABLET BY MOUTH AT BEDTIME.   Magnesium 400 MG Tabs Take 400 mg by mouth daily.   metFORMIN 500 MG 24 hr tablet Commonly known as: GLUCOPHAGE-XR Take 2 tablets (1,000 mg total) by mouth daily in the afternoon. Take 1 tablet for 1 week and then increase to 2 tablets   metoprolol succinate 100 MG 24 hr tablet Commonly known as: TOPROL-XL Take 100 mg by mouth daily.   omega-3 acid ethyl esters 1 g capsule Commonly known as: LOVAZA Take 1 capsule by mouth 2 (two) times daily.   OneTouch Delica Lancets 30G Misc Check 1-2 times daily   OneTouch Verio test strip Generic drug: glucose blood 1 each by Other route daily in the afternoon. Use as instructed   promethazine 25 MG tablet Commonly known as: PHENERGAN Take 1 tablet (25 mg total) by mouth every 6 (six) hours as needed for nausea.   simvastatin 40 MG tablet Commonly known as: ZOCOR Take 20 mg by mouth 2 (two) times daily.   Vtama 1 % Crea Generic drug: Tapinarof Apply topically daily.   warfarin 7.5 MG tablet Commonly known as: COUMADIN Take as directed by the anticoagulation clinic. If you are unsure how to take this medication, talk to your nurse or doctor. Original instructions: Take 1 tablet (7.5 mg total) by mouth daily.   warfarin 10 MG tablet Commonly known as: COUMADIN Take as directed by the anticoagulation clinic. If you are unsure how to take this medication, talk to your nurse or doctor. Original instructions:  Take 1 tablet (10 mg total) by mouth daily. 10 mg four times a week         ALLERGIES: Allergies  Allergen Reactions   Amoxicillin-Pot Clavulanate Rash   Ceftin Rash   Cefuroxime Rash   Cefuroxime Axetil Rash   Risperidone And Related Other (See Comments)    Has medical contraindications        OBJECTIVE:   VITAL SIGNS: BP 136/82 (BP Location: Left Arm, Patient Position: Sitting, Cuff Size: Large)   Pulse 88   Ht 5\' 2"  (1.575 m)   Wt 202 lb (91.6 kg)   SpO2 96%   BMI 36.95 kg/m    PHYSICAL EXAM:  General: Pt appears well and is in NAD  Lungs: Clear with good BS bilat  Heart: RRR   Extremities:  Lower extremities - No pretibial edema.  Neuro: MS is good with appropriate affect, pt is alert and Ox3   Dm Foot Exam 03/07/2023  The skin of the feet is intact without sores or ulcerations. The pedal pulses are 2+ on right and 2+ on left. The sensation is intact to a screening 5.07, 10 gram monofilament bilaterally   DATA REVIEWED:  Lab Results  Component Value Date   HGBA1C 5.5 09/05/2022   HGBA1C 5.6 04/19/2022   HGBA1C 8.9 (A) 01/04/2022    Latest Reference Range & Units 03/07/23 08:49  Sodium 135 - 145 mEq/L 136  Potassium 3.5 - 5.1 mEq/L 4.1  Chloride 96 - 112 mEq/L 99  CO2 19 - 32 mEq/L 29  Glucose 70 - 99 mg/dL 161 (H)  BUN 6 - 23 mg/dL 11  Creatinine 0.96 - 0.45 mg/dL 4.09  Calcium 8.4 - 10.5 mg/dL 16.1  GFR >09.60 mL/min 61.88  (H): Data is abnormally high   ASSESSMENT / PLAN / RECOMMENDATIONS:   1) Type 2 Diabetes Mellitus, Without complications - Most recent A1c of 6.2 %. Goal A1c < 7.0 %.    -A1c optimal at 6.2% -A1c has increased from 5.5% to 6.2%, patient does admit to dietary indiscretions -We discussed switching metformin to a different diabetic agent with renal protection given low GFR in the past -I have offered SGLT2 inhibitors, patient is very skeptical, so commercial online, her about side effects to include cancer, heart disease  etc..  Her daughter is getting ready to start what sounds like a GLP-1 agonist, she is interested in knowing more about it, I did explain to the patient given CKD, the preference is to go first with SGLT2 inhibitors, but if she opts to go with GLP-1 agonist that is still better than what she is on right now -BMP shows normalization of GFR  MEDICATIONS: Continue  metformin 500 mg XR, 2 tablets  daily    EDUCATION / INSTRUCTIONS: BG monitoring instructions: Patient is instructed to check her blood sugars 1 times a day, fasting .   2) Diabetic complications:  Eye: Does not have known diabetic retinopathy.  Neuro/ Feet: Does not have known diabetic peripheral neuropathy. Renal: Patient does not have known baseline CKD. She is  on an ACEI/ARB at present.    F/U in 6 months    I spent 30 minutes preparing to see the patient by review of recent labs, imaging and procedures, obtaining and reviewing separately obtained history, communicating with the patient, ordering medications, tests or procedures, and documenting clinical information in the EHR including the differential Dx, treatment, and any further evaluation and other management   Signed electronically by: Lyndle Herrlich, MD  [facility] Endocrinology  [facility] [address] [place] [place], [place] [address] Phone: [phone] FAX: [phone]   CC: Assunta Found, MD [address] [place] [address] Phone: [phone]  Fax: [phone]    Return to Endocrinology clinic as below: Future Appointments  Date Time Provider Department Center  03/07/2023  9:15 AM [facility] None

## 2023-03-07 NOTE — Patient Instructions (Addendum)
Look up Ozempic , Jardiance or [place]

## 2023-03-08 ENCOUNTER — Telehealth: Payer: Self-pay | Admitting: Internal Medicine

## 2023-03-08 NOTE — Telephone Encounter (Signed)
Patient does not use the portal, please contact the patient and let her know that her kidney function has normalized    During her office visit we discussed add-on therapy   Patient was interested in once weekly injection such as Ozempic, as this would help her with weight loss as well as improve her sugar even more and give her more protection to protect kidney and heart   I will be happy to prescribe Ozempic if she would like me to, as she was very skeptical about side effects yesterday

## 2023-03-09 NOTE — Telephone Encounter (Signed)
Patient advised of results and she will do some more research on Ozempic and give [name] a callback.

## 2023-03-12 ENCOUNTER — Telehealth: Payer: Self-pay | Admitting: Hematology and Oncology

## 2023-03-12 DIAGNOSIS — Z780 Asymptomatic menopausal state: Secondary | ICD-10-CM | POA: Diagnosis not present

## 2023-03-12 DIAGNOSIS — M8580 Other specified disorders of bone density and structure, unspecified site: Secondary | ICD-10-CM | POA: Diagnosis not present

## 2023-03-12 DIAGNOSIS — C50919 Malignant neoplasm of unspecified site of unspecified female breast: Secondary | ICD-10-CM | POA: Diagnosis not present

## 2023-03-12 DIAGNOSIS — M8589 Other specified disorders of bone density and structure, multiple sites: Secondary | ICD-10-CM | POA: Diagnosis not present

## 2023-03-12 DIAGNOSIS — Z13820 Encounter for screening for osteoporosis: Secondary | ICD-10-CM | POA: Diagnosis not present

## 2023-03-12 NOTE — Telephone Encounter (Signed)
Scheduled appointments per scheduling per Dr.Gudena. Left voicemail.

## 2023-03-19 DIAGNOSIS — E119 Type 2 diabetes mellitus without complications: Secondary | ICD-10-CM | POA: Diagnosis not present

## 2023-03-19 DIAGNOSIS — H2512 Age-related nuclear cataract, left eye: Secondary | ICD-10-CM | POA: Diagnosis not present

## 2023-03-19 DIAGNOSIS — H524 Presbyopia: Secondary | ICD-10-CM | POA: Diagnosis not present

## 2023-03-19 DIAGNOSIS — H26491 Other secondary cataract, right eye: Secondary | ICD-10-CM | POA: Diagnosis not present

## 2023-03-20 DIAGNOSIS — Z7901 Long term (current) use of anticoagulants: Secondary | ICD-10-CM | POA: Diagnosis not present

## 2023-03-20 DIAGNOSIS — D6851 Activated protein C resistance: Secondary | ICD-10-CM | POA: Diagnosis not present

## 2023-03-20 LAB — POCT INR
INR: 2.2 (ref 2.0–3.0)
POC INR: 2.2

## 2023-03-21 ENCOUNTER — Encounter: Payer: Self-pay | Admitting: Hematology and Oncology

## 2023-03-25 ENCOUNTER — Other Ambulatory Visit: Payer: Self-pay | Admitting: Hematology and Oncology

## 2023-03-26 DIAGNOSIS — L400 Psoriasis vulgaris: Secondary | ICD-10-CM | POA: Diagnosis not present

## 2023-04-30 ENCOUNTER — Encounter: Payer: Self-pay | Admitting: Hematology and Oncology

## 2023-05-01 DIAGNOSIS — Z20828 Contact with and (suspected) exposure to other viral communicable diseases: Secondary | ICD-10-CM | POA: Diagnosis not present

## 2023-05-01 DIAGNOSIS — R6889 Other general symptoms and signs: Secondary | ICD-10-CM | POA: Diagnosis not present

## 2023-05-01 DIAGNOSIS — J019 Acute sinusitis, unspecified: Secondary | ICD-10-CM | POA: Diagnosis not present

## 2023-05-01 DIAGNOSIS — Z6837 Body mass index (BMI) 37.0-37.9, adult: Secondary | ICD-10-CM | POA: Diagnosis not present

## 2023-05-01 DIAGNOSIS — E6609 Other obesity due to excess calories: Secondary | ICD-10-CM | POA: Diagnosis not present

## 2023-06-04 ENCOUNTER — Telehealth: Payer: Self-pay | Admitting: Hematology and Oncology

## 2023-06-04 NOTE — Telephone Encounter (Signed)
Rescheduled appointment per provider PAL. Patient is aware of the changes made to her upcoming appointment. 

## 2023-06-06 ENCOUNTER — Inpatient Hospital Stay: Payer: PPO

## 2023-06-08 ENCOUNTER — Telehealth: Payer: PPO | Admitting: Hematology and Oncology

## 2023-06-25 ENCOUNTER — Inpatient Hospital Stay: Payer: PPO | Attending: Internal Medicine

## 2023-06-25 DIAGNOSIS — D509 Iron deficiency anemia, unspecified: Secondary | ICD-10-CM | POA: Diagnosis not present

## 2023-06-25 DIAGNOSIS — Z170 Estrogen receptor positive status [ER+]: Secondary | ICD-10-CM | POA: Diagnosis not present

## 2023-06-25 DIAGNOSIS — Z9013 Acquired absence of bilateral breasts and nipples: Secondary | ICD-10-CM | POA: Diagnosis not present

## 2023-06-25 DIAGNOSIS — C50411 Malignant neoplasm of upper-outer quadrant of right female breast: Secondary | ICD-10-CM | POA: Insufficient documentation

## 2023-06-25 DIAGNOSIS — Z79811 Long term (current) use of aromatase inhibitors: Secondary | ICD-10-CM | POA: Diagnosis not present

## 2023-06-25 LAB — CBC WITH DIFFERENTIAL (CANCER CENTER ONLY)
Abs Immature Granulocytes: 0.02 10*3/uL (ref 0.00–0.07)
Basophils Absolute: 0 10*3/uL (ref 0.0–0.1)
Basophils Relative: 1 %
Eosinophils Absolute: 0.2 10*3/uL (ref 0.0–0.5)
Eosinophils Relative: 2 %
HCT: 39 % (ref 36.0–46.0)
Hemoglobin: 12.5 g/dL (ref 12.0–15.0)
Immature Granulocytes: 0 %
Lymphocytes Relative: 45 %
Lymphs Abs: 2.9 10*3/uL (ref 0.7–4.0)
MCH: 28.3 pg (ref 26.0–34.0)
MCHC: 32.1 g/dL (ref 30.0–36.0)
MCV: 88.4 fL (ref 80.0–100.0)
Monocytes Absolute: 0.3 10*3/uL (ref 0.1–1.0)
Monocytes Relative: 5 %
Neutro Abs: 3 10*3/uL (ref 1.7–7.7)
Neutrophils Relative %: 47 %
Platelet Count: 245 10*3/uL (ref 150–400)
RBC: 4.41 MIL/uL (ref 3.87–5.11)
RDW: 16.7 % — ABNORMAL HIGH (ref 11.5–15.5)
WBC Count: 6.4 10*3/uL (ref 4.0–10.5)
nRBC: 0 % (ref 0.0–0.2)

## 2023-06-25 LAB — IRON AND IRON BINDING CAPACITY (CC-WL,HP ONLY)
Iron: 59 ug/dL (ref 28–170)
Saturation Ratios: 13 % (ref 10.4–31.8)
TIBC: 444 ug/dL (ref 250–450)
UIBC: 385 ug/dL (ref 148–442)

## 2023-06-25 LAB — FERRITIN: Ferritin: 8 ng/mL — ABNORMAL LOW (ref 11–307)

## 2023-06-27 ENCOUNTER — Inpatient Hospital Stay (HOSPITAL_BASED_OUTPATIENT_CLINIC_OR_DEPARTMENT_OTHER): Payer: PPO | Admitting: Hematology and Oncology

## 2023-06-27 DIAGNOSIS — D509 Iron deficiency anemia, unspecified: Secondary | ICD-10-CM

## 2023-06-27 DIAGNOSIS — Z170 Estrogen receptor positive status [ER+]: Secondary | ICD-10-CM | POA: Diagnosis not present

## 2023-06-27 DIAGNOSIS — C50411 Malignant neoplasm of upper-outer quadrant of right female breast: Secondary | ICD-10-CM | POA: Diagnosis not present

## 2023-06-27 NOTE — Assessment & Plan Note (Signed)
V iron given in May 2023   Lab review:  01/26/2022: Hemoglobin 10.4, MCV 81 05/31/2022: Ferritin 4, iron saturation 4%, B12 4091, hemoglobin 10.1, MCV 89 (recommended IV iron but patient wanted to watch and monitor) 08/21/2022: Hemoglobin 11.2, MCV 79, RDW 15.7, TIBC 404, iron saturation 8%, ferritin 7 10/30/2022: Hemoglobin 11.2, MCV 75.6, RDW 19.2 06/25/2023: Hemoglobin 12.5, MCV 88.4, ferritin 8, iron saturation 13%  I discussed with the patient that the ferritin is still on the lower side and that she would need to continue to take orally iron

## 2023-06-27 NOTE — Assessment & Plan Note (Signed)
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  Status post bilateral mastectomies at [facility], radiation, and chemotherapy CEF x5 cycles followed by radiation to the right chest   Current treatment: Anastrozole started 2009 Anastrozole toxicities: Denies any hot flashes or myalgias.   Breast cancer surveillance: 1.  01/23/2022: Breast examination: Bilateral mastectomies, no palpable lumps or nodules of concern 2.  No role of imaging studies since she had bilateral mastectomies Bone density done at [facility] February 2022: T score -1.3: Osteopenia  Patient wishes to stay on anastrozole indefinitely.

## 2023-06-27 NOTE — Progress Notes (Signed)
HEMATOLOGY-ONCOLOGY TELEPHONE VISIT PROGRESS NOTE  I connected with our patient on 06/27/23 at  9:45 AM EDT by telephone and verified that I am speaking with the correct person using two identifiers.  I discussed the limitations, risks, security and privacy concerns of performing an evaluation and management service by telephone and the availability of in person appointments.  I also discussed with the patient that there may be a patient responsible charge related to this service. The patient expressed understanding and agreed to proceed.   History of Present Illness: Patient feels chronic fatigue but previously when she received iron she did not have any improvement.  Oncology History  Malignant neoplasm of upper-outer quadrant of right breast in female, estrogen receptor positive (HCC)  2008 Initial Diagnosis   T2N0, ER PR positive, HER-2 negative, invasive ductal carcinoma of the right breast.    Status post bilateral mastectomies, radiation and antiestrogen therapy since 2009 treatment was done at [facility].   2009 -  Anti-estrogen oral therapy   Anastrozole 1 mg daily.  Plan is to treat her indefinitely.  Patient understands risks and benefits.  There is no data for long-term antiestrogen therapy but she is very anxious to come off of it.     REVIEW OF SYSTEMS:   Constitutional: Denies fevers, chills or abnormal weight loss All other systems were reviewed with the patient and are negative. 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  Status post bilateral mastectomies at [facility], radiation, and chemotherapy CEF x5 cycles followed by radiation to the right chest   Current treatment: Anastrozole started 2009 Anastrozole toxicities: Denies any hot flashes or myalgias.   Breast  cancer surveillance: 1.  01/23/2022: Breast examination: Bilateral mastectomies, no palpable lumps or nodules of concern 2.  No role of imaging studies since she had bilateral mastectomies Bone density done at [facility] February 2022: T score -1.3: Osteopenia  Patient wishes to stay on anastrozole indefinitely.   Iron deficiency anemia IV iron given in May 2023   Lab review:  01/26/2022: Hemoglobin 10.4, MCV 81 05/31/2022: Ferritin 4, iron saturation 4%, B12 4091, hemoglobin 10.1, MCV 89 (recommended IV iron but patient wanted to watch and monitor) 08/21/2022: Hemoglobin 11.2, MCV 79, RDW 15.7, TIBC 404, iron saturation 8%, ferritin 7 10/30/2022: Hemoglobin 11.2, MCV 75.6, RDW 19.2 06/25/2023: Hemoglobin 12.5, MCV 88.4, ferritin 8, iron saturation 13%  I discussed with the patient that the ferritin is still on the lower side and that she would need to continue to take orally iron Recheck labs in 6 months and telephone visit after that to discuss results.  I will see the patient in person in 1 year  I discussed the assessment and treatment plan with the patient. The patient was provided an opportunity to ask questions and all were answered. The patient agreed with the plan and demonstrated an understanding of the instructions. The patient was advised to call back or seek an in-person evaluation if the symptoms worsen or if the condition fails to improve as anticipated.   I provided 12 minutes of non-face-to-face time during this encounter.  This includes time for charting and coordination of care   Tamsen Meek, MD

## 2023-07-04 ENCOUNTER — Other Ambulatory Visit: Payer: Self-pay | Admitting: Hematology and Oncology

## 2023-07-20 ENCOUNTER — Ambulatory Visit (INDEPENDENT_AMBULATORY_CARE_PROVIDER_SITE_OTHER): Payer: PPO | Admitting: Otolaryngology

## 2023-07-20 ENCOUNTER — Encounter (INDEPENDENT_AMBULATORY_CARE_PROVIDER_SITE_OTHER): Payer: Self-pay

## 2023-07-20 VITALS — Ht 62.0 in | Wt 210.0 lb

## 2023-07-20 DIAGNOSIS — J343 Hypertrophy of nasal turbinates: Secondary | ICD-10-CM

## 2023-07-20 DIAGNOSIS — J310 Chronic rhinitis: Secondary | ICD-10-CM

## 2023-07-20 DIAGNOSIS — R0982 Postnasal drip: Secondary | ICD-10-CM | POA: Diagnosis not present

## 2023-07-20 DIAGNOSIS — H6123 Impacted cerumen, bilateral: Secondary | ICD-10-CM | POA: Diagnosis not present

## 2023-07-22 DIAGNOSIS — R0982 Postnasal drip: Secondary | ICD-10-CM | POA: Insufficient documentation

## 2023-07-22 DIAGNOSIS — H6123 Impacted cerumen, bilateral: Secondary | ICD-10-CM | POA: Insufficient documentation

## 2023-07-22 DIAGNOSIS — J343 Hypertrophy of nasal turbinates: Secondary | ICD-10-CM | POA: Insufficient documentation

## 2023-07-22 DIAGNOSIS — J310 Chronic rhinitis: Secondary | ICD-10-CM | POA: Insufficient documentation

## 2023-07-22 NOTE — Progress Notes (Signed)
Patient ID: Jessica Reilly, female   DOB: May 20, 1961, 62 y.o.   MRN: [id number]  Cc: Chronic nasal congestion, headaches, postnasal drainage  HPI: The patient is a 62 year old female who presents today complaining of chronic nasal congestion, recurrent headaches, and chronic postnasal drainage.  She has been symptomatic for the past year.  She also complains of recurrent sinusitis.  She had 2 sinus infections over the past year.  She was treated with antibiotics.  Currently she is on Flonase nasal spray.  She denies any fever or visual change.  Exam: General: Communicates without difficulty, well nourished, no acute distress. Head: Normocephalic, no evidence injury, no tenderness, facial buttresses intact without stepoff. Face/sinus: No tenderness to palpation and percussion. Facial movement is normal and symmetric. Eyes: PERRL, EOMI. No scleral icterus, conjunctivae clear. Neuro: CN II exam reveals vision grossly intact.  No nystagmus at any point of gaze. Ears: Auricles well formed without lesions.  Bilateral cerumen impaction.  Nose: External evaluation reveals normal support and skin without lesions.  Dorsum is intact.  Anterior rhinoscopy reveals congested mucosa over anterior aspect of inferior turbinates and intact septum.  Oral:  Oral cavity and oropharynx are intact, symmetric, without erythema or edema.  Mucosa is moist without lesions. Neck: Full range of motion without pain.  There is no significant lymphadenopathy.  No masses palpable.  Thyroid bed within normal limits to palpation.  Parotid glands and submandibular glands equal bilaterally without mass.  Trachea is midline. Neuro:  CN 2-12 grossly intact.    Procedure: Bilateral cerumen disimpaction Anesthesia: None Description: Under the operating microscope, the cerumen is carefully removed with a combination of cerumen currette, alligator forceps, and suction catheters.  After the cerumen is removed, the TMs are noted to be normal.  No  mass, erythema, or lesions. The patient tolerated the procedure well.    Assessment: 1.  Bilateral cerumen impaction.  After the disimpaction procedure, both tympanic membranes and middle ear spaces are noted to be normal. 2.  Chronic rhinitis with nasal mucosal congestion, bilateral inferior turbinate hypertrophy, and chronic postnasal drainage.  No acute infection is noted today.  Plan: 1.  Otomicroscopy with bilateral cerumen disimpaction. 2.  The physical exam findings are reviewed with the patient.  The patient is reassured that no acute infection is noted today. 3.  Flonase nasal spray 2 sprays each nostril daily. 4.  Nasal saline irrigation is encouraged. 5.  The patient will return for reevaluation if her symptoms worsen.

## 2023-07-27 ENCOUNTER — Encounter: Payer: Self-pay | Admitting: Hematology and Oncology

## 2023-09-10 ENCOUNTER — Ambulatory Visit: Payer: PPO | Admitting: Internal Medicine

## 2023-09-10 NOTE — Progress Notes (Unsigned)
Name: Jessica Reilly  MRN/ DOB: [id number], 1961/05/05   Age/ Sex: 62 y.o., female    PCP: Assunta Found, MD   Reason for Endocrinology Evaluation: Type 2 Diabetes Mellitus     Date of Initial Endocrinology Visit: 01/04/2022    PATIENT IDENTIFIER: Jessica Reilly is a 62 y.o. female with a past medical history of HTN, fatty liver, T2DM, factor V Leiden mutation , history of breast CA and bipolar disorder. The patient presented for initial endocrinology clinic visit on 01/04/2022 for consultative assistance with her diabetes management.    HPI: Jessica Reilly was    Diagnosed with DM in 2022 Hemoglobin A1c  peaking at 7.8% in 2022. On her initial visit to our clinic she had an A1c of 8.9%, she was not on any medications, we started her on metformin   SUBJECTIVE:   During the last visit (03/07/2023): A1c 6.2 %     Today (09/10/23): Ms. Jessica Reilly is here for follow-up on diabetes management.  She checks her blood sugars 1 times daily. The patient has not had hypoglycemic episodes since the last clinic visit  She continues to follow with oncology for hx of breast ca. And Hx recurrent DVT. Last visit 06/2023  Pt follows with dermatology for psoriasis vulgaris   She has occasional chronic constipation Denies nausea or vomiting    HOME DIABETES REGIMEN: Metformin 500 mg XR twice daily   Statin: Yes ACE-I/ARB: No Prior Diabetic Education: no    METER DOWNLOAD SUMMARY:    DIABETIC COMPLICATIONS: Microvascular complications:   Denies: retinopathy, neuropathy , CKD  Last eye exam: scheduled 03/2023  Macrovascular complications:   Denies: CAD, PVD, CVA   PAST HISTORY: Past Medical History:  Past Medical History:  Diagnosis Date   Abnormal Pap smear    Acid reflux    Bipolar disorder (HCC) 03/22/2013   Breast cancer (HCC)    Breast disorder    cancer   Chronic anticoagulation 03/22/2013   Coagulopathy (HCC) 04/10/2012   Constipation    DVT of upper extremity  (deep vein thrombosis) (HCC) 04/10/2012   Factor V Leiden (HCC)    Factor V Leiden mutation (HCC) 04/10/2012   Family history of ovarian cancer    Fatty liver 06/04/2017   Fibroid    Gallstones    Heart murmur    High cholesterol    History of abnormal cervical Pap smear 01/06/2015   History of breast cancer 01/06/2015   HTN (hypertension)    HX: breast cancer    Hypertension 12/31/2012   Lobular carcinoma of breast, estrogen receptor positive, 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   Mental disorder    panic attacks   Osteoarthritis    Pain in both knees    Psoriasis    Vaginal Pap smear, abnormal    Past Surgical History:  Past Surgical History:  Procedure Laterality Date   BIOPSY  11/23/2021   Procedure: BIOPSY;  Surgeon: Malissa Hippo, MD;  [facility];  Service: Endoscopy;;   BREAST LUMPECTOMY     COLONOSCOPY WITH ESOPHAGOGASTRODUODENOSCOPY (EGD) N/A 07/23/2013   Procedure: COLONOSCOPY WITH ESOPHAGOGASTRODUODENOSCOPY (EGD);  Surgeon: Malissa Hippo, MD;  Location: [facility];  Service: Endoscopy;  Laterality: N/A;  125   COLONOSCOPY WITH PROPOFOL N/A 11/23/2021   Procedure: COLONOSCOPY WITH PROPOFOL;  Surgeon: Malissa Hippo, MD;  [facility]: [facility];  Service: Endoscopy;  Laterality: N/A;  855   ESOPHAGOGASTRODUODENOSCOPY (EGD) WITH PROPOFOL N/A 11/23/2021   Procedure: ESOPHAGOGASTRODUODENOSCOPY (EGD) WITH  PROPOFOL;  Surgeon: Malissa Hippo, MD;  [facility]: [facility];  Service: Endoscopy;  Laterality: N/A;   LYMPH NODE BIOPSY     MASTECTOMY  bilateral   ovaries removed     POLYPECTOMY  11/23/2021   Procedure: POLYPECTOMY;  Surgeon: Malissa Hippo, MD;  [facility]: [facility];  Service: Endoscopy;;   TONSILLECTOMY AND ADENOIDECTOMY      Social History:  reports that she has never smoked. She has never used smokeless tobacco. She reports that she does not currently use alcohol. She reports that she does not use drugs. Family History:  Family History  Problem  Relation Age of Onset   Hypertension Mother    Alcohol abuse Mother    COPD Mother    Hypertension Father    Alcohol abuse Father    Heart disease Father    Pneumonia Father    Diabetes Father    Colon cancer Father        Dx 34s; deceased 99s   Healthy Sister    Cancer Brother        leukemia 25s; deceased 26   Heart disease Brother    Cancer Brother        liver; currently 62   Cirrhosis Brother    Other Brother        waiting for liver transplant   Ovarian cancer Maternal Grandmother 15       deceased 30   Alzheimer's disease Paternal Grandmother    OCD Daughter    Bipolar disorder Daughter    Hypertension Daughter    Schizophrenia Daughter    Schizophrenia Daughter    Bipolar disorder Daughter    OCD Daughter    Hypertension Daughter    COPD Daughter    Other Daughter        stomach don't empty out   Cancer Maternal Uncle        3 of 6 with various cancers: skin, NHL, Hodkin's Dx   ADD / ADHD Grandchild    Anxiety disorder Grandchild    Depression Grandchild    Breast cancer Cousin 2       mat first cousin related through unaffected aunt   Stomach cancer Neg Hx    Rectal cancer Neg Hx    Esophageal cancer Neg Hx      HOME MEDICATIONS: Allergies as of 09/10/2023       Reactions   Amoxicillin-pot Clavulanate Rash   Ceftin Rash   Cefuroxime Rash   Cefuroxime Axetil Rash   Risperidone And Related Other (See Comments)   Has medical contraindications        Medication List        Accurate as of September 10, 2023  6:46 AM. If you have any questions, ask your nurse or doctor.          acetaminophen 500 MG tablet Commonly known as: TYLENOL Take 1,000 mg by mouth every 6 (six) hours as needed for moderate pain.   alprazolam 2 MG tablet Commonly known as: XANAX Take 1-2 mg by mouth 2 (two) times daily as needed for anxiety.   anastrozole 1 MG tablet Commonly known as: ARIMIDEX Take 1 tablet (1 mg total) by mouth at bedtime.   atorvastatin 40  MG tablet Commonly known as: LIPITOR TAKE 1 TABLET BY MOUTH ONCE A DAY.   Calcium 600+D3 Plus Minerals 600-800 MG-UNIT Tabs Take 2 tablets by mouth daily.   docusate sodium 100 MG capsule Commonly known as: Stool Softener Take  2 capsules (200 mg total) by mouth at bedtime.   esomeprazole 40 MG capsule Commonly known as: NEXIUM Take 20 mg by mouth 2 (two) times daily before a meal.   fluticasone 50 MCG/ACT nasal spray Commonly known as: FLONASE Place 2 sprays into both nostrils at bedtime.   levocetirizine 5 MG tablet Commonly known as: XYZAL TAKE ONE TABLET BY MOUTH AT BEDTIME.   Magnesium 400 MG Tabs Take 400 mg by mouth daily.   metFORMIN 500 MG 24 hr tablet Commonly known as: GLUCOPHAGE-XR Take 2 tablets (1,000 mg total) by mouth daily in the afternoon. Take 1 tablet for 1 week and then increase to 2 tablets   metoprolol succinate 100 MG 24 hr tablet Commonly known as: TOPROL-XL Take 100 mg by mouth daily.   omega-3 acid ethyl esters 1 g capsule Commonly known as: LOVAZA Take 1 capsule by mouth 2 (two) times daily.   OneTouch Delica Lancets 30G Misc Check 1-2 times daily   OneTouch Verio test strip Generic drug: glucose blood 1 each by Other route daily in the afternoon. Use as instructed   promethazine 25 MG tablet Commonly known as: PHENERGAN Take 1 tablet (25 mg total) by mouth every 6 (six) hours as needed for nausea.   simvastatin 40 MG tablet Commonly known as: ZOCOR Take 20 mg by mouth 2 (two) times daily.   Vtama 1 % Crea Generic drug: Tapinarof Apply topically daily.   warfarin 7.5 MG tablet Commonly known as: COUMADIN Take as directed by the anticoagulation clinic. If you are unsure how to take this medication, talk to your nurse or doctor. Original instructions: Take 1 tablet (7.5 mg total) by mouth daily.   warfarin 10 MG tablet Commonly known as: COUMADIN Take as directed by the anticoagulation clinic. If you are unsure how to take this  medication, talk to your nurse or doctor. Original instructions: TAKE ONE TABLET BY MOUTH 4 TIMES WEEKLY. AS DIRECTED.         ALLERGIES: Allergies  Allergen Reactions   Amoxicillin-Pot Clavulanate Rash   Ceftin Rash   Cefuroxime Rash   Cefuroxime Axetil Rash   Risperidone And Related Other (See Comments)    Has medical contraindications        OBJECTIVE:   VITAL SIGNS: There were no vitals taken for this visit.   PHYSICAL EXAM:  General: Pt appears well and is in NAD  Lungs: Clear with good BS bilat  Heart: RRR   Extremities:  Lower extremities - No pretibial edema.  Neuro: MS is good with appropriate affect, pt is alert and Ox3   Dm Foot Exam 03/07/2023  The skin of the feet is intact without sores or ulcerations. The pedal pulses are 2+ on right and 2+ on left. The sensation is intact to a screening 5.07, 10 gram monofilament bilaterally   DATA REVIEWED:  Lab Results  Component Value Date   HGBA1C 6.2 (A) 03/07/2023   HGBA1C 5.5 09/05/2022   HGBA1C 5.6 04/19/2022    Latest Reference Range & Units 03/07/23 08:49  Sodium 135 - 145 mEq/L 136  Potassium 3.5 - 5.1 mEq/L 4.1  Chloride 96 - 112 mEq/L 99  CO2 19 - 32 mEq/L 29  Glucose 70 - 99 mg/dL 161 (H)  BUN 6 - 23 mg/dL 11  Creatinine 0.96 - 0.45 mg/dL 4.09  Calcium 8.4 - 81.1 mg/dL 91.4  GFR >78.29 mL/min 61.88  (H): Data is abnormally high   ASSESSMENT / PLAN / RECOMMENDATIONS:  1) Type 2 Diabetes Mellitus, Without complications - Most recent A1c of 6.2 %. Goal A1c < 7.0 %.    -A1c optimal at 6.2% -A1c has increased from 5.5% to 6.2%, patient does admit to dietary indiscretions -We discussed switching metformin to a different diabetic agent with renal protection given low GFR in the past -I have offered SGLT2 inhibitors, patient is very skeptical, so commercial online, her about side effects to include cancer, heart disease etc..  Her daughter is getting ready to start what sounds like a GLP-1  agonist, she is interested in knowing more about it, I did explain to the patient given CKD, the preference is to go first with SGLT2 inhibitors, but if she opts to go with GLP-1 agonist that is still better than what she is on right now -BMP shows normalization of GFR  MEDICATIONS: Continue  metformin 500 mg XR, 2 tablets  daily    EDUCATION / INSTRUCTIONS: BG monitoring instructions: Patient is instructed to check her blood sugars 1 times a day, fasting .   2) Diabetic complications:  Eye: Does not have known diabetic retinopathy.  Neuro/ Feet: Does not have known diabetic peripheral neuropathy. Renal: Patient does not have known baseline CKD. She is  on an ACEI/ARB at present.    F/U in 6 months     Signed electronically by: Lyndle Herrlich, MD  [facility]  [facility] [address] [place] [place], [place] [address] Phone: [phone] FAX: [phone]   CC: Assunta Found, MD [address] [place] [address] Phone: [phone]  Fax: [phone]    Return to Endocrinology clinic as below: Future Appointments  Date Time Provider Department Center  09/10/2023  8:10 AM Cristal Qadir, Konrad Dolores, MD [id number] None

## 2023-09-13 DIAGNOSIS — Z7901 Long term (current) use of anticoagulants: Secondary | ICD-10-CM | POA: Diagnosis not present

## 2023-09-13 DIAGNOSIS — D6851 Activated protein C resistance: Secondary | ICD-10-CM | POA: Diagnosis not present

## 2023-09-16 ENCOUNTER — Other Ambulatory Visit: Payer: Self-pay | Admitting: Hematology and Oncology

## 2023-09-17 ENCOUNTER — Encounter: Payer: Self-pay | Admitting: Hematology and Oncology

## 2023-10-17 ENCOUNTER — Other Ambulatory Visit: Payer: Self-pay | Admitting: Physician Assistant

## 2023-10-17 DIAGNOSIS — M545 Low back pain, unspecified: Secondary | ICD-10-CM

## 2023-10-17 DIAGNOSIS — M4800 Spinal stenosis, site unspecified: Secondary | ICD-10-CM

## 2023-10-17 DIAGNOSIS — M5126 Other intervertebral disc displacement, lumbar region: Secondary | ICD-10-CM

## 2023-10-25 DIAGNOSIS — M25562 Pain in left knee: Secondary | ICD-10-CM | POA: Diagnosis not present

## 2023-10-25 DIAGNOSIS — M25561 Pain in right knee: Secondary | ICD-10-CM | POA: Diagnosis not present

## 2023-10-29 ENCOUNTER — Encounter: Payer: Self-pay | Admitting: Hematology and Oncology

## 2023-10-30 NOTE — Discharge Instructions (Signed)

## 2023-10-31 ENCOUNTER — Other Ambulatory Visit: Payer: PPO

## 2023-10-31 ENCOUNTER — Inpatient Hospital Stay
Admission: RE | Admit: 2023-10-31 | Discharge: 2023-10-31 | Disposition: A | Discharge status: Home / Self Care | Payer: PPO | Source: Ambulatory Visit | Attending: Physician Assistant | Admitting: Physician Assistant

## 2023-11-06 NOTE — Discharge Instructions (Signed)
Myelogram Discharge Instructions  Go home and rest quietly as needed. You may resume normal activities; however, do not exert yourself strongly or do any heavy lifting today and tomorrow.   DO NOT drive today.    You may resume your normal diet and medications unless otherwise indicated. Drink lots of extra fluids today and tomorrow.   The incidence of headache, nausea, or vomiting is about 5% (one in 20 patients).  If you develop a headache, lie flat for 24 hours and drink plenty of fluids until the headache goes away.  Caffeinated beverages may be helpful. If when you get up you still have a headache when standing, go back to bed and force fluids for another 24 hours.   If you develop severe nausea and vomiting or a headache that does not go away with the flat bedrest after 48 hours, please call [phone].   Call your physician for a follow-up appointment.  The results of your myelogram will be sent directly to your physician by the following day.  If you have any questions or if complications develop after you arrive home, please call [phone].  Discharge instructions have been explained to the patient.  The patient, or the person responsible for the patient, fully understands these instructions.   Thank you for visiting our office today.

## 2023-11-07 ENCOUNTER — Ambulatory Visit
Admission: RE | Admit: 2023-11-07 | Discharge: 2023-11-07 | Disposition: A | Discharge status: Home / Self Care | Payer: PPO | Source: Ambulatory Visit | Attending: Physician Assistant | Admitting: Physician Assistant

## 2023-11-07 DIAGNOSIS — M51362 Other intervertebral disc degeneration, lumbar region with discogenic back pain and lower extremity pain: Secondary | ICD-10-CM | POA: Diagnosis not present

## 2023-11-07 DIAGNOSIS — M4800 Spinal stenosis, site unspecified: Secondary | ICD-10-CM

## 2023-11-07 DIAGNOSIS — M47817 Spondylosis without myelopathy or radiculopathy, lumbosacral region: Secondary | ICD-10-CM | POA: Diagnosis not present

## 2023-11-07 DIAGNOSIS — M48061 Spinal stenosis, lumbar region without neurogenic claudication: Secondary | ICD-10-CM | POA: Diagnosis not present

## 2023-11-07 DIAGNOSIS — M5126 Other intervertebral disc displacement, lumbar region: Secondary | ICD-10-CM

## 2023-11-07 DIAGNOSIS — M4316 Spondylolisthesis, lumbar region: Secondary | ICD-10-CM | POA: Diagnosis not present

## 2023-11-07 DIAGNOSIS — M545 Low back pain, unspecified: Secondary | ICD-10-CM

## 2023-11-07 DIAGNOSIS — M47816 Spondylosis without myelopathy or radiculopathy, lumbar region: Secondary | ICD-10-CM | POA: Diagnosis not present

## 2023-11-07 MED ORDER — DIAZEPAM 5 MG PO TABS
10.0000 mg | ORAL_TABLET | Freq: Once | ORAL | Status: AC
  Administered 2023-11-07: 5 mg via ORAL

## 2023-11-07 MED ORDER — ONDANSETRON HCL 4 MG/2ML IJ SOLN: 4.0000 mg | Freq: Once | INTRAMUSCULAR | Status: DC | PRN

## 2023-11-07 MED ORDER — MEPERIDINE HCL 50 MG/ML IJ SOLN: 50.0000 mg | Freq: Once | INTRAMUSCULAR | Status: DC | PRN

## 2023-11-07 MED ORDER — IOPAMIDOL (ISOVUE-M 200) INJECTION 41%
20.0000 mL | Freq: Once | INTRAMUSCULAR | Status: AC
  Administered 2023-11-07: 20 mL via INTRATHECAL

## 2023-11-12 DIAGNOSIS — M5416 Radiculopathy, lumbar region: Secondary | ICD-10-CM | POA: Diagnosis not present

## 2023-11-16 DIAGNOSIS — J069 Acute upper respiratory infection, unspecified: Secondary | ICD-10-CM | POA: Diagnosis not present

## 2023-11-16 DIAGNOSIS — I10 Essential (primary) hypertension: Secondary | ICD-10-CM | POA: Diagnosis not present

## 2023-11-16 DIAGNOSIS — R6889 Other general symptoms and signs: Secondary | ICD-10-CM | POA: Diagnosis not present

## 2023-11-16 DIAGNOSIS — E6609 Other obesity due to excess calories: Secondary | ICD-10-CM | POA: Diagnosis not present

## 2023-11-16 DIAGNOSIS — F419 Anxiety disorder, unspecified: Secondary | ICD-10-CM | POA: Diagnosis not present

## 2023-11-16 DIAGNOSIS — Z20828 Contact with and (suspected) exposure to other viral communicable diseases: Secondary | ICD-10-CM | POA: Diagnosis not present

## 2023-11-16 DIAGNOSIS — Z6836 Body mass index (BMI) 36.0-36.9, adult: Secondary | ICD-10-CM | POA: Diagnosis not present

## 2023-11-30 ENCOUNTER — Other Ambulatory Visit: Payer: Self-pay | Admitting: Hematology and Oncology

## 2023-12-03 ENCOUNTER — Encounter: Payer: Self-pay | Admitting: Hematology and Oncology

## 2023-12-03 ENCOUNTER — Inpatient Hospital Stay: Payer: PPO | Attending: Internal Medicine

## 2023-12-03 DIAGNOSIS — Z170 Estrogen receptor positive status [ER+]: Secondary | ICD-10-CM | POA: Diagnosis not present

## 2023-12-03 DIAGNOSIS — D509 Iron deficiency anemia, unspecified: Secondary | ICD-10-CM | POA: Insufficient documentation

## 2023-12-03 DIAGNOSIS — Z79811 Long term (current) use of aromatase inhibitors: Secondary | ICD-10-CM | POA: Diagnosis not present

## 2023-12-03 DIAGNOSIS — C50411 Malignant neoplasm of upper-outer quadrant of right female breast: Secondary | ICD-10-CM | POA: Diagnosis not present

## 2023-12-03 DIAGNOSIS — M8580 Other specified disorders of bone density and structure, unspecified site: Secondary | ICD-10-CM | POA: Insufficient documentation

## 2023-12-03 LAB — CBC WITH DIFFERENTIAL (CANCER CENTER ONLY)
Abs Immature Granulocytes: 0.02 10*3/uL (ref 0.00–0.07)
Basophils Absolute: 0.1 10*3/uL (ref 0.0–0.1)
Basophils Relative: 1 %
Eosinophils Absolute: 0.1 10*3/uL (ref 0.0–0.5)
Eosinophils Relative: 1 %
HCT: 42.3 % (ref 36.0–46.0)
Hemoglobin: 14.1 g/dL (ref 12.0–15.0)
Immature Granulocytes: 0 %
Lymphocytes Relative: 48 %
Lymphs Abs: 3.3 10*3/uL (ref 0.7–4.0)
MCH: 30.9 pg (ref 26.0–34.0)
MCHC: 33.3 g/dL (ref 30.0–36.0)
MCV: 92.8 fL (ref 80.0–100.0)
Monocytes Absolute: 0.3 10*3/uL (ref 0.1–1.0)
Monocytes Relative: 5 %
Neutro Abs: 3.1 10*3/uL (ref 1.7–7.7)
Neutrophils Relative %: 45 %
Platelet Count: 266 10*3/uL (ref 150–400)
RBC: 4.56 MIL/uL (ref 3.87–5.11)
RDW: 14.5 % (ref 11.5–15.5)
WBC Count: 6.9 10*3/uL (ref 4.0–10.5)
nRBC: 0 % (ref 0.0–0.2)

## 2023-12-03 LAB — IRON AND IRON BINDING CAPACITY (CC-WL,HP ONLY)
Iron: 111 ug/dL (ref 28–170)
Saturation Ratios: 26 % (ref 10.4–31.8)
TIBC: 421 ug/dL (ref 250–450)
UIBC: 310 ug/dL (ref 148–442)

## 2023-12-03 LAB — FERRITIN: Ferritin: 19 ng/mL (ref 11–307)

## 2023-12-05 ENCOUNTER — Inpatient Hospital Stay (HOSPITAL_BASED_OUTPATIENT_CLINIC_OR_DEPARTMENT_OTHER): Payer: PPO | Admitting: Hematology and Oncology

## 2023-12-05 DIAGNOSIS — Z170 Estrogen receptor positive status [ER+]: Secondary | ICD-10-CM

## 2023-12-05 DIAGNOSIS — D509 Iron deficiency anemia, unspecified: Secondary | ICD-10-CM | POA: Diagnosis not present

## 2023-12-05 DIAGNOSIS — C50411 Malignant neoplasm of upper-outer quadrant of right female breast: Secondary | ICD-10-CM | POA: Diagnosis not present

## 2023-12-05 DIAGNOSIS — M5416 Radiculopathy, lumbar region: Secondary | ICD-10-CM | POA: Diagnosis not present

## 2023-12-05 NOTE — Progress Notes (Unsigned)
 HEMATOLOGY-ONCOLOGY TELEPHONE VISIT PROGRESS NOTE  I connected with our patient on 12/06/23 at  1:30 PM EST by telephone and verified that I am speaking with the correct person using two identifiers.  I discussed the limitations, risks, security and privacy concerns of performing an evaluation and management service by telephone and the availability of in person appointments.  I also discussed with the patient that there may be a patient responsible charge related to this service. The patient expressed understanding and agreed to proceed.   History of Present Illness: Breast cancer and iron deficiency anemia Ms. Jessica Reilly is a 63 year old with history of iron deficiency anemia.  She had recent blood work and connected by phone to discuss results.  Overall she feels fairly well.  She is continuing antiestrogen therapy with anastrozole.  She reports no side effects or concerns.    Oncology History  Malignant neoplasm of upper-outer quadrant of right breast in female, estrogen receptor positive (HCC)  2008 Initial Diagnosis   T2N0, ER PR positive, HER-2 negative, invasive ductal carcinoma of the right breast.    Status post bilateral mastectomies, radiation and antiestrogen therapy since 2009 treatment was done at [facility].   2009 -  Anti-estrogen oral therapy   Anastrozole 1 mg daily.  Plan is to treat her indefinitely.  Patient understands risks and benefits.  There is no data for long-term antiestrogen therapy but she is very anxious to come off of it.     REVIEW OF SYSTEMS:   Constitutional: Denies fevers, chills or abnormal weight loss All other systems were reviewed with the patient and are negative. 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  Status post bilateral mastectomies at [facility], radiation, and chemotherapy CEF x5 cycles followed by radiation to the right chest   Current treatment: Anastrozole started 2009 Anastrozole toxicities: Denies any hot flashes or myalgias.   Breast cancer surveillance: 1.  01/23/2022: Breast examination: Bilateral mastectomies, no palpable lumps or nodules of concern 2.  No role of imaging studies since she had bilateral mastectomies Bone density done at [facility] February 2022: T score -1.3: Osteopenia  Patient wishes to stay on anastrozole indefinitely.    Iron deficiency anemia IV iron given in May 2023   Lab review:  01/26/2022: Hemoglobin 10.4, MCV 81 05/31/2022: Ferritin 4, iron saturation 4%, B12 4091, hemoglobin 10.1, MCV 89 (recommended IV iron but patient wanted to watch and monitor) 08/21/2022: Hemoglobin 11.2, MCV 79, RDW 15.7, TIBC 404, iron saturation 8%, ferritin 7 10/30/2022: Hemoglobin 11.2, MCV 75.6, RDW 19.2 06/25/2023: Hemoglobin 12.5, MCV 88.4, ferritin 8, iron saturation 13% 12/03/23: Hemoglobin 14.1, MCV 92.8, Sat: 26%, Ferritin: 19   Recheck labs in 6 months and telephone visit after that to discuss results.      I discussed the assessment and treatment plan with the patient. The patient was provided an opportunity to ask questions and all were answered. The patient agreed with the plan and demonstrated an understanding of the instructions. The patient was advised to call back or seek an in-person evaluation if the symptoms worsen or if the condition fails to improve as anticipated.   I provided 20 minutes of non-face-to-face time during this encounter.  This includes time for charting and coordination of care   Tamsen Meek, MD

## 2023-12-05 NOTE — Assessment & Plan Note (Signed)
 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  Status post bilateral mastectomies at [facility], radiation, and chemotherapy CEF x5 cycles followed by radiation to the right chest   Current treatment: Anastrozole started 2009 Anastrozole toxicities: Denies any hot flashes or myalgias.   Breast cancer surveillance: 1.  01/23/2022: Breast examination: Bilateral mastectomies, no palpable lumps or nodules of concern 2.  No role of imaging studies since she had bilateral mastectomies Bone density done at [facility] February 2022: T score -1.3: Osteopenia  Patient wishes to stay on anastrozole indefinitely.     Iron deficiency anemia IV iron given in May 2023   Lab review:  01/26/2022: Hemoglobin 10.4, MCV 81 05/31/2022: Ferritin 4, iron saturation 4%, B12 4091, hemoglobin 10.1, MCV 89 (recommended IV iron but patient wanted to watch and monitor) 08/21/2022: Hemoglobin 11.2, MCV 79, RDW 15.7, TIBC 404, iron saturation 8%, ferritin 7 10/30/2022: Hemoglobin 11.2, MCV 75.6, RDW 19.2 06/25/2023: Hemoglobin 12.5, MCV 88.4, ferritin 8, iron saturation 13% 12/03/23: Hemoglobin 14.1, MCV 92.8, Sat: 26%, Ferritin: 19   continue to take orally iron Recheck labs in 6 months and telephone visit after that to discuss results.

## 2023-12-06 ENCOUNTER — Telehealth: Payer: Self-pay | Admitting: Hematology and Oncology

## 2023-12-06 ENCOUNTER — Encounter: Payer: Self-pay | Admitting: Hematology and Oncology

## 2023-12-06 NOTE — Telephone Encounter (Signed)
 Scheduled appointments per 2/26 los. Patient is aware of the made appointments.

## 2023-12-12 DIAGNOSIS — E6609 Other obesity due to excess calories: Secondary | ICD-10-CM | POA: Diagnosis not present

## 2023-12-12 DIAGNOSIS — Z6836 Body mass index (BMI) 36.0-36.9, adult: Secondary | ICD-10-CM | POA: Diagnosis not present

## 2023-12-12 DIAGNOSIS — Z20828 Contact with and (suspected) exposure to other viral communicable diseases: Secondary | ICD-10-CM | POA: Diagnosis not present

## 2023-12-12 DIAGNOSIS — J101 Influenza due to other identified influenza virus with other respiratory manifestations: Secondary | ICD-10-CM | POA: Diagnosis not present

## 2023-12-24 DIAGNOSIS — M48061 Spinal stenosis, lumbar region without neurogenic claudication: Secondary | ICD-10-CM | POA: Diagnosis not present

## 2024-01-04 DIAGNOSIS — M545 Low back pain, unspecified: Secondary | ICD-10-CM | POA: Diagnosis not present

## 2024-01-04 DIAGNOSIS — M5416 Radiculopathy, lumbar region: Secondary | ICD-10-CM | POA: Diagnosis not present

## 2024-01-05 DIAGNOSIS — M545 Low back pain, unspecified: Secondary | ICD-10-CM | POA: Diagnosis not present

## 2024-01-05 DIAGNOSIS — M5416 Radiculopathy, lumbar region: Secondary | ICD-10-CM | POA: Diagnosis not present

## 2024-01-06 DIAGNOSIS — M5416 Radiculopathy, lumbar region: Secondary | ICD-10-CM | POA: Diagnosis not present

## 2024-01-06 DIAGNOSIS — M545 Low back pain, unspecified: Secondary | ICD-10-CM | POA: Diagnosis not present

## 2024-01-07 DIAGNOSIS — M5416 Radiculopathy, lumbar region: Secondary | ICD-10-CM | POA: Diagnosis not present

## 2024-01-07 DIAGNOSIS — M545 Low back pain, unspecified: Secondary | ICD-10-CM | POA: Diagnosis not present

## 2024-01-08 DIAGNOSIS — M5416 Radiculopathy, lumbar region: Secondary | ICD-10-CM | POA: Diagnosis not present

## 2024-01-08 DIAGNOSIS — M545 Low back pain, unspecified: Secondary | ICD-10-CM | POA: Diagnosis not present

## 2024-01-16 ENCOUNTER — Ambulatory Visit: Admitting: Internal Medicine

## 2024-01-22 DIAGNOSIS — M25561 Pain in right knee: Secondary | ICD-10-CM | POA: Diagnosis not present

## 2024-01-22 DIAGNOSIS — M25562 Pain in left knee: Secondary | ICD-10-CM | POA: Diagnosis not present

## 2024-02-04 DIAGNOSIS — M545 Low back pain, unspecified: Secondary | ICD-10-CM | POA: Diagnosis not present

## 2024-02-04 DIAGNOSIS — M48061 Spinal stenosis, lumbar region without neurogenic claudication: Secondary | ICD-10-CM | POA: Diagnosis not present

## 2024-02-04 DIAGNOSIS — M5416 Radiculopathy, lumbar region: Secondary | ICD-10-CM | POA: Diagnosis not present

## 2024-02-05 ENCOUNTER — Encounter: Payer: Self-pay | Admitting: Hematology and Oncology

## 2024-02-05 DIAGNOSIS — Z170 Estrogen receptor positive status [ER+]: Secondary | ICD-10-CM | POA: Diagnosis not present

## 2024-02-05 DIAGNOSIS — C50419 Malignant neoplasm of upper-outer quadrant of unspecified female breast: Secondary | ICD-10-CM | POA: Diagnosis not present

## 2024-02-07 DIAGNOSIS — M5416 Radiculopathy, lumbar region: Secondary | ICD-10-CM | POA: Diagnosis not present

## 2024-02-07 DIAGNOSIS — M545 Low back pain, unspecified: Secondary | ICD-10-CM | POA: Diagnosis not present

## 2024-02-07 DIAGNOSIS — M48061 Spinal stenosis, lumbar region without neurogenic claudication: Secondary | ICD-10-CM | POA: Diagnosis not present

## 2024-02-26 ENCOUNTER — Other Ambulatory Visit: Payer: Self-pay | Admitting: Hematology and Oncology

## 2024-03-05 DIAGNOSIS — M5416 Radiculopathy, lumbar region: Secondary | ICD-10-CM | POA: Diagnosis not present

## 2024-03-05 DIAGNOSIS — M48061 Spinal stenosis, lumbar region without neurogenic claudication: Secondary | ICD-10-CM | POA: Diagnosis not present

## 2024-03-05 DIAGNOSIS — M545 Low back pain, unspecified: Secondary | ICD-10-CM | POA: Diagnosis not present

## 2024-03-09 DIAGNOSIS — M545 Low back pain, unspecified: Secondary | ICD-10-CM | POA: Diagnosis not present

## 2024-03-09 DIAGNOSIS — M5416 Radiculopathy, lumbar region: Secondary | ICD-10-CM | POA: Diagnosis not present

## 2024-03-09 DIAGNOSIS — M48061 Spinal stenosis, lumbar region without neurogenic claudication: Secondary | ICD-10-CM | POA: Diagnosis not present

## 2024-03-13 ENCOUNTER — Other Ambulatory Visit: Payer: Self-pay | Admitting: Hematology and Oncology

## 2024-03-13 ENCOUNTER — Other Ambulatory Visit: Payer: Self-pay | Admitting: *Deleted

## 2024-03-13 DIAGNOSIS — D6851 Activated protein C resistance: Secondary | ICD-10-CM | POA: Diagnosis not present

## 2024-03-13 DIAGNOSIS — I82623 Acute embolism and thrombosis of deep veins of upper extremity, bilateral: Secondary | ICD-10-CM

## 2024-03-13 DIAGNOSIS — I82622 Acute embolism and thrombosis of deep veins of left upper extremity: Secondary | ICD-10-CM

## 2024-03-13 DIAGNOSIS — I82629 Acute embolism and thrombosis of deep veins of unspecified upper extremity: Secondary | ICD-10-CM

## 2024-03-13 DIAGNOSIS — Z7901 Long term (current) use of anticoagulants: Secondary | ICD-10-CM | POA: Diagnosis not present

## 2024-03-13 MED ORDER — WARFARIN SODIUM 10 MG PO TABS: 10.0000 mg | ORAL_TABLET | Freq: Every day | ORAL | 0 refills | Status: DC

## 2024-03-13 MED ORDER — WARFARIN SODIUM 7.5 MG PO TABS: 7.5000 mg | ORAL_TABLET | Freq: Every day | ORAL | 3 refills | Status: DC

## 2024-03-13 NOTE — Progress Notes (Signed)
 Received call from pt stating her INR today at home was 1.4.  Pt states she is currently taking Warfarin 10 mg M,W,F and Warfarin 7.5 mg T,Th, Sat and Sun.  Pt states she started taking Vitamin D with K.  Per MD pt needing to stop taking Vitamin K and recheck INR in 1 week. If INR does not increase, MD states pt will need to increase Warfarin to 10 mg daily x 7 days. Pt educated and verbalized understanding stating she will contact our office next week with INR result.

## 2024-03-14 ENCOUNTER — Encounter: Payer: Self-pay | Admitting: Hematology and Oncology

## 2024-03-24 DIAGNOSIS — E119 Type 2 diabetes mellitus without complications: Secondary | ICD-10-CM | POA: Diagnosis not present

## 2024-03-24 DIAGNOSIS — H25012 Cortical age-related cataract, left eye: Secondary | ICD-10-CM | POA: Diagnosis not present

## 2024-03-24 DIAGNOSIS — H26491 Other secondary cataract, right eye: Secondary | ICD-10-CM | POA: Diagnosis not present

## 2024-03-24 LAB — HM DIABETES EYE EXAM

## 2024-04-05 DIAGNOSIS — M545 Low back pain, unspecified: Secondary | ICD-10-CM | POA: Diagnosis not present

## 2024-04-05 DIAGNOSIS — M48061 Spinal stenosis, lumbar region without neurogenic claudication: Secondary | ICD-10-CM | POA: Diagnosis not present

## 2024-04-05 DIAGNOSIS — M5416 Radiculopathy, lumbar region: Secondary | ICD-10-CM | POA: Diagnosis not present

## 2024-04-08 DIAGNOSIS — M5416 Radiculopathy, lumbar region: Secondary | ICD-10-CM | POA: Diagnosis not present

## 2024-04-08 DIAGNOSIS — M48061 Spinal stenosis, lumbar region without neurogenic claudication: Secondary | ICD-10-CM | POA: Diagnosis not present

## 2024-04-08 DIAGNOSIS — M545 Low back pain, unspecified: Secondary | ICD-10-CM | POA: Diagnosis not present

## 2024-04-09 ENCOUNTER — Encounter: Payer: Self-pay | Admitting: Hematology and Oncology

## 2024-05-05 DIAGNOSIS — M48061 Spinal stenosis, lumbar region without neurogenic claudication: Secondary | ICD-10-CM | POA: Diagnosis not present

## 2024-05-05 DIAGNOSIS — M5416 Radiculopathy, lumbar region: Secondary | ICD-10-CM | POA: Diagnosis not present

## 2024-05-05 DIAGNOSIS — M545 Low back pain, unspecified: Secondary | ICD-10-CM | POA: Diagnosis not present

## 2024-05-09 DIAGNOSIS — M5416 Radiculopathy, lumbar region: Secondary | ICD-10-CM | POA: Diagnosis not present

## 2024-05-09 DIAGNOSIS — M545 Low back pain, unspecified: Secondary | ICD-10-CM | POA: Diagnosis not present

## 2024-05-09 DIAGNOSIS — M48061 Spinal stenosis, lumbar region without neurogenic claudication: Secondary | ICD-10-CM | POA: Diagnosis not present

## 2024-05-16 ENCOUNTER — Telehealth: Payer: Self-pay | Admitting: Hematology and Oncology

## 2024-05-16 NOTE — Telephone Encounter (Signed)
 Rescheduled appointment for two weeks sooner per the patients request The patient is aware of the changes made.

## 2024-05-19 ENCOUNTER — Inpatient Hospital Stay: Attending: Hematology and Oncology

## 2024-05-19 DIAGNOSIS — D509 Iron deficiency anemia, unspecified: Secondary | ICD-10-CM | POA: Insufficient documentation

## 2024-05-19 DIAGNOSIS — L650 Telogen effluvium: Secondary | ICD-10-CM | POA: Diagnosis not present

## 2024-05-19 DIAGNOSIS — L918 Other hypertrophic disorders of the skin: Secondary | ICD-10-CM | POA: Diagnosis not present

## 2024-05-19 DIAGNOSIS — L400 Psoriasis vulgaris: Secondary | ICD-10-CM | POA: Diagnosis not present

## 2024-05-19 DIAGNOSIS — L821 Other seborrheic keratosis: Secondary | ICD-10-CM | POA: Diagnosis not present

## 2024-05-19 DIAGNOSIS — C50411 Malignant neoplasm of upper-outer quadrant of right female breast: Secondary | ICD-10-CM | POA: Diagnosis not present

## 2024-05-19 DIAGNOSIS — Z170 Estrogen receptor positive status [ER+]: Secondary | ICD-10-CM

## 2024-05-19 LAB — CMP (CANCER CENTER ONLY)
ALT: 30 U/L (ref 0–44)
AST: 25 U/L (ref 15–41)
Albumin: 4.2 g/dL (ref 3.5–5.0)
Alkaline Phosphatase: 59 U/L (ref 38–126)
Anion gap: 6 (ref 5–15)
BUN: 15 mg/dL (ref 8–23)
CO2: 30 mmol/L (ref 22–32)
Calcium: 9.8 mg/dL (ref 8.9–10.3)
Chloride: 104 mmol/L (ref 98–111)
Creatinine: 1 mg/dL (ref 0.44–1.00)
GFR, Estimated: >60 mL/min (ref >60)
Glucose, Bld: 120 mg/dL — ABNORMAL HIGH (ref 70–99)
Potassium: 4.4 mmol/L (ref 3.5–5.1)
Sodium: 140 mmol/L (ref 135–145)
Total Bilirubin: 0.4 mg/dL (ref 0.0–1.2)
Total Protein: 7.2 g/dL (ref 6.5–8.1)

## 2024-05-19 LAB — CBC WITH DIFFERENTIAL (CANCER CENTER ONLY)
Abs Immature Granulocytes: 0.03 K/uL (ref 0.00–0.07)
Basophils Absolute: 0.1 K/uL (ref 0.0–0.1)
Basophils Relative: 1 %
Eosinophils Absolute: 0.2 K/uL (ref 0.0–0.5)
Eosinophils Relative: 3 %
HCT: 39.5 % (ref 36.0–46.0)
Hemoglobin: 13.4 g/dL (ref 12.0–15.0)
Immature Granulocytes: 0 %
Lymphocytes Relative: 42 %
Lymphs Abs: 2.9 K/uL (ref 0.7–4.0)
MCH: 31.2 pg (ref 26.0–34.0)
MCHC: 33.9 g/dL (ref 30.0–36.0)
MCV: 91.9 fL (ref 80.0–100.0)
Monocytes Absolute: 0.4 K/uL (ref 0.1–1.0)
Monocytes Relative: 6 %
Neutro Abs: 3.3 K/uL (ref 1.7–7.7)
Neutrophils Relative %: 48 %
Platelet Count: 219 K/uL (ref 150–400)
RBC: 4.3 MIL/uL (ref 3.87–5.11)
RDW: 13.1 % (ref 11.5–15.5)
WBC Count: 6.8 K/uL (ref 4.0–10.5)
nRBC: 0 % (ref 0.0–0.2)

## 2024-05-19 LAB — FERRITIN: Ferritin: 37 ng/mL (ref 11–307)

## 2024-05-19 LAB — IRON AND IRON BINDING CAPACITY (CC-WL,HP ONLY)
Iron: 78 ug/dL (ref 28–170)
Saturation Ratios: 22 % (ref 10.4–31.8)
TIBC: 360 ug/dL (ref 250–450)
UIBC: 282 ug/dL (ref 148–442)

## 2024-05-21 ENCOUNTER — Encounter: Payer: Self-pay | Admitting: Hematology and Oncology

## 2024-05-22 DIAGNOSIS — E6609 Other obesity due to excess calories: Secondary | ICD-10-CM | POA: Diagnosis not present

## 2024-05-22 DIAGNOSIS — E559 Vitamin D deficiency, unspecified: Secondary | ICD-10-CM | POA: Diagnosis not present

## 2024-05-22 DIAGNOSIS — Z6836 Body mass index (BMI) 36.0-36.9, adult: Secondary | ICD-10-CM | POA: Diagnosis not present

## 2024-05-22 DIAGNOSIS — M797 Fibromyalgia: Secondary | ICD-10-CM | POA: Diagnosis not present

## 2024-05-22 DIAGNOSIS — E785 Hyperlipidemia, unspecified: Secondary | ICD-10-CM | POA: Diagnosis not present

## 2024-06-02 ENCOUNTER — Other Ambulatory Visit: Payer: PPO

## 2024-06-03 ENCOUNTER — Other Ambulatory Visit: Payer: Self-pay | Admitting: *Deleted

## 2024-06-03 DIAGNOSIS — I82629 Acute embolism and thrombosis of deep veins of unspecified upper extremity: Secondary | ICD-10-CM

## 2024-06-03 DIAGNOSIS — I82623 Acute embolism and thrombosis of deep veins of upper extremity, bilateral: Secondary | ICD-10-CM

## 2024-06-03 DIAGNOSIS — I82622 Acute embolism and thrombosis of deep veins of left upper extremity: Secondary | ICD-10-CM

## 2024-06-03 MED ORDER — WARFARIN SODIUM 10 MG PO TABS: 10.0000 mg | ORAL_TABLET | Freq: Every day | ORAL | 1 refills | Status: DC

## 2024-06-03 MED ORDER — WARFARIN SODIUM 7.5 MG PO TABS: 7.5000 mg | ORAL_TABLET | Freq: Every day | ORAL | 3 refills | Status: DC

## 2024-06-03 NOTE — Assessment & Plan Note (Signed)
 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  Status post bilateral mastectomies at [facility], radiation, and chemotherapy CEF x5 cycles followed by radiation to the right chest   Current treatment: Anastrozole  started 2009 Anastrozole  toxicities: Denies any hot flashes or myalgias.   Breast cancer surveillance: 1.  06/04/2024: Breast examination: Bilateral mastectomies, no palpable lumps or nodules of concern 2.  No role of imaging studies since she had bilateral mastectomies Bone density done at [facility] February 2022: T score -1.3: Osteopenia  Patient wishes to stay on anastrozole  indefinitely.

## 2024-06-03 NOTE — Assessment & Plan Note (Signed)
 01/26/2022: Hemoglobin 10.4, MCV 81 05/31/2022: Ferritin 4, iron  saturation 4%, B12 4091, hemoglobin 10.1, MCV 89 (recommended IV iron  but patient wanted to watch and monitor) 08/21/2022: Hemoglobin 11.2, MCV 79, RDW 15.7, TIBC 404, iron  saturation 8%, ferritin 7 10/30/2022: Hemoglobin 11.2, MCV 75.6, RDW 19.2 06/25/2023: Hemoglobin 12.5, MCV 88.4, ferritin 8, iron  saturation 13% 12/03/23: Hemoglobin 14.1, MCV 92.8, Sat: 26%, Ferritin: 19 05/19/2024: Hemoglobin 13.4, MCV 91.9, iron  saturation 22%, ferritin 37   Will continue to watch and monitor her with labs every 6 months

## 2024-06-04 ENCOUNTER — Inpatient Hospital Stay: Payer: PPO | Admitting: Hematology and Oncology

## 2024-06-04 DIAGNOSIS — I82629 Acute embolism and thrombosis of deep veins of unspecified upper extremity: Secondary | ICD-10-CM

## 2024-06-04 DIAGNOSIS — D509 Iron deficiency anemia, unspecified: Secondary | ICD-10-CM | POA: Diagnosis not present

## 2024-06-04 DIAGNOSIS — Z170 Estrogen receptor positive status [ER+]: Secondary | ICD-10-CM | POA: Diagnosis not present

## 2024-06-04 DIAGNOSIS — I82622 Acute embolism and thrombosis of deep veins of left upper extremity: Secondary | ICD-10-CM | POA: Diagnosis not present

## 2024-06-04 DIAGNOSIS — I82623 Acute embolism and thrombosis of deep veins of upper extremity, bilateral: Secondary | ICD-10-CM

## 2024-06-04 DIAGNOSIS — C50411 Malignant neoplasm of upper-outer quadrant of right female breast: Secondary | ICD-10-CM

## 2024-06-04 MED ORDER — WARFARIN SODIUM 7.5 MG PO TABS: 7.5000 mg | ORAL_TABLET | Freq: Every day | ORAL | 3 refills | Status: AC

## 2024-06-04 MED ORDER — ANASTROZOLE 1 MG PO TABS: 1.0000 mg | ORAL_TABLET | Freq: Every day | ORAL | 3 refills | Status: AC

## 2024-06-04 MED ORDER — WARFARIN SODIUM 10 MG PO TABS: 10.0000 mg | ORAL_TABLET | Freq: Every day | ORAL | 3 refills | Status: AC

## 2024-06-04 MED ORDER — ERGOCALCIFEROL 1.25 MG (50000 UT) PO CAPS: 50000.0000 [IU] | ORAL_CAPSULE | ORAL | 3 refills | Status: AC

## 2024-06-04 NOTE — Progress Notes (Signed)
 HEMATOLOGY-ONCOLOGY TELEPHONE VISIT PROGRESS NOTE  I connected with our patient on 06/04/24 at 11:15 AM EDT by telephone and verified that I am speaking with the correct person using two identifiers.  I discussed the limitations, risks, security and privacy concerns of performing an evaluation and management service by telephone and the availability of in person appointments.  I also discussed with the patient that there may be a patient responsible charge related to this service. The patient expressed understanding and agreed to proceed.   History of Present Illness:  Telephone F/U of anemia and breast cancer  History of Present Illness Jessica Reilly is a 63 year old female with anemia and breast cancer who presents for follow-up regarding her anemia and medication management.  She is experiencing stress due to her family doctor's practice closing, which affects her ability to manage her prescriptions during the transition to a new primary care provider. Her hemoglobin and iron  studies were recently checked. She takes B12 and vitamin D  supplements and is proactive in ensuring her new doctor has all necessary information.  Her current medications include metformin , anastrozole , and warfarin. She follows a warfarin regimen of 7.5 mg on certain days and 10 mg on others. Her INR levels, previously at 1.2, have stabilized at 2.9 after discontinuing vitamin K2. She encountered an issue with her warfarin prescription being filled for only one month instead of three.  She has a history of breast cancer and underwent a double mastectomy. She is currently taking anastrozole , with a full year's worth of refills available. She has been advised to take prescription vitamin D  due to bone health issues and is taking a dissolvable B12 supplement without adverse effects.    Oncology History  Malignant neoplasm of upper-outer quadrant of right breast in female, estrogen receptor positive (HCC)  2008 Initial  Diagnosis   T2N0, ER PR positive, HER-2 negative, invasive ductal carcinoma of the right breast.    Status post bilateral mastectomies, radiation and antiestrogen therapy since 2009 treatment was done at [facility].   2009 -  Anti-estrogen oral therapy   Anastrozole  1 mg daily.  Plan is to treat her indefinitely.  Patient understands risks and benefits.  There is no data for long-term antiestrogen therapy but she is very anxious to come off of it.     REVIEW OF SYSTEMS:   Constitutional: Denies fevers, chills or abnormal weight loss All other systems were reviewed with the patient and are negative. 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  Status post bilateral mastectomies at [facility], radiation, and chemotherapy CEF x5 cycles followed by radiation to the right chest   Current treatment: Anastrozole  started 2009 Anastrozole  toxicities: Denies any hot flashes or myalgias.   Breast cancer surveillance: 1.  06/04/2024: Breast examination: Bilateral mastectomies, no palpable lumps or nodules of concern 2.  No role of imaging studies since she had bilateral mastectomies Bone density done at [facility] February 2022: T score -1.3: Osteopenia  Patient wishes to stay on anastrozole  indefinitely.  Iron  deficiency anemia 01/26/2022: Hemoglobin 10.4, MCV 81 05/31/2022: Ferritin 4, iron  saturation 4%, B12 4091, hemoglobin 10.1, MCV 89 (recommended IV iron  but patient wanted to watch and monitor) 08/21/2022: Hemoglobin 11.2, MCV 79, RDW 15.7, TIBC 404, iron  saturation 8%, ferritin 7 10/30/2022: Hemoglobin 11.2, MCV 75.6, RDW 19.2 06/25/2023: Hemoglobin  12.5, MCV 88.4, ferritin 8, iron  saturation 13% 12/03/23: Hemoglobin 14.1, MCV 92.8, Sat: 26%, Ferritin: 19 05/19/2024: Hemoglobin 13.4,  MCV 91.9, iron  saturation 22%, ferritin 37   IV Iron : May 2023  Will continue to watch and monitor her with labs once a year      Assessment & Plan Estrogen receptor positive malignant neoplasm of right breast, status post double mastectomy, on anastrozole  Status post double mastectomy for estrogen receptor positive malignant neoplasm of the right breast. Currently on anastrozole  with a full year's worth of refills available. - Continue anastrozole  with current prescription refills.  Iron  deficiency anemia Iron  deficiency anemia is well-managed. Recent hemoglobin and iron  studies are satisfactory.  Anticoagulation management for history of upper extremity deep vein thrombosis On warfarin for anticoagulation management due to upper extremity deep vein thrombosis. Recent INR levels dropped to 1.2, potentially due to vitamin K2 supplementation. Discontinuation of K2 improved INR to 2.9. - Send a 90-day supply prescription for warfarin 7.5 mg and 10 mg tablets.  Generalized anxiety disorder Generalized anxiety disorder with concern about running out of Xanax, which she has been taking for years. Transitioning to a new family doctor and worried about obtaining prescriptions during this period.  Vitamin D  deficiency Advised to take vitamin D  for bone health. Issues with over-the-counter vitamin D  affecting INR levels. Prescription vitamin D  considered to avoid further complications. - Send a prescription for vitamin D  50,000 IU once a week.      I discussed the assessment and treatment plan with the patient. The patient was provided an opportunity to ask questions and all were answered. The patient agreed with the plan and demonstrated an understanding of the instructions. The patient was advised to call back or seek an in-person evaluation if the symptoms worsen or if the condition fails to improve as anticipated.   I provided 20 minutes of non-face-to-face time during this encounter.   This includes time for charting and coordination of care   Naomi MARLA Chad, MD

## 2024-06-05 DIAGNOSIS — M545 Low back pain, unspecified: Secondary | ICD-10-CM | POA: Diagnosis not present

## 2024-06-05 DIAGNOSIS — M48061 Spinal stenosis, lumbar region without neurogenic claudication: Secondary | ICD-10-CM | POA: Diagnosis not present

## 2024-06-05 DIAGNOSIS — M5416 Radiculopathy, lumbar region: Secondary | ICD-10-CM | POA: Diagnosis not present

## 2024-06-09 DIAGNOSIS — M5416 Radiculopathy, lumbar region: Secondary | ICD-10-CM | POA: Diagnosis not present

## 2024-06-09 DIAGNOSIS — M48061 Spinal stenosis, lumbar region without neurogenic claudication: Secondary | ICD-10-CM | POA: Diagnosis not present

## 2024-06-09 DIAGNOSIS — M545 Low back pain, unspecified: Secondary | ICD-10-CM | POA: Diagnosis not present

## 2024-07-01 DIAGNOSIS — M25562 Pain in left knee: Secondary | ICD-10-CM | POA: Diagnosis not present

## 2024-07-01 DIAGNOSIS — M25561 Pain in right knee: Secondary | ICD-10-CM | POA: Diagnosis not present

## 2024-07-02 DIAGNOSIS — Z7689 Persons encountering health services in other specified circumstances: Secondary | ICD-10-CM | POA: Diagnosis not present

## 2024-07-02 DIAGNOSIS — F32A Depression, unspecified: Secondary | ICD-10-CM | POA: Diagnosis not present

## 2024-07-02 DIAGNOSIS — E66812 Obesity, class 2: Secondary | ICD-10-CM | POA: Diagnosis not present

## 2024-07-02 DIAGNOSIS — E1165 Type 2 diabetes mellitus with hyperglycemia: Secondary | ICD-10-CM | POA: Diagnosis not present

## 2024-07-02 DIAGNOSIS — D6851 Activated protein C resistance: Secondary | ICD-10-CM | POA: Diagnosis not present

## 2024-07-02 DIAGNOSIS — L659 Nonscarring hair loss, unspecified: Secondary | ICD-10-CM | POA: Diagnosis not present

## 2024-07-02 DIAGNOSIS — E782 Mixed hyperlipidemia: Secondary | ICD-10-CM | POA: Diagnosis not present

## 2024-07-02 DIAGNOSIS — C50411 Malignant neoplasm of upper-outer quadrant of right female breast: Secondary | ICD-10-CM | POA: Diagnosis not present

## 2024-07-02 DIAGNOSIS — Z170 Estrogen receptor positive status [ER+]: Secondary | ICD-10-CM | POA: Diagnosis not present

## 2024-07-02 DIAGNOSIS — N951 Menopausal and female climacteric states: Secondary | ICD-10-CM | POA: Diagnosis not present

## 2024-07-02 DIAGNOSIS — F419 Anxiety disorder, unspecified: Secondary | ICD-10-CM | POA: Diagnosis not present

## 2024-07-02 DIAGNOSIS — I10 Essential (primary) hypertension: Secondary | ICD-10-CM | POA: Diagnosis not present

## 2024-07-06 DIAGNOSIS — M545 Low back pain, unspecified: Secondary | ICD-10-CM | POA: Diagnosis not present

## 2024-07-06 DIAGNOSIS — M48061 Spinal stenosis, lumbar region without neurogenic claudication: Secondary | ICD-10-CM | POA: Diagnosis not present

## 2024-07-06 DIAGNOSIS — M5416 Radiculopathy, lumbar region: Secondary | ICD-10-CM | POA: Diagnosis not present

## 2024-07-07 ENCOUNTER — Encounter: Payer: Self-pay | Admitting: Hematology and Oncology

## 2024-07-09 DIAGNOSIS — D6851 Activated protein C resistance: Secondary | ICD-10-CM | POA: Diagnosis not present

## 2024-07-09 DIAGNOSIS — E66812 Obesity, class 2: Secondary | ICD-10-CM | POA: Diagnosis not present

## 2024-07-09 DIAGNOSIS — F419 Anxiety disorder, unspecified: Secondary | ICD-10-CM | POA: Diagnosis not present

## 2024-07-09 DIAGNOSIS — M545 Low back pain, unspecified: Secondary | ICD-10-CM | POA: Diagnosis not present

## 2024-07-09 DIAGNOSIS — C50411 Malignant neoplasm of upper-outer quadrant of right female breast: Secondary | ICD-10-CM | POA: Diagnosis not present

## 2024-07-09 DIAGNOSIS — Z170 Estrogen receptor positive status [ER+]: Secondary | ICD-10-CM | POA: Diagnosis not present

## 2024-07-09 DIAGNOSIS — E782 Mixed hyperlipidemia: Secondary | ICD-10-CM | POA: Diagnosis not present

## 2024-07-09 DIAGNOSIS — M5416 Radiculopathy, lumbar region: Secondary | ICD-10-CM | POA: Diagnosis not present

## 2024-07-09 DIAGNOSIS — M48061 Spinal stenosis, lumbar region without neurogenic claudication: Secondary | ICD-10-CM | POA: Diagnosis not present

## 2024-07-09 DIAGNOSIS — Z7689 Persons encountering health services in other specified circumstances: Secondary | ICD-10-CM | POA: Diagnosis not present

## 2024-07-09 DIAGNOSIS — Z7984 Long term (current) use of oral hypoglycemic drugs: Secondary | ICD-10-CM | POA: Diagnosis not present

## 2024-07-09 DIAGNOSIS — I10 Essential (primary) hypertension: Secondary | ICD-10-CM | POA: Diagnosis not present

## 2024-07-09 DIAGNOSIS — F32A Depression, unspecified: Secondary | ICD-10-CM | POA: Diagnosis not present

## 2024-07-09 DIAGNOSIS — E1165 Type 2 diabetes mellitus with hyperglycemia: Secondary | ICD-10-CM | POA: Diagnosis not present

## 2024-07-09 DIAGNOSIS — F3181 Bipolar II disorder: Secondary | ICD-10-CM | POA: Diagnosis not present

## 2024-07-23 ENCOUNTER — Encounter (INDEPENDENT_AMBULATORY_CARE_PROVIDER_SITE_OTHER): Payer: Self-pay | Admitting: Gastroenterology

## 2024-07-28 ENCOUNTER — Encounter: Payer: Self-pay | Admitting: Hematology and Oncology

## 2024-08-04 DIAGNOSIS — R110 Nausea: Secondary | ICD-10-CM | POA: Diagnosis not present

## 2024-08-04 DIAGNOSIS — Z79899 Other long term (current) drug therapy: Secondary | ICD-10-CM | POA: Diagnosis not present

## 2024-08-04 DIAGNOSIS — F3181 Bipolar II disorder: Secondary | ICD-10-CM | POA: Diagnosis not present

## 2024-08-04 DIAGNOSIS — F32A Depression, unspecified: Secondary | ICD-10-CM | POA: Diagnosis not present

## 2024-08-04 DIAGNOSIS — F419 Anxiety disorder, unspecified: Secondary | ICD-10-CM | POA: Diagnosis not present

## 2024-08-04 DIAGNOSIS — I10 Essential (primary) hypertension: Secondary | ICD-10-CM | POA: Diagnosis not present

## 2024-08-04 DIAGNOSIS — J3489 Other specified disorders of nose and nasal sinuses: Secondary | ICD-10-CM | POA: Diagnosis not present

## 2024-08-05 DIAGNOSIS — M48061 Spinal stenosis, lumbar region without neurogenic claudication: Secondary | ICD-10-CM | POA: Diagnosis not present

## 2024-08-05 DIAGNOSIS — M5416 Radiculopathy, lumbar region: Secondary | ICD-10-CM | POA: Diagnosis not present

## 2024-08-09 DIAGNOSIS — M5416 Radiculopathy, lumbar region: Secondary | ICD-10-CM | POA: Diagnosis not present

## 2024-08-09 DIAGNOSIS — M48061 Spinal stenosis, lumbar region without neurogenic claudication: Secondary | ICD-10-CM | POA: Diagnosis not present

## 2024-09-05 DIAGNOSIS — M48061 Spinal stenosis, lumbar region without neurogenic claudication: Secondary | ICD-10-CM | POA: Diagnosis not present

## 2024-09-05 DIAGNOSIS — M5416 Radiculopathy, lumbar region: Secondary | ICD-10-CM | POA: Diagnosis not present

## 2024-09-08 DIAGNOSIS — M5416 Radiculopathy, lumbar region: Secondary | ICD-10-CM | POA: Diagnosis not present

## 2024-09-08 DIAGNOSIS — M48061 Spinal stenosis, lumbar region without neurogenic claudication: Secondary | ICD-10-CM | POA: Diagnosis not present

## 2024-10-07 ENCOUNTER — Encounter: Payer: Self-pay | Admitting: Hematology and Oncology

## 2024-10-10 NOTE — Progress Notes (Signed)
 Jessica Reilly                                          MRN: [id number]   10/10/2024   The [facility] Quality Team Specialist reviewed this patient medical record for the purposes of chart review for care gap closure. The following were reviewed: abstraction for care gap closure-glycemic status assessment.    [facility] Quality Team

## 2024-10-15 ENCOUNTER — Telehealth: Payer: Self-pay | Admitting: *Deleted

## 2024-10-15 NOTE — Telephone Encounter (Signed)
 Received call from pt stating she is needing a cortisone injection in her lower back for ongoing back pain.  Pt states [facility] is requesting advice on Warfarin.  RN provided pt our fax number for [facility] to fax medical clearance for MD to review and make recommendations on Warfarin.

## 2024-10-16 ENCOUNTER — Encounter: Payer: Self-pay | Admitting: *Deleted

## 2024-10-16 NOTE — Progress Notes (Signed)
 Received medical clearance from [facility] stating pt is needing L5-S1 interlaminar injection.  Per MD since pt is on Warfarin, she will need to hold Warfarin 3 days prior to injection and resume day after.  Medical clearance faxed to [phone] and pt notified and verbalized understanding.

## 2025-06-05 ENCOUNTER — Other Ambulatory Visit

## 2025-06-08 ENCOUNTER — Telehealth: Admitting: Hematology and Oncology
